# Patient Record
Sex: Male | Born: 1955 | Race: Black or African American | Hispanic: No | Smoking: Former smoker
Health system: Southern US, Community
[De-identification: ages and names within clinical notes are randomized; demographics above are authoritative.]

## PROBLEM LIST (undated history)

## (undated) DIAGNOSIS — M25569 Pain in unspecified knee: Secondary | ICD-10-CM

## (undated) DIAGNOSIS — N189 Chronic kidney disease, unspecified: Secondary | ICD-10-CM

## (undated) DIAGNOSIS — G4730 Sleep apnea, unspecified: Secondary | ICD-10-CM

## (undated) DIAGNOSIS — I10 Essential (primary) hypertension: Secondary | ICD-10-CM

## (undated) DIAGNOSIS — M549 Dorsalgia, unspecified: Secondary | ICD-10-CM

## (undated) DIAGNOSIS — E119 Type 2 diabetes mellitus without complications: Secondary | ICD-10-CM

## (undated) DIAGNOSIS — M13849 Other specified arthritis, unspecified hand: Secondary | ICD-10-CM

## (undated) DIAGNOSIS — I350 Nonrheumatic aortic (valve) stenosis: Secondary | ICD-10-CM

## (undated) DIAGNOSIS — R0681 Apnea, not elsewhere classified: Secondary | ICD-10-CM

## (undated) DIAGNOSIS — I499 Cardiac arrhythmia, unspecified: Secondary | ICD-10-CM

## (undated) DIAGNOSIS — K219 Gastro-esophageal reflux disease without esophagitis: Secondary | ICD-10-CM

## (undated) DIAGNOSIS — D649 Anemia, unspecified: Secondary | ICD-10-CM

## (undated) DIAGNOSIS — G8929 Other chronic pain: Secondary | ICD-10-CM

## (undated) DIAGNOSIS — J45909 Unspecified asthma, uncomplicated: Secondary | ICD-10-CM

## (undated) DIAGNOSIS — G5600 Carpal tunnel syndrome, unspecified upper limb: Secondary | ICD-10-CM

## (undated) HISTORY — PX: BACK SURGERY: SHX140

---

## 2014-06-08 ENCOUNTER — Emergency Department (HOSPITAL_BASED_OUTPATIENT_CLINIC_OR_DEPARTMENT_OTHER)
Admission: EM | Admit: 2014-06-08 | Discharge: 2014-06-08 | Disposition: A | Discharge status: Home / Self Care | Payer: Medicare HMO | Attending: Emergency Medicine | Admitting: Emergency Medicine

## 2014-06-08 ENCOUNTER — Emergency Department (HOSPITAL_BASED_OUTPATIENT_CLINIC_OR_DEPARTMENT_OTHER): Payer: Medicare HMO

## 2014-06-08 ENCOUNTER — Encounter (HOSPITAL_BASED_OUTPATIENT_CLINIC_OR_DEPARTMENT_OTHER): Payer: Self-pay | Admitting: Emergency Medicine

## 2014-06-08 DIAGNOSIS — R079 Chest pain, unspecified: Secondary | ICD-10-CM | POA: Insufficient documentation

## 2014-06-08 DIAGNOSIS — R059 Cough, unspecified: Secondary | ICD-10-CM | POA: Insufficient documentation

## 2014-06-08 DIAGNOSIS — G8929 Other chronic pain: Secondary | ICD-10-CM | POA: Diagnosis not present

## 2014-06-08 DIAGNOSIS — R0602 Shortness of breath: Secondary | ICD-10-CM | POA: Diagnosis not present

## 2014-06-08 DIAGNOSIS — R05 Cough: Secondary | ICD-10-CM | POA: Insufficient documentation

## 2014-06-08 DIAGNOSIS — M7989 Other specified soft tissue disorders: Secondary | ICD-10-CM | POA: Insufficient documentation

## 2014-06-08 DIAGNOSIS — E119 Type 2 diabetes mellitus without complications: Secondary | ICD-10-CM | POA: Diagnosis not present

## 2014-06-08 DIAGNOSIS — J159 Unspecified bacterial pneumonia: Secondary | ICD-10-CM | POA: Diagnosis not present

## 2014-06-08 DIAGNOSIS — R51 Headache: Secondary | ICD-10-CM | POA: Diagnosis not present

## 2014-06-08 DIAGNOSIS — I10 Essential (primary) hypertension: Secondary | ICD-10-CM | POA: Diagnosis not present

## 2014-06-08 DIAGNOSIS — J189 Pneumonia, unspecified organism: Secondary | ICD-10-CM

## 2014-06-08 HISTORY — DX: Type 2 diabetes mellitus without complications: E11.9

## 2014-06-08 HISTORY — DX: Dorsalgia, unspecified: M54.9

## 2014-06-08 HISTORY — DX: Apnea, not elsewhere classified: R06.81

## 2014-06-08 HISTORY — DX: Other chronic pain: G89.29

## 2014-06-08 HISTORY — DX: Essential (primary) hypertension: I10

## 2014-06-08 HISTORY — DX: Pain in unspecified knee: M25.569

## 2014-06-08 LAB — CBC WITH DIFFERENTIAL/PLATELET
Basophils Absolute: 0 10*3/uL (ref 0.0–0.1)
Basophils Relative: 0 % (ref 0–1)
Eosinophils Absolute: 0.4 10*3/uL (ref 0.0–0.7)
Eosinophils Relative: 4 % (ref 0–5)
HCT: 40.4 % (ref 39.0–52.0)
Hemoglobin: 12.5 g/dL — ABNORMAL LOW (ref 13.0–17.0)
LYMPHS ABS: 1.1 10*3/uL (ref 0.7–4.0)
Lymphocytes Relative: 13 % (ref 12–46)
MCH: 27.3 pg (ref 26.0–34.0)
MCHC: 30.9 g/dL (ref 30.0–36.0)
MCV: 88.2 fL (ref 78.0–100.0)
MONOS PCT: 7 % (ref 3–12)
Monocytes Absolute: 0.6 10*3/uL (ref 0.1–1.0)
NEUTROS ABS: 6.6 10*3/uL (ref 1.7–7.7)
Neutrophils Relative %: 76 % (ref 43–77)
PLATELETS: 275 10*3/uL (ref 150–400)
RBC: 4.58 MIL/uL (ref 4.22–5.81)
RDW: 14 % (ref 11.5–15.5)
WBC: 8.7 10*3/uL (ref 4.0–10.5)

## 2014-06-08 LAB — BASIC METABOLIC PANEL
ANION GAP: 13 (ref 5–15)
BUN: 14 mg/dL (ref 6–23)
CHLORIDE: 96 meq/L (ref 96–112)
CO2: 32 mEq/L (ref 19–32)
Calcium: 9.7 mg/dL (ref 8.4–10.5)
Creatinine, Ser: 0.8 mg/dL (ref 0.50–1.35)
GFR calc non Af Amer: >90 mL/min (ref >90)
Glucose, Bld: 121 mg/dL — ABNORMAL HIGH (ref 70–99)
Potassium: 3.8 mEq/L (ref 3.7–5.3)
SODIUM: 141 meq/L (ref 137–147)

## 2014-06-08 LAB — PRO B NATRIURETIC PEPTIDE: PRO B NATRI PEPTIDE: 54.4 pg/mL (ref 0–125)

## 2014-06-08 LAB — TROPONIN I

## 2014-06-08 MED ORDER — LEVOFLOXACIN 750 MG PO TABS
750.0000 mg | ORAL_TABLET | Freq: Once | ORAL | Status: AC
  Administered 2014-06-08: 750 mg via ORAL

## 2014-06-08 MED ORDER — LEVOFLOXACIN 750 MG PO TABS: 750.0000 mg | ORAL_TABLET | Freq: Every day | ORAL | Status: DC

## 2014-06-08 MED ORDER — LEVOFLOXACIN 750 MG PO TABS
ORAL_TABLET | ORAL | Status: DC
Start: 2014-06-08 — End: 2014-06-08
  Filled 2014-06-08: qty 1

## 2014-06-08 MED ORDER — IPRATROPIUM-ALBUTEROL 0.5-2.5 (3) MG/3ML IN SOLN
3.0000 mL | Freq: Once | RESPIRATORY_TRACT | Status: AC
  Administered 2014-06-08: 3 mL via RESPIRATORY_TRACT
  Filled 2014-06-08: qty 3

## 2014-06-08 NOTE — ED Provider Notes (Signed)
CSN: [id number]     Arrival date & time 06/08/14  1243 History   First MD Initiated Contact with Patient 06/08/14 1358     Chief Complaint  Patient presents with  . Cough     (Consider location/radiation/quality/duration/timing/severity/associated sxs/prior Treatment) HPI 58 year old male presents with cough for the past 3 days. He's been having headache and muscle aches as well. He started having shortness of breath today. Some orthopnea last night. He has chronic lower extremity swelling but is not worse. No fevers or chills. He started having chest pain when coughing. No chest pain without coughing. This feels similar to when he had pneumonia in the past.   Past Medical History  Diagnosis Date  . Diabetes mellitus without complication   . Apnea   . Chronic back pain   . Hypertension   . Chronic knee pain    Past Surgical History  Procedure Laterality Date  . Back surgery     No family history on file. History  Substance Use Topics  . Smoking status: Never Smoker   . Smokeless tobacco: Not on file  . Alcohol Use: No    Review of Systems  Constitutional: Negative for fever and chills.  Respiratory: Positive for cough and shortness of breath. Negative for wheezing.   Cardiovascular: Positive for chest pain and leg swelling.  Gastrointestinal: Negative for nausea and abdominal pain.  Neurological: Positive for headaches.  All other systems reviewed and are negative.     Allergies  Chocolate; Fish allergy; and Sulfa antibiotics  Home Medications   Prior to Admission medications   Not on File   BP 159/86  Pulse 100  Temp(Src) 98.4 F (36.9 C) (Oral)  Resp 24  Ht 6' (1.829 m)  Wt 347 lb (157.398 kg)  BMI 47.05 kg/m2  SpO2 92% Physical Exam  Nursing note and vitals reviewed. Constitutional: He is oriented to person, place, and time. He appears well-developed and well-nourished.  Morbidly obese  HENT:  Head: Normocephalic and atraumatic.  Right Ear:  External ear normal.  Left Ear: External ear normal.  Nose: Nose normal.  Eyes: Right eye exhibits no discharge. Left eye exhibits no discharge.  Neck: Neck supple.  Cardiovascular: Normal rate, regular rhythm, normal heart sounds and intact distal pulses.   Pulmonary/Chest: Effort normal. He has no wheezes. He has rales (bibasilar rales - mild).  Abdominal: Soft. He exhibits no distension. There is no tenderness.  Musculoskeletal: He exhibits no edema (no pitting edema).  Neurological: He is alert and oriented to person, place, and time.  Skin: Skin is warm and dry.    ED Course  Procedures (including critical care time) Labs Review Labs Reviewed  CBC WITH DIFFERENTIAL - Abnormal; Notable for the following:    Hemoglobin 12.5 (*)    All other components within normal limits  BASIC METABOLIC PANEL - Abnormal; Notable for the following:    Glucose, Bld 121 (*)    All other components within normal limits  TROPONIN I  PRO B NATRIURETIC PEPTIDE    Imaging Review Dg Chest 2 View  06/08/2014   CLINICAL DATA:  Productive cough, chest pain, shortness of breath  EXAM: CHEST - 2 VIEW  COMPARISON:  None.  FINDINGS: Mild cardiac enlargement with central vascular congestion and basilar atelectasis. Stable pleural thickening bilaterally. No new edema, focal pneumonia, collapse or consolidation. No effusion. Stable pleural thickening in the apices. Trachea is midline.  IMPRESSION: Stable chest exam.  No superimposed acute process.   Electronically Signed  By: Ruel Favors M.D.   On: 06/08/2014 13:48     EKG Interpretation   Date/Time:  Sunday June 08 2014 14:31:58 EDT Ventricular Rate:  102 PR Interval:  160 QRS Duration: 74 QT Interval:  352 QTC Calculation: 458 R Axis:   57 Text Interpretation:  Sinus tachycardia Nonspecific T wave abnormality  Abnormal ECG No old tracing to compare Confirmed by Micaila Ziemba  MD, Makale Pindell  ([id number]) on 06/08/2014 3:02:31 PM      MDM   Final diagnoses:   Community acquired pneumonia    Patient with mild bibasilar Rales. Clinically he appears to have been acquired pneumonia. His chest pain appears to be of chest wall etiology from his coughing. No evidence of heart failure. He has borderline oxygen saturations between 92-95%. Seems consistent with his pneumonia. He has no increased work of breathing. Albuterol was tried without any relief due to his prior history of asthma. At this point he is well-appearing and I feel he is stable for outpatient management and advised of strict return precautions.    Audree Camel, MD 06/08/14 [phone]

## 2014-06-08 NOTE — Discharge Instructions (Signed)

## 2014-06-08 NOTE — ED Notes (Signed)
Pt here for productive cough x3 days with clear sputum.  Pt denies any fever or chills.  Pt reports sob and HA with this as well as CP when coughing.  Has ble edema but states that this is normal for him.

## 2014-06-08 NOTE — ED Notes (Signed)
Pt was 87% on RA after walking to room and tells me that he cant lay down due to sob!

## 2020-08-15 ENCOUNTER — Emergency Department (HOSPITAL_BASED_OUTPATIENT_CLINIC_OR_DEPARTMENT_OTHER): Payer: Medicare HMO

## 2020-08-15 ENCOUNTER — Inpatient Hospital Stay (HOSPITAL_BASED_OUTPATIENT_CLINIC_OR_DEPARTMENT_OTHER)
Admission: EM | Admit: 2020-08-15 | Discharge: 2020-08-23 | DRG: 286 | Disposition: A | Discharge status: Home / Self Care | Payer: Medicare HMO | Attending: Internal Medicine | Admitting: Internal Medicine

## 2020-08-15 ENCOUNTER — Encounter (HOSPITAL_BASED_OUTPATIENT_CLINIC_OR_DEPARTMENT_OTHER): Payer: Self-pay | Admitting: *Deleted

## 2020-08-15 ENCOUNTER — Other Ambulatory Visit: Payer: Self-pay

## 2020-08-15 DIAGNOSIS — N179 Acute kidney failure, unspecified: Secondary | ICD-10-CM | POA: Diagnosis present

## 2020-08-15 DIAGNOSIS — E785 Hyperlipidemia, unspecified: Secondary | ICD-10-CM | POA: Diagnosis present

## 2020-08-15 DIAGNOSIS — Z882 Allergy status to sulfonamides status: Secondary | ICD-10-CM

## 2020-08-15 DIAGNOSIS — N141 Nephropathy induced by other drugs, medicaments and biological substances: Secondary | ICD-10-CM | POA: Diagnosis present

## 2020-08-15 DIAGNOSIS — I130 Hypertensive heart and chronic kidney disease with heart failure and stage 1 through stage 4 chronic kidney disease, or unspecified chronic kidney disease: Secondary | ICD-10-CM | POA: Diagnosis not present

## 2020-08-15 DIAGNOSIS — I342 Nonrheumatic mitral (valve) stenosis: Secondary | ICD-10-CM

## 2020-08-15 DIAGNOSIS — J45901 Unspecified asthma with (acute) exacerbation: Secondary | ICD-10-CM | POA: Diagnosis present

## 2020-08-15 DIAGNOSIS — D638 Anemia in other chronic diseases classified elsewhere: Secondary | ICD-10-CM | POA: Diagnosis present

## 2020-08-15 DIAGNOSIS — K219 Gastro-esophageal reflux disease without esophagitis: Secondary | ICD-10-CM | POA: Diagnosis present

## 2020-08-15 DIAGNOSIS — Z23 Encounter for immunization: Secondary | ICD-10-CM

## 2020-08-15 DIAGNOSIS — N183 Chronic kidney disease, stage 3 unspecified: Secondary | ICD-10-CM

## 2020-08-15 DIAGNOSIS — Z20822 Contact with and (suspected) exposure to covid-19: Secondary | ICD-10-CM | POA: Diagnosis present

## 2020-08-15 DIAGNOSIS — Y929 Unspecified place or not applicable: Secondary | ICD-10-CM

## 2020-08-15 DIAGNOSIS — Z7984 Long term (current) use of oral hypoglycemic drugs: Secondary | ICD-10-CM

## 2020-08-15 DIAGNOSIS — Z952 Presence of prosthetic heart valve: Secondary | ICD-10-CM

## 2020-08-15 DIAGNOSIS — J9601 Acute respiratory failure with hypoxia: Principal | ICD-10-CM | POA: Diagnosis present

## 2020-08-15 DIAGNOSIS — I350 Nonrheumatic aortic (valve) stenosis: Secondary | ICD-10-CM | POA: Diagnosis present

## 2020-08-15 DIAGNOSIS — G4733 Obstructive sleep apnea (adult) (pediatric): Secondary | ICD-10-CM | POA: Diagnosis present

## 2020-08-15 DIAGNOSIS — N1831 Chronic kidney disease, stage 3a: Secondary | ICD-10-CM | POA: Diagnosis present

## 2020-08-15 DIAGNOSIS — I5031 Acute diastolic (congestive) heart failure: Secondary | ICD-10-CM | POA: Diagnosis present

## 2020-08-15 DIAGNOSIS — I2510 Atherosclerotic heart disease of native coronary artery without angina pectoris: Secondary | ICD-10-CM | POA: Diagnosis present

## 2020-08-15 DIAGNOSIS — I214 Non-ST elevation (NSTEMI) myocardial infarction: Secondary | ICD-10-CM

## 2020-08-15 DIAGNOSIS — Z91013 Allergy to seafood: Secondary | ICD-10-CM

## 2020-08-15 DIAGNOSIS — Z79899 Other long term (current) drug therapy: Secondary | ICD-10-CM

## 2020-08-15 DIAGNOSIS — I272 Pulmonary hypertension, unspecified: Secondary | ICD-10-CM | POA: Diagnosis present

## 2020-08-15 DIAGNOSIS — M170 Bilateral primary osteoarthritis of knee: Secondary | ICD-10-CM | POA: Diagnosis present

## 2020-08-15 DIAGNOSIS — Z7982 Long term (current) use of aspirin: Secondary | ICD-10-CM

## 2020-08-15 DIAGNOSIS — A419 Sepsis, unspecified organism: Secondary | ICD-10-CM

## 2020-08-15 DIAGNOSIS — Z9989 Dependence on other enabling machines and devices: Secondary | ICD-10-CM

## 2020-08-15 DIAGNOSIS — R0602 Shortness of breath: Secondary | ICD-10-CM

## 2020-08-15 DIAGNOSIS — J9811 Atelectasis: Secondary | ICD-10-CM | POA: Diagnosis present

## 2020-08-15 DIAGNOSIS — E0590 Thyrotoxicosis, unspecified without thyrotoxic crisis or storm: Secondary | ICD-10-CM | POA: Diagnosis present

## 2020-08-15 DIAGNOSIS — E1122 Type 2 diabetes mellitus with diabetic chronic kidney disease: Secondary | ICD-10-CM | POA: Diagnosis present

## 2020-08-15 DIAGNOSIS — Z6841 Body Mass Index (BMI) 40.0 and over, adult: Secondary | ICD-10-CM

## 2020-08-15 DIAGNOSIS — Z951 Presence of aortocoronary bypass graft: Secondary | ICD-10-CM

## 2020-08-15 DIAGNOSIS — E860 Dehydration: Secondary | ICD-10-CM | POA: Diagnosis present

## 2020-08-15 DIAGNOSIS — J441 Chronic obstructive pulmonary disease with (acute) exacerbation: Secondary | ICD-10-CM | POA: Diagnosis present

## 2020-08-15 DIAGNOSIS — T508X5A Adverse effect of diagnostic agents, initial encounter: Secondary | ICD-10-CM | POA: Diagnosis present

## 2020-08-15 HISTORY — DX: Sleep apnea, unspecified: G47.30

## 2020-08-15 LAB — BASIC METABOLIC PANEL
Anion gap: 11 (ref 5–15)
BUN: 17 mg/dL (ref 8–23)
CO2: 29 mmol/L (ref 22–32)
Calcium: 9.2 mg/dL (ref 8.9–10.3)
Chloride: 100 mmol/L (ref 98–111)
Creatinine, Ser: 1.38 mg/dL — ABNORMAL HIGH (ref 0.61–1.24)
GFR, Estimated: 56 mL/min — ABNORMAL LOW (ref >60)
Glucose, Bld: 120 mg/dL — ABNORMAL HIGH (ref 70–99)
Potassium: 4.4 mmol/L (ref 3.5–5.1)
Sodium: 140 mmol/L (ref 135–145)

## 2020-08-15 LAB — CBC WITH DIFFERENTIAL/PLATELET
Abs Immature Granulocytes: 0.02 10*3/uL (ref 0.00–0.07)
Basophils Absolute: 0.1 10*3/uL (ref 0.0–0.1)
Basophils Relative: 1 %
Eosinophils Absolute: 0.1 10*3/uL (ref 0.0–0.5)
Eosinophils Relative: 1 %
HCT: 34 % — ABNORMAL LOW (ref 39.0–52.0)
Hemoglobin: 10.1 g/dL — ABNORMAL LOW (ref 13.0–17.0)
Immature Granulocytes: 0 %
Lymphocytes Relative: 14 %
Lymphs Abs: 1.2 10*3/uL (ref 0.7–4.0)
MCH: 25.1 pg — ABNORMAL LOW (ref 26.0–34.0)
MCHC: 29.7 g/dL — ABNORMAL LOW (ref 30.0–36.0)
MCV: 84.4 fL (ref 80.0–100.0)
Monocytes Absolute: 0.8 10*3/uL (ref 0.1–1.0)
Monocytes Relative: 10 %
Neutro Abs: 6.2 10*3/uL (ref 1.7–7.7)
Neutrophils Relative %: 74 %
Platelets: 306 10*3/uL (ref 150–400)
RBC: 4.03 MIL/uL — ABNORMAL LOW (ref 4.22–5.81)
RDW: 15.4 % (ref 11.5–15.5)
WBC: 8.4 10*3/uL (ref 4.0–10.5)
nRBC: 0 % (ref 0.0–0.2)

## 2020-08-15 LAB — RESPIRATORY PANEL BY RT PCR (FLU A&B, COVID)
Influenza A by PCR: NEGATIVE
Influenza B by PCR: NEGATIVE
SARS Coronavirus 2 by RT PCR: NEGATIVE

## 2020-08-15 LAB — HEPATIC FUNCTION PANEL
ALT: 16 U/L (ref 0–44)
AST: 17 U/L (ref 15–41)
Albumin: 3.7 g/dL (ref 3.5–5.0)
Alkaline Phosphatase: 50 U/L (ref 38–126)
Bilirubin, Direct: 0.1 mg/dL (ref 0.0–0.2)
Indirect Bilirubin: 0.3 mg/dL (ref 0.3–0.9)
Total Bilirubin: 0.4 mg/dL (ref 0.3–1.2)
Total Protein: 7.7 g/dL (ref 6.5–8.1)

## 2020-08-15 LAB — BRAIN NATRIURETIC PEPTIDE: B Natriuretic Peptide: 152.3 pg/mL — ABNORMAL HIGH (ref 0.0–100.0)

## 2020-08-15 LAB — TROPONIN I (HIGH SENSITIVITY)
Troponin I (High Sensitivity): 350 ng/L (ref <18)
Troponin I (High Sensitivity): 402 ng/L (ref <18)

## 2020-08-15 MED ORDER — HEPARIN BOLUS VIA INFUSION
4000.0000 [IU] | Freq: Once | INTRAVENOUS | Status: AC
  Administered 2020-08-15: 4000 [IU] via INTRAVENOUS

## 2020-08-15 MED ORDER — ASPIRIN 81 MG PO CHEW
324.0000 mg | CHEWABLE_TABLET | Freq: Once | ORAL | Status: AC
  Administered 2020-08-15: 324 mg via ORAL
  Filled 2020-08-15: qty 4

## 2020-08-15 MED ORDER — FUROSEMIDE 10 MG/ML IJ SOLN
20.0000 mg | Freq: Once | INTRAMUSCULAR | Status: AC
  Administered 2020-08-15: 20 mg via INTRAVENOUS
  Filled 2020-08-15: qty 2

## 2020-08-15 MED ORDER — SODIUM CHLORIDE 0.9 % IV SOLN
INTRAVENOUS | Status: DC | PRN
  Administered 2020-08-15 – 2020-08-16 (×2): 500 mL via INTRAVENOUS

## 2020-08-15 MED ORDER — HEPARIN (PORCINE) 25000 UT/250ML-% IV SOLN
1300.0000 [IU]/h | INTRAVENOUS | Status: DC
  Administered 2020-08-15 – 2020-08-18 (×3): 1350 [IU]/h via INTRAVENOUS
  Filled 2020-08-15 (×3): qty 250

## 2020-08-15 MED ORDER — METHYLPREDNISOLONE SODIUM SUCC 125 MG IJ SOLR
125.0000 mg | Freq: Once | INTRAMUSCULAR | Status: AC
  Administered 2020-08-15: 125 mg via INTRAVENOUS
  Filled 2020-08-15: qty 2

## 2020-08-15 MED ORDER — ACETAMINOPHEN 325 MG PO TABS
650.0000 mg | ORAL_TABLET | Freq: Once | ORAL | Status: AC
  Administered 2020-08-15: 650 mg via ORAL
  Filled 2020-08-15: qty 2

## 2020-08-15 MED ORDER — HEPARIN SODIUM (PORCINE) 5000 UNIT/ML IJ SOLN: 4000.0000 [IU] | Freq: Once | INTRAMUSCULAR | Status: DC

## 2020-08-15 MED ORDER — IOHEXOL 350 MG/ML SOLN
100.0000 mL | Freq: Once | INTRAVENOUS | Status: AC | PRN
  Administered 2020-08-15: 100 mL via INTRAVENOUS

## 2020-08-15 MED ORDER — IPRATROPIUM-ALBUTEROL 0.5-2.5 (3) MG/3ML IN SOLN
3.0000 mL | Freq: Once | RESPIRATORY_TRACT | Status: AC
  Administered 2020-08-15: 3 mL via RESPIRATORY_TRACT
  Filled 2020-08-15: qty 3

## 2020-08-15 NOTE — ED Notes (Signed)
Dr. Lockie Mola aware of a troponin of 350.

## 2020-08-15 NOTE — ED Notes (Signed)
Dr. Lockie Mola aware of troponin level- 402

## 2020-08-15 NOTE — ED Provider Notes (Signed)
[facility] EMERGENCY DEPARTMENT Provider Note   CSN: [id number] Arrival date & time: 08/15/20  1700     History Chief Complaint  Patient presents with  . Shortness of Breath    Lawrence Rose is a 64 y.o. male.  The history is provided by the patient.  Shortness of Breath Severity:  Mild Onset quality:  Gradual Timing:  Constant Progression:  Unchanged Chronicity:  New Context: activity   Relieved by:  Nothing Worsened by:  Exertion Associated symptoms: wheezing   Associated symptoms: no abdominal pain, no chest pain, no claudication, no cough, no ear pain, no fever, no rash, no sore throat and no vomiting   Risk factors comment:  HTN, diabetes      Past Medical History:  Diagnosis Date  . Apnea   . Chronic back pain   . Chronic knee pain   . Diabetes mellitus without complication ([place])   . Hypertension     Patient Active Problem List   Diagnosis Date Noted  . NSTEMI (non-ST elevated myocardial infarction) ([place]) 08/15/2020    Past Surgical History:  Procedure Laterality Date  . BACK SURGERY         No family history on file.  Social History   Tobacco Use  . Smoking status: Never Smoker  . Smokeless tobacco: Never Used  Substance Use Topics  . Alcohol use: No  . Drug use: Not Currently    Home Medications Prior to Admission medications   Medication Sig Start Date End Date Taking? Authorizing Provider  albuterol (VENTOLIN HFA) 108 (90 Base) MCG/ACT inhaler Inhale into the lungs. 08/22/16  Yes [provider]  Blood Glucose Monitoring Suppl (FIFTY50 GLUCOSE METER 2.0) w/Device KIT See admin instructions. 05/16/19  Yes [provider]  oxyCODONE ER (XTAMPZA ER) 9 MG C12A Take 9 mg by mouth every 12 (twelve) hours.   Yes [provider]  aspirin 81 MG chewable tablet Chew by mouth.    [provider]  Cholecalciferol 25 MCG (1000 UT) tablet Take by mouth.    [provider]  cloNIDine  (CATAPRES) 0.1 MG tablet  06/15/20   [provider]  diltiazem (CARDIZEM CD) 240 MG 24 hr capsule Take 240 mg by mouth daily. 07/07/20   [provider]  diphenhydrAMINE (SOMINEX) 25 MG tablet Take by mouth.    [provider]  folic acid (FOLVITE) 1 MG tablet Take 1 mg by mouth daily. 07/10/20   [provider]  furosemide (LASIX) 40 MG tablet Take 40 mg by mouth daily. 06/18/20   [provider]  gabapentin (NEURONTIN) 300 MG capsule Take by mouth. 08/12/20   [provider]  HYDROcodone-acetaminophen [facility]) 10-325 MG tablet  08/04/20   [provider]  latanoprost (XALATAN) 0.005 % ophthalmic solution SMARTSIG:In Eye(s) 07/27/20   [provider]  levofloxacin (LEVAQUIN) 750 MG tablet Take 1 tablet (750 mg total) by mouth daily. X 7 days 06/08/14   Sherwood Gambler, MD  lisinopril (ZESTRIL) 40 MG tablet Take 40 mg by mouth daily. 05/25/20   [provider]  metFORMIN (GLUCOPHAGE) 1000 MG tablet Take 1,000 mg by mouth 2 (two) times daily. 07/10/20   [provider]  pantoprazole (PROTONIX) 40 MG tablet Take 40 mg by mouth 2 (two) times daily. 05/26/20   [provider]  potassium chloride SA (KLOR-CON) 20 MEQ tablet Take 20 mEq by mouth daily. 07/07/20   [provider]  rosuvastatin (CRESTOR) 20 MG tablet Take 20 mg by  mouth at bedtime. 05/25/20   [provider]    Allergies    Chocolate, Fish allergy, and Sulfa antibiotics  Review of Systems   Review of Systems  Constitutional: Negative for chills and fever.  HENT: Negative for ear pain and sore throat.   Eyes: Negative for pain and visual disturbance.  Respiratory: Positive for shortness of breath and wheezing. Negative for cough.   Cardiovascular: Positive for palpitations. Negative for chest pain and claudication.  Gastrointestinal: Negative for abdominal pain and vomiting.  Genitourinary: Negative for dysuria and hematuria.   Musculoskeletal: Negative for arthralgias and back pain.  Skin: Negative for color change and rash.  Neurological: Negative for seizures and syncope.  All other systems reviewed and are negative.   Physical Exam Updated Vital Signs  ED Triage Vitals  Enc Vitals Group     BP 08/15/20 1714 (!) 151/77     Pulse Rate 08/15/20 1714 (!) 102     Resp 08/15/20 1714 18     Temp 08/15/20 1714 99.4 F (37.4 C)     Temp Source 08/15/20 1714 Oral     SpO2 08/15/20 1714 (!) 87 %     Weight 08/15/20 1720 (!) 320 lb (145.2 kg)     Height 08/15/20 1720 '5\' 11"'  (1.803 m)     Head Circumference --      Peak Flow --      Pain Score 08/15/20 1720 0     Pain Loc --      Pain Edu? --      Excl. in [place]? --     Physical Exam Vitals and nursing note reviewed.  Constitutional:      General: He is not in acute distress.    Appearance: He is well-developed. He is obese.  HENT:     Head: Normocephalic and atraumatic.  Eyes:     Conjunctiva/sclera: Conjunctivae normal.     Pupils: Pupils are equal, round, and reactive to light.  Cardiovascular:     Rate and Rhythm: Normal rate and regular rhythm.     Pulses: Normal pulses.     Heart sounds: Normal heart sounds. No murmur heard.   Pulmonary:     Effort: Pulmonary effort is normal. Tachypnea present. No respiratory distress.     Breath sounds: Decreased breath sounds and wheezing (mild, scattered) present.  Abdominal:     Palpations: Abdomen is soft.     Tenderness: There is no abdominal tenderness.  Musculoskeletal:     Cervical back: Normal range of motion and neck supple.     Right lower leg: Edema (2+) present.     Left lower leg: Edema (2+) present.  Skin:    General: Skin is warm and dry.     Capillary Refill: Capillary refill takes less than 2 seconds.  Neurological:     General: No focal deficit present.     Mental Status: He is alert.     ED Results / Procedures / Treatments   Labs (all labs ordered are listed, but only  abnormal results are displayed) Labs Reviewed  CBC WITH DIFFERENTIAL/PLATELET - Abnormal; Notable for the following components:      Result Value   RBC 4.03 (*)    Hemoglobin 10.1 (*)    HCT 34.0 (*)    MCH 25.1 (*)    MCHC 29.7 (*)    All other components within normal limits  BASIC METABOLIC PANEL - Abnormal; Notable for the following components:   Glucose, Bld 120 (*)  Creatinine, Ser 1.38 (*)    GFR, Estimated 56 (*)    All other components within normal limits  BRAIN NATRIURETIC PEPTIDE - Abnormal; Notable for the following components:   B Natriuretic Peptide 152.3 (*)    All other components within normal limits  TROPONIN I (HIGH SENSITIVITY) - Abnormal; Notable for the following components:   Troponin I (High Sensitivity) 350 (*)    All other components within normal limits  TROPONIN I (HIGH SENSITIVITY) - Abnormal; Notable for the following components:   Troponin I (High Sensitivity) 402 (*)    All other components within normal limits  RESPIRATORY PANEL BY RT PCR (FLU A&B, COVID)  HEPATIC FUNCTION PANEL  HEPARIN LEVEL (UNFRACTIONATED)    EKG EKG Interpretation  Date/Time:  Saturday August 15 2020 17:19:27 EDT Ventricular Rate:  100 PR Interval:    QRS Duration: 72 QT Interval:  337 QTC Calculation: 435 R Axis:   44 Text Interpretation: Sinus tachycardia Probable left atrial enlargement Confirmed by Lennice Sites [phone]) on 08/15/2020 5:26:18 PM   Radiology CT Angio Chest PE W and/or Wo Contrast  Result Date: 08/15/2020 CLINICAL DATA:  Shortness of breath EXAM: CT ANGIOGRAPHY CHEST WITH CONTRAST TECHNIQUE: Multidetector CT imaging of the chest was performed using the standard protocol during bolus administration of intravenous contrast. Multiplanar CT image reconstructions and MIPs were obtained to evaluate the vascular anatomy. CONTRAST:  140m OMNIPAQUE IOHEXOL 350 MG/ML SOLN COMPARISON:  None. FINDINGS: Cardiovascular: No filling defects in the pulmonary  arteries to suggest pulmonary emboli. Aortic atherosclerosis. Calcifications of the aortic valve. Heart is normal size. Mediastinum/Nodes: Scattered small mediastinal lymph nodes, none pathologically enlarged. Trachea and esophagus are unremarkable. Thyroid unremarkable. Lungs/Pleura: Small bilateral pleural effusions. Compressive atelectasis in the lower lobes. Patchy bilateral ground-glass airspace opacities most notable in the upper lobes concerning for pneumonia. Upper Abdomen: Imaging into the upper abdomen demonstrates no acute findings. Musculoskeletal: Chest wall soft tissues are unremarkable. No acute bony abnormality. Review of the MIP images confirms the above findings. IMPRESSION: No evidence of pulmonary embolus. Small bilateral pleural effusions. Compressive atelectasis in the lower lobes. Patchy bilateral ground-glass opacities in the upper lobes concerning for pneumonia. Aortic Atherosclerosis (ICD10-I70.0). Electronically Signed   By: KRolm BaptiseM.D.   On: 08/15/2020 19:54   DG Chest Port 1 View  Result Date: 08/15/2020 CLINICAL DATA:  Shortness of breath with exertion for 1 week. Dry cough. O2 sats 87%. EXAM: PORTABLE CHEST 1 VIEW COMPARISON:  06/08/2014 FINDINGS: Heart size is accentuated by portable technique. There are faint opacities at the lung bases bilaterally, consistent with early infiltrates. No pulmonary edema. IMPRESSION: Suspect bibasilar infiltrates. Electronically Signed   By: ENolon NationsM.D.   On: 08/15/2020 17:44    Procedures .Critical Care Performed by: CLennice Sites DO Authorized by: CLennice Sites DO   Critical care provider statement:    Critical care time (minutes):  45   Critical care was necessary to treat or prevent imminent or life-threatening deterioration of the following conditions:  Cardiac failure   Critical care was time spent personally by me on the following activities:  Blood draw for specimens, development of treatment plan with  patient or surrogate, discussions with consultants, discussions with primary provider, evaluation of patient's response to treatment, examination of patient, obtaining history from patient or surrogate, ordering and performing treatments and interventions, ordering and review of laboratory studies, ordering and review of radiographic studies, re-evaluation of patient's condition, pulse oximetry and review of old charts   I  assumed direction of critical care for this patient from another provider in my specialty: no     (including critical care time)  Medications Ordered in ED Medications  acetaminophen (TYLENOL) tablet 650 mg (has no administration in time range)  aspirin chewable tablet 324 mg (has no administration in time range)  furosemide (LASIX) injection 20 mg (has no administration in time range)  heparin bolus via infusion 4,000 Units (has no administration in time range)  heparin ADULT infusion 100 units/mL (25000 units/219m sodium chloride 0.45%) (has no administration in time range)  ipratropium-albuterol (DUONEB) 0.5-2.5 (3) MG/3ML nebulizer solution 3 mL (3 mLs Nebulization Given 08/15/20 1844)  methylPREDNISolone sodium succinate (SOLU-MEDROL) 125 mg/2 mL injection 125 mg (125 mg Intravenous Given 08/15/20 1929)  iohexol (OMNIPAQUE) 350 MG/ML injection 100 mL (100 mLs Intravenous Contrast Given 08/15/20 1941)    ED Course  I have reviewed the triage vital signs and the nursing notes.  Pertinent labs & imaging results that were available during my care of the patient were reviewed by me and considered in my medical decision making (see chart for details).    MDM Rules/Calculators/A&P                          JHiro Vipondis a 64year old male with history of diabetes, hypertension, asthma who presents to the ED with shortness of breath with exertion for the past week.  Has had a dry cough but no sputum production.  Has been vaccinated against Covid.  Patient with  temperature of 99.4.  Oxygenation rest is 87% on room air that improved with 2 L.  Has some mild wheezing throughout on exam with some coarse breath sounds.  Does appear volume overloaded with some edema in his legs.  Does take Lasix.  Denies any chest pain.  Differential is wide as this could be infectious versus heart failure versus reactive airway process.  Will check lab work including BNP, troponin, chest x-ray.  EKG shows sinus rhythm.  No ischemic changes.  She with no significant leukocytosis.  Rectal temperature normal.  CT scan of the chest showed possible infection but also showed pleural effusions and no PE.  Troponin initially elevated to 350 and repeat was 402.  BNP is 152.  Patient with creatinine overall baseline.  Did not feel much better after breathing treatment and steroids.  Does not have any chest pain.  Stable on 2 L of oxygen.  EKG overall unremarkable.  Overall suspect multifactorial shortness of breath and hypoxia.  Have a low suspicion for ACS despite elevated troponin.  Will touch base with cardiology.  I think likely this is more volume related.  He appears volume overloaded on exam.  He does have pleural effusions and compressive atelectasis on CT scan of his chest.  I feel like this is less likely infectious.  We will also discuss with medicine for admission.  After discussion with cardiology and medicine will start the patient on heparin to treat for ACS.  Will give a small dose of Lasix as well.  To be admitted to medicine for further care.  This chart was dictated using voice recognition software.  Despite best efforts to proofread,  errors can occur which can change the documentation meaning.    Final Clinical Impression(s) / ED Diagnoses Final diagnoses:  SOB (shortness of breath)  Acute respiratory failure with hypoxia (HCC)  [place]    Rx / DC Orders ED  Discharge Orders    None       Lennice Sites, DO 08/15/20  2137

## 2020-08-15 NOTE — ED Triage Notes (Signed)
Pt reports SOB with exertion x 1 week. Dry cough. Pt reports he had J&J covid vaccine earlier this year

## 2020-08-15 NOTE — ED Notes (Signed)
Pt up to bedside to use urinal, stats having increased shortness of breath since methylPREDNISolone sodium succinate injection. MD notified.

## 2020-08-15 NOTE — Progress Notes (Signed)
ANTICOAGULATION CONSULT NOTE - Initial Consult  Pharmacy Consult for heparin Indication: chest pain/ACS  Allergies  Allergen Reactions  . Chocolate   . Fish Allergy   . Sulfa Antibiotics     Patient Measurements: Height: 5\' 11"  (180.3 cm) Weight: (!) 145.2 kg (320 lb) IBW/kg (Calculated) : 75.3 Heparin Dosing Weight: 109.4kg  Vital Signs: Temp: 99.1 F (37.3 C) (10/30 1901) Temp Source: Rectal (10/30 1901) BP: 168/84 (10/30 2000) Pulse Rate: 105 (10/30 2000)  Labs: Recent Labs    08/15/20 1801 08/15/20 1955  HGB 10.1*  --   HCT 34.0*  --   PLT 306  --   CREATININE 1.38*  --   TROPONINIHS 350* 402*    Estimated Creatinine Clearance: 76.9 mL/min (A) (by C-G formula based on SCr of 1.38 mg/dL (H)).   Medical History: Past Medical History:  Diagnosis Date  . Apnea   . Chronic back pain   . Chronic knee pain   . Diabetes mellitus without complication (HCC)   . Hypertension    Assessment: 27 YOM presenting with SOB, elevated troponin, not on anticoagulation PTA.    Goal of Therapy:  Heparin level 0.3-0.7 units/ml Monitor platelets by anticoagulation protocol: Yes   Plan:  Heparin 4000 units IV x 1, and gtt at 1350 units/hr F/u 6 hour heparin level F/u cards eval  69, PharmD Clinical Pharmacist ED Pharmacist Phone # [phone] 08/15/2020 9:33 PM

## 2020-08-16 DIAGNOSIS — Z01810 Encounter for preprocedural cardiovascular examination: Secondary | ICD-10-CM | POA: Diagnosis not present

## 2020-08-16 DIAGNOSIS — J441 Chronic obstructive pulmonary disease with (acute) exacerbation: Secondary | ICD-10-CM | POA: Diagnosis present

## 2020-08-16 DIAGNOSIS — E0590 Thyrotoxicosis, unspecified without thyrotoxic crisis or storm: Secondary | ICD-10-CM | POA: Diagnosis present

## 2020-08-16 DIAGNOSIS — I25110 Atherosclerotic heart disease of native coronary artery with unstable angina pectoris: Secondary | ICD-10-CM | POA: Diagnosis not present

## 2020-08-16 DIAGNOSIS — N179 Acute kidney failure, unspecified: Secondary | ICD-10-CM | POA: Diagnosis present

## 2020-08-16 DIAGNOSIS — J9601 Acute respiratory failure with hypoxia: Secondary | ICD-10-CM | POA: Diagnosis present

## 2020-08-16 DIAGNOSIS — R0602 Shortness of breath: Secondary | ICD-10-CM | POA: Diagnosis present

## 2020-08-16 DIAGNOSIS — G4733 Obstructive sleep apnea (adult) (pediatric): Secondary | ICD-10-CM | POA: Diagnosis not present

## 2020-08-16 DIAGNOSIS — R0902 Hypoxemia: Secondary | ICD-10-CM

## 2020-08-16 DIAGNOSIS — E785 Hyperlipidemia, unspecified: Secondary | ICD-10-CM | POA: Diagnosis present

## 2020-08-16 DIAGNOSIS — I272 Pulmonary hypertension, unspecified: Secondary | ICD-10-CM | POA: Diagnosis present

## 2020-08-16 DIAGNOSIS — I342 Nonrheumatic mitral (valve) stenosis: Secondary | ICD-10-CM | POA: Diagnosis not present

## 2020-08-16 DIAGNOSIS — E860 Dehydration: Secondary | ICD-10-CM | POA: Diagnosis present

## 2020-08-16 DIAGNOSIS — I214 Non-ST elevation (NSTEMI) myocardial infarction: Secondary | ICD-10-CM | POA: Diagnosis not present

## 2020-08-16 DIAGNOSIS — I5031 Acute diastolic (congestive) heart failure: Secondary | ICD-10-CM | POA: Diagnosis present

## 2020-08-16 DIAGNOSIS — J189 Pneumonia, unspecified organism: Secondary | ICD-10-CM | POA: Diagnosis not present

## 2020-08-16 DIAGNOSIS — Z20822 Contact with and (suspected) exposure to covid-19: Secondary | ICD-10-CM | POA: Diagnosis present

## 2020-08-16 DIAGNOSIS — J45901 Unspecified asthma with (acute) exacerbation: Secondary | ICD-10-CM | POA: Diagnosis present

## 2020-08-16 DIAGNOSIS — I350 Nonrheumatic aortic (valve) stenosis: Secondary | ICD-10-CM | POA: Diagnosis not present

## 2020-08-16 DIAGNOSIS — Z6841 Body Mass Index (BMI) 40.0 and over, adult: Secondary | ICD-10-CM | POA: Diagnosis not present

## 2020-08-16 DIAGNOSIS — J9811 Atelectasis: Secondary | ICD-10-CM | POA: Diagnosis present

## 2020-08-16 DIAGNOSIS — D638 Anemia in other chronic diseases classified elsewhere: Secondary | ICD-10-CM | POA: Diagnosis present

## 2020-08-16 DIAGNOSIS — N141 Nephropathy induced by other drugs, medicaments and biological substances: Secondary | ICD-10-CM | POA: Diagnosis present

## 2020-08-16 DIAGNOSIS — M7989 Other specified soft tissue disorders: Secondary | ICD-10-CM | POA: Diagnosis not present

## 2020-08-16 DIAGNOSIS — E1122 Type 2 diabetes mellitus with diabetic chronic kidney disease: Secondary | ICD-10-CM | POA: Diagnosis present

## 2020-08-16 DIAGNOSIS — Z23 Encounter for immunization: Secondary | ICD-10-CM | POA: Diagnosis present

## 2020-08-16 DIAGNOSIS — I130 Hypertensive heart and chronic kidney disease with heart failure and stage 1 through stage 4 chronic kidney disease, or unspecified chronic kidney disease: Secondary | ICD-10-CM | POA: Diagnosis present

## 2020-08-16 DIAGNOSIS — Y929 Unspecified place or not applicable: Secondary | ICD-10-CM | POA: Diagnosis not present

## 2020-08-16 DIAGNOSIS — K219 Gastro-esophageal reflux disease without esophagitis: Secondary | ICD-10-CM | POA: Diagnosis present

## 2020-08-16 DIAGNOSIS — T508X5A Adverse effect of diagnostic agents, initial encounter: Secondary | ICD-10-CM | POA: Diagnosis present

## 2020-08-16 DIAGNOSIS — N1831 Chronic kidney disease, stage 3a: Secondary | ICD-10-CM | POA: Diagnosis present

## 2020-08-16 DIAGNOSIS — I2510 Atherosclerotic heart disease of native coronary artery without angina pectoris: Secondary | ICD-10-CM | POA: Diagnosis present

## 2020-08-16 LAB — HIV ANTIBODY (ROUTINE TESTING W REFLEX): HIV Screen 4th Generation wRfx: NONREACTIVE

## 2020-08-16 LAB — HEPARIN LEVEL (UNFRACTIONATED)
Heparin Unfractionated: 0.62 IU/mL (ref 0.30–0.70)
Heparin Unfractionated: 0.55 IU/mL (ref 0.30–0.70)

## 2020-08-16 LAB — CBC
HCT: 36.1 % — ABNORMAL LOW (ref 39.0–52.0)
Hemoglobin: 10.6 g/dL — ABNORMAL LOW (ref 13.0–17.0)
MCH: 24.6 pg — ABNORMAL LOW (ref 26.0–34.0)
MCHC: 29.4 g/dL — ABNORMAL LOW (ref 30.0–36.0)
MCV: 83.8 fL (ref 80.0–100.0)
Platelets: 340 10*3/uL (ref 150–400)
RBC: 4.31 MIL/uL (ref 4.22–5.81)
RDW: 15.1 % (ref 11.5–15.5)
WBC: 10.8 10*3/uL — ABNORMAL HIGH (ref 4.0–10.5)
nRBC: 0 % (ref 0.0–0.2)

## 2020-08-16 LAB — BLOOD GAS, ARTERIAL
Acid-Base Excess: 5.6 mmol/L — ABNORMAL HIGH (ref 0.0–2.0)
Bicarbonate: 30.7 mmol/L — ABNORMAL HIGH (ref 20.0–28.0)
Drawn by: 53547
FIO2: 28
O2 Saturation: 52.1 %
Patient temperature: 37.1
pCO2 arterial: 54.6 mmHg — ABNORMAL HIGH (ref 32.0–48.0)
pH, Arterial: 7.369 (ref 7.350–7.450)
pO2, Arterial: 31.1 mmHg — CL (ref 83.0–108.0)

## 2020-08-16 LAB — BASIC METABOLIC PANEL
Anion gap: 15 (ref 5–15)
BUN: 22 mg/dL (ref 8–23)
CO2: 29 mmol/L (ref 22–32)
Calcium: 9.5 mg/dL (ref 8.9–10.3)
Chloride: 95 mmol/L — ABNORMAL LOW (ref 98–111)
Creatinine, Ser: 1.41 mg/dL — ABNORMAL HIGH (ref 0.61–1.24)
GFR, Estimated: 55 mL/min — ABNORMAL LOW (ref >60)
Glucose, Bld: 209 mg/dL — ABNORMAL HIGH (ref 70–99)
Potassium: 4.2 mmol/L (ref 3.5–5.1)
Sodium: 139 mmol/L (ref 135–145)

## 2020-08-16 LAB — IRON AND TIBC
Iron: 44 ug/dL — ABNORMAL LOW (ref 45–182)
Saturation Ratios: 15 % — ABNORMAL LOW (ref 17.9–39.5)
TIBC: 294 ug/dL (ref 250–450)
UIBC: 250 ug/dL

## 2020-08-16 LAB — HEMOGLOBIN A1C
Hgb A1c MFr Bld: 5.8 % — ABNORMAL HIGH (ref 4.8–5.6)
Mean Plasma Glucose: 119.76 mg/dL

## 2020-08-16 LAB — GLUCOSE, CAPILLARY: Glucose-Capillary: 161 mg/dL — ABNORMAL HIGH (ref 70–99)

## 2020-08-16 LAB — MAGNESIUM: Magnesium: 2 mg/dL (ref 1.7–2.4)

## 2020-08-16 LAB — TROPONIN I (HIGH SENSITIVITY)
Troponin I (High Sensitivity): 330 ng/L (ref <18)
Troponin I (High Sensitivity): 148 ng/L (ref <18)
Troponin I (High Sensitivity): 164 ng/L (ref <18)
Troponin I (High Sensitivity): 175 ng/L (ref <18)
Troponin I (High Sensitivity): 176 ng/L (ref <18)

## 2020-08-16 LAB — TSH: TSH: 0.176 u[IU]/mL — ABNORMAL LOW (ref 0.350–4.500)

## 2020-08-16 LAB — STREP PNEUMONIAE URINARY ANTIGEN: Strep Pneumo Urinary Antigen: NEGATIVE

## 2020-08-16 LAB — D-DIMER, QUANTITATIVE: D-Dimer, Quant: 1.02 ug/mL-FEU — ABNORMAL HIGH (ref 0.00–0.50)

## 2020-08-16 LAB — T4, FREE: Free T4: 0.84 ng/dL (ref 0.61–1.12)

## 2020-08-16 LAB — BRAIN NATRIURETIC PEPTIDE: B Natriuretic Peptide: 246.2 pg/mL — ABNORMAL HIGH (ref 0.0–100.0)

## 2020-08-16 MED ORDER — ACETAMINOPHEN 325 MG PO TABS: 650.0000 mg | ORAL_TABLET | ORAL | Status: DC | PRN

## 2020-08-16 MED ORDER — ASPIRIN EC 81 MG PO TBEC
81.0000 mg | DELAYED_RELEASE_TABLET | Freq: Every day | ORAL | Status: DC
  Administered 2020-08-17 – 2020-08-23 (×6): 81 mg via ORAL
  Filled 2020-08-16 (×7): qty 1

## 2020-08-16 MED ORDER — METOPROLOL TARTRATE 12.5 MG HALF TABLET
12.5000 mg | ORAL_TABLET | Freq: Two times a day (BID) | ORAL | Status: DC
  Administered 2020-08-16 – 2020-08-18 (×5): 12.5 mg via ORAL
  Filled 2020-08-16 (×5): qty 1

## 2020-08-16 MED ORDER — MORPHINE SULFATE (PF) 2 MG/ML IV SOLN: 2.0000 mg | INTRAVENOUS | Status: DC | PRN

## 2020-08-16 MED ORDER — NITROGLYCERIN 2 % TD OINT
0.5000 [in_us] | TOPICAL_OINTMENT | Freq: Four times a day (QID) | TRANSDERMAL | Status: DC | PRN
  Filled 2020-08-16: qty 30

## 2020-08-16 MED ORDER — ONDANSETRON HCL 4 MG/2ML IJ SOLN: 4.0000 mg | Freq: Four times a day (QID) | INTRAMUSCULAR | Status: DC | PRN

## 2020-08-16 MED ORDER — NITROGLYCERIN 0.4 MG SL SUBL: 0.4000 mg | SUBLINGUAL_TABLET | SUBLINGUAL | Status: DC | PRN

## 2020-08-16 MED ORDER — ROSUVASTATIN CALCIUM 20 MG PO TABS
20.0000 mg | ORAL_TABLET | Freq: Every day | ORAL | Status: DC
  Administered 2020-08-16 – 2020-08-23 (×8): 20 mg via ORAL
  Filled 2020-08-16 (×8): qty 1

## 2020-08-16 MED ORDER — CLONIDINE HCL 0.1 MG PO TABS
0.1000 mg | ORAL_TABLET | Freq: Two times a day (BID) | ORAL | Status: DC
  Administered 2020-08-16 – 2020-08-23 (×14): 0.1 mg via ORAL
  Filled 2020-08-16 (×14): qty 1

## 2020-08-16 MED ORDER — DILTIAZEM HCL ER COATED BEADS 240 MG PO CP24
240.0000 mg | ORAL_CAPSULE | Freq: Every day | ORAL | Status: DC
  Administered 2020-08-17 – 2020-08-23 (×7): 240 mg via ORAL
  Filled 2020-08-16 (×7): qty 1

## 2020-08-16 MED ORDER — PREDNISONE 20 MG PO TABS: 40.0000 mg | ORAL_TABLET | Freq: Every day | ORAL | Status: DC

## 2020-08-16 MED ORDER — SODIUM CHLORIDE 0.9 % IV SOLN
2.0000 g | INTRAVENOUS | Status: DC
  Administered 2020-08-16: 2 g via INTRAVENOUS
  Filled 2020-08-16: qty 20

## 2020-08-16 MED ORDER — PREDNISONE 20 MG PO TABS
40.0000 mg | ORAL_TABLET | Freq: Every day | ORAL | Status: DC
  Administered 2020-08-17: 40 mg via ORAL
  Filled 2020-08-16: qty 2

## 2020-08-16 MED ORDER — INSULIN ASPART 100 UNIT/ML ~~LOC~~ SOLN
0.0000 [IU] | Freq: Three times a day (TID) | SUBCUTANEOUS | Status: DC
  Administered 2020-08-17: 3 [IU] via SUBCUTANEOUS
  Administered 2020-08-17 (×2): 2 [IU] via SUBCUTANEOUS
  Administered 2020-08-18 – 2020-08-19 (×4): 1 [IU] via SUBCUTANEOUS
  Administered 2020-08-19: 2 [IU] via SUBCUTANEOUS
  Administered 2020-08-19: 1 [IU] via SUBCUTANEOUS
  Administered 2020-08-20: 2 [IU] via SUBCUTANEOUS
  Administered 2020-08-20: 3 [IU] via SUBCUTANEOUS
  Administered 2020-08-20: 1 [IU] via SUBCUTANEOUS
  Administered 2020-08-21 – 2020-08-22 (×5): 2 [IU] via SUBCUTANEOUS
  Administered 2020-08-22: 5 [IU] via SUBCUTANEOUS
  Administered 2020-08-23 (×2): 2 [IU] via SUBCUTANEOUS

## 2020-08-16 MED ORDER — LATANOPROST 0.005 % OP SOLN
1.0000 [drp] | Freq: Every day | OPHTHALMIC | Status: DC
  Administered 2020-08-16 – 2020-08-22 (×7): 1 [drp] via OPHTHALMIC
  Filled 2020-08-16: qty 2.5

## 2020-08-16 MED ORDER — LISINOPRIL 40 MG PO TABS
40.0000 mg | ORAL_TABLET | Freq: Every day | ORAL | Status: DC
  Administered 2020-08-16 – 2020-08-21 (×6): 40 mg via ORAL
  Filled 2020-08-16 (×6): qty 1

## 2020-08-16 MED ORDER — GABAPENTIN 300 MG PO CAPS
600.0000 mg | ORAL_CAPSULE | Freq: Two times a day (BID) | ORAL | Status: DC
  Administered 2020-08-16 – 2020-08-23 (×14): 600 mg via ORAL
  Filled 2020-08-16 (×14): qty 2

## 2020-08-16 MED ORDER — IPRATROPIUM-ALBUTEROL 0.5-2.5 (3) MG/3ML IN SOLN
3.0000 mL | Freq: Four times a day (QID) | RESPIRATORY_TRACT | Status: DC | PRN
  Administered 2020-08-16: 3 mL via RESPIRATORY_TRACT
  Filled 2020-08-16: qty 3

## 2020-08-16 MED ORDER — SODIUM CHLORIDE 0.9 % IV SOLN
500.0000 mg | INTRAVENOUS | Status: DC
  Administered 2020-08-16: 500 mg via INTRAVENOUS
  Filled 2020-08-16: qty 500

## 2020-08-16 NOTE — H&P (Signed)
History and Physical    Lawrence Rose FBX:[id number] DOB: [birth year] DOA: 08/15/2020  PCP: Pcp, No  Patient coming from: [facility]  I have personally briefly reviewed patient's old medical records in [place]  Chief Complaint: shortness of breath   HPI: Lawrence Rose is a 64 y.o. male with medical history significant of  Obesity, HTN, HLD , GERD, DJD of knee, DMII, who [facility]  [facility] with complaint of progressive shortness of breath x 1 week. He states that these symptoms having been on going for the better part of a month but more progressive over the last week. He states that with minimal activity such has ambulating to the bathroom he noted significant DOE with at time is associated with palpitations but note with chest pain. He noted no increase lower extremity swelling over the last week. He notes that he recently has lasix cut in half earlier in the month due MD noting signs of dehydration. Patient states he has not noted increase swelling in lower extremities with this change.  He denies any f/c/n/cough/ constipation or diarrhea. He also denies abdominal pain.   ED Course:  99.4, hr 102, bp 151/77, rr 18, sat 87 -82on ra , 94% on 2L [place]  Labs: Wbc 8.4, hgb 10.1 ( 12.5 over 6 years ago ) MCV 84, plt306 Na 140, k 4.4, glu 120, cr 1.38( was 0.8 2015) BNP 152.3 [id number],004 Respiratory screen:negative   EKG: SNR no hyperacute st -twave changes   cxr:There are faint opacities at the lung bases bilaterally, consistent with early infiltrates. No pulmonary edema.  IMPRESSION: Suspect bibasilar infiltrates.  CTPA Small bilateral pleural effusions. Compressive atelectasis in the lower lobes.  Patchy bilateral ground-glass opacities in the upper lobes concerning for pneumonia.  tx :asa, heparin per acs protocol, lasix 20 mg iv   Review of Systems: As per HPI otherwise 10 point review of systems negative.   Past Medical History:   Diagnosis Date  . Apnea   . Chronic back pain   . Chronic knee pain   . Diabetes mellitus without complication ([place])   . Hypertension     Past Surgical History:  Procedure Laterality Date  . BACK SURGERY       reports that he has never smoked. He has never used smokeless tobacco. He reports previous drug use. He reports that he does not drink alcohol.  Allergies  Allergen Reactions  . Chocolate   . Fish Allergy   . Sulfa Antibiotics     No family history on file.  Prior to Admission medications   Medication Sig Start Date End Date Taking? Authorizing Provider  albuterol (VENTOLIN HFA) 108 (90 Base) MCG/ACT inhaler Inhale into the lungs. 08/22/16  Yes [provider]  Blood Glucose Monitoring Suppl (FIFTY50 GLUCOSE METER 2.0) w/Device KIT See admin instructions. 05/16/19  Yes [provider]  oxyCODONE ER (XTAMPZA ER) 9 MG C12A Take 9 mg by mouth every 12 (twelve) hours.   Yes [provider]  aspirin 81 MG chewable tablet Chew by mouth.    [provider]  Cholecalciferol 25 MCG (1000 UT) tablet Take by mouth.    [provider]  cloNIDine (CATAPRES) 0.1 MG tablet  06/15/20   [provider]  diltiazem (CARDIZEM CD) 240 MG 24 hr capsule Take 240 mg by mouth daily. 07/07/20   [provider]  diphenhydrAMINE (SOMINEX) 25 MG tablet Take by mouth.    [provider]  folic acid (FOLVITE) 1 MG tablet Take 1 mg by mouth daily. 07/10/20   [provider]  furosemide (LASIX) 40 MG tablet Take 40 mg by mouth daily. 06/18/20   [provider]  gabapentin (NEURONTIN) 300 MG capsule Take by mouth. 08/12/20   [provider]  HYDROcodone-acetaminophen [facility]) 10-325 MG tablet  08/04/20   [provider]  latanoprost (XALATAN) 0.005 % ophthalmic solution SMARTSIG:In Eye(s) 07/27/20   [provider]  levofloxacin (LEVAQUIN) 750 MG tablet Take 1 tablet (750 mg total) by mouth  daily. X 7 days 06/08/14   Sherwood Gambler, MD  lisinopril (ZESTRIL) 40 MG tablet Take 40 mg by mouth daily. 05/25/20   [provider]  metFORMIN (GLUCOPHAGE) 1000 MG tablet Take 1,000 mg by mouth 2 (two) times daily. 07/10/20   [provider]  pantoprazole (PROTONIX) 40 MG tablet Take 40 mg by mouth 2 (two) times daily. 05/26/20   [provider]  potassium chloride SA (KLOR-CON) 20 MEQ tablet Take 20 mEq by mouth daily. 07/07/20   [provider]  rosuvastatin (CRESTOR) 20 MG tablet Take 20 mg by mouth at bedtime. 05/25/20   [provider]    Physical Exam: Vitals:   08/16/20 1245 08/16/20 1300 08/16/20 1411 08/16/20 1414  BP:   (!) 161/91   Pulse: (!) 113 (!) 110 (!) 110   Resp:   20   Temp:   98.8 F (37.1 C)   TempSrc:   Oral   SpO2: 94% 93% 97%   Weight:    (!) 141.7 kg  Height:    _0  (1.803 m)    Constitutional: NAD, calm, comfortable Vitals:   08/16/20 1245 08/16/20 1300 08/16/20 1411 08/16/20 1414  BP:   (!) 161/91   Pulse: (!) 113 (!) 110 (!) 110   Resp:   20   Temp:   98.8 F (37.1 C)   TempSrc:   Oral   SpO2: 94% 93% 97%   Weight:    (!) 141.7 kg  Height:    _1  (1.803 m)   Eyes: PERRL, lids and conjunctivae normal ENMT: Mucous membranes are moist. Posterior pharynx clear of any exudate or lesions.Normal dentition.  Neck: normal, supple, no masses, no thyromegaly Respiratory: clear to auscultation bilaterally, no wheezing, no crackles. Diminished at bases, Normal respiratory effort. No accessory muscle use.  Cardiovascular: Regular rate and rhythm, + murmurs . no rubs / gallops. trace extremity edema. 2+ pedal pulses. Abdomen:obese no tenderness, no masses palpated. No hepatosplenomegaly. Bowel sounds positive.  Musculoskeletal: no clubbing / cyanosis. No joint deformity upper and lower extremities. Good ROM, no contractures. Normal muscle tone.  Skin: no rashes, lesions, ulcers. No induration Neurologic: CN 2-12  grossly intact. Sensation intact, DTR normal. Strength 5/5 in all 4.  Psychiatric: Normal judgment and insight. Alert and oriented x 3. Normal mood.    Labs on Admission: I have personally reviewed following labs and imaging studies  CBC: Recent Labs  Lab 08/15/20 1801  WBC 8.4  NEUTROABS 6.2  HGB 10.1*  HCT 34.0*  MCV 84.4  PLT 557   Basic Metabolic Panel: Recent Labs  Lab 08/15/20 1801  NA 140  K 4.4  CL 100  CO2 29  GLUCOSE 120*  BUN 17  CREATININE 1.38*  CALCIUM 9.2   GFR: Estimated Creatinine Clearance: 75.9 mL/min (A) (by C-G formula based on SCr of 1.38 mg/dL (H)). Liver Function Tests: Recent Labs  Lab 08/15/20 1801  AST 17  ALT 16  ALKPHOS 50  BILITOT 0.4  PROT 7.7  ALBUMIN 3.7   No results for input(s): LIPASE, AMYLASE in the last 168 hours. No results for input(s): AMMONIA in the last 168 hours. Coagulation Profile: No results for input(s): INR, PROTIME in the last 168 hours. Cardiac Enzymes: No results for input(s): CKTOTAL, CKMB, CKMBINDEX, TROPONINI in the last 168 hours. BNP (last 3 results) No results for input(s): PROBNP in the last 8760 hours. HbA1C: No results for input(s): HGBA1C in the last 72 hours. CBG: No results for input(s): GLUCAP in the last 168 hours. Lipid Profile: No results for input(s): CHOL, HDL, LDLCALC, TRIG, CHOLHDL, LDLDIRECT in the last 72 hours. Thyroid Function Tests: No results for input(s): TSH, T4TOTAL, FREET4, T3FREE, THYROIDAB in the last 72 hours. Anemia Panel: No results for input(s): VITAMINB12, FOLATE, FERRITIN, TIBC, IRON, RETICCTPCT in the last 72 hours. Urine analysis: No results found for: COLORURINE, APPEARANCEUR, LABSPEC, PHURINE, GLUCOSEU, HGBUR, BILIRUBINUR, KETONESUR, PROTEINUR, UROBILINOGEN, NITRITE, LEUKOCYTESUR  Radiological Exams on Admission: CT Angio Chest PE W and/or Wo Contrast  Result Date: 08/15/2020 CLINICAL DATA:  Shortness of breath EXAM: CT ANGIOGRAPHY CHEST WITH CONTRAST  TECHNIQUE: Multidetector CT imaging of the chest was performed using the standard protocol during bolus administration of intravenous contrast. Multiplanar CT image reconstructions and MIPs were obtained to evaluate the vascular anatomy. CONTRAST:  131m OMNIPAQUE IOHEXOL 350 MG/ML SOLN COMPARISON:  None. FINDINGS: Cardiovascular: No filling defects in the pulmonary arteries to suggest pulmonary emboli. Aortic atherosclerosis. Calcifications of the aortic valve. Heart is normal size. Mediastinum/Nodes: Scattered small mediastinal lymph nodes, none pathologically enlarged. Trachea and esophagus are unremarkable. Thyroid unremarkable. Lungs/Pleura: Small bilateral pleural effusions. Compressive atelectasis in the lower lobes. Patchy bilateral ground-glass airspace opacities most notable in the upper lobes concerning for pneumonia. Upper Abdomen: Imaging into the upper abdomen demonstrates no acute findings. Musculoskeletal: Chest wall soft tissues are unremarkable. No acute bony abnormality. Review of the MIP images confirms the above findings. IMPRESSION: No evidence of pulmonary embolus. Small bilateral pleural effusions. Compressive atelectasis in the lower lobes. Patchy bilateral ground-glass opacities in the upper lobes concerning for pneumonia. Aortic Atherosclerosis (ICD10-I70.0). Electronically Signed   By: KRolm BaptiseM.D.   On: 08/15/2020 19:54   DG Chest Port 1 View  Result Date: 08/15/2020 CLINICAL DATA:  Shortness of breath with exertion for 1 week. Dry cough. O2 sats 87%. EXAM: PORTABLE CHEST 1 VIEW COMPARISON:  06/08/2014 FINDINGS: Heart size is accentuated by portable technique. There are faint opacities at the lung bases bilaterally, consistent with early infiltrates. No pulmonary edema. IMPRESSION: Suspect bibasilar infiltrates. Electronically Signed   By: ENolon NationsM.D.   On: 08/15/2020 17:44    EKG: Independently reviewed. See above  Assessment/Plan CAP -start on broad spectrum  antibiotics  -f/u urinary ag/ culture data  -pulmonary toilet  -respiratory panel negative  Possible COPD/Asthma exacerbation  - noted to have wheezing in ed  -s/p solumedrol - currently mild expiratory wheezing  -prednisone 40 mg po daily x 5 days   Acute hypoxic respiratory failure  -due to CAP/mild copd/asthma exacerbation - wean O2 as able  -pulmonary toilet /neb prn   Type II NSTEMI -heparin per cardiology on call  -echo in am  -official cardiology consult to be called in am if echo abnormal per cardiology on call Dr TJanann AugustB/L Pleural effusion  - thought to be due to b/l CAP/no overt CHF  -patient w/o prior diagnosis of CHF -will f/u on echo  -  of note bnp 152 very significant esp in setting of note AKI  -place on strict I/o , daily weights  -current exam note only trace b/l  LE edema   AKI: -hold nephrotoxic medications   -cr 1.38( was 0.8 2015) -repeat labs pending  DMII -check a1c  - place on is/fs   Anemia -IDA - hgb 10.1 ( 12.5 over 6 years ago )  -no hx of of blood in stools  -due to being on heparin will check fob to be complete  -will also check iron panel   OSA -cpap qhs   HTN -resume once med rec has been completed   -resume ace as able   GERD -ppi   FEN Replete electrolytes prn    DVT prophylaxis: heparin  Code Status: full Family Communication:n/a Disposition Plan:  [facility] Consults called: please call cardiology in am s/p echo  Admission status: sdu   Clance Boll MD [facility]  If 7PM-7AM, please contact night-coverage [email or web address] Password [email or web address]  08/16/2020, 2:44 PM

## 2020-08-16 NOTE — ED Notes (Signed)
Dr. Nicanor Alcon aware of the troponin level of 330.

## 2020-08-16 NOTE — Progress Notes (Signed)
ANTICOAGULATION CONSULT NOTE   Pharmacy Consult for heparin Indication: chest pain/ACS   Assessment: 51 YOM presenting with SOB, elevated troponin, not on anticoagulation PTA.   Initial heparin level 0.62 units/ml  Goal of Therapy:  Heparin level 0.3-0.7 units/ml Monitor platelets by anticoagulation protocol: Yes   Plan:  Continue heparin gtt at 1350 units/hr F/u 6 hour heparin level F/u cards eval  Thanks for allowing pharmacy to be a part of this patient's care.  Talbert Cage, PharmD Clinical Pharmacist 08/16/2020 6:54 AM

## 2020-08-16 NOTE — ED Notes (Signed)
I have just phoned report to [place], Charity fundraiser at [facility]. I will call [facility] now for transport.

## 2020-08-16 NOTE — Progress Notes (Signed)
ANTICOAGULATION CONSULT NOTE   Pharmacy Consult for heparin Indication: chest pain/ACS  Allergies  Allergen Reactions  . Chocolate   . Fish Allergy   . Sulfa Antibiotics     Patient Measurements: Height: 5\' 11"  (180.3 cm) Weight: (!) 141.7 kg (312 lb 6.3 oz) IBW/kg (Calculated) : 75.3 Heparin Dosing Weight: 109.4kg  Vital Signs: Temp: 98.8 F (37.1 C) (10/31 1411) Temp Source: Oral (10/31 1411) BP: 161/91 (10/31 1411) Pulse Rate: 110 (10/31 1411)  Labs: Recent Labs    08/15/20 1801 08/15/20 1955 08/15/20 2241 08/16/20 0443 08/16/20 1459  HGB 10.1*  --   --   --  10.6*  HCT 34.0*  --   --   --  36.1*  PLT 306  --   --   --  340  HEPARINUNFRC  --   --   --  0.62 0.55  CREATININE 1.38*  --   --   --   --   TROPONINIHS 350* 402* 330*  --   --     Estimated Creatinine Clearance: 75.9 mL/min (A) (by C-G formula based on SCr of 1.38 mg/dL (H)).   Medical History: Past Medical History:  Diagnosis Date  . Apnea   . Chronic back pain   . Chronic knee pain   . Diabetes mellitus without complication ([facility])   . Hypertension    Assessment: 66 YOM presenting with SOB, elevated troponin, not on anticoagulation PTA.    Heparin level remains therapeutic on 1350 units/hr  Goal of Therapy:  Heparin level 0.3-0.7 units/ml Monitor platelets by anticoagulation protocol: Yes   Plan:  Continue heparin gtt at 1350 units/hr F/u heparin level with AM labs to confirm F/u cards plan  69, PharmD Clinical Pharmacist ED Pharmacist Phone # [phone] 08/16/2020 4:00 PM

## 2020-08-17 ENCOUNTER — Inpatient Hospital Stay (HOSPITAL_COMMUNITY): Payer: Medicare HMO

## 2020-08-17 DIAGNOSIS — J9601 Acute respiratory failure with hypoxia: Secondary | ICD-10-CM | POA: Diagnosis not present

## 2020-08-17 DIAGNOSIS — M7989 Other specified soft tissue disorders: Secondary | ICD-10-CM | POA: Diagnosis not present

## 2020-08-17 DIAGNOSIS — I214 Non-ST elevation (NSTEMI) myocardial infarction: Secondary | ICD-10-CM

## 2020-08-17 DIAGNOSIS — N1831 Chronic kidney disease, stage 3a: Secondary | ICD-10-CM

## 2020-08-17 DIAGNOSIS — A419 Sepsis, unspecified organism: Secondary | ICD-10-CM

## 2020-08-17 DIAGNOSIS — N183 Chronic kidney disease, stage 3 unspecified: Secondary | ICD-10-CM

## 2020-08-17 DIAGNOSIS — I350 Nonrheumatic aortic (valve) stenosis: Secondary | ICD-10-CM

## 2020-08-17 DIAGNOSIS — G4733 Obstructive sleep apnea (adult) (pediatric): Secondary | ICD-10-CM

## 2020-08-17 LAB — T4, FREE: Free T4: 0.77 ng/dL (ref 0.61–1.12)

## 2020-08-17 LAB — CBC
HCT: 33.9 % — ABNORMAL LOW (ref 39.0–52.0)
Hemoglobin: 10.1 g/dL — ABNORMAL LOW (ref 13.0–17.0)
MCH: 25.1 pg — ABNORMAL LOW (ref 26.0–34.0)
MCHC: 29.8 g/dL — ABNORMAL LOW (ref 30.0–36.0)
MCV: 84.3 fL (ref 80.0–100.0)
Platelets: 322 10*3/uL (ref 150–400)
RBC: 4.02 MIL/uL — ABNORMAL LOW (ref 4.22–5.81)
RDW: 15.3 % (ref 11.5–15.5)
WBC: 13.9 10*3/uL — ABNORMAL HIGH (ref 4.0–10.5)
nRBC: 0 % (ref 0.0–0.2)

## 2020-08-17 LAB — LIPID PANEL
Cholesterol: 127 mg/dL (ref 0–200)
HDL: 39 mg/dL — ABNORMAL LOW (ref >40)
LDL Cholesterol: 74 mg/dL (ref 0–99)
Total CHOL/HDL Ratio: 3.3 RATIO
Triglycerides: 69 mg/dL (ref <150)
VLDL: 14 mg/dL (ref 0–40)

## 2020-08-17 LAB — ECHOCARDIOGRAM COMPLETE
AR max vel: 0.97 cm2
AV Area VTI: 0.89 cm2
AV Area mean vel: 1.18 cm2
AV Mean grad: 51 mmHg
AV Peak grad: 83.2 mmHg
Ao pk vel: 4.56 m/s
Height: 71 in
S' Lateral: 2.46 cm
Weight: 4964.8 oz

## 2020-08-17 LAB — GLUCOSE, CAPILLARY
Glucose-Capillary: 151 mg/dL — ABNORMAL HIGH (ref 70–99)
Glucose-Capillary: 199 mg/dL — ABNORMAL HIGH (ref 70–99)
Glucose-Capillary: 231 mg/dL — ABNORMAL HIGH (ref 70–99)
Glucose-Capillary: 197 mg/dL — ABNORMAL HIGH (ref 70–99)

## 2020-08-17 LAB — BASIC METABOLIC PANEL
Anion gap: 12 (ref 5–15)
BUN: 23 mg/dL (ref 8–23)
CO2: 28 mmol/L (ref 22–32)
Calcium: 8.9 mg/dL (ref 8.9–10.3)
Chloride: 99 mmol/L (ref 98–111)
Creatinine, Ser: 1.43 mg/dL — ABNORMAL HIGH (ref 0.61–1.24)
GFR, Estimated: 54 mL/min — ABNORMAL LOW (ref >60)
Glucose, Bld: 166 mg/dL — ABNORMAL HIGH (ref 70–99)
Potassium: 3.9 mmol/L (ref 3.5–5.1)
Sodium: 139 mmol/L (ref 135–145)

## 2020-08-17 LAB — HEPARIN LEVEL (UNFRACTIONATED): Heparin Unfractionated: 0.4 IU/mL (ref 0.30–0.70)

## 2020-08-17 MED ORDER — OXYCODONE HCL ER 10 MG PO T12A
10.0000 mg | EXTENDED_RELEASE_TABLET | Freq: Two times a day (BID) | ORAL | Status: DC
  Administered 2020-08-17 – 2020-08-23 (×13): 10 mg via ORAL
  Filled 2020-08-17 (×13): qty 1

## 2020-08-17 MED ORDER — HYDROCODONE-ACETAMINOPHEN 10-325 MG PO TABS
1.0000 | ORAL_TABLET | Freq: Four times a day (QID) | ORAL | Status: DC | PRN
  Administered 2020-08-20 – 2020-08-23 (×3): 1 via ORAL
  Filled 2020-08-17 (×3): qty 1

## 2020-08-17 MED ORDER — ADULT MULTIVITAMIN W/MINERALS CH
1.0000 | ORAL_TABLET | Freq: Every day | ORAL | Status: DC
  Administered 2020-08-17 – 2020-08-22 (×6): 1 via ORAL
  Filled 2020-08-17 (×6): qty 1

## 2020-08-17 MED ORDER — PANTOPRAZOLE SODIUM 40 MG PO TBEC
40.0000 mg | DELAYED_RELEASE_TABLET | Freq: Two times a day (BID) | ORAL | Status: DC
  Administered 2020-08-17 – 2020-08-23 (×13): 40 mg via ORAL
  Filled 2020-08-17 (×13): qty 1

## 2020-08-17 MED ORDER — SODIUM CHLORIDE 0.9 % WEIGHT BASED INFUSION
3.0000 mL/kg/h | INTRAVENOUS | Status: DC
  Administered 2020-08-18: 3 mL/kg/h via INTRAVENOUS

## 2020-08-17 MED ORDER — ORAL CARE MOUTH RINSE
15.0000 mL | Freq: Two times a day (BID) | OROMUCOSAL | Status: DC
  Administered 2020-08-17 – 2020-08-23 (×11): 15 mL via OROMUCOSAL

## 2020-08-17 MED ORDER — SODIUM CHLORIDE 0.9% FLUSH
3.0000 mL | Freq: Two times a day (BID) | INTRAVENOUS | Status: DC
  Administered 2020-08-18: 3 mL via INTRAVENOUS

## 2020-08-17 MED ORDER — SODIUM CHLORIDE 0.9 % WEIGHT BASED INFUSION
1.0000 mL/kg/h | INTRAVENOUS | Status: DC
  Administered 2020-08-18: 1 mL/kg/h via INTRAVENOUS

## 2020-08-17 MED ORDER — ASPIRIN 81 MG PO CHEW
81.0000 mg | CHEWABLE_TABLET | ORAL | Status: AC
  Administered 2020-08-18: 81 mg via ORAL
  Filled 2020-08-17: qty 1

## 2020-08-17 MED ORDER — SODIUM CHLORIDE 0.9% FLUSH: 3.0000 mL | INTRAVENOUS | Status: DC | PRN

## 2020-08-17 MED ORDER — CHLORHEXIDINE GLUCONATE 0.12 % MT SOLN
15.0000 mL | Freq: Two times a day (BID) | OROMUCOSAL | Status: DC
  Administered 2020-08-17 – 2020-08-23 (×9): 15 mL via OROMUCOSAL
  Filled 2020-08-17 (×12): qty 15

## 2020-08-17 MED ORDER — VITAMIN D3 25 MCG (1000 UNIT) PO TABS
1000.0000 [IU] | ORAL_TABLET | Freq: Every day | ORAL | Status: DC
  Administered 2020-08-17 – 2020-08-22 (×6): 1000 [IU] via ORAL
  Filled 2020-08-17 (×13): qty 1

## 2020-08-17 MED ORDER — FOLIC ACID 1 MG PO TABS
1.0000 mg | ORAL_TABLET | Freq: Every day | ORAL | Status: DC
  Administered 2020-08-17 – 2020-08-23 (×7): 1 mg via ORAL
  Filled 2020-08-17 (×7): qty 1

## 2020-08-17 MED ORDER — SODIUM CHLORIDE 0.9 % IV SOLN: 250.0000 mL | INTRAVENOUS | Status: DC | PRN

## 2020-08-17 NOTE — Progress Notes (Signed)
Echocardiogram 2D Echocardiogram has been performed.  Lawrence Rose 08/17/2020, 10:15 AM

## 2020-08-17 NOTE — Progress Notes (Signed)
Ok to dc NTG ointment PRN since it's a duplication with SL per Dr Butler Denmark.  Ulyses Southward, PharmD, BCIDP, AAHIVP, CPP Infectious Disease Pharmacist 08/17/2020 12:24 PM

## 2020-08-17 NOTE — Progress Notes (Signed)
Bilateral lower extremity venous duplex completed. Refer to "CV Proc" under chart review to view preliminary results.  08/17/2020 10:12 AM Eula Fried., MHA, RVT, RDCS, RDMS

## 2020-08-17 NOTE — Progress Notes (Signed)
   08/16/20 2000  Assess: MEWS Score  Temp 99.1 F (37.3 C)  BP 139/81  ECG Heart Rate (!) 111  Resp 18  Level of Consciousness Alert  SpO2 99 %  O2 Device Nasal Cannula  O2 Flow Rate (L/min) 2 L/min  Assess: MEWS Score  MEWS Temp 0  MEWS Systolic 0  MEWS Pulse 2  MEWS RR 0  MEWS LOC 0  MEWS Score 2  MEWS Score Color Yellow  Assess: if the MEWS score is Yellow or Red  Were vital signs taken at a resting state? Yes  Focused Assessment No change from prior assessment  Early Detection of Sepsis Score *See Row Information* Low  MEWS guidelines implemented *See Row Information* Yes  Treat  MEWS Interventions Administered scheduled meds/treatments;Administered prn meds/treatments;Escalated (See documentation below);Consulted Respiratory Therapy  Pain Scale 0-10  Pain Score 0  Take Vital Signs  Increase Vital Sign Frequency  Yellow: Q 2hr X 2 then Q 4hr X 2, if remains yellow, continue Q 4hrs  Escalate  MEWS: Escalate Yellow: discuss with charge nurse/RN and consider discussing with provider and RRT  Notify: Charge Nurse/RN  Name of Charge Nurse/RN Notified Alcario Drought  Date Charge Nurse/RN Notified 08/16/20  Time Charge Nurse/RN Notified 2000  Document  Patient Outcome Stabilized after interventions  Progress note created (see row info) Yes

## 2020-08-17 NOTE — Consult Note (Signed)
Cardiology Admission History and Physical:   Patient ID: Lawrence Rose MRN: [id number]; DOB: 07-23-1956   Admission date: 08/15/2020  Primary Care Provider: Pcp, No [place] Cardiologist: No primary care provider on file.  [facility] Electrophysiologist:  None   Chief Complaint:  Shortness of breath  Patient Profile:   Lawrence Rose is a 64 y.o. male with Diabetes with HTN, HLD, Morbid Obesity and OSA on CPAP, and CKD Stage III who presents with troponin elevation.  Consult is for shortness of breath  History of Present Illness:   Lawrence Rose who presents with shortness of breath.  Patient notes that for about one year he has had some shortness of breath.  This has progressed in the last two to three weeks.  Notes that he now has shortness of breath when ambulating around his house.  Notes no resting SOB, but notes PND and orthopnea.  Denies leg swelling but has swelling around his abdomen.  Through the year has lost 50 lbs with working to improve his diet and trying to exercise.  This is complicated by change in his lasix dose that also improved his fluid status.  Per chart review, patient was noted to have chest pain that also brought him to the ED 08/16/20.  Notable for novel troponin 350->420 and for bilateral pleural effusions.    Presently, he notes no chest pain but still shortness of breath.  No fevers, chills night sweats (received ABx X1 in ED).  No bleeding issues with heparin drip.  Echo notable for no WMAs but for Severe Aortic Stenosis.  Past Medical History:  Diagnosis Date  . Apnea   . Chronic back pain   . Chronic knee pain   . Diabetes mellitus without complication ([place])   . Hypertension     Past Surgical History:  Procedure Laterality Date  . BACK SURGERY       Medications Prior to Admission: Prior to Admission medications   Medication Sig Start Date End Date Taking? Authorizing Provider  albuterol (VENTOLIN HFA) 108 (90 Base) MCG/ACT inhaler  Inhale 2 puffs into the lungs every 4 (four) hours as needed for wheezing or shortness of breath.  08/22/16  Yes [provider]  aspirin EC 81 MG tablet Take 81 mg by mouth at bedtime. Swallow whole.   Yes [provider]  Cholecalciferol 25 MCG (1000 UT) tablet Take 1,000 Units by mouth at bedtime.    Yes [provider]  cloNIDine (CATAPRES) 0.1 MG tablet Take 0.1 mg by mouth 2 (two) times daily.  06/15/20  Yes [provider]  diltiazem (CARDIZEM CD) 240 MG 24 hr capsule Take 240 mg by mouth daily. 07/07/20  Yes [provider]  diphenhydrAMINE (BENADRYL) 25 MG tablet Take 25 mg by mouth daily.   Yes [provider]  folic acid (FOLVITE) 1 MG tablet Take 1 mg by mouth daily. 07/10/20  Yes [provider]  furosemide (LASIX) 40 MG tablet Take 20 mg by mouth daily.  06/18/20  Yes [provider]  gabapentin (NEURONTIN) 300 MG capsule Take 600 mg by mouth 2 (two) times daily.  08/12/20  Yes [provider]  HYDROcodone-acetaminophen (NORCO) 10-325 MG tablet Take 1 tablet by mouth every 6 (six) hours as needed (pain).  08/04/20  Yes [provider]  latanoprost (XALATAN) 0.005 % ophthalmic solution Place 1 drop into both eyes at bedtime.  07/27/20  Yes [provider]  lisinopril (ZESTRIL) 40 MG tablet Take 40 mg by mouth  at bedtime.  05/25/20  Yes [provider]  metFORMIN (GLUCOPHAGE) 1000 MG tablet Take 1,000 mg by mouth 2 (two) times daily. 07/10/20  Yes [provider]  Multiple Vitamin (MULTIVITAMIN WITH MINERALS) TABS tablet Take 1 tablet by mouth at bedtime.   Yes [provider]  oxyCODONE ER (XTAMPZA ER) 9 MG C12A Take 9 mg by mouth every 12 (twelve) hours.   Yes [provider]  pantoprazole (PROTONIX) 40 MG tablet Take 40 mg by mouth 2 (two) times daily. 05/26/20  Yes [provider]  potassium chloride SA (KLOR-CON) 20 MEQ tablet Take 20 mEq by mouth  daily. 07/07/20  Yes [provider]  [place] into the lungs at bedtime. CPAP   Yes [provider]  rosuvastatin (CRESTOR) 20 MG tablet Take 20 mg by mouth daily.  05/25/20  Yes [provider]  Blood Glucose Monitoring Suppl (FIFTY50 GLUCOSE METER 2.0) w/Device KIT See admin instructions. 05/16/19   [provider]     Allergies:    Allergies  Allergen Reactions  . Cocoa Hives  . Sulfamethoxazole-Trimethoprim Anaphylaxis  . Chocolate Hives  . Citrus Hives    Reaction to oranges and orange juice  . Fish Allergy Hives and Swelling    Throat swelling  . Shellfish Allergy Hives and Swelling    Throat swelling  . Sulfa Antibiotics Hives and Swelling    Throat swelling  . Tetracycline Swelling    Social History:   Social History   Socioeconomic History  . Marital status: Single    Spouse name: Not on file  . Number of children: Not on file  . Years of education: Not on file  . Highest education level: Not on file  Occupational History  . Not on file  Tobacco Use  . Smoking status: Never Smoker  . Smokeless tobacco: Never Used  Substance and Sexual Activity  . Alcohol use: No  . Drug use: Not Currently  . Sexual activity: Not on file  Other Topics Concern  . Not on file  Social History Narrative  . Not on file   Social Determinants of Health   Financial Resource Strain:   . Difficulty of Paying Living Expenses: Not on file  Food Insecurity:   . Worried About Charity fundraiser in the Last Year: Not on file  . Ran Out of Food in the Last Year: Not on file  Transportation Needs:   . Lack of Transportation (Medical): Not on file  . Lack of Transportation (Non-Medical): Not on file  Physical Activity:   . Days of Exercise per Week: Not on file  . Minutes of Exercise per Session: Not on file  Stress:   . Feeling of Stress : Not on file  Social Connections:   . Frequency of Communication with Friends and  Family: Not on file  . Frequency of Social Gatherings with Friends and Family: Not on file  . Attends Religious Services: Not on file  . Active Member of Clubs or Organizations: Not on file  . Attends Archivist Meetings: Not on file  . Marital Status: Not on file  Intimate Partner Violence:   . Fear of Current or Ex-Partner: Not on file  . Emotionally Abused: Not on file  . Physically Abused: Not on file  . Sexually Abused: Not on file    Family History:   The patient's family history is negative for AS, valve disease, or bicuspid aortic valve.  Mother died  from a brain aneurysm.  ROS:  Please see the history of present illness.  all other ROS reviewed and negative.     Physical Exam/Data:   Vitals:   08/17/20 0015 08/17/20 0500 08/17/20 0815 08/17/20 1149  BP:  (!) 141/66 138/83 133/67  Pulse:   (!) 107 88  Resp:  20  20  Temp:  99.2 F (37.3 C) 99 F (37.2 C) 98.8 F (37.1 C)  TempSrc:  Oral Oral Oral  SpO2: 90% 90% 91% 93%  Weight:  (!) 140.8 kg    Height:        Intake/Output Summary (Last 24 hours) at 08/17/2020 1413 Last data filed at 08/17/2020 1246 Gross per 24 hour  Intake 2142.4 ml  Output 1465 ml  Net 677.4 ml   Last 3 Weights 08/17/2020 08/16/2020 08/15/2020  Weight (lbs) 310 lb 4.8 oz 312 lb 6.3 oz 320 lb  Weight (kg) 140.751 kg 141.7 kg 145.151 kg     Body mass index is 43.28 kg/m.  General:  Obese Male in no acute distress HEENT: normal Neck: no JVD at 30 degrees Endocrine:  No thryomegaly Vascular: No carotid bruits; FA pulses 2+ bilaterally without bruits , R Radial +2   Cardiac:  normal S1, S2; RRR; *III/VI systolic crescendo/decresecendo Lungs:  clear to auscultation bilaterally, no wheezing, rhonchi or rales though worse air movement in bases Abd: soft, nontenderly distended but with dullness to percussion and fluid wave Ext: no edema Musculoskeletal:  No deformities, BUE and BLE strength normal and equal Skin: warm and dry   Neuro:  CNs 2-12 intact, no focal abnormalities noted Psych:  Normal affect   EKG:  The ECG that was done was personally reviewed and demonstrates sinus tachycardia with rare PVCs  Relevant CV Studies: Echo IMPRESSIONS severe AS of tricuspid valve  Laboratory Data:  High Sensitivity Troponin:   Recent Labs  Lab 08/15/20 1955 08/15/20 2241 08/16/20 1459 08/16/20 1815 08/16/20 2105  TROPONINIHS 402* 330* 148* 164* 176*  175*      Chemistry Recent Labs  Lab 08/16/20 1459 08/17/20 0351  NA 139 139  K 4.2 3.9  CL 95* 99  CO2 29 28  GLUCOSE 209* 166*  BUN 22 23  CREATININE 1.41* 1.43*  CALCIUM 9.5 8.9  GFRNONAA 55* 54*  ANIONGAP 15 12    Recent Labs  Lab 08/15/20 1801  PROT 7.7  ALBUMIN 3.7  AST 17  ALT 16  ALKPHOS 50  BILITOT 0.4   Hematology Recent Labs  Lab 08/16/20 1459 08/17/20 0351  WBC 10.8* 13.9*  RBC 4.31 4.02*  HGB 10.6* 10.1*  HCT 36.1* 33.9*  MCV 83.8 84.3  MCH 24.6* 25.1*  MCHC 29.4* 29.8*  RDW 15.1 15.3  PLT 340 322   BNP Recent Labs  Lab 08/15/20 1801 08/16/20 2105  BNP 152.3* 246.2*    DDimer  Recent Labs  Lab 08/16/20 1459  DDIMER 1.02*    Radiology/Studies:  ECHOCARDIOGRAM COMPLETE  Result Date: 08/17/2020    ECHOCARDIOGRAM REPORT   [name] Date of Exam: 08/17/2020 Medical Rec #:  [id number]      Height:       71.0 in Accession #:    [id number]     Weight:       310.3 lb Date of Birth:  11-12-1955      BSA:          2.541 m Patient Age:    6 years  BP:           138/83 mmHg Patient Gender: M              HR:           97 bpm. Exam Location:  Inpatient Procedure: 2D Echo, Color Doppler and Cardiac Doppler Indications:    NSTEMI  History:        Patient has prior history of Echocardiogram examinations, most                 recent 08/10/2018. Risk Factors:Hypertension and Diabetes. Prior                 performed at [facility]:    [facility] Referring Phys: [id number] [place]  1. Left ventricular ejection fraction, by estimation, is 60 to 65%. The left ventricle has normal function. The left ventricle has no regional wall motion abnormalities. There is moderate concentric left ventricular hypertrophy. Left ventricular diastolic parameters are indeterminate.  2. Right ventricular systolic function is normal. The right ventricular size is normal.  3. Left atrial size was severely dilated.  4. The mitral valve is normal in structure. Trivial mitral valve regurgitation. The posterior leaflet appears to have limited mobility with mild flow acceleration noted across the mitral valve with mean gradient 48mHg at HR 101. There is mild narrowing of the mitral valve orfice without significant stenosis.  5. The aortic valve is tricuspid. There is severe calcifcation of the aortic valve. There is severe thickening of the aortic valve. Aortic valve regurgitation is not visualized. Severe aortic valve stenosis with AVA 0.9 (by continuity), mean gradient 570mg, peak gradient 825m, Vmax 4.65cm2, DOI 0.2.  6. The inferior vena cava is dilated in size with >50% respiratory variability, suggesting right atrial pressure of 8 mmHg. FINDINGS  Left Ventricle: Left ventricular ejection fraction, by estimation, is 60 to 65%. The left ventricle has normal function. The left ventricle has no regional wall motion abnormalities. The left ventricular internal cavity size was normal in size. There is  moderate concentric left ventricular hypertrophy. Left ventricular diastolic parameters are indeterminate. Right Ventricle: The right ventricular size is normal. Right vetricular wall thickness was not well visualized. Right ventricular systolic function is normal. Left Atrium: Left atrial size was severely dilated. Right Atrium: Right atrial size was normal in size. Pericardium: There is no evidence of pericardial effusion. Mitral Valve: The mitral valve is normal in structure. There is mild thickening of  the mitral valve leaflet(s). Trivial mitral valve regurgitation. The posterior leaflet appears to have limited mobility with mild flow acceleration noted across the mitral  valve with mean gradient 6mm46mat HR 101. There is mild narrowing fo the mitral valve orfice without significant stenosis. MV peak gradient, 10.1 mmHg. The mean mitral valve gradient is 6.0 mmHg. Tricuspid Valve: The tricuspid valve is normal in structure. Tricuspid valve regurgitation is trivial. Aortic Valve: The aortic valve is tricuspid. There is severe calcifcation of the aortic valve. There is severe thickening of the aortic valve. Aortic valve regurgitation is not visualized. Severe aortic stenosis is present. Aortic valve mean gradient measures 51.0 mmHg. Aortic valve peak gradient measures 83.2 mmHg. Aortic valve area, by VTI measures 0.89 cm. DOI 0.2. Pulmonic Valve: The pulmonic valve was normal in structure. Pulmonic valve regurgitation is trivial. Aorta: The aortic root and ascending aorta are structurally normal, with no evidence of dilitation. Venous: The inferior vena cava is dilated in size with greater than  50% respiratory variability, suggesting right atrial pressure of 8 mmHg. IAS/Shunts: No atrial level shunt detected by color flow Doppler.  LEFT VENTRICLE PLAX 2D LVIDd:         4.29 cm  Diastology LVIDs:         2.46 cm  LV e' medial:  8.05 cm/s LV PW:         1.61 cm  LV e' lateral: 10.40 cm/s LV IVS:        1.24 cm LVOT diam:     2.40 cm LV SV:         90 LV SV Index:   36 LVOT Area:     4.52 cm  RIGHT VENTRICLE RV S prime:     10.30 cm/s TAPSE (M-mode): 2.0 cm LEFT ATRIUM              Index       RIGHT ATRIUM           Index LA diam:        3.70 cm  1.46 cm/m  RA Area:     23.50 cm LA Vol (A2C):   108.0 ml 42.50 ml/m RA Volume:   75.60 ml  29.75 ml/m LA Vol (A4C):   123.0 ml 48.40 ml/m LA Biplane Vol: 120.0 ml 47.22 ml/m  AORTIC VALVE AV Area (Vmax):    0.97 cm AV Area (Vmean):   1.18 cm AV Area (VTI):     0.89  cm AV Vmax:           456.00 cm/s AV Vmean:          268.000 cm/s AV VTI:            1.017 m AV Peak Grad:      83.2 mmHg AV Mean Grad:      51.0 mmHg LVOT Vmax:         97.50 cm/s LVOT Vmean:        70.000 cm/s LVOT VTI:          0.200 m LVOT/AV VTI ratio: 0.20  AORTA Ao Root diam: 3.20 cm Ao Asc diam:  3.40 cm MITRAL VALVE MV Peak grad: 10.1 mmHg  SHUNTS MV Mean grad: 6.0 mmHg   Systemic VTI:  0.20 m MV Vmax:      1.59 m/s   Systemic Diam: 2.40 cm MV Vmean:     117.0 cm/s Gwyndolyn Kaufman MD Electronically signed by Gwyndolyn Kaufman MD Signature Date/Time: 08/17/2020/12:41:56 PM    Final    VAS [place] LOWER EXTREMITY VENOUS (DVT)  Result Date: 08/17/2020  Lower Venous DVTStudy Indications: Swelling.  Limitations: Body habitus and poor ultrasound/tissue interface. Comparison Study: No prior study Performing Technologist: Maudry Mayhew MHA, RDMS, RVT, RDCS  Examination Guidelines: A complete evaluation includes B-mode imaging, spectral Doppler, color Doppler, and power Doppler as needed of all accessible portions of each vessel. Bilateral testing is considered an integral part of a complete examination. Limited examinations for reoccurring indications may be performed as noted. The reflux portion of the exam is performed with the patient in reverse Trendelenburg.  +---------+---------------+---------+-----------+----------+--------------+ RIGHT    CompressibilityPhasicitySpontaneityPropertiesThrombus Aging +---------+---------------+---------+-----------+----------+--------------+ CFV      Full           Yes      Yes                                 +---------+---------------+---------+-----------+----------+--------------+ SFJ  Full                                                        +---------+---------------+---------+-----------+----------+--------------+ FV Prox  Full                                                         +---------+---------------+---------+-----------+----------+--------------+ FV Mid   Full                                                        +---------+---------------+---------+-----------+----------+--------------+ FV DistalFull                                                        +---------+---------------+---------+-----------+----------+--------------+ PFV      Full                                                        +---------+---------------+---------+-----------+----------+--------------+ POP      Full           Yes      Yes                                 +---------+---------------+---------+-----------+----------+--------------+ PTV      Full                                                        +---------+---------------+---------+-----------+----------+--------------+ PERO     Full                                                        +---------+---------------+---------+-----------+----------+--------------+   +---------+---------------+---------+-----------+----------+--------------+ LEFT     CompressibilityPhasicitySpontaneityPropertiesThrombus Aging +---------+---------------+---------+-----------+----------+--------------+ CFV      Full           Yes      Yes                                 +---------+---------------+---------+-----------+----------+--------------+ SFJ      Full                                                        +---------+---------------+---------+-----------+----------+--------------+  FV Prox  Full                                                        +---------+---------------+---------+-----------+----------+--------------+ FV Mid   Full                                                        +---------+---------------+---------+-----------+----------+--------------+ FV DistalFull                                                         +---------+---------------+---------+-----------+----------+--------------+ PFV      Full                                                        +---------+---------------+---------+-----------+----------+--------------+ POP      Full           Yes      Yes                                 +---------+---------------+---------+-----------+----------+--------------+ PTV      Full                                                        +---------+---------------+---------+-----------+----------+--------------+ PERO     Full                                                        +---------+---------------+---------+-----------+----------+--------------+     Summary: RIGHT: - There is no evidence of deep vein thrombosis in the lower extremity.  - No cystic structure found in the popliteal fossa.  LEFT: - There is no evidence of deep vein thrombosis in the lower extremity.  - No cystic structure found in the popliteal fossa.  *See table(s) above for measurements and observations.    Preliminary     Assessment and Plan:   Severe Symptomatic Aortic Stenosis NSTEMI Morbid Obesity HTN HLD CKD Stage III - - with SOB and HF sx; elevated troponin and BNP - continue ASA and heparin - continue statin - continue ACEi - will plan for LHC in 11/2//21; keep npo at midnight - Risks and benefits of cardiac catheterization have been discussed with the patient.  These include bleeding, infection, kidney damage, stroke, heart attack, death.  The patient understands these risks and is willing to proceed. - Discussed both SAVR and TAVR as possible interventions; will discuss with with CT Surgery or structural team based on  results   For questions or updates, please contact [facility] Please consult [email or web address] for contact info under     Signed, Werner Lean, MD  08/17/2020 2:13 PM

## 2020-08-17 NOTE — Progress Notes (Signed)
ANTICOAGULATION CONSULT NOTE   Pharmacy Consult for heparin Indication: chest pain/ACS  Allergies  Allergen Reactions  . Cocoa Hives  . Sulfamethoxazole-Trimethoprim Anaphylaxis  . Chocolate Hives  . Citrus Hives    Reaction to oranges and orange juice  . Fish Allergy Hives and Swelling    Throat swelling  . Shellfish Allergy Hives and Swelling    Throat swelling  . Sulfa Antibiotics Hives and Swelling    Throat swelling  . Tetracycline Swelling    Patient Measurements: Height: 5\' 11"  (180.3 cm) Weight: (!) 140.8 kg (310 lb 4.8 oz) (scale A) IBW/kg (Calculated) : 75.3 Heparin Dosing Weight: 109.4kg  Vital Signs: Temp: 99.2 F (37.3 C) (11/01 0500) Temp Source: Oral (11/01 0500) BP: 141/66 (11/01 0500) Pulse Rate: 100 (11/01 0013)  Labs: Recent Labs    08/15/20 1801 08/15/20 1801 08/15/20 1955 08/16/20 0443 08/16/20 1459 08/16/20 1815 08/16/20 2105 08/17/20 0351  HGB 10.1*   < >  --   --  10.6*  --   --  10.1*  HCT 34.0*  --   --   --  36.1*  --   --  33.9*  PLT 306  --   --   --  340  --   --  322  HEPARINUNFRC  --   --   --  0.62 0.55  --   --  0.40  CREATININE 1.38*  --   --   --  1.41*  --   --  1.43*  TROPONINIHS 350*   < >   < >  --  148* 164* 176*  175*  --    < > = values in this interval not displayed.    Estimated Creatinine Clearance: 73 mL/min (A) (by C-G formula based on SCr of 1.43 mg/dL (H)).   Medical History: Past Medical History:  Diagnosis Date  . Apnea   . Chronic back pain   . Chronic knee pain   . Diabetes mellitus without complication ([facility])   . Hypertension    Assessment: 37 YOM presenting with SOB, elevated troponin, not on anticoagulation PTA.    Heparin level therapeutic this AM. Hgb remains stable. Plan for ECHO this AM. Cont IV heparin until further plan.   Goal of Therapy:  Heparin level 0.3-0.7 units/ml Monitor platelets by anticoagulation protocol: Yes   Plan:  Continue heparin gtt at 1350 units/hr Daily HL  and CBC F/u cards plan  69, PharmD, BCIDP, AAHIVP, CPP Infectious Disease Pharmacist 08/17/2020 8:10 AM

## 2020-08-17 NOTE — Progress Notes (Signed)
RT placed patient on CPAP with 2L O2 bled into circuit. Patient tolerating CPAP well at this time. RT will monitor as needed. 

## 2020-08-17 NOTE — H&P (Signed)
DOS CKD 3 Flat small Ti leak Ef 60% with mod LVH

## 2020-08-17 NOTE — Progress Notes (Signed)
PROGRESS NOTE    [name]   TIW:[id number]  DOB: 08-02-1956  DOA: 08/15/2020 PCP: Pcp, No   Brief Narrative:  Lawrence Rose is a 64 y.o. male with medical history significant of Obesity, HTN, HLD , GERD, DJD of knee, DMII, OSA on CPAP,who presents to  [facility] with complaint of progressive shortness of breath x 1 week. He states that with minimal activity such has ambulating to the bathroom he noted significant DOE with at time is associated with palpitations but note with chest pain.He notes that he recently has lasix cut in half earlier in the month due MD noting signs of dehydration. Patient states he has not noted increase swelling in lower extremities with this change.  In ED : temp 99.4, HR 102, 87% on room air with wheezing  Cr 1.38- last Cr 0.8 in 2015  Troponin: 350> 402 CTA in ED> Small bilateral pleural effusions. Compressive atelectasis in the lower lobes. Patchy bilateral ground-glass opacities in the upper lobes concerning for pneumonia.  Placed on Heparin infusion, Ceftriaxone and Azithromycin, given a dose of Solumedrol.  Subjective: Feeling slightly better today but still short of breath on exertion. DOE has been increasing for a month but worse this past week. He thinks he had an ECHO about 3 yrs ago. He has had a cardiac murmur for many years.     Assessment & Plan:   Principal Problem:   Acute respiratory failure with hypoxia    Severe Aortic stenosis - ECHO 11/1> severe Aortic stenosis, severe LVH, left atrium severely dilated - suspect pulmonary edema and aortic stenosis as the cause - no cough, no leukocytosis on admission labs, fever is low grade- has never smoked- follow temp- stop antibiotics and Prednisone - cardiology has been consulted  Active Problems:   NSTEMI (non-ST elevated myocardial infarction)   - on Heparin infusion  Ref. Range 08/15/2020 22:41 08/16/2020 14:59 08/16/2020 18:15 08/16/2020 21:05 08/16/2020 21:05   Troponin I (High Sensitivity) Latest Ref Range: <18 ng/L 330 (HH) 148 (HH) 164 (HH) 175 (HH) 176 (HH)     CKD (chronic kidney disease) stage 3 - outpt Cr 1.58 05/20/20 - follow Cr  Pedal edema, chronic - has resolved   DM2, controlled - hold Metformin- not on insulin - cont to follow sugars and cont SSI - last A1c 5.5 on 05/20/20  HTN, essential - cont Cardizem, Clonidine, Metoprolol and Lisinopril     OSA on CPAP CPAP At bedtime ordered  Chronic pain -cont Norco and Neurontin  - Oxycodone ED replaced with Oxycontin  Morbid obesity Body mass index is 43.28 kg/m.   Time spent in minutes: 35 DVT prophylaxis: currently on heparin infusion  Code Status: Full code Family Communication:  Disposition Plan:  Status is: Inpatient  Remains inpatient appropriate because:hypoxia and DOE.   Dispo: The patient is from: Home              Anticipated d/c is to: Home              Anticipated d/c date is: 2 days              Patient currently is not medically stable to d/c.      Consultants:   Cardiology Procedures:    Antimicrobials:  Anti-infectives (From admission, onward)   Start     Dose/Rate Route Frequency Ordered Stop   08/16/20 2000  cefTRIAXone (ROCEPHIN) 2 g in sodium chloride 0.9 % 100 mL IVPB  2 g 200 mL/hr over 30 Minutes Intravenous Every 24 hours 08/16/20 1911 08/21/20 1959   08/16/20 2000  azithromycin (ZITHROMAX) 500 mg in sodium chloride 0.9 % 250 mL IVPB        500 mg 250 mL/hr over 60 Minutes Intravenous Every 24 hours 08/16/20 1911 08/21/20 1959       Objective: Vitals:   08/17/20 0015 08/17/20 0500 08/17/20 0815 08/17/20 1149  BP:  (!) 141/66 138/83 133/67  Pulse:   (!) 107 88  Resp:  20  20  Temp:  99.2 F (37.3 C) 99 F (37.2 C) 98.8 F (37.1 C)  TempSrc:  Oral Oral Oral  SpO2: 90% 90% 91% 93%  Weight:  (!) 140.8 kg    Height:        Intake/Output Summary (Last 24 hours) at 08/17/2020 1238 Last data filed at 08/17/2020  1056 Gross per 24 hour  Intake 1962.4 ml  Output 1515 ml  Net 447.4 ml   Filed Weights   08/15/20 1720 08/16/20 1414 08/17/20 0500  Weight: (!) 145.2 kg (!) 141.7 kg (!) 140.8 kg    Examination: General exam: Appears comfortable  HEENT: PERRLA, oral mucosa moist, no sclera icterus or thrush Respiratory system: Clear to auscultation. Respiratory effort normal. Cardiovascular system: S1 & S2 heard, RRR.  3/6 murmur on LUS border and Apex Gastrointestinal system: Abdomen soft, non-tender, nondistended. Normal bowel sounds. Central nervous system: Alert and oriented. No focal neurological deficits. Extremities: No cyanosis, clubbing or edema Skin: No rashes or ulcers- darkening and thickening of skin on lower extermities Psychiatry:  Mood & affect appropriate.     Data Reviewed: I have personally reviewed following labs and imaging studies  CBC: Recent Labs  Lab 08/15/20 1801 08/16/20 1459 08/17/20 0351  WBC 8.4 10.8* 13.9*  NEUTROABS 6.2  --   --   HGB 10.1* 10.6* 10.1*  HCT 34.0* 36.1* 33.9*  MCV 84.4 83.8 84.3  PLT 306 340 322   Basic Metabolic Panel: Recent Labs  Lab 08/15/20 1801 08/16/20 1459 08/17/20 0351  NA 140 139 139  K 4.4 4.2 3.9  CL 100 95* 99  CO2 29 29 28   GLUCOSE 120* 209* 166*  BUN 17 22 23   CREATININE 1.38* 1.41* 1.43*  CALCIUM 9.2 9.5 8.9  MG  --  2.0  --    GFR: Estimated Creatinine Clearance: 73 mL/min (A) (by C-G formula based on SCr of 1.43 mg/dL (H)). Liver Function Tests: Recent Labs  Lab 08/15/20 1801  AST 17  ALT 16  ALKPHOS 50  BILITOT 0.4  PROT 7.7  ALBUMIN 3.7   No results for input(s): LIPASE, AMYLASE in the last 168 hours. No results for input(s): AMMONIA in the last 168 hours. Coagulation Profile: No results for input(s): INR, PROTIME in the last 168 hours. Cardiac Enzymes: No results for input(s): CKTOTAL, CKMB, CKMBINDEX, TROPONINI in the last 168 hours. BNP (last 3 results) No results for input(s): PROBNP in  the last 8760 hours. HbA1C: Recent Labs    08/16/20 1459  HGBA1C 5.8*   CBG: Recent Labs  Lab 08/16/20 2112 08/17/20 0628 08/17/20 1150  GLUCAP 161* 151* 199*   Lipid Profile: Recent Labs    08/17/20 0351  CHOL 127  HDL 39*  LDLCALC 74  TRIG 69  CHOLHDL 3.3   Thyroid Function Tests: Recent Labs    08/16/20 1459 08/16/20 2105  TSH 0.176*  --   FREET4  --  0.84   Anemia Panel: Recent Labs  08/16/20 2105  TIBC 294  IRON 44*   Urine analysis: No results found for: COLORURINE, APPEARANCEUR, LABSPEC, PHURINE, GLUCOSEU, HGBUR, BILIRUBINUR, KETONESUR, PROTEINUR, UROBILINOGEN, NITRITE, LEUKOCYTESUR Sepsis Labs: @LABRCNTIP (procalcitonin:4,lacticidven:4) ) Recent Results (from the past 240 hour(s))  Respiratory Panel by RT PCR (Flu A&B, Covid) - Nasopharyngeal Swab     Status: None   Collection Time: 08/15/20  5:19 PM   Specimen: Nasopharyngeal Swab  Result Value Ref Range Status   SARS Coronavirus 2 by RT PCR NEGATIVE NEGATIVE Final    Comment: (NOTE) SARS-CoV-2 target nucleic acids are NOT DETECTED.  The SARS-CoV-2 RNA is generally detectable in upper respiratoy specimens during the acute phase of infection. The lowest concentration of SARS-CoV-2 viral copies this assay can detect is 131 copies/mL. A negative result does not preclude SARS-Cov-2 infection and should not be used as the sole basis for treatment or other patient management decisions. A negative result may occur with  improper specimen collection/handling, submission of specimen other than nasopharyngeal swab, presence of viral mutation(s) within the areas targeted by this assay, and inadequate number of viral copies (<131 copies/mL). A negative result must be combined with clinical observations, patient history, and epidemiological information. The expected result is Negative.  Fact Sheet for Patients:  08/17/20  Fact Sheet for Healthcare Providers:   https://www.moore.com/  This test is no t yet approved or cleared by the https://www.young.biz/ FDA and  has been authorized for detection and/or diagnosis of SARS-CoV-2 by FDA under an Emergency Use Authorization (EUA). This EUA will remain  in effect (meaning this test can be used) for the duration of the COVID-19 declaration under Section 564(b)(1) of the Act, 21 U.S.C. section 360bbb-3(b)(1), unless the authorization is terminated or revoked sooner.     Influenza A by PCR NEGATIVE NEGATIVE Final   Influenza B by PCR NEGATIVE NEGATIVE Final    Comment: (NOTE) The Xpert Xpress SARS-CoV-2/FLU/RSV assay is intended as an aid in  the diagnosis of influenza from Nasopharyngeal swab specimens and  should not be used as a sole basis for treatment. Nasal washings and  aspirates are unacceptable for Xpert Xpress SARS-CoV-2/FLU/RSV  testing.  Fact Sheet for Patients: [place]  Fact Sheet for Healthcare Providers: https://www.moore.com/  This test is not yet approved or cleared by the https://www.young.biz/ FDA and  has been authorized for detection and/or diagnosis of SARS-CoV-2 by  FDA under an Emergency Use Authorization (EUA). This EUA will remain  in effect (meaning this test can be used) for the duration of the  Covid-19 declaration under Section 564(b)(1) of the Act, 21  U.S.C. section 360bbb-3(b)(1), unless the authorization is  terminated or revoked. Performed at [facility], [address] [place], [place]   Culture, blood (routine x 2) Call MD if unable to obtain prior to antibiotics being given     Status: None (Preliminary result)   Collection Time: 08/16/20  9:04 PM   Specimen: BLOOD  Result Value Ref Range Status   Specimen Description BLOOD RIGHT ARM  Final   Special Requests   Final    BOTTLES DRAWN AEROBIC AND ANAEROBIC Blood Culture results may not be optimal due to an excessive volume  of blood received in culture bottles   Culture   Final    NO GROWTH < 12 HOURS Performed at [facility], [address], [place], [place]    Report Status PENDING  Incomplete  Culture, blood (routine x 2) Call MD if unable  to obtain prior to antibiotics being given     Status: None (Preliminary result)   Collection Time: 08/16/20  9:05 PM   Specimen: BLOOD RIGHT HAND  Result Value Ref Range Status   Specimen Description BLOOD RIGHT HAND  Final   Special Requests   Final    BOTTLES DRAWN AEROBIC ONLY Blood Culture adequate volume   Culture   Final    NO GROWTH < 12 HOURS Performed at [facility], [address] [place], [place] [address]    Report Status PENDING  Incomplete         Radiology Studies: CT Angio Chest PE W and/or Wo Contrast  Result Date: 08/15/2020 CLINICAL DATA:  Shortness of breath EXAM: CT ANGIOGRAPHY CHEST WITH CONTRAST TECHNIQUE: Multidetector CT imaging of the chest was performed using the standard protocol during bolus administration of intravenous contrast. Multiplanar CT image reconstructions and MIPs were obtained to evaluate the vascular anatomy. CONTRAST:  100mL OMNIPAQUE IOHEXOL 350 MG/ML SOLN COMPARISON:  None. FINDINGS: Cardiovascular: No filling defects in the pulmonary arteries to suggest pulmonary emboli. Aortic atherosclerosis. Calcifications of the aortic valve. Heart is normal size. Mediastinum/Nodes: Scattered small mediastinal lymph nodes, none pathologically enlarged. Trachea and esophagus are unremarkable. Thyroid unremarkable. Lungs/Pleura: Small bilateral pleural effusions. Compressive atelectasis in the lower lobes. Patchy bilateral ground-glass airspace opacities most notable in the upper lobes concerning for pneumonia. Upper Abdomen: Imaging into the upper abdomen demonstrates no acute findings. Musculoskeletal: Chest wall soft tissues are unremarkable. No acute bony abnormality. Review of the MIP images confirms the  above findings. IMPRESSION: No evidence of pulmonary embolus. Small bilateral pleural effusions. Compressive atelectasis in the lower lobes. Patchy bilateral ground-glass opacities in the upper lobes concerning for pneumonia. Aortic Atherosclerosis (ICD10-I70.0). Electronically Signed   By: Charlett NoseKevin  Dover M.D.   On: 08/15/2020 19:54   DG Chest Port 1 View  Result Date: 08/15/2020 CLINICAL DATA:  Shortness of breath with exertion for 1 week. Dry cough. O2 sats 87%. EXAM: PORTABLE CHEST 1 VIEW COMPARISON:  06/08/2014 FINDINGS: Heart size is accentuated by portable technique. There are faint opacities at the lung bases bilaterally, consistent with early infiltrates. No pulmonary edema. IMPRESSION: Suspect bibasilar infiltrates. Electronically Signed   By: Norva PavlovElizabeth  Brown M.D.   On: 08/15/2020 17:44   VAS US LOWER EXTREMITY VENOUS (DVT)  Result Date: 08/17/2020  Lower Venous DVTStudy Indications: Swelling.  Limitations: Body habitus and poor ultrasound/tissue interface. Comparison Study: No prior study Performing Technologist: Gertie FeyMichelle Simonetti MHA, RDMS, RVT, RDCS  Examination Guidelines: A complete evaluation includes B-mode imaging, spectral Doppler, color Doppler, and power Doppler as needed of all accessible portions of each vessel. Bilateral testing is considered an integral part of a complete examination. Limited examinations for reoccurring indications may be performed as noted. The reflux portion of the exam is performed with the patient in reverse Trendelenburg.  +---------+---------------+---------+-----------+----------+--------------+ RIGHT    CompressibilityPhasicitySpontaneityPropertiesThrombus Aging +---------+---------------+---------+-----------+----------+--------------+ CFV      Full           Yes      Yes                                 +---------+---------------+---------+-----------+----------+--------------+ SFJ      Full                                                         +---------+---------------+---------+-----------+----------+--------------+  FV Prox  Full                                                        +---------+---------------+---------+-----------+----------+--------------+ FV Mid   Full                                                        +---------+---------------+---------+-----------+----------+--------------+ FV DistalFull                                                        +---------+---------------+---------+-----------+----------+--------------+ PFV      Full                                                        +---------+---------------+---------+-----------+----------+--------------+ POP      Full           Yes      Yes                                 +---------+---------------+---------+-----------+----------+--------------+ PTV      Full                                                        +---------+---------------+---------+-----------+----------+--------------+ PERO     Full                                                        +---------+---------------+---------+-----------+----------+--------------+   +---------+---------------+---------+-----------+----------+--------------+ LEFT     CompressibilityPhasicitySpontaneityPropertiesThrombus Aging +---------+---------------+---------+-----------+----------+--------------+ CFV      Full           Yes      Yes                                 +---------+---------------+---------+-----------+----------+--------------+ SFJ      Full                                                        +---------+---------------+---------+-----------+----------+--------------+ FV Prox  Full                                                        +---------+---------------+---------+-----------+----------+--------------+  FV Mid   Full                                                         +---------+---------------+---------+-----------+----------+--------------+ FV DistalFull                                                        +---------+---------------+---------+-----------+----------+--------------+ PFV      Full                                                        +---------+---------------+---------+-----------+----------+--------------+ POP      Full           Yes      Yes                                 +---------+---------------+---------+-----------+----------+--------------+ PTV      Full                                                        +---------+---------------+---------+-----------+----------+--------------+ PERO     Full                                                        +---------+---------------+---------+-----------+----------+--------------+     Summary: RIGHT: - There is no evidence of deep vein thrombosis in the lower extremity.  - No cystic structure found in the popliteal fossa.  LEFT: - There is no evidence of deep vein thrombosis in the lower extremity.  - No cystic structure found in the popliteal fossa.  *See table(s) above for measurements and observations.    Preliminary       Scheduled Meds: . aspirin EC  81 mg Oral Daily  . chlorhexidine  15 mL Mouth Rinse BID  . cholecalciferol  1,000 Units Oral QHS  . cloNIDine  0.1 mg Oral BID  . diltiazem  240 mg Oral Daily  . folic acid  1 mg Oral Daily  . gabapentin  600 mg Oral BID  . insulin aspart  0-9 Units Subcutaneous TID WC  . latanoprost  1 drop Both Eyes QHS  . lisinopril  40 mg Oral QHS  . mouth rinse  15 mL Mouth Rinse q12n4p  . metoprolol tartrate  12.5 mg Oral BID  . multivitamin with minerals  1 tablet Oral QHS  . oxyCODONE  10 mg Oral Q12H  . pantoprazole  40 mg Oral BID  . predniSONE  40 mg Oral Q breakfast  . rosuvastatin  20 mg Oral Daily   Continuous Infusions: . sodium chloride 10 mL/hr at 08/17/20 0500  .  azithromycin Stopped (08/17/20  0026)  . cefTRIAXone (ROCEPHIN)  IV Stopped (08/16/20 2038)  . heparin 1,350 Units/hr (08/17/20 0858)     LOS: 1 day      Calvert Cantor, MD [facility] Pager: [email or web address] 08/17/2020, 12:38 PM

## 2020-08-18 ENCOUNTER — Encounter (HOSPITAL_COMMUNITY): Payer: Self-pay | Admitting: Internal Medicine

## 2020-08-18 ENCOUNTER — Encounter (HOSPITAL_COMMUNITY)
Admission: EM | Disposition: A | Discharge status: Home / Self Care | Payer: Self-pay | Source: Home / Self Care | Attending: Internal Medicine

## 2020-08-18 DIAGNOSIS — Z9989 Dependence on other enabling machines and devices: Secondary | ICD-10-CM

## 2020-08-18 DIAGNOSIS — J9601 Acute respiratory failure with hypoxia: Secondary | ICD-10-CM | POA: Diagnosis not present

## 2020-08-18 DIAGNOSIS — I25110 Atherosclerotic heart disease of native coronary artery with unstable angina pectoris: Secondary | ICD-10-CM | POA: Diagnosis not present

## 2020-08-18 DIAGNOSIS — G4733 Obstructive sleep apnea (adult) (pediatric): Secondary | ICD-10-CM

## 2020-08-18 DIAGNOSIS — N1831 Chronic kidney disease, stage 3a: Secondary | ICD-10-CM | POA: Diagnosis not present

## 2020-08-18 DIAGNOSIS — I214 Non-ST elevation (NSTEMI) myocardial infarction: Secondary | ICD-10-CM | POA: Diagnosis not present

## 2020-08-18 DIAGNOSIS — I350 Nonrheumatic aortic (valve) stenosis: Secondary | ICD-10-CM | POA: Diagnosis not present

## 2020-08-18 HISTORY — PX: RIGHT/LEFT HEART CATH AND CORONARY ANGIOGRAPHY: CATH118266

## 2020-08-18 LAB — BASIC METABOLIC PANEL
Anion gap: 9 (ref 5–15)
BUN: 23 mg/dL (ref 8–23)
CO2: 30 mmol/L (ref 22–32)
Calcium: 8.7 mg/dL — ABNORMAL LOW (ref 8.9–10.3)
Chloride: 100 mmol/L (ref 98–111)
Creatinine, Ser: 1.43 mg/dL — ABNORMAL HIGH (ref 0.61–1.24)
GFR, Estimated: 54 mL/min — ABNORMAL LOW (ref >60)
Glucose, Bld: 158 mg/dL — ABNORMAL HIGH (ref 70–99)
Potassium: 4.2 mmol/L (ref 3.5–5.1)
Sodium: 139 mmol/L (ref 135–145)

## 2020-08-18 LAB — CBC
HCT: 33.9 % — ABNORMAL LOW (ref 39.0–52.0)
Hemoglobin: 9.8 g/dL — ABNORMAL LOW (ref 13.0–17.0)
MCH: 24.9 pg — ABNORMAL LOW (ref 26.0–34.0)
MCHC: 28.9 g/dL — ABNORMAL LOW (ref 30.0–36.0)
MCV: 86 fL (ref 80.0–100.0)
Platelets: 307 10*3/uL (ref 150–400)
RBC: 3.94 MIL/uL — ABNORMAL LOW (ref 4.22–5.81)
RDW: 15.3 % (ref 11.5–15.5)
WBC: 12.3 10*3/uL — ABNORMAL HIGH (ref 4.0–10.5)
nRBC: 0 % (ref 0.0–0.2)

## 2020-08-18 LAB — POCT I-STAT 7, (LYTES, BLD GAS, ICA,H+H)
Acid-Base Excess: 5 mmol/L — ABNORMAL HIGH (ref 0.0–2.0)
Bicarbonate: 33.4 mmol/L — ABNORMAL HIGH (ref 20.0–28.0)
Calcium, Ion: 1.2 mmol/L (ref 1.15–1.40)
HCT: 33 % — ABNORMAL LOW (ref 39.0–52.0)
Hemoglobin: 11.2 g/dL — ABNORMAL LOW (ref 13.0–17.0)
O2 Saturation: 93 %
Potassium: 4 mmol/L (ref 3.5–5.1)
Sodium: 142 mmol/L (ref 135–145)
TCO2: 36 mmol/L — ABNORMAL HIGH (ref 22–32)
pCO2 arterial: 71 mmHg (ref 32.0–48.0)
pH, Arterial: 7.281 — ABNORMAL LOW (ref 7.350–7.450)
pO2, Arterial: 80 mmHg — ABNORMAL LOW (ref 83.0–108.0)

## 2020-08-18 LAB — HEPARIN LEVEL (UNFRACTIONATED)
Heparin Unfractionated: 0.78 IU/mL — ABNORMAL HIGH (ref 0.30–0.70)
Heparin Unfractionated: 0.26 IU/mL — ABNORMAL LOW (ref 0.30–0.70)

## 2020-08-18 LAB — POCT I-STAT EG7
Acid-Base Excess: 5 mmol/L — ABNORMAL HIGH (ref 0.0–2.0)
Bicarbonate: 34 mmol/L — ABNORMAL HIGH (ref 20.0–28.0)
Calcium, Ion: 1.21 mmol/L (ref 1.15–1.40)
HCT: 33 % — ABNORMAL LOW (ref 39.0–52.0)
Hemoglobin: 11.2 g/dL — ABNORMAL LOW (ref 13.0–17.0)
O2 Saturation: 65 %
Potassium: 4 mmol/L (ref 3.5–5.1)
Sodium: 143 mmol/L (ref 135–145)
TCO2: 36 mmol/L — ABNORMAL HIGH (ref 22–32)
pCO2, Ven: 72.6 mmHg (ref 44.0–60.0)
pH, Ven: 7.279 (ref 7.250–7.430)
pO2, Ven: 40 mmHg (ref 32.0–45.0)

## 2020-08-18 LAB — GLUCOSE, CAPILLARY
Glucose-Capillary: 142 mg/dL — ABNORMAL HIGH (ref 70–99)
Glucose-Capillary: 148 mg/dL — ABNORMAL HIGH (ref 70–99)
Glucose-Capillary: 138 mg/dL — ABNORMAL HIGH (ref 70–99)
Glucose-Capillary: 214 mg/dL — ABNORMAL HIGH (ref 70–99)

## 2020-08-18 LAB — LEGIONELLA PNEUMOPHILA SEROGP 1 UR AG: L. pneumophila Serogp 1 Ur Ag: NEGATIVE

## 2020-08-18 SURGERY — RIGHT/LEFT HEART CATH AND CORONARY ANGIOGRAPHY
Anesthesia: LOCAL

## 2020-08-18 MED ORDER — HEPARIN (PORCINE) IN NACL 1000-0.9 UT/500ML-% IV SOLN
INTRAVENOUS | Status: DC | PRN
  Administered 2020-08-18: 500 mL

## 2020-08-18 MED ORDER — FUROSEMIDE 10 MG/ML IJ SOLN
40.0000 mg | Freq: Two times a day (BID) | INTRAMUSCULAR | Status: DC
  Administered 2020-08-18 – 2020-08-19 (×3): 40 mg via INTRAVENOUS
  Filled 2020-08-18 (×4): qty 4

## 2020-08-18 MED ORDER — SODIUM CHLORIDE 0.9% FLUSH: 3.0000 mL | INTRAVENOUS | Status: DC | PRN

## 2020-08-18 MED ORDER — SODIUM CHLORIDE 0.9 % IV SOLN: INTRAVENOUS | Status: AC

## 2020-08-18 MED ORDER — FENTANYL CITRATE (PF) 100 MCG/2ML IJ SOLN
INTRAMUSCULAR | Status: AC
  Filled 2020-08-18: qty 2

## 2020-08-18 MED ORDER — HEPARIN (PORCINE) IN NACL 1000-0.9 UT/500ML-% IV SOLN
INTRAVENOUS | Status: AC
  Filled 2020-08-18: qty 500

## 2020-08-18 MED ORDER — VERAPAMIL HCL 2.5 MG/ML IV SOLN
INTRAVENOUS | Status: DC | PRN
  Administered 2020-08-18: 10 mL via INTRA_ARTERIAL

## 2020-08-18 MED ORDER — SODIUM CHLORIDE 0.9 % IV SOLN: 250.0000 mL | INTRAVENOUS | Status: DC | PRN

## 2020-08-18 MED ORDER — ASPIRIN 81 MG PO CHEW: 81.0000 mg | CHEWABLE_TABLET | Freq: Every day | ORAL | Status: DC

## 2020-08-18 MED ORDER — LIDOCAINE HCL (PF) 1 % IJ SOLN
INTRAMUSCULAR | Status: AC
  Filled 2020-08-18: qty 30

## 2020-08-18 MED ORDER — FENTANYL CITRATE (PF) 100 MCG/2ML IJ SOLN
INTRAMUSCULAR | Status: DC | PRN
  Administered 2020-08-18 (×2): 25 ug via INTRAVENOUS

## 2020-08-18 MED ORDER — PNEUMOCOCCAL VAC POLYVALENT 25 MCG/0.5ML IJ INJ
0.5000 mL | INJECTION | INTRAMUSCULAR | Status: AC
  Administered 2020-08-19: 0.5 mL via INTRAMUSCULAR
  Filled 2020-08-18: qty 0.5

## 2020-08-18 MED ORDER — IOHEXOL 350 MG/ML SOLN
INTRAVENOUS | Status: DC | PRN
  Administered 2020-08-18: 75 mL

## 2020-08-18 MED ORDER — OXYCODONE HCL 5 MG PO TABS
5.0000 mg | ORAL_TABLET | ORAL | Status: DC | PRN
  Administered 2020-08-18 – 2020-08-23 (×2): 10 mg via ORAL
  Filled 2020-08-18 (×3): qty 2

## 2020-08-18 MED ORDER — LABETALOL HCL 5 MG/ML IV SOLN: 10.0000 mg | INTRAVENOUS | Status: AC | PRN

## 2020-08-18 MED ORDER — ONDANSETRON HCL 4 MG/2ML IJ SOLN: 4.0000 mg | Freq: Four times a day (QID) | INTRAMUSCULAR | Status: DC | PRN

## 2020-08-18 MED ORDER — HEPARIN SODIUM (PORCINE) 1000 UNIT/ML IJ SOLN
INTRAMUSCULAR | Status: AC
  Filled 2020-08-18: qty 1

## 2020-08-18 MED ORDER — HYDRALAZINE HCL 20 MG/ML IJ SOLN: 10.0000 mg | INTRAMUSCULAR | Status: AC | PRN

## 2020-08-18 MED ORDER — MIDAZOLAM HCL 2 MG/2ML IJ SOLN
INTRAMUSCULAR | Status: AC
  Filled 2020-08-18: qty 2

## 2020-08-18 MED ORDER — HEPARIN SODIUM (PORCINE) 5000 UNIT/ML IJ SOLN
5000.0000 [IU] | Freq: Three times a day (TID) | INTRAMUSCULAR | Status: DC
  Administered 2020-08-18 – 2020-08-23 (×13): 5000 [IU] via SUBCUTANEOUS
  Filled 2020-08-18 (×12): qty 1

## 2020-08-18 MED ORDER — LIDOCAINE HCL (PF) 1 % IJ SOLN
INTRAMUSCULAR | Status: DC | PRN
  Administered 2020-08-18: 1 mL
  Administered 2020-08-18: 2 mL

## 2020-08-18 MED ORDER — HEPARIN SODIUM (PORCINE) 1000 UNIT/ML IJ SOLN
INTRAMUSCULAR | Status: DC | PRN
  Administered 2020-08-18: 7000 [IU] via INTRAVENOUS

## 2020-08-18 MED ORDER — SODIUM CHLORIDE 0.9% FLUSH
3.0000 mL | Freq: Two times a day (BID) | INTRAVENOUS | Status: DC
  Administered 2020-08-18 – 2020-08-23 (×8): 3 mL via INTRAVENOUS

## 2020-08-18 MED ORDER — ACETAMINOPHEN 325 MG PO TABS
650.0000 mg | ORAL_TABLET | ORAL | Status: DC | PRN
  Administered 2020-08-22: 650 mg via ORAL
  Filled 2020-08-18: qty 2

## 2020-08-18 MED ORDER — MIDAZOLAM HCL 2 MG/2ML IJ SOLN
INTRAMUSCULAR | Status: DC | PRN
  Administered 2020-08-18: 1 mg via INTRAVENOUS
  Administered 2020-08-18: 0.5 mg via INTRAVENOUS

## 2020-08-18 SURGICAL SUPPLY — 13 items
CATH 5FR JL3.5 JR4 ANG PIG MP (CATHETERS) ×1 IMPLANT
CATH SWAN GANZ 7F STRAIGHT (CATHETERS) ×1 IMPLANT
DEVICE RAD COMP TR BAND LRG (VASCULAR PRODUCTS) ×1 IMPLANT
GLIDESHEATH SLEND A-KIT 6F 22G (SHEATH) ×1 IMPLANT
GLIDESHEATH SLENDER 7FR .021G (SHEATH) ×1 IMPLANT
GUIDEWIRE INQWIRE 1.5J.035X260 (WIRE) IMPLANT
INQWIRE 1.5J .035X260CM (WIRE) ×2
KIT HEART LEFT (KITS) ×2 IMPLANT
MAT PREVALON FULL STRYKER (MISCELLANEOUS) ×1 IMPLANT
PACK CARDIAC CATHETERIZATION (CUSTOM PROCEDURE TRAY) ×2 IMPLANT
SHEATH PROBE COVER 6X72 (BAG) ×1 IMPLANT
TRANSDUCER W/STOPCOCK (MISCELLANEOUS) ×2 IMPLANT
TUBING CIL FLEX 10 FLL-RA (TUBING) ×2 IMPLANT

## 2020-08-18 NOTE — CV Procedure (Signed)
   Severe pulmonary hypertension, mean pressure 48 mmHg.  Capillary wedge pressure mean 34 mmHg.  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  Widely patent/normal coronary arteries.  High output state: Thermodilution 9.23 L/min; Fick 9.06 L/min  Peak to peak aortic valve gradient 33 mmHg; mean gradient 22 mmHg  Aortic valve area using Fick output 1.94 cm and thermal dilution output 1.98 cm  Right atrial mean pressure 19 mmHg

## 2020-08-18 NOTE — Interval H&P Note (Signed)
Cath Lab Visit (complete for each Cath Lab visit)  Clinical Evaluation Leading to the Procedure:   ACS: No.  Non-ACS:    Anginal Classification: CCS II  Anti-ischemic medical therapy: Minimal Therapy (1 class of medications)  Non-Invasive Test Results: No non-invasive testing performed  Prior CABG: No previous CABG      History and Physical Interval Note:  08/18/2020 12:39 PM  Lawrence Rose  has presented today for surgery, with the diagnosis of unstable angina.  The various methods of treatment have been discussed with the patient and family. After consideration of risks, benefits and other options for treatment, the patient has consented to  Procedure(s): RIGHT/LEFT HEART CATH AND CORONARY ANGIOGRAPHY (N/A) as a surgical intervention.  The patient's history has been reviewed, patient examined, no change in status, stable for surgery.  I have reviewed the patient's chart and labs.  Questions were answered to the patient's satisfaction.     Lyn Records III

## 2020-08-18 NOTE — H&P (View-Only) (Signed)
Progress Note  Patient Name: Lawrence Rose Date of Encounter: 08/18/2020  Primary Cardiologist: Riley Lam MD  Subjective   Lawrence Rose is a 64 y.o. male with Diabetes with HTN, HLD, Morbid Obesity and OSA on CPAP, and CKD Stage III who presented for troponin elevation 08/16/20.  Found to have severe AS.  Patient notes that his shortness of breath is better but still persistent.  No chest pain.  Eager for [facility] to find out the next step.  Inpatient Medications    Scheduled Meds: . aspirin EC  81 mg Oral Daily  . chlorhexidine  15 mL Mouth Rinse BID  . cholecalciferol  1,000 Units Oral QHS  . cloNIDine  0.1 mg Oral BID  . diltiazem  240 mg Oral Daily  . folic acid  1 mg Oral Daily  . gabapentin  600 mg Oral BID  . insulin aspart  0-9 Units Subcutaneous TID WC  . latanoprost  1 drop Both Eyes QHS  . lisinopril  40 mg Oral QHS  . mouth rinse  15 mL Mouth Rinse q12n4p  . metoprolol tartrate  12.5 mg Oral BID  . multivitamin with minerals  1 tablet Oral QHS  . oxyCODONE  10 mg Oral Q12H  . pantoprazole  40 mg Oral BID  . rosuvastatin  20 mg Oral Daily  . sodium chloride flush  3 mL Intravenous Q12H   Continuous Infusions: . sodium chloride 140 mL/hr at 08/18/20 0555  . sodium chloride    . sodium chloride 1 mL/kg/hr (08/18/20 0529)  . heparin 1,250 Units/hr (08/18/20 0555)   PRN Meds: sodium chloride, sodium chloride, acetaminophen, HYDROcodone-acetaminophen, nitroGLYCERIN, ondansetron (ZOFRAN) IV, sodium chloride flush   Vital Signs    Vitals:   08/17/20 1615 08/17/20 1952 08/17/20 2305 08/18/20 0529  BP: 137/76 139/73  137/84  Pulse: 94 95 92 89  Resp: 18 20 16 16   Temp: 98.9 F (37.2 C) 98.6 F (37 C)  98.5 F (36.9 C)  TempSrc: Oral Oral  Oral  SpO2: 96% 98% 95% 97%  Weight:    (!) 140 kg  Height:        Intake/Output Summary (Last 24 hours) at 08/18/2020 1016 Last data filed at 08/18/2020 0855 Gross per 24 hour  Intake 1478.13 ml  Output  1895 ml  Net -416.87 ml   Filed Weights   08/16/20 1414 08/17/20 0500 08/18/20 0529  Weight: (!) 141.7 kg (!) 140.8 kg (!) 140 kg    Telemetry    Sinus rhythm to sinus tachycardia with occasional PVCs and PACs- Personally Reviewed  ECG    08/18/20 SR rate 90 with low voltage limb leads and occasional PACs - Personally Reviewed  Physical Exam   GEN: No acute distress.   Neck: Mild JVD to lower third of neck Cardiac: RRR, II/VI systolic murmur with no rubs, or gallops.  Respiratory: Clear to auscultation bilaterally. GI: Soft, nontender, non-distended  MS: +1 edema; No deformity. Neuro:  Nonfocal  Psych: Normal affect   Labs    Chemistry Recent Labs  Lab 08/15/20 1801 08/15/20 1801 08/16/20 1459 08/17/20 0351 08/18/20 0320  NA 140   < > 139 139 139  K 4.4   < > 4.2 3.9 4.2  CL 100   < > 95* 99 100  CO2 29   < > 29 28 30   GLUCOSE 120*   < > 209* 166* 158*  BUN 17   < > 22 23 23   CREATININE 1.38*   < >  1.41* 1.43* 1.43*  CALCIUM 9.2   < > 9.5 8.9 8.7*  PROT 7.7  --   --   --   --   ALBUMIN 3.7  --   --   --   --   AST 17  --   --   --   --   ALT 16  --   --   --   --   ALKPHOS 50  --   --   --   --   BILITOT 0.4  --   --   --   --   GFRNONAA 56*   < > 55* 54* 54*  ANIONGAP 11   < > 15 12 9    < > = values in this interval not displayed.     Hematology Recent Labs  Lab 08/16/20 1459 08/17/20 0351 08/18/20 0320  WBC 10.8* 13.9* 12.3*  RBC 4.31 4.02* 3.94*  HGB 10.6* 10.1* 9.8*  HCT 36.1* 33.9* 33.9*  MCV 83.8 84.3 86.0  MCH 24.6* 25.1* 24.9*  MCHC 29.4* 29.8* 28.9*  RDW 15.1 15.3 15.3  PLT 340 322 307   Cardiac Enzymes Novel Troponin:  164-> 175-> 176 08/16/20 2015.  BNP Recent Labs  Lab 08/15/20 1801 08/16/20 2105  BNP 152.3* 246.2*     DDimer  Recent Labs  Lab 08/16/20 1459  DDIMER 1.02*     Radiology    ECHOCARDIOGRAM COMPLETE  Result Date: 08/17/2020    ECHOCARDIOGRAM REPORT   [name] Date of Exam:  08/17/2020 Medical Rec #:  [id number]/10/[id number]      Height:       71.0 in Accession #:    [id number]     Weight:       310.3 lb Date of Birth:  1955-11-24      BSA:          2.541 m Patient Age:    64 years       BP:           138/83 mmHg Patient Gender: M              HR:           97 bpm. Exam Location:  Inpatient Procedure: 2D Echo, Color Doppler and Cardiac Doppler Indications:    NSTEMI  History:        Patient has prior history of Echocardiogram examinations, most                 recent 08/10/2018. Risk Factors:Hypertension and Diabetes. Prior                 performed at [facility].  Sonographer:    [facility] Senior RDCS Referring Phys: Irving Burton SARA-MAIZ A THOMAS IMPRESSIONS  1. Left ventricular ejection fraction, by estimation, is 60 to 65%. The left ventricle has normal function. The left ventricle has no regional wall motion abnormalities. There is moderate concentric left ventricular hypertrophy. Left ventricular diastolic parameters are indeterminate.  2. Right ventricular systolic function is normal. The right ventricular size is normal.  3. Left atrial size was severely dilated.  4. The mitral valve is normal in structure. Trivial mitral valve regurgitation. The posterior leaflet appears to have limited mobility with mild flow acceleration noted across the mitral valve with mean gradient [id number] at HR 101. There is mild narrowing of the mitral valve orfice without significant stenosis.  5. The aortic valve is tricuspid. There is severe calcifcation of the aortic valve. There  is severe thickening of the aortic valve. Aortic valve regurgitation is not visualized. Severe aortic valve stenosis with AVA 0.9 (by continuity), mean gradient , peak gradient , Vmax 4.65cm2, DOI 0.2.  6. The inferior vena cava is dilated in size with >50% respiratory variability, suggesting right atrial pressure of 8 mmHg. FINDINGS  Left Ventricle: Left ventricular ejection fraction, by estimation, is 60 to 65%. The left ventricle has  normal function. The left ventricle has no regional wall motion abnormalities. The left ventricular internal cavity size was normal in size. There is  moderate concentric left ventricular hypertrophy. Left ventricular diastolic parameters are indeterminate. Right Ventricle: The right ventricular size is normal. Right vetricular wall thickness was not well visualized. Right ventricular systolic function is normal. Left Atrium: Left atrial size was severely dilated. Right Atrium: Right atrial size was normal in size. Pericardium: There is no evidence of pericardial effusion. Mitral Valve: The mitral valve is normal in structure. There is mild thickening of the mitral valve leaflet(s). Trivial mitral valve regurgitation. The posterior leaflet appears to have limited mobility with mild flow acceleration noted across the mitral  valve with mean gradient at HR 101. There is mild narrowing fo the mitral valve orfice without significant stenosis. MV peak gradient, 10.1 mmHg. The mean mitral valve gradient is 6.0 mmHg. Tricuspid Valve: The tricuspid valve is normal in structure. Tricuspid valve regurgitation is trivial. Aortic Valve: The aortic valve is tricuspid. There is severe calcifcation of the aortic valve. There is severe thickening of the aortic valve. Aortic valve regurgitation is not visualized. Severe aortic stenosis is present. Aortic valve mean gradient measures 51.0 mmHg. Aortic valve peak gradient measures 83.2 mmHg. Aortic valve area, by VTI measures 0.89 cm. DOI 0.2. Pulmonic Valve: The pulmonic valve was normal in structure. Pulmonic valve regurgitation is trivial. Aorta: The aortic root and ascending aorta are structurally normal, with no evidence of dilitation. Venous: The inferior vena cava is dilated in size with greater than 50% respiratory variability, suggesting right atrial pressure of 8 mmHg. IAS/Shunts: No atrial level shunt detected by color flow Doppler.  LEFT VENTRICLE PLAX 2D LVIDd:          4.29 cm  Diastology LVIDs:         2.46 cm  LV e' medial:  8.05 cm/s LV PW:         1.61 cm  LV e' lateral: 10.40 cm/s LV IVS:        1.24 cm LVOT diam:     2.40 cm LV SV:         90 LV SV Index:   36 LVOT Area:     4.52 cm  RIGHT VENTRICLE RV S prime:     10.30 cm/s TAPSE (M-mode): 2.0 cm LEFT ATRIUM              Index       RIGHT ATRIUM           Index LA diam:        3.70 cm  1.46 cm/m  RA Area:     23.50 cm LA Vol (A2C):   108.0 ml 42.50 ml/m RA Volume:   75.60 ml  29.75 ml/m LA Vol (A4C):   123.0 ml 48.40 ml/m LA Biplane Vol: 120.0 ml 47.22 ml/m  AORTIC VALVE AV Area (Vmax):    0.97 cm AV Area (Vmean):   1.18 cm AV Area (VTI):     0.89 cm AV Vmax:  456.00 cm/s AV Vmean:          268.000 cm/s AV VTI:            1.017 m AV Peak Grad:      83.2 mmHg AV Mean Grad:      51.0 mmHg LVOT Vmax:         97.50 cm/s LVOT Vmean:        70.000 cm/s LVOT VTI:          0.200 m LVOT/AV VTI ratio: 0.20  AORTA Ao Root diam: 3.20 cm Ao Asc diam:  3.40 cm MITRAL VALVE MV Peak grad: 10.1 mmHg  SHUNTS MV Mean grad: 6.0 mmHg   Systemic VTI:  0.20 m MV Vmax:      1.59 m/s   Systemic Diam: 2.40 cm MV Vmean:     117.0 cm/s Laurance Flatten MD Electronically signed by Laurance Flatten MD Signature Date/Time: 08/17/2020/12:41:56 PM    Final    VAS [place] LOWER EXTREMITY VENOUS (DVT)  Result Date: 08/17/2020  Lower Venous DVTStudy Indications: Swelling.  Limitations: Body habitus and poor ultrasound/tissue interface. Comparison Study: No prior study Performing Technologist: Gertie Fey MHA, RDMS, RVT, RDCS  Examination Guidelines: A complete evaluation includes B-mode imaging, spectral Doppler, color Doppler, and power Doppler as needed of all accessible portions of each vessel. Bilateral testing is considered an integral part of a complete examination. Limited examinations for reoccurring indications may be performed as noted. The reflux portion of the exam is performed with the patient in reverse  Trendelenburg.  +---------+---------------+---------+-----------+----------+--------------+ RIGHT    CompressibilityPhasicitySpontaneityPropertiesThrombus Aging +---------+---------------+---------+-----------+----------+--------------+ CFV      Full           Yes      Yes                                 +---------+---------------+---------+-----------+----------+--------------+ SFJ      Full                                                        +---------+---------------+---------+-----------+----------+--------------+ FV Prox  Full                                                        +---------+---------------+---------+-----------+----------+--------------+ FV Mid   Full                                                        +---------+---------------+---------+-----------+----------+--------------+ FV DistalFull                                                        +---------+---------------+---------+-----------+----------+--------------+ PFV      Full                                                        +---------+---------------+---------+-----------+----------+--------------+  POP      Full           Yes      Yes                                 +---------+---------------+---------+-----------+----------+--------------+ PTV      Full                                                        +---------+---------------+---------+-----------+----------+--------------+ PERO     Full                                                        +---------+---------------+---------+-----------+----------+--------------+   +---------+---------------+---------+-----------+----------+--------------+ LEFT     CompressibilityPhasicitySpontaneityPropertiesThrombus Aging +---------+---------------+---------+-----------+----------+--------------+ CFV      Full           Yes      Yes                                  +---------+---------------+---------+-----------+----------+--------------+ SFJ      Full                                                        +---------+---------------+---------+-----------+----------+--------------+ FV Prox  Full                                                        +---------+---------------+---------+-----------+----------+--------------+ FV Mid   Full                                                        +---------+---------------+---------+-----------+----------+--------------+ FV DistalFull                                                        +---------+---------------+---------+-----------+----------+--------------+ PFV      Full                                                        +---------+---------------+---------+-----------+----------+--------------+ POP      Full           Yes      Yes                                 +---------+---------------+---------+-----------+----------+--------------+  PTV      Full                                                        +---------+---------------+---------+-----------+----------+--------------+ PERO     Full                                                        +---------+---------------+---------+-----------+----------+--------------+     Summary: RIGHT: - There is no evidence of deep vein thrombosis in the lower extremity.  - No cystic structure found in the popliteal fossa.  LEFT: - There is no evidence of deep vein thrombosis in the lower extremity.  - No cystic structure found in the popliteal fossa.  *See table(s) above for measurements and observations. Electronically signed by Fabienne Brunsharles Fields MD on 08/17/2020 at 7:11:02 PM.    Final     Cardiac Studies   As above  Patient Profile     64 yo M with symptomatic severe AS and mitral valve gradients 6 mm HG  Assessment & Plan    Severe Symptomatic Aortic Stenosis NSTEMI Morbid Obesity HTN HLD CKD Stage III - -  with SOB and HF sx; elevated troponin and BNP - continue ASA and heparin - continue statin - continue ACEi - on schedule for LCP today - will call CT surgery and/or Heart Team based on results. - would like to start low level diuresis and beta blockage (12.5 mg metoprolol BID and 40 mg lasix IV daily) waiting until after LCP and contrast load  Discussed with patient and primary MD  For questions or updates, please contact [facility] Please consult [email or web address] for contact info under Cardiology/STEMI.      Signed, Christell ConstantMahesh A Santonio Speakman, MD  08/18/2020, 10:16 AM

## 2020-08-18 NOTE — Progress Notes (Signed)
ANTICOAGULATION CONSULT NOTE - Follow Up Consult  Pharmacy Consult for heparin Indication: NSTEMI  Labs: Recent Labs    08/16/20 1459 08/16/20 1459 08/16/20 1815 08/16/20 2105 08/17/20 0351 08/18/20 0320  HGB 10.6*   < >  --   --  10.1* 9.8*  HCT 36.1*  --   --   --  33.9* 33.9*  PLT 340  --   --   --  322 307  HEPARINUNFRC 0.55  --   --   --  0.40 0.78*  CREATININE 1.41*  --   --   --  1.43* 1.43*  TROPONINIHS 148*  --  164* 176*  175*  --   --    < > = values in this interval not displayed.    Assessment: 64yo male supratherapeutic on heparin after several levels at goal; no gtt issues or signs of bleeding per RN.  Goal of Therapy:  Heparin level 0.3-0.7 units/ml   Plan:  Will decrease heparin gtt by 1 unit/kgABW/hr to 1250 units/hr and check level in 6 hours.    Vernard Gambles, PharmD, BCPS  08/18/2020,5:15 AM

## 2020-08-18 NOTE — Consult Note (Addendum)
[name]  Cardiology Consultation:   Patient ID: Lawrence Rose MRN: [id number]; DOB: 1956-08-27  Admit date: 08/15/2020 Date of Consult: 08/19/2020  Primary Care Provider: [name] No [facility] Cardiologist: New - previously seen by [name] - [name] Electrophysiologist:  None    Patient Profile:   Lawrence Rose is a 64 y.o. male with a hx of HTN, HLD, anemia, morbid obesity, chronic back and knee pain with limited mobility, OSA on CPAP, CKD stage III, mitral stenosis, pulmonary HTN, and aortic stenosis who is being seen today for the evaluation of severe AS at the request of Dr. [name].  History of Present Illness:   [name] is widowed and lives alone in [place], [place].  He has 3 children, but he is not very close with them.  He does have good social support with a local sister and brother in [place].  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  He also suffers from advanced bilateral knee osteoarthritis.  Due to his orthopedic issues and morbid obesity, he has very limited mobility and has to walk with the aid of a walker or cane.  The patient is sedentary at baseline, but is still able to drive and take care of his own ADLs.  The most exercise he gets is walking out to his car to drive to the store, church or medical appointments.  Patient was recently seen by a dentist who extracted all of his problematic teeth and made him partial dentures.  Of note, he was vaccinated with the [place] for COVID-19.  The patient is not followed by cardiology and has no previous cardiac history.  He reports being told he had a murmur at least 2 years ago.  He was seen previously by Dr. [name] in 2016 for pre op clearance before colonoscopy due to reportedly abnormal ECG. Myoview was abnormal and follow up cath was  normal. There is an echo in care everywhere from 08/10/2018 from [facility] that reports normal LV function with an EF of 55 to 60%, normal aortic valve and mild mitral stenosis.  The patient reports slowing down over the past 6 months with worsening fatigue and dyspnea on exertion. Over the past week or so he has noted worsening in his shortness of breath to the point that he could not walk to the bathroom without significant dyspnea.  He reported "knowing something was wrong" and called his brother to bring him to [name] for evaluation.  In the ED, BNP was noted to be elevated to 246.2, HS troponin 330--> 175, hemoglobin 10.1, WBC 8.4, TSH 0.0176, T4 0.77, respiratory panel negative, hemoglobin A1c 5.8, D-dimer 1.02, CXR with possible bilateral infiltrates, CT angio with no evidence of a pulmonary embolus, bilateral pleural effusions and patchy bilateral groundglass opacities  in the upper lobes concerning for pneumonia, lower extremity Dopplers negative for DVT.  Transthoracic echocardiogram showed EF 60 to 65%, moderate concentric LVH, indeterminate diastolic function, severe left atrial dilation, mild mitral stenosis (mean gradient of 6 mm Hg at HR of 101bpm), severe aortic valve stenosis with an AVA 0.9 cm, mean gradient 51 mmHg, peak gradient 82 mmHg, V-max 4.65 cm, DVI 0.2. He was started on IV heparin, Lasix, Rocephin and azithromycin and transferred to [facility] for further evaluation and treatment.  He underwent left and  right heart catheterization on 08/18/2020 which showed widely patent coronary arteries, high cardiac output at 9.06 L/min by Fick and 9.23 L/min by thermal dilution, severe pulmonary hypertension with a mean pressure of 48 mmHg, pulmonary capillary wedge pressure mean 34 mmHg, pulmonary vascular resistance 1.55 Woods units, mean right atrial pressure 19 mmHg, aortic valve peak gradient of 33 mmHg, mean gradient 22 mmHg, aortic valve area 1.94 cm by Fick and 1.98 cm by thermal  dilution, LVEDP 20 mmHg. Further diuresis was recommended.  There was some question of his high cardiac output being related to possible thyrotoxicosis as he was noted to have a suppressed TSH with a normal T4.  T3 currently pending. ABG from 11/2 showed PH 7.281, pCO2 71, P02 80. Follow up TEE today showed EF 75%, severe LVH, severe AS with a mean gradient of 42 mm Hg and mild AI, trivial MR and moderate plaque in descending aorta. Cardiac gated CTA of the heart reveals anatomical characteristics consistent with aortic stenosis suitable for treatment by transcatheter aortic valve replacement without any significant complicating features except some nodular calcium in the annulus at the base of the LCC and CTA of the aorta and iliac vessels demonstrate what appear to be adequate pelvic vascular access to facilitate a transfemoral approach.     The structural heart team is consulted for consideration of aortic valve replacement +/- mitral valve replacement.  Today the patient is seen sitting up in bed.  He is feeling better after IV diuresis, but continues to have shortness of breath.  He denies chest pain, dizziness or syncope, blood in his stool or urine.  He has occasional lower extremity edema for which he takes Lasix.  He reports his Lasix being cut in half about a month ago given evidence of dehydration.  He denies PND or orthopnea, but says he sleeps with his CPAP nightly.  He feels palpitations like his heart is racing with even mild level of activity, like walking across the room.  Past Medical History:  Diagnosis Date  . Apnea   . Chronic back pain   . Chronic knee pain   . Diabetes mellitus without complication (HCC)   . Hypertension     Past Surgical History:  Procedure Laterality Date  . BACK SURGERY    . RIGHT/LEFT HEART CATH AND CORONARY ANGIOGRAPHY N/A 08/18/2020   Procedure: RIGHT/LEFT HEART CATH AND CORONARY ANGIOGRAPHY;  Surgeon: Lyn Records, MD;  Location: [facility] INVASIVE CV LAB;   Service: Cardiovascular;  Laterality: N/A;     Home Medications:  Prior to Admission medications   Medication Sig Start Date End Date Taking? Authorizing Provider  albuterol (VENTOLIN HFA) 108 (90 Base) MCG/ACT inhaler Inhale 2 puffs into the lungs every 4 (four) hours as needed for wheezing or shortness of breath.  08/22/16  Yes [provider]  aspirin EC 81 MG tablet Take 81 mg by mouth at bedtime. Swallow whole.   Yes [provider]  Cholecalciferol 25 MCG (1000 UT) tablet Take 1,000 Units by mouth at bedtime.    Yes [provider]  cloNIDine (CATAPRES) 0.1 MG tablet Take 0.1 mg by mouth 2 (two) times daily.  06/15/20  Yes [provider]  diltiazem (CARDIZEM CD) 240 MG 24 hr capsule Take 240 mg by mouth daily. 07/07/20  Yes [provider]  diphenhydrAMINE (BENADRYL) 25 MG tablet Take 25 mg by mouth daily.   Yes [provider]  folic acid (FOLVITE) 1 MG tablet Take 1 mg by  mouth daily. 07/10/20  Yes [provider]  furosemide (LASIX) 40 MG tablet Take 20 mg by mouth daily.  06/18/20  Yes [provider]  gabapentin (NEURONTIN) 300 MG capsule Take 600 mg by mouth 2 (two) times daily.  08/12/20  Yes [provider]  HYDROcodone-acetaminophen (NORCO) 10-325 MG tablet Take 1 tablet by mouth every 6 (six) hours as needed (pain).  08/04/20  Yes [provider]  latanoprost (XALATAN) 0.005 % ophthalmic solution Place 1 drop into both eyes at bedtime.  07/27/20  Yes [provider]  lisinopril (ZESTRIL) 40 MG tablet Take 40 mg by mouth at bedtime.  05/25/20  Yes [provider]  metFORMIN (GLUCOPHAGE) 1000 MG tablet Take 1,000 mg by mouth 2 (two) times daily. 07/10/20  Yes [provider]  Multiple Vitamin (MULTIVITAMIN WITH MINERALS) TABS tablet Take 1 tablet by mouth at bedtime.   Yes [provider]  oxyCODONE ER (XTAMPZA ER) 9 MG C12A Take 9 mg by mouth every 12 (twelve)  hours.   Yes [provider]  pantoprazole (PROTONIX) 40 MG tablet Take 40 mg by mouth 2 (two) times daily. 05/26/20  Yes [provider]  potassium chloride SA (KLOR-CON) 20 MEQ tablet Take 20 mEq by mouth daily. 07/07/20  Yes [provider]  [place] into the lungs at bedtime. CPAP   Yes [provider]  rosuvastatin (CRESTOR) 20 MG tablet Take 20 mg by mouth daily.  05/25/20  Yes [provider]  Blood Glucose Monitoring Suppl (FIFTY50 GLUCOSE METER 2.0) w/Device KIT See admin instructions. 05/16/19   [provider]    Inpatient Medications: Scheduled Meds: . aspirin EC  81 mg Oral Daily  . chlorhexidine  15 mL Mouth Rinse BID  . cholecalciferol  1,000 Units Oral QHS  . cloNIDine  0.1 mg Oral BID  . diltiazem  240 mg Oral Daily  . folic acid  1 mg Oral Daily  . furosemide  40 mg Intravenous BID  . gabapentin  600 mg Oral BID  . heparin  5,000 Units Subcutaneous Q8H  . insulin aspart  0-9 Units Subcutaneous TID WC  . latanoprost  1 drop Both Eyes QHS  . lisinopril  40 mg Oral QHS  . mouth rinse  15 mL Mouth Rinse q12n4p  . metoprolol tartrate  25 mg Oral BID  . multivitamin with minerals  1 tablet Oral QHS  . oxyCODONE  10 mg Oral Q12H  . pantoprazole  40 mg Oral BID  . polyethylene glycol  17 g Oral Daily  . rosuvastatin  20 mg Oral Daily  . sodium chloride flush  3 mL Intravenous Q12H   Continuous Infusions: . sodium chloride 140 mL/hr at 08/18/20 0918  . sodium chloride     PRN Meds: sodium chloride, sodium chloride, acetaminophen, HYDROcodone-acetaminophen, nitroGLYCERIN, ondansetron (ZOFRAN) IV, oxyCODONE, sodium chloride flush  Allergies:    Allergies  Allergen Reactions  . Cocoa Hives  . Sulfamethoxazole-Trimethoprim Anaphylaxis  . Chocolate Hives  . Citrus Hives    Reaction to oranges and orange juice  . Fish Allergy Hives and Swelling    Throat swelling  . Shellfish Allergy Hives and  Swelling    Throat swelling  . Sulfa Antibiotics Hives and Swelling    Throat swelling  . Tetracycline Swelling    Social History:   Social History   Socioeconomic History  . Marital status: Single    Spouse name: Not on file  . Number of children:  Not on file  . Years of education: Not on file  . Highest education level: Not on file  Occupational History  . Not on file  Tobacco Use  . Smoking status: Never Smoker  . Smokeless tobacco: Never Used  Substance and Sexual Activity  . Alcohol use: No  . Drug use: Not Currently  . Sexual activity: Not on file  Other Topics Concern  . Not on file  Social History Narrative  . Not on file   Social Determinants of Health   Financial Resource Strain:   . Difficulty of Paying Living Expenses: Not on file  Food Insecurity:   . Worried About Charity fundraiser in the Last Year: Not on file  . Ran Out of Food in the Last Year: Not on file  Transportation Needs:   . Lack of Transportation (Medical): Not on file  . Lack of Transportation (Non-Medical): Not on file  Physical Activity:   . Days of Exercise per Week: Not on file  . Minutes of Exercise per Session: Not on file  Stress:   . Feeling of Stress : Not on file  Social Connections:   . Frequency of Communication with Friends and Family: Not on file  . Frequency of Social Gatherings with Friends and Family: Not on file  . Attends Religious Services: Not on file  . Active Member of Clubs or Organizations: Not on file  . Attends Archivist Meetings: Not on file  . Marital Status: Not on file  Intimate Partner Violence:   . Fear of Current or Ex-Partner: Not on file  . Emotionally Abused: Not on file  . Physically Abused: Not on file  . Sexually Abused: Not on file    Family History:   No family history on file.   ROS:  Please see the history of present illness.  All other ROS reviewed and negative.     Physical Exam/Data:   Vitals:   08/18/20 1445  08/18/20 1955 08/19/20 0047 08/19/20 0419  BP: 128/69 127/73 123/70 111/66  Pulse: 78 94 81 81  Resp:  $Remo'20 20 20  'oTkVo$ Temp:  98.8 F (37.1 C)  98.4 F (36.9 C)  TempSrc:  Oral    SpO2:  97% 96% 99%  Weight:    (!) 140.4 kg  Height:        Intake/Output Summary (Last 24 hours) at 08/19/2020 1243 Last data filed at 08/19/2020 0700 Gross per 24 hour  Intake 2120.89 ml  Output 3050 ml  Net -929.11 ml   Last 3 Weights 08/19/2020 08/18/2020 08/17/2020  Weight (lbs) 309 lb 8 oz 308 lb 10.3 oz 310 lb 4.8 oz  Weight (kg) 140.388 kg 140 kg 140.751 kg     Body mass index is 43.17 kg/m.  General:  Well nourished, well developed, in no acute distress, morbidly obese HEENT: normal Lymph: no adenopathy Neck: Difficult to assess JVD due to body habitus Cardiac:  normal S1, S2; RRR; 3 out of 6 harsh systolic ejection murmur at RUSB.  Murmur heard at apex as well. Lungs:  clear to auscultation bilaterally, no wheezing, rhonchi or rales  Abd: soft, nontender, no hepatomegaly  Ext: Trace bilateral lower extremity edema Musculoskeletal:  No deformities, BUE and BLE strength normal and equal Skin: warm and dry  Neuro:  CNs 2-12 intact, no focal abnormalities noted Psych:  Normal affect   EKG:  The EKG was personally reviewed and demonstrates: Sinus with retrograde P waves and PACs.  Low  voltage QRS.  Heart rate 93 Telemetry:  Telemetry was personally reviewed and demonstrates: Mild sinus tach with PACs.  Heart rates in low 100s  Relevant CV Studies: Echo 08/17/20 IMPRESSIONS 1. Left ventricular ejection fraction, by estimation, is 60 to 65%. The  left ventricle has normal function. The left ventricle has no regional  wall motion abnormalities. There is moderate concentric left ventricular  hypertrophy. Left ventricular  diastolic parameters are indeterminate.  2. Right ventricular systolic function is normal. The right ventricular  size is normal.  3. Left atrial size was severely dilated.   4. The mitral valve is normal in structure. Trivial mitral valve  regurgitation. The posterior leaflet appears to have limited mobility with  mild flow acceleration noted across the mitral valve with mean gradient  29mmHg at HR 101. There is mild narrowing  of the mitral valve orfice without significant stenosis.  5. The aortic valve is tricuspid. There is severe calcifcation of the  aortic valve. There is severe thickening of the aortic valve. Aortic valve  regurgitation is not visualized. Severe aortic valve stenosis with AVA 0.9  (by continuity), mean gradient  10mmHg, peak gradient 79mmHg, Vmax 4.65cm2, DOI 0.2.  6. The inferior vena cava is dilated in size with >50% respiratory  variability, suggesting right atrial pressure of 8 mmHg.   FINDINGS  Left Ventricle: Left ventricular ejection fraction, by estimation, is 60  to 65%. The left ventricle has normal function. The left ventricle has no  regional wall motion abnormalities. The left ventricular internal cavity  size was normal in size. There is  moderate concentric left ventricular hypertrophy. Left ventricular  diastolic parameters are indeterminate.   Right Ventricle: The right ventricular size is normal. Right vetricular  wall thickness was not well visualized. Right ventricular systolic  function is normal.   Left Atrium: Left atrial size was severely dilated.   Right Atrium: Right atrial size was normal in size.   Pericardium: There is no evidence of pericardial effusion.   Mitral Valve: The mitral valve is normal in structure. There is mild  thickening of the mitral valve leaflet(s). Trivial mitral valve  regurgitation. The posterior leaflet appears to have limited mobility with  mild flow acceleration noted across the mitral  valve with mean gradient 53mmHg at HR 101. There is mild narrowing fo the  mitral valve orfice without significant stenosis. MV peak gradient, 10.1  mmHg. The mean mitral valve gradient  is 6.0 mmHg.   Tricuspid Valve: The tricuspid valve is normal in structure. Tricuspid  valve regurgitation is trivial.   Aortic Valve: The aortic valve is tricuspid. There is severe calcifcation  of the aortic valve. There is severe thickening of the aortic valve.  Aortic valve regurgitation is not visualized. Severe aortic stenosis is  present. Aortic valve mean gradient  measures 51.0 mmHg. Aortic valve peak gradient measures 83.2 mmHg. Aortic  valve area, by VTI measures 0.89 cm. DOI 0.2.   Pulmonic Valve: The pulmonic valve was normal in structure. Pulmonic valve  regurgitation is trivial.   Aorta: The aortic root and ascending aorta are structurally normal, with  no evidence of dilitation.   Venous: The inferior vena cava is dilated in size with greater than 50%  respiratory variability, suggesting right atrial pressure of 8 mmHg.   IAS/Shunts: No atrial level shunt detected by color flow Doppler.     LEFT VENTRICLE  PLAX 2D  LVIDd:     4.29 cm Diastology  LVIDs:  2.46 cm LV e' medial: 8.05 cm/s  LV PW:     1.61 cm LV e' lateral: 10.40 cm/s  LV IVS:    1.24 cm  LVOT diam:   2.40 cm  LV SV:     90  LV SV Index:  36  LVOT Area:   4.52 cm     RIGHT VENTRICLE  RV S prime:   10.30 cm/s  TAPSE (M-mode): 2.0 cm   LEFT ATRIUM       Index    RIGHT ATRIUM      Index  LA diam:    3.70 cm 1.46 cm/m RA Area:   23.50 cm  LA Vol (A2C):  108.0 ml 42.50 ml/m RA Volume:  75.60 ml 29.75 ml/m  LA Vol (A4C):  123.0 ml 48.40 ml/m  LA Biplane Vol: 120.0 ml 47.22 ml/m  AORTIC VALVE  AV Area (Vmax):  0.97 cm  AV Area (Vmean):  1.18 cm  AV Area (VTI):   0.89 cm  AV Vmax:      456.00 cm/s  AV Vmean:     268.000 cm/s  AV VTI:      1.017 m  AV Peak Grad:   83.2 mmHg  AV Mean Grad:   51.0 mmHg  LVOT Vmax:     97.50 cm/s  LVOT Vmean:    70.000 cm/s  LVOT VTI:     0.200 m   LVOT/AV VTI ratio: 0.20    AORTA  Ao Root diam: 3.20 cm  Ao Asc diam: 3.40 cm   MITRAL VALVE  MV Peak grad: 10.1 mmHg SHUNTS  MV Mean grad: 6.0 mmHg  Systemic VTI: 0.20 m  MV Vmax:   1.59 m/s  Systemic Diam: 2.40 cm  MV Vmean:   117.0 cm/s   Gwyndolyn Kaufman MD  Electronically signed by Gwyndolyn Kaufman MD  Signature Date/Time: 08/17/2020/12:41:56 PM   _____________________    [facility] 08/18/20 Procedures RIGHT/LEFT HEART CATH AND CORONARY ANGIOGRAPHY  Conclusion   Widely patent coronary arteries  High cardiac output: 9.06 L/min by Fick and 9.23 L/min by thermal dilution.  Etiology uncertain.  Suppressed TSH with normal T4 raising question of T3 thyrotoxicosis versus other mechanism.  Severe pulmonary hypertension, mean pressure 48 mmHg, based upon hemodynamics WHO group 2  Pulmonary capillary wedge pressure mean 34 mmHg, pulmonary vascular resistance 1.55 Woods units.  Mean right atrial pressure 19 mmHg  Aortic valve gradient peak to peak 33 mmHg with mean gradient 22 mmHg.  Aortic valve area 1.94 cm by Fick cardiac output and 1.98 cm by thermodilution  LV function normal by echocardiography with moderate LVH  RECOMMENDATIONS:   Further work-up/management per treating team.  Needs further diuresis.  T3 level  ____________________   TEE 08/21/20 IMPRESSIONS  1. Severe AS (mean gradient 42 mmHg and peak velocity of 4.2 m/s); mild  AI.  2. Left ventricular ejection fraction, by estimation, is 70 to 75%. The  left ventricle has hyperdynamic function. There is severe left ventricular  hypertrophy.  3. Right ventricular systolic function is normal. The right ventricular  size is normal.  4. Left atrial size was moderately dilated. No left atrial/left atrial  appendage thrombus was detected.  5. The mitral valve is normal in structure. Trivial mitral valve  regurgitation.  6. The aortic valve is tricuspid. Aortic valve regurgitation is  mild.  Severe aortic valve stenosis.  7. There is Moderate (Grade III) plaque involving the descending aorta.   FINDINGS  Left Ventricle: Left ventricular  ejection fraction, by estimation, is 70  to 75%. The left ventricle has hyperdynamic function. The left ventricular  internal cavity size was normal in size. There is severe left ventricular  hypertrophy.   Right Ventricle: The right ventricular size is normal. Right vetricular  wall thickness was not assessed. Right ventricular systolic function is  normal.   Left Atrium: Left atrial size was moderately dilated. No left atrial/left  atrial appendage thrombus was detected.   Right Atrium: Right atrial size was normal in size.   Pericardium: There is no evidence of pericardial effusion.   Mitral Valve: The mitral valve is normal in structure. Mild mitral annular  calcification. Trivial mitral valve regurgitation. MV peak gradient, 5.8  mmHg. The mean mitral valve gradient is 2.0 mmHg.   Tricuspid Valve: The tricuspid valve is normal in structure. Tricuspid  valve regurgitation is mild.   Aortic Valve: The aortic valve is tricuspid. Aortic valve regurgitation is  mild. Severe aortic stenosis is present. Aortic valve mean gradient  measures 41.5 mmHg. Aortic valve peak gradient measures 66.7 mmHg. Aortic  valve area, by VTI measures 1.04 cm.   Pulmonic Valve: The pulmonic valve was normal in structure. Pulmonic valve  regurgitation is not visualized.   Aorta: The aortic root is normal in size and structure. There is moderate  (Grade III) plaque involving the descending aorta.   IAS/Shunts: No atrial level shunt detected by color flow Doppler.   Additional Comments: Severe AS (mean gradient 42 mmHg and peak velocity of  4.2 m/s); mild AI.     LEFT VENTRICLE  PLAX 2D  LVOT diam:   2.20 cm  LV SV:     81  LV SV Index:  32  LVOT Area:   3.80 cm     AORTIC VALVE  AV Area (Vmax):  1.01 cm  AV Area  (Vmean):  0.92 cm  AV Area (VTI):   1.04 cm  AV Vmax:      408.50 cm/s  AV Vmean:     307.500 cm/s  AV VTI:      0.778 m  AV Peak Grad:   66.7 mmHg  AV Mean Grad:   41.5 mmHg  LVOT Vmax:     108.00 cm/s  LVOT Vmean:    74.800 cm/s  LVOT VTI:     0.214 m  LVOT/AV VTI ratio: 0.27   MITRAL VALVE  MV Peak grad: 5.8 mmHg SHUNTS  MV Mean grad: 2.0 mmHg Systemic VTI: 0.21 m  MV Vmax:   1.20 m/s Systemic Diam: 2.20 cm  MV Vmean:   60.7 cm/s   Kirk Ruths MD  Electronically signed by Kirk Ruths MD  Signature Date/Time: 08/21/2020/9:21:38 AM   Final   ____________________  Cardiac CT 08/19/20 Addenda  ADDENDUM REPORT: 08/20/2020 08:01  EXAM: OVER-READ INTERPRETATION  CT CHEST  The following report is an over-read performed by radiologist Dr. Rebekah Chesterfield [facility] on 08/20/2020. This over-read does not include interpretation of cardiac or coronary anatomy or pathology. The coronary calcium score and cardiac CTA interpretation by the cardiologist is attached.  COMPARISON:  Chest CTA 08/15/2020.  FINDINGS: Extracardiac findings will be described separately under dictation for contemporaneously obtained CTA chest, abdomen and pelvis.  IMPRESSION: Please see separate dictation for contemporaneously obtained CTA chest, abdomen and pelvis 08/19/2020 for full description of relevant extracardiac findings.   Electronically Signed   By: Vinnie Langton M.D.   On: 08/20/2020 08:01   Signed by Etheleen Mayhew, MD  on 08/20/2020 8:04 AM  Narrative & Impression  MEDICATIONS: MEDICATIONS None  EXAM: Cardiac TAVR CT  TECHNIQUE: The patient was scanned on a Siemens Force 097 slice scanner. A 120 kV retrospective scan was triggered in the descending thoracic aorta at 111 HU's. Gantry rotation speed was 270 msecs and collimation was .9 mm. No beta blockade or nitro were given. The 3D data  set was reconstructed in 5% intervals of the R-R cycle. Systolic and diastolic phases were analyzed on a dedicated work station using MPR, MIP and VRT modes. The patient received 80 cc of contrast.  FINDINGS: Aortic Valve: Calcium score 1645 Tri leaflet with restricted motion  Aorta: No aneurysm moderate calcific atherosclerosis normal arch vessels  Sino-tubular Junction: 25 mm  Ascending Thoracic Aorta: 33 mm  Aortic Arch: 25 mm  Descending Thoracic Aorta: 25 mm  Sinus of Valsalva Measurements:  Non-coronary: 30.8 mm  Right - coronary: 28.2 mm  Left - coronary: 31.3 mm  Coronary Artery Height above Annulus:  Left Main: 11.1 mm above annulus  Right Coronary: 15.3 mm above annulus  Virtual Basal Annulus Measurements:  Maximum/Minimum Diameter: 27.4 mm x 22.8 mm  Perimeter: 82 mm  Area: 505 mm2  Coronary Arteries: Sufficient height above annulus for deployment  Optimum Fluoroscopic Angle for Delivery: LAO 3 Caudal 6 degrees  IMPRESSION: 1. Tri-leaflet AV with calcium score 1645 and annular area of 505 mm2 suitable for a 26 mm Sapien 3 valve  2.  Coronary arteries sufficient height above annulus for deployment  3.  Optimum angiographic angle for deployment LAO 3 Caudal 6 degrees  4. Some nodular calcification in annulus at base of left coronary cusp  5.  Normal aortic root 3.3 cm  Jenkins Rouge    ____________________  CT chest/abd/pelvis 08/19/20 Final [99]  Study Result  Narrative & Impression  CLINICAL DATA:  64 year old male with history of severe aortic stenosis. Preprocedural study prior to potential transcatheter aortic valve replacement (TAVR) procedure.  EXAM: CT ANGIOGRAPHY CHEST, ABDOMEN AND PELVIS  TECHNIQUE: Non-contrast CT of the chest was initially obtained.  Multidetector CT imaging through the chest, abdomen and pelvis was performed using the standard protocol during bolus administration  of intravenous contrast. Multiplanar reconstructed images and MIPs were obtained and reviewed to evaluate the vascular anatomy.  CONTRAST:  172mL OMNIPAQUE IOHEXOL 350 MG/ML SOLN  COMPARISON:  None.  FINDINGS: CTA CHEST FINDINGS  Cardiovascular: Heart size is mildly enlarged. There is no significant pericardial fluid, thickening or pericardial calcification. There is aortic atherosclerosis, as well as atherosclerosis of the great vessels of the mediastinum and the coronary arteries, including calcified atherosclerotic plaque in the left anterior descending, left circumflex and right coronary arteries. Severe thickening calcification of the aortic valve. Mild calcifications of the mitral annulus.  Mediastinum/Nodes: No pathologically enlarged mediastinal or hilar lymph nodes. Esophagus is unremarkable in appearance. No axillary lymphadenopathy.  Lungs/Pleura: Patchy areas of ground-glass attenuation and mild interlobular septal thickening noted throughout the lungs bilaterally, favored to reflect a background of mild interstitial pulmonary edema. No confluent consolidative airspace disease. Moderate right and small left pleural effusions lying dependently with some associated passive subsegmental atelectasis in the lower lobes of the lungs bilaterally. Mild scarring in the medial aspect of the right upper lobe and inferior segment of the lingula.  Musculoskeletal: There are no aggressive appearing lytic or blastic lesions noted in the visualized portions of the skeleton.  CTA ABDOMEN AND PELVIS FINDINGS  Hepatobiliary: Diffuse low attenuation throughout the hepatic parenchyma, indicative  of a background of hepatic steatosis. In the inferior aspect of segment 5 of the liver (axial image 134 of series 4) there is a small hypovascular lesion measuring 1.3 cm in diameter (44 HU), which is indeterminate. No other larger more suspicious appearing hepatic lesions. No intra  or extrahepatic biliary ductal dilatation. Gallbladder is normal in appearance.  Pancreas: No pancreatic mass. No pancreatic ductal dilatation. No pancreatic or peripancreatic fluid collections or inflammatory changes.  Spleen: Unremarkable.  Adrenals/Urinary Tract: Subcentimeter low-attenuation lesions in the right kidney, too small to characterize, but statistically likely to represent cysts. Left kidney and bilateral adrenal glands are normal in appearance. No hydroureteronephrosis. Urinary bladder is normal in appearance.  Stomach/Bowel: The appearance of the stomach is normal. No pathologic dilatation of small bowel or colon. Normal appendix.  Vascular/Lymphatic: Aortic atherosclerosis, with vascular findings and measurements pertinent to potential TAVR procedure, as detailed below. No aneurysm or dissection noted in the abdominal or pelvic vasculature. No lymphadenopathy noted in the abdomen or pelvis.  Reproductive: Prostate gland and seminal vesicles are unremarkable in appearance.  Other: Moderate-sized umbilical hernia containing only omental fat. Left inguinal hernia containing predominantly fat. No significant volume of ascites. No pneumoperitoneum.  Musculoskeletal: There are no aggressive appearing lytic or blastic lesions noted in the visualized portions of the skeleton.  VASCULAR MEASUREMENTS PERTINENT TO TAVR:  AORTA:  Minimal Aortic Diameter-13 x 12 mm  Severity of Aortic Calcification-moderate to severe  RIGHT PELVIS:  Right Common Iliac Artery -  Minimal Diameter-7.3 x 7.8 mm  Tortuosity - mild  Calcification-moderate  Right External Iliac Artery -  Minimal Diameter-8.1 x 4.2 mm  Tortuosity - mild  Calcification-mild  Right Common Femoral Artery -  Minimal Diameter-7.9 x 7.6 mm  Tortuosity - mild  Calcification-mild  LEFT PELVIS:  Left Common Iliac Artery -  Minimal Diameter-7.4 x 7.8  mm  Tortuosity - mild  Calcification-moderate  Left External Iliac Artery -  Minimal Diameter-7.7 x 5.8 mm  Tortuosity - mild  Calcification-none  Left Common Femoral Artery -  Minimal Diameter-8.2 x 8.2 mm  Tortuosity-mild  Calcification-minimal  Review of the MIP images confirms the above findings.  IMPRESSION: 1. Vascular findings and measurements pertinent to potential TAVR procedure, as detailed above. 2. Severe thickening calcification of the aortic valve, compatible with the reported clinical history of severe aortic stenosis. 3. Aortic atherosclerosis, in addition to three vessel coronary artery disease. Please note that although the presence of coronary artery calcium documents the presence of coronary artery disease, the severity of this disease and any potential stenosis cannot be assessed on this non-gated CT examination. Assessment for potential risk factor modification, dietary therapy or pharmacologic therapy may be warranted, if clinically indicated. 4. Cardiomegaly with evidence of interstitial pulmonary edema and bilateral pleural effusions (right greater than left); imaging findings suggestive of congestive heart failure. 5. Hepatic steatosis. 6. 1.3 cm indeterminate hypovascular lesion in segment 5 of the liver. Correlation with nonemergent abdominal MRI with and without IV gadolinium is recommended in the near future to definitively characterize this lesion and exclude neoplasm. 7. Additional incidental findings, as above.   Electronically Signed   By: Vinnie Langton M.D.   On: 08/20/2020 08:53      Laboratory Data:  High Sensitivity Troponin:   Recent Labs  Lab 08/15/20 1955 08/15/20 2241 08/16/20 1459 08/16/20 1815 08/16/20 2105  TROPONINIHS 402* 330* 148* 164* 176*  175*     Chemistry Recent Labs  Lab 08/17/20 0351 08/17/20 0351 08/18/20 0320  08/18/20 0320 08/18/20 1327 08/18/20 1331 08/19/20 0815   NA 139   < > 139   < > 143 142 142  K 3.9   < > 4.2   < > 4.0 4.0 4.0  CL 99  --  100  --   --   --  99  CO2 28  --  30  --   --   --  33*  GLUCOSE 166*  --  158*  --   --   --  154*  BUN 23  --  23  --   --   --  20  CREATININE 1.43*  --  1.43*  --   --   --  1.36*  CALCIUM 8.9  --  8.7*  --   --   --  9.1  GFRNONAA 54*  --  54*  --   --   --  57*  ANIONGAP 12  --  9  --   --   --  10   < > = values in this interval not displayed.    Recent Labs  Lab 08/15/20 1801  PROT 7.7  ALBUMIN 3.7  AST 17  ALT 16  ALKPHOS 50  BILITOT 0.4   Hematology Recent Labs  Lab 08/17/20 0351 08/17/20 0351 08/18/20 0320 08/18/20 0320 08/18/20 1327 08/18/20 1331 08/19/20 0219  WBC 13.9*  --  12.3*  --   --   --  8.9  RBC 4.02*  --  3.94*  --   --   --  4.04*  HGB 10.1*   < > 9.8*   < > 11.2* 11.2* 9.9*  HCT 33.9*   < > 33.9*   < > 33.0* 33.0* 34.8*  MCV 84.3  --  86.0  --   --   --  86.1  MCH 25.1*  --  24.9*  --   --   --  24.5*  MCHC 29.8*  --  28.9*  --   --   --  28.4*  RDW 15.3  --  15.3  --   --   --  15.3  PLT 322  --  307  --   --   --  279   < > = values in this interval not displayed.   BNP Recent Labs  Lab 08/15/20 1801 08/16/20 2105  BNP 152.3* 246.2*    DDimer  Recent Labs  Lab 08/16/20 1459  DDIMER 1.02*     Radiology/Studies:  CT Angio Chest PE W and/or Wo Contrast  Result Date: 08/15/2020 CLINICAL DATA:  Shortness of breath EXAM: CT ANGIOGRAPHY CHEST WITH CONTRAST TECHNIQUE: Multidetector CT imaging of the chest was performed using the standard protocol during bolus administration of intravenous contrast. Multiplanar CT image reconstructions and MIPs were obtained to evaluate the vascular anatomy. CONTRAST:  164mL OMNIPAQUE IOHEXOL 350 MG/ML SOLN COMPARISON:  None. FINDINGS: Cardiovascular: No filling defects in the pulmonary arteries to suggest pulmonary emboli. Aortic atherosclerosis. Calcifications of the aortic valve. Heart is normal size.  Mediastinum/Nodes: Scattered small mediastinal lymph nodes, none pathologically enlarged. Trachea and esophagus are unremarkable. Thyroid unremarkable. Lungs/Pleura: Small bilateral pleural effusions. Compressive atelectasis in the lower lobes. Patchy bilateral ground-glass airspace opacities most notable in the upper lobes concerning for pneumonia. Upper Abdomen: Imaging into the upper abdomen demonstrates no acute findings. Musculoskeletal: Chest wall soft tissues are unremarkable. No acute bony abnormality. Review of the MIP images confirms the above findings. IMPRESSION: No evidence of pulmonary embolus. Small  bilateral pleural effusions. Compressive atelectasis in the lower lobes. Patchy bilateral ground-glass opacities in the upper lobes concerning for pneumonia. Aortic Atherosclerosis (ICD10-I70.0). Electronically Signed   By: Rolm Baptise M.D.   On: 08/15/2020 19:54   CARDIAC CATHETERIZATION  Result Date: 08/18/2020  Widely patent coronary arteries  High cardiac output: 9.06 L/min by Fick and 9.23 L/min by thermal dilution.  Etiology uncertain.  Suppressed TSH with normal T4 raising question of T3 thyrotoxicosis versus other mechanism.  Severe pulmonary hypertension, mean pressure 48 mmHg, based upon hemodynamics WHO group 2  Pulmonary capillary wedge pressure mean 34 mmHg, pulmonary vascular resistance 1.55 Woods units.  Mean right atrial pressure 19 mmHg  Aortic valve gradient peak to peak 33 mmHg with mean gradient 22 mmHg.  Aortic valve area 1.94 cm by Fick cardiac output and 1.98 cm by thermodilution  LV function normal by echocardiography with moderate LVH RECOMMENDATIONS:  Further work-up/management per treating team.  Needs further diuresis.  T3 level  DG Chest Port 1 View  Result Date: 08/15/2020 CLINICAL DATA:  Shortness of breath with exertion for 1 week. Dry cough. O2 sats 87%. EXAM: PORTABLE CHEST 1 VIEW COMPARISON:  06/08/2014 FINDINGS: Heart size is accentuated by  portable technique. There are faint opacities at the lung bases bilaterally, consistent with early infiltrates. No pulmonary edema. IMPRESSION: Suspect bibasilar infiltrates. Electronically Signed   By: Nolon Nations M.D.   On: 08/15/2020 17:44   ECHOCARDIOGRAM COMPLETE  Result Date: 08/17/2020    ECHOCARDIOGRAM REPORT   Patient Name:   DHAVAL WOO Date of Exam: 08/17/2020 Medical Rec #:  [id number]      Height:       71.0 in Accession #:    [id number]     Weight:       310.3 lb Date of Birth:  1956/10/07      BSA:          2.541 m Patient Age:    89 years       BP:           138/83 mmHg Patient Gender: M              HR:           97 bpm. Exam Location:  Inpatient Procedure: 2D Echo, Color Doppler and Cardiac Doppler Indications:    NSTEMI  History:        Patient has prior history of Echocardiogram examinations, most                 recent 08/10/2018. Risk Factors:Hypertension and Diabetes. Prior                 performed at [place]:    [place] Referring Phys: [id number] [place]  1. Left ventricular ejection fraction, by estimation, is 60 to 65%. The left ventricle has normal function. The left ventricle has no regional wall motion abnormalities. There is moderate concentric left ventricular hypertrophy. Left ventricular diastolic parameters are indeterminate.  2. Right ventricular systolic function is normal. The right ventricular size is normal.  3. Left atrial size was severely dilated.  4. The mitral valve is normal in structure. Trivial mitral valve regurgitation. The posterior leaflet appears to have limited mobility with mild flow acceleration noted across the mitral valve with mean gradient 70mmHg at HR 101. There is mild narrowing of the mitral valve orfice without significant stenosis.  5. The aortic valve is tricuspid. There is severe calcifcation of the  aortic valve. There is severe thickening of the aortic valve. Aortic valve regurgitation is not  visualized. Severe aortic valve stenosis with AVA 0.9 (by continuity), mean gradient 51mmHg, peak gradient 60mmHg, Vmax 4.65cm2, DOI 0.2.  6. The inferior vena cava is dilated in size with >50% respiratory variability, suggesting right atrial pressure of 8 mmHg. FINDINGS  Left Ventricle: Left ventricular ejection fraction, by estimation, is 60 to 65%. The left ventricle has normal function. The left ventricle has no regional wall motion abnormalities. The left ventricular internal cavity size was normal in size. There is  moderate concentric left ventricular hypertrophy. Left ventricular diastolic parameters are indeterminate. Right Ventricle: The right ventricular size is normal. Right vetricular wall thickness was not well visualized. Right ventricular systolic function is normal. Left Atrium: Left atrial size was severely dilated. Right Atrium: Right atrial size was normal in size. Pericardium: There is no evidence of pericardial effusion. Mitral Valve: The mitral valve is normal in structure. There is mild thickening of the mitral valve leaflet(s). Trivial mitral valve regurgitation. The posterior leaflet appears to have limited mobility with mild flow acceleration noted across the mitral  valve with mean gradient 55mmHg at HR 101. There is mild narrowing fo the mitral valve orfice without significant stenosis. MV peak gradient, 10.1 mmHg. The mean mitral valve gradient is 6.0 mmHg. Tricuspid Valve: The tricuspid valve is normal in structure. Tricuspid valve regurgitation is trivial. Aortic Valve: The aortic valve is tricuspid. There is severe calcifcation of the aortic valve. There is severe thickening of the aortic valve. Aortic valve regurgitation is not visualized. Severe aortic stenosis is present. Aortic valve mean gradient measures 51.0 mmHg. Aortic valve peak gradient measures 83.2 mmHg. Aortic valve area, by VTI measures 0.89 cm. DOI 0.2. Pulmonic Valve: The pulmonic valve was normal in structure.  Pulmonic valve regurgitation is trivial. Aorta: The aortic root and ascending aorta are structurally normal, with no evidence of dilitation. Venous: The inferior vena cava is dilated in size with greater than 50% respiratory variability, suggesting right atrial pressure of 8 mmHg. IAS/Shunts: No atrial level shunt detected by color flow Doppler.  LEFT VENTRICLE PLAX 2D LVIDd:         4.29 cm  Diastology LVIDs:         2.46 cm  LV e' medial:  8.05 cm/s LV PW:         1.61 cm  LV e' lateral: 10.40 cm/s LV IVS:        1.24 cm LVOT diam:     2.40 cm LV SV:         90 LV SV Index:   36 LVOT Area:     4.52 cm  RIGHT VENTRICLE RV S prime:     10.30 cm/s TAPSE (M-mode): 2.0 cm LEFT ATRIUM              Index       RIGHT ATRIUM           Index LA diam:        3.70 cm  1.46 cm/m  RA Area:     23.50 cm LA Vol (A2C):   108.0 ml 42.50 ml/m RA Volume:   75.60 ml  29.75 ml/m LA Vol (A4C):   123.0 ml 48.40 ml/m LA Biplane Vol: 120.0 ml 47.22 ml/m  AORTIC VALVE AV Area (Vmax):    0.97 cm AV Area (Vmean):   1.18 cm AV Area (VTI):     0.89 cm AV Vmax:  456.00 cm/s AV Vmean:          268.000 cm/s AV VTI:            1.017 m AV Peak Grad:      83.2 mmHg AV Mean Grad:      51.0 mmHg LVOT Vmax:         97.50 cm/s LVOT Vmean:        70.000 cm/s LVOT VTI:          0.200 m LVOT/AV VTI ratio: 0.20  AORTA Ao Root diam: 3.20 cm Ao Asc diam:  3.40 cm MITRAL VALVE MV Peak grad: 10.1 mmHg  SHUNTS MV Mean grad: 6.0 mmHg   Systemic VTI:  0.20 m MV Vmax:      1.59 m/s   Systemic Diam: 2.40 cm MV Vmean:     117.0 cm/s Gwyndolyn Kaufman MD Electronically signed by Gwyndolyn Kaufman MD Signature Date/Time: 08/17/2020/12:41:56 PM    Final    VAS US DOPPLER PRE CABG  Result Date: 08/19/2020 PREOPERATIVE VASCULAR EVALUATION  Indications:  Pre-CABG. Risk Factors: Hypertension, Diabetes. Performing Technologist: Griffin Basil RCT RDMS  Examination Guidelines: A complete evaluation includes B-mode imaging, spectral Doppler, color  Doppler, and power Doppler as needed of all accessible portions of each vessel. Bilateral testing is considered an integral part of a complete examination. Limited examinations for reoccurring indications may be performed as noted.  Right Carotid Findings: +----------+--------+--------+--------+--------+------------------+           PSV cm/sEDV cm/sStenosisDescribeComments           +----------+--------+--------+--------+--------+------------------+ CCA Prox  97      20                                         +----------+--------+--------+--------+--------+------------------+ CCA Distal104     27                      intimal thickening +----------+--------+--------+--------+--------+------------------+ ICA Prox  118     36      1-39%           intimal thickening +----------+--------+--------+--------+--------+------------------+ ICA Distal132     39                      intimal thickening +----------+--------+--------+--------+--------+------------------+ ECA       90      5                                          +----------+--------+--------+--------+--------+------------------+ Portions of this table do not appear on this page. +----------+--------+-------+--------+------------+           PSV cm/sEDV cmsDescribeArm Pressure +----------+--------+-------+--------+------------+ Subclavian188     4                           +----------+--------+-------+--------+------------+ +---------+--------+--+--------+--+---------+ VertebralPSV cm/s93EDV cm/s29Antegrade +---------+--------+--+--------+--+---------+ Left Carotid Findings: +----------+--------+--------+--------+--------+------------------+           PSV cm/sEDV cm/sStenosisDescribeComments           +----------+--------+--------+--------+--------+------------------+ CCA Prox  112     29                                          +----------+--------+--------+--------+--------+------------------+  CCA Distal105     23                      intimal thickening +----------+--------+--------+--------+--------+------------------+ ICA Prox  127     39                      intimal thickening +----------+--------+--------+--------+--------+------------------+ ICA Distal106     41                                         +----------+--------+--------+--------+--------+------------------+ ECA       102     13                                         +----------+--------+--------+--------+--------+------------------+ +----------+--------+--------+--------+------------+ SubclavianPSV cm/sEDV cm/sDescribeArm Pressure +----------+--------+--------+--------+------------+           212     7                            +----------+--------+--------+--------+------------+ +---------+--------+--+--------+--+---------+ VertebralPSV cm/s82EDV cm/s24Antegrade +---------+--------+--+--------+--+---------+  ABI Findings: +--------+------------------+-----+---------+--------+ Right   Rt Pressure (mmHg)IndexWaveform Comment  +--------+------------------+-----+---------+--------+ [id number]                    triphasic         +--------+------------------+-----+---------+--------+ +--------+------------------+-----+---------+-------+ Left    Lt Pressure (mmHg)IndexWaveform Comment +--------+------------------+-----+---------+-------+ Brachial120                    triphasic        +--------+------------------+-----+---------+-------+  Right Doppler Findings: +--------+--------+-----+---------+--------+ Site    PressureIndexDoppler  Comments +--------+--------+-----+---------+--------+ [id number]          triphasic         +--------+--------+-----+---------+--------+ Radial               triphasic         +--------+--------+-----+---------+--------+ Ulnar                 triphasic         +--------+--------+-----+---------+--------+  Left Doppler Findings: +--------+--------+-----+---------+--------+ Site    PressureIndexDoppler  Comments +--------+--------+-----+---------+--------+ [id number]          triphasic         +--------+--------+-----+---------+--------+ Radial               triphasic         +--------+--------+-----+---------+--------+ Ulnar                triphasic         +--------+--------+-----+---------+--------+  Summary: Right Carotid: Velocities in the right ICA are consistent with a 1-39% stenosis.                The ECA appears <50% stenosed. Left Carotid: Velocities in the left ICA are consistent with a 1-39% stenosis.               The ECA appears <50% stenosed. Vertebrals: Bilateral vertebral arteries demonstrate antegrade flow. Right Upper Extremity: Doppler waveforms remain within normal limits with right radial compression. Doppler waveforms remain within normal limits with right ulnar compression. Left Upper Extremity: Doppler waveforms decrease 50% with left radial compression. Doppler waveform obliterate with left ulnar compression.     Preliminary    VAS [place] LOWER EXTREMITY VENOUS (  DVT)  Result Date: 08/17/2020  Lower Venous DVTStudy Indications: Swelling.  Limitations: Body habitus and poor ultrasound/tissue interface. Comparison Study: No prior study Performing Technologist: Maudry Mayhew MHA, RDMS, RVT, RDCS  Examination Guidelines: A complete evaluation includes B-mode imaging, spectral Doppler, color Doppler, and power Doppler as needed of all accessible portions of each vessel. Bilateral testing is considered an integral part of a complete examination. Limited examinations for reoccurring indications may be performed as noted. The reflux portion of the exam is performed with the patient in reverse Trendelenburg.  +---------+---------------+---------+-----------+----------+--------------+ RIGHT     CompressibilityPhasicitySpontaneityPropertiesThrombus Aging +---------+---------------+---------+-----------+----------+--------------+ CFV      Full           Yes      Yes                                 +---------+---------------+---------+-----------+----------+--------------+ SFJ      Full                                                        +---------+---------------+---------+-----------+----------+--------------+ FV Prox  Full                                                        +---------+---------------+---------+-----------+----------+--------------+ FV Mid   Full                                                        +---------+---------------+---------+-----------+----------+--------------+ FV DistalFull                                                        +---------+---------------+---------+-----------+----------+--------------+ PFV      Full                                                        +---------+---------------+---------+-----------+----------+--------------+ POP      Full           Yes      Yes                                 +---------+---------------+---------+-----------+----------+--------------+ PTV      Full                                                        +---------+---------------+---------+-----------+----------+--------------+ PERO     Full                                                        +---------+---------------+---------+-----------+----------+--------------+   +---------+---------------+---------+-----------+----------+--------------+  LEFT     CompressibilityPhasicitySpontaneityPropertiesThrombus Aging +---------+---------------+---------+-----------+----------+--------------+ CFV      Full           Yes      Yes                                 +---------+---------------+---------+-----------+----------+--------------+ SFJ      Full                                                         +---------+---------------+---------+-----------+----------+--------------+ FV Prox  Full                                                        +---------+---------------+---------+-----------+----------+--------------+ FV Mid   Full                                                        +---------+---------------+---------+-----------+----------+--------------+ FV DistalFull                                                        +---------+---------------+---------+-----------+----------+--------------+ PFV      Full                                                        +---------+---------------+---------+-----------+----------+--------------+ POP      Full           Yes      Yes                                 +---------+---------------+---------+-----------+----------+--------------+ PTV      Full                                                        +---------+---------------+---------+-----------+----------+--------------+ PERO     Full                                                        +---------+---------------+---------+-----------+----------+--------------+     Summary: RIGHT: - There is no evidence of deep vein thrombosis in the lower extremity.  - No cystic structure found in the popliteal fossa.  LEFT: - There is no evidence of deep vein thrombosis in the lower extremity.  - No  cystic structure found in the popliteal fossa.  *See table(s) above for measurements and observations. Electronically signed by Fabienne Bruns MD on 08/17/2020 at 7:11:02 PM.    Final      STS Risk Calculator: Procedure: Isolated AVR Risk of Mortality: 1.770% Renal Failure: 6.918% Permanent Stroke: 0.815% Prolonged Ventilation: 15.565% DSW Infection: 0.249% Reoperation: 3.374% Morbidity or Mortality: 19.992% Short Length of Stay: 32.210% Long Length of Stay: 8.417%  ____________________  [facility] Cardiomyopathy Questionnaire   KCCQ-12 08/19/2020  1 a. Ability to shower/bathe Slightly limited  1 b. Ability to walk 1 block Extremely limited  1 c. Ability to hurry/jog Other, Did not do  2. Edema feet/ankles/legs 1-2 times a week  3. Limited by fatigue All of the time  4. Limited by dyspnea All of the time  5. Sitting up / on 3+ pillows Never over the past 2 weeks  6. Limited enjoyment of life Extremely limited  7. Rest of life w/ symptoms Not at all satisfied  8 a. Participation in hobbies Severely limited  8 b. Participation in chores Severely limited  8 c. Visiting family/friends Severely limited     Assessment and Plan:   Takuma Cifelli is a 64 y.o. male with symptoms of severe, stage D1 aortic stenosis with NYHA Class III symptoms, currently admitted with acute heart failure. I have reviewed the patient's recent echocardiogram which is notable for preserved LV systolic function and severe aortic stenosis with peak gradient of 83.2 mmHg and mean transvalvular gradient of 51 mmHg. The patient's dimensionless index is 0.2 and calculated aortic valve area is 0.97 cm. L/RHC showed widely patent coronary arteries, high cardiac output at 9.06 L/min by Fick and 9.23 L/min by thermal dilution, severe pulmonary hypertension with a mean pressure of 48 mmHg, pulmonary capillary wedge pressure mean 34 mmHg, pulmonary vascular resistance 1.55 Woods units, mean right atrial pressure 19 mmHg, aortic valve peak gradient of 33 mmHg, mean gradient 22 mmHg, aortic valve area 1.94 cm by Fick and 1.98 cm by thermal dilution, LVEDP 20 mmHg.    I have reviewed the natural history of aortic stenosis with the patient. We have discussed the limitations of medical therapy and the poor prognosis associated with symptomatic aortic stenosis. We have reviewed potential treatment options, including palliative medical therapy, conventional surgical aortic valve replacement, and transcatheter aortic valve replacement. We discussed treatment options  in the context of this patient's specific comorbid medical conditions.    The patient's predicted risk of mortality with conventional aortic valve replacement is 1.770% primarily based on HTN, anemia, morbid obesity, chronic back and knee pain with limited mobility, OSA on CPAP, CKD stage III, mitral stenosis. Other significant comorbid conditions include poor functional status with limited mobility and pulmonary HTN. His STS score is likely not reflective of his extremely poor functional status and he would likely be at least moderate to high risk for conventional surgery. TAVR seems like a reasonable treatment option for this patient, but that would not address his mitral valve, which Dr. Larey Days thinks may be a significant factor in his symptomatolgy. Follow up TEE today showed severe AS and no significant mitral stenosis. Cardiac gated CTA of the heart reveals anatomical characteristics consistent with aortic stenosis suitable for treatment by transcatheter aortic valve replacement without any significant complicating features except some nodular calcium in the annulus at the base of the LCC and CTA of the aorta and iliac vessels demonstrate what appear to be adequate pelvic vascular access to facilitate a transfemoral  approach. His creat has bumped a little after contrast studies and diuresis. I think this patient would would be a better TAVR candidate due to above comorbid conditions, but this will likely need to be delayed and done as an outpatient.   Dr. Cyndia Bent to follow.   For questions or updates, please contact [place] Please consult [email or web address] for contact info under    Signed, Angelena Form, PA-C  08/19/2020 12:43 PM   Chart reviewed, patient examined, agree with above. This 64 year old morbidly obese gentleman presents with New York Heart Association class III symptoms of fatigue and shortness of breath with minimal ambulation which he says just started about a week or so  prior to admission.  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  I have personally reviewed his 2D echocardiogram, TEE, cardiac catheterization, and CTA images.  His TEE today shows a calcified poorly mobile aortic valve with a mean gradient of 42 mmHg consistent with severe aortic stenosis.  There is trivial mitral regurgitation and mild mitral annular calcification.  The mean gradient across the mitral valve was 2 mmHg and peak gradient 5.8 mmHg.  Left ventricular systolic function is normal.  Cardiac catheterization shows no significant coronary disease.  There was severe pulmonary hypertension and elevated mean right atrial pressure of 19 mmHg.  He says that since admission his symptoms have significantly improved with diuresis.  I think aortic valve replacement is indicated in this patient for relief of his symptoms and prevent progressive left ventricular deterioration.  I think he would be a high risk patient for open surgical aortic valve replacement due to his morbid obesity, limited mobility, OSA, stage III chronic kidney disease, and other comorbid risk factors.  I think TAVR would be a reasonable alternative for him.  His gated cardiac CTA shows anatomy suitable for TAVR using a 26 mm Sapien 3 valve.  Given his BSA of 2.6 m and BMI of 42.3 kg/m it may be worthwhile evaluating him for a Medtronic Evolut valve to give him the lowest postoperative mean aortic valve gradient.  His abdominal and pelvic CTA shows adequate pelvic vascular anatomy to allow transfemoral insertion.  The patient was counseled at length regarding treatment alternatives for management of severe symptomatic aortic stenosis. The risks and benefits of surgical intervention has been discussed in detail. Long-term prognosis with medical therapy was discussed. Alternative  approaches such as conventional surgical aortic valve replacement, transcatheter aortic valve replacement, and palliative medical therapy were compared and contrasted at length. This discussion was placed in the context of the patient's own specific clinical presentation and past medical history. All of his questions have been addressed.   Following the decision to proceed with transcatheter aortic valve replacement, a discussion was held regarding what types of management strategies would be attempted intraoperatively in the event of life-threatening complications, including whether or not the patient would be considered a candidate for the use of cardiopulmonary bypass and/or conversion to open sternotomy for attempted surgical intervention. The patient is aware of the fact that transient use of cardiopulmonary bypass may be necessary.  I think he would be a candidate for emergent sternotomy if needed to manage any intraoperative complications.  The patient has been advised of a variety of complications that might develop including but not limited to risks of death,  stroke, paravalvular leak, aortic dissection or other major vascular complications, aortic annulus rupture, device embolization, cardiac rupture or perforation, mitral regurgitation, acute myocardial infarction, arrhythmia, heart block or bradycardia requiring permanent pacemaker placement, congestive heart failure, respiratory failure, renal failure, pneumonia, infection, other late complications related to structural valve deterioration or migration, or other complications that might ultimately cause a temporary or permanent loss of functional independence or other long term morbidity. The patient provides full informed consent for the procedure as described and all questions were answered.   He had a bump in his creatinine after cardiac catheterization and his CTA studies yesterday.  We will tentatively plan to do TAVR on 09/01/2020 which will  give his kidneys time to recover.  He could be discharged home and brought in for TAVR as an outpatient.   Gaye Pollack, MD TCTS [phone]

## 2020-08-18 NOTE — Plan of Care (Signed)
Post LHC and RHC:  Elevated Wedge (consistent with increased MV gradient) but with AV mean gradient only 22 mm Hg.  Widely patent CORS.  Will trial lasix and work to increase BB.  Lasix 40 mg BID started; will monitor for CIN.  Discussed with interventional and primary MD  Christell Constant, MD

## 2020-08-18 NOTE — Progress Notes (Signed)
ANTICOAGULATION CONSULT NOTE   Pharmacy Consult for heparin Indication: chest pain/ACS  Allergies  Allergen Reactions  . Cocoa Hives  . Sulfamethoxazole-Trimethoprim Anaphylaxis  . Chocolate Hives  . Citrus Hives    Reaction to oranges and orange juice  . Fish Allergy Hives and Swelling    Throat swelling  . Shellfish Allergy Hives and Swelling    Throat swelling  . Sulfa Antibiotics Hives and Swelling    Throat swelling  . Tetracycline Swelling    Patient Measurements: Height: 5\' 11"  (180.3 cm) Weight: (!) 140 kg (308 lb 10.3 oz) IBW/kg (Calculated) : 75.3 Heparin Dosing Weight: 109.4kg  Vital Signs: Temp: 99 F (37.2 C) (11/02 1115) Temp Source: Oral (11/02 1115) BP: 123/65 (11/02 1115) Pulse Rate: 88 (11/02 1115)  Labs: Recent Labs    08/16/20 1459 08/16/20 1459 08/16/20 1815 08/16/20 2105 08/17/20 0351 08/18/20 0320 08/18/20 1124  HGB 10.6*   < >  --   --  10.1* 9.8*  --   HCT 36.1*  --   --   --  33.9* 33.9*  --   PLT 340  --   --   --  322 307  --   HEPARINUNFRC 0.55   < >  --   --  0.40 0.78* 0.26*  CREATININE 1.41*  --   --   --  1.43* 1.43*  --   TROPONINIHS 148*  --  164* 176*  175*  --   --   --    < > = values in this interval not displayed.    Estimated Creatinine Clearance: 72.7 mL/min (A) (by C-G formula based on SCr of 1.43 mg/dL (H)).   Medical History: Past Medical History:  Diagnosis Date  . Apnea   . Chronic back pain   . Chronic knee pain   . Diabetes mellitus without complication (HCC)   . Hypertension    Assessment: 37 YOM presenting with SOB, elevated troponin, not on anticoagulation PTA.    Heparin level therapeutic this AM. Hgb remains stable. Plan for ECHO this AM. Cont IV heparin until further plan.   Goal of Therapy:  Heparin level 0.3-0.7 units/ml Monitor platelets by anticoagulation protocol: Yes   Plan:  Increase heparin gtt to 1300 units/hr F/u after cath Daily HL and CBC   69, PharmD, BCIDP,  AAHIVP, CPP Infectious Disease Pharmacist 08/18/2020 12:27 PM

## 2020-08-18 NOTE — Progress Notes (Signed)
Progress Note  Patient Name: Lawrence Rose Date of Encounter: 08/18/2020  Primary Cardiologist: Riley Lam MD  Subjective   Lawrence Rose is a 64 y.o. male with Diabetes with HTN, HLD, Morbid Obesity and OSA on CPAP, and CKD Stage III who presented for troponin elevation 08/16/20.  Found to have severe AS.  Patient notes that his shortness of breath is better but still persistent.  No chest pain.  Eager for [facility] to find out the next step.  Inpatient Medications    Scheduled Meds: . aspirin EC  81 mg Oral Daily  . chlorhexidine  15 mL Mouth Rinse BID  . cholecalciferol  1,000 Units Oral QHS  . cloNIDine  0.1 mg Oral BID  . diltiazem  240 mg Oral Daily  . folic acid  1 mg Oral Daily  . gabapentin  600 mg Oral BID  . insulin aspart  0-9 Units Subcutaneous TID WC  . latanoprost  1 drop Both Eyes QHS  . lisinopril  40 mg Oral QHS  . mouth rinse  15 mL Mouth Rinse q12n4p  . metoprolol tartrate  12.5 mg Oral BID  . multivitamin with minerals  1 tablet Oral QHS  . oxyCODONE  10 mg Oral Q12H  . pantoprazole  40 mg Oral BID  . rosuvastatin  20 mg Oral Daily  . sodium chloride flush  3 mL Intravenous Q12H   Continuous Infusions: . sodium chloride 140 mL/hr at 08/18/20 0555  . sodium chloride    . sodium chloride 1 mL/kg/hr (08/18/20 0529)  . heparin 1,250 Units/hr (08/18/20 0555)   PRN Meds: sodium chloride, sodium chloride, acetaminophen, HYDROcodone-acetaminophen, nitroGLYCERIN, ondansetron (ZOFRAN) IV, sodium chloride flush   Vital Signs    Vitals:   08/17/20 1615 08/17/20 1952 08/17/20 2305 08/18/20 0529  BP: 137/76 139/73  137/84  Pulse: 94 95 92 89  Resp: 18 20 16 16   Temp: 98.9 F (37.2 C) 98.6 F (37 C)  98.5 F (36.9 C)  TempSrc: Oral Oral  Oral  SpO2: 96% 98% 95% 97%  Weight:    (!) 140 kg  Height:        Intake/Output Summary (Last 24 hours) at 08/18/2020 1016 Last data filed at 08/18/2020 0855 Gross per 24 hour  Intake 1478.13 ml  Output  1895 ml  Net -416.87 ml   Filed Weights   08/16/20 1414 08/17/20 0500 08/18/20 0529  Weight: (!) 141.7 kg (!) 140.8 kg (!) 140 kg    Telemetry    Sinus rhythm to sinus tachycardia with occasional PVCs and PACs- Personally Reviewed  ECG    08/18/20 SR rate 90 with low voltage limb leads and occasional PACs - Personally Reviewed  Physical Exam   GEN: No acute distress.   Neck: Mild JVD to lower third of neck Cardiac: RRR, II/VI systolic murmur with no rubs, or gallops.  Respiratory: Clear to auscultation bilaterally. GI: Soft, nontender, non-distended  MS: +1 edema; No deformity. Neuro:  Nonfocal  Psych: Normal affect   Labs    Chemistry Recent Labs  Lab 08/15/20 1801 08/15/20 1801 08/16/20 1459 08/17/20 0351 08/18/20 0320  NA 140   < > 139 139 139  K 4.4   < > 4.2 3.9 4.2  CL 100   < > 95* 99 100  CO2 29   < > 29 28 30   GLUCOSE 120*   < > 209* 166* 158*  BUN 17   < > 22 23 23   CREATININE 1.38*   < >  1.41* 1.43* 1.43*  CALCIUM 9.2   < > 9.5 8.9 8.7*  PROT 7.7  --   --   --   --   ALBUMIN 3.7  --   --   --   --   AST 17  --   --   --   --   ALT 16  --   --   --   --   ALKPHOS 50  --   --   --   --   BILITOT 0.4  --   --   --   --   GFRNONAA 56*   < > 55* 54* 54*  ANIONGAP 11   < > 15 12 9    < > = values in this interval not displayed.     Hematology Recent Labs  Lab 08/16/20 1459 08/17/20 0351 08/18/20 0320  WBC 10.8* 13.9* 12.3*  RBC 4.31 4.02* 3.94*  HGB 10.6* 10.1* 9.8*  HCT 36.1* 33.9* 33.9*  MCV 83.8 84.3 86.0  MCH 24.6* 25.1* 24.9*  MCHC 29.4* 29.8* 28.9*  RDW 15.1 15.3 15.3  PLT 340 322 307   Cardiac Enzymes Novel Troponin:  164-> 175-> 176 08/16/20 2015.  BNP Recent Labs  Lab 08/15/20 1801 08/16/20 2105  BNP 152.3* 246.2*     DDimer  Recent Labs  Lab 08/16/20 1459  DDIMER 1.02*     Radiology    ECHOCARDIOGRAM COMPLETE  Result Date: 08/17/2020    ECHOCARDIOGRAM REPORT   [name] Date of Exam:  08/17/2020 Medical Rec #:  [id number]      Height:       71.0 in Accession #:    [id number]     Weight:       310.3 lb Date of Birth:  1955-11-24      BSA:          2.541 m Patient Age:    64 years       BP:           138/83 mmHg Patient Gender: M              HR:           97 bpm. Exam Location:  Inpatient Procedure: 2D Echo, Color Doppler and Cardiac Doppler Indications:    NSTEMI  History:        Patient has prior history of Echocardiogram examinations, most                 recent 08/10/2018. Risk Factors:Hypertension and Diabetes. Prior                 performed at [facility].  Sonographer:    [facility] Senior RDCS Referring Phys: Irving Burton SARA-MAIZ A THOMAS IMPRESSIONS  1. Left ventricular ejection fraction, by estimation, is 60 to 65%. The left ventricle has normal function. The left ventricle has no regional wall motion abnormalities. There is moderate concentric left ventricular hypertrophy. Left ventricular diastolic parameters are indeterminate.  2. Right ventricular systolic function is normal. The right ventricular size is normal.  3. Left atrial size was severely dilated.  4. The mitral valve is normal in structure. Trivial mitral valve regurgitation. The posterior leaflet appears to have limited mobility with mild flow acceleration noted across the mitral valve with mean gradient [id number] at HR 101. There is mild narrowing of the mitral valve orfice without significant stenosis.  5. The aortic valve is tricuspid. There is severe calcifcation of the aortic valve. There  is severe thickening of the aortic valve. Aortic valve regurgitation is not visualized. Severe aortic valve stenosis with AVA 0.9 (by continuity), mean gradient 51mmHg, peak gradient 82mmHg, Vmax 4.65cm2, DOI 0.2.  6. The inferior vena cava is dilated in size with >50% respiratory variability, suggesting right atrial pressure of 8 mmHg. FINDINGS  Left Ventricle: Left ventricular ejection fraction, by estimation, is 60 to 65%. The left ventricle has  normal function. The left ventricle has no regional wall motion abnormalities. The left ventricular internal cavity size was normal in size. There is  moderate concentric left ventricular hypertrophy. Left ventricular diastolic parameters are indeterminate. Right Ventricle: The right ventricular size is normal. Right vetricular wall thickness was not well visualized. Right ventricular systolic function is normal. Left Atrium: Left atrial size was severely dilated. Right Atrium: Right atrial size was normal in size. Pericardium: There is no evidence of pericardial effusion. Mitral Valve: The mitral valve is normal in structure. There is mild thickening of the mitral valve leaflet(s). Trivial mitral valve regurgitation. The posterior leaflet appears to have limited mobility with mild flow acceleration noted across the mitral  valve with mean gradient 6mmHg at HR 101. There is mild narrowing fo the mitral valve orfice without significant stenosis. MV peak gradient, 10.1 mmHg. The mean mitral valve gradient is 6.0 mmHg. Tricuspid Valve: The tricuspid valve is normal in structure. Tricuspid valve regurgitation is trivial. Aortic Valve: The aortic valve is tricuspid. There is severe calcifcation of the aortic valve. There is severe thickening of the aortic valve. Aortic valve regurgitation is not visualized. Severe aortic stenosis is present. Aortic valve mean gradient measures 51.0 mmHg. Aortic valve peak gradient measures 83.2 mmHg. Aortic valve area, by VTI measures 0.89 cm. DOI 0.2. Pulmonic Valve: The pulmonic valve was normal in structure. Pulmonic valve regurgitation is trivial. Aorta: The aortic root and ascending aorta are structurally normal, with no evidence of dilitation. Venous: The inferior vena cava is dilated in size with greater than 50% respiratory variability, suggesting right atrial pressure of 8 mmHg. IAS/Shunts: No atrial level shunt detected by color flow Doppler.  LEFT VENTRICLE PLAX 2D LVIDd:          4.29 cm  Diastology LVIDs:         2.46 cm  LV e' medial:  8.05 cm/s LV PW:         1.61 cm  LV e' lateral: 10.40 cm/s LV IVS:        1.24 cm LVOT diam:     2.40 cm LV SV:         90 LV SV Index:   36 LVOT Area:     4.52 cm  RIGHT VENTRICLE RV S prime:     10.30 cm/s TAPSE (M-mode): 2.0 cm LEFT ATRIUM              Index       RIGHT ATRIUM           Index LA diam:        3.70 cm  1.46 cm/m  RA Area:     23.50 cm LA Vol (A2C):   108.0 ml 42.50 ml/m RA Volume:   75.60 ml  29.75 ml/m LA Vol (A4C):   123.0 ml 48.40 ml/m LA Biplane Vol: 120.0 ml 47.22 ml/m  AORTIC VALVE AV Area (Vmax):    0.97 cm AV Area (Vmean):   1.18 cm AV Area (VTI):     0.89 cm AV Vmax:             456.00 cm/s AV Vmean:          268.000 cm/s AV VTI:            1.017 m AV Peak Grad:      83.2 mmHg AV Mean Grad:      51.0 mmHg LVOT Vmax:         97.50 cm/s LVOT Vmean:        70.000 cm/s LVOT VTI:          0.200 m LVOT/AV VTI ratio: 0.20  AORTA Ao Root diam: 3.20 cm Ao Asc diam:  3.40 cm MITRAL VALVE MV Peak grad: 10.1 mmHg  SHUNTS MV Mean grad: 6.0 mmHg   Systemic VTI:  0.20 m MV Vmax:      1.59 m/s   Systemic Diam: 2.40 cm MV Vmean:     117.0 cm/s Laurance Flatten MD Electronically signed by Laurance Flatten MD Signature Date/Time: 08/17/2020/12:41:56 PM    Final    VAS [place] LOWER EXTREMITY VENOUS (DVT)  Result Date: 08/17/2020  Lower Venous DVTStudy Indications: Swelling.  Limitations: Body habitus and poor ultrasound/tissue interface. Comparison Study: No prior study Performing Technologist: Gertie Fey MHA, RDMS, RVT, RDCS  Examination Guidelines: A complete evaluation includes B-mode imaging, spectral Doppler, color Doppler, and power Doppler as needed of all accessible portions of each vessel. Bilateral testing is considered an integral part of a complete examination. Limited examinations for reoccurring indications may be performed as noted. The reflux portion of the exam is performed with the patient in reverse  Trendelenburg.  +---------+---------------+---------+-----------+----------+--------------+ RIGHT    CompressibilityPhasicitySpontaneityPropertiesThrombus Aging +---------+---------------+---------+-----------+----------+--------------+ CFV      Full           Yes      Yes                                 +---------+---------------+---------+-----------+----------+--------------+ SFJ      Full                                                        +---------+---------------+---------+-----------+----------+--------------+ FV Prox  Full                                                        +---------+---------------+---------+-----------+----------+--------------+ FV Mid   Full                                                        +---------+---------------+---------+-----------+----------+--------------+ FV DistalFull                                                        +---------+---------------+---------+-----------+----------+--------------+ PFV      Full                                                        +---------+---------------+---------+-----------+----------+--------------+  POP      Full           Yes      Yes                                 +---------+---------------+---------+-----------+----------+--------------+ PTV      Full                                                        +---------+---------------+---------+-----------+----------+--------------+ PERO     Full                                                        +---------+---------------+---------+-----------+----------+--------------+   +---------+---------------+---------+-----------+----------+--------------+ LEFT     CompressibilityPhasicitySpontaneityPropertiesThrombus Aging +---------+---------------+---------+-----------+----------+--------------+ CFV      Full           Yes      Yes                                  +---------+---------------+---------+-----------+----------+--------------+ SFJ      Full                                                        +---------+---------------+---------+-----------+----------+--------------+ FV Prox  Full                                                        +---------+---------------+---------+-----------+----------+--------------+ FV Mid   Full                                                        +---------+---------------+---------+-----------+----------+--------------+ FV DistalFull                                                        +---------+---------------+---------+-----------+----------+--------------+ PFV      Full                                                        +---------+---------------+---------+-----------+----------+--------------+ POP      Full           Yes      Yes                                 +---------+---------------+---------+-----------+----------+--------------+  PTV      Full                                                        +---------+---------------+---------+-----------+----------+--------------+ PERO     Full                                                        +---------+---------------+---------+-----------+----------+--------------+     Summary: RIGHT: - There is no evidence of deep vein thrombosis in the lower extremity.  - No cystic structure found in the popliteal fossa.  LEFT: - There is no evidence of deep vein thrombosis in the lower extremity.  - No cystic structure found in the popliteal fossa.  *See table(s) above for measurements and observations. Electronically signed by Fabienne Brunsharles Fields MD on 08/17/2020 at 7:11:02 PM.    Final     Cardiac Studies   As above  Patient Profile     64 yo M with symptomatic severe AS and mitral valve gradients 6 mm HG  Assessment & Plan    Severe Symptomatic Aortic Stenosis NSTEMI Morbid Obesity HTN HLD CKD Stage III - -  with SOB and HF sx; elevated troponin and BNP - continue ASA and heparin - continue statin - continue ACEi - on schedule for LCP today - will call CT surgery and/or Heart Team based on results. - would like to start low level diuresis and beta blockage (12.5 mg metoprolol BID and 40 mg lasix IV daily) waiting until after LCP and contrast load  Discussed with patient and primary MD  For questions or updates, please contact [facility] Please consult [email or web address] for contact info under Cardiology/STEMI.      Signed, Christell ConstantMahesh A Perline Awe, MD  08/18/2020, 10:16 AM

## 2020-08-18 NOTE — Interval H&P Note (Signed)
History and Physical Interval Note:  08/18/2020 12:38 PM  Lawrence Rose  has presented today for surgery, with the diagnosis of unstable angina.  The various methods of treatment have been discussed with the patient and family. After consideration of risks, benefits and other options for treatment, the patient has consented to  Procedure(s): RIGHT/LEFT HEART CATH AND CORONARY ANGIOGRAPHY (N/A) as a surgical intervention.  The patient's history has been reviewed, patient examined, no change in status, stable for surgery.  I have reviewed the patient's chart and labs.  Questions were answered to the patient's satisfaction.     Lyn Records III

## 2020-08-18 NOTE — Progress Notes (Addendum)
PROGRESS NOTE    Lawrence Rose   VQQ:[id number]  DOB: 03/29/1956  DOA: 08/15/2020 PCP: Pcp, No   Brief Narrative:  Lawrence Rose is a 64 y.o. male with medical history significant of Obesity, HTN, HLD , GERD, DJD of knee, DMII, OSA on CPAP,who presents to  [facility] with complaint of progressive shortness of breath x 1 week. He states that with minimal activity such has ambulating to the bathroom he noted significant DOE with at time is associated with palpitations but note with chest pain.He notes that he recently has lasix cut in half earlier in the month due MD noting signs of dehydration. Patient states he has not noted increase swelling in lower extremities with this change.  In ED : temp 99.4, HR 102, 87% on room air with wheezing  Cr 1.38- last Cr 0.8 in 2015  Troponin: 350> 402 CTA in ED> Small bilateral pleural effusions. Compressive atelectasis in the lower lobes. Patchy bilateral ground-glass opacities in the upper lobes concerning for pneumonia.  Placed on Heparin infusion, Ceftriaxone and Azithromycin, given a dose of Solumedrol.  Subjective: No change in dyspnea from yesterday. No cough. No other new complaints.     Assessment & Plan:   Principal Problem:   Acute respiratory failure with hypoxia    Severe Aortic stenosis - ECHO 11/1> severe Aortic stenosis, severe LVH, left atrium severely dilated - suspect pulmonary edema and aortic stenosis as the cause - no cough, no leukocytosis on admission labs, fever is low grade- has never smoked- - stopped antibiotics and Prednisone on 11/1- following clinically - cardiology has been consulted- he will have a cath today and then cardiology feels CT Surgery may need to be consulted - he remains on O2. Addendum: mild AS on cath -severe pulm HTN- cardiology plans to continue diuretics  Active Problems:   NSTEMI (non-ST elevated myocardial infarction)   - Trop mas was 330 and had down trended since -on  Heparin infusion- will allow cardiology to manage this  Ref. Range 08/15/2020 22:41 08/16/2020 14:59 08/16/2020 18:15 08/16/2020 21:05 08/16/2020 21:05  Troponin I (High Sensitivity) Latest Ref Range: <18 ng/L 330 (HH) 148 (HH) 164 (HH) 175 (HH) 176 (HH)     CKD (chronic kidney disease) stage 3 - outpt Cr 1.58 05/20/20 - Cr 1.43 today and stable  Low TSH - sick euthyroid? - normal F T4 - check F T3 in AM and follow  Pedal edema, chronic - has resolved - has discoloration of skin on LE and thickening suggestive of chronic edema  DM2, controlled - hold Metformin- not on insulin at home - cont to follow sugars and cont low dose SSI - last A1c 5.5 on 05/20/20  Normocytic anemia/ likely AOCD  HTN, essential - cont Cardizem, Clonidine, Metoprolol and Lisinopril for now     OSA on CPAP - using CPAP At bedtime ordered  Chronic pain -cont Norco and Neurontin  - Oxycodone ED replaced with Oxycontin  Morbid obesity Body mass index is 43.05 kg/m.   HLD - cont Crestor   Time spent in minutes: 35 DVT prophylaxis: currently on heparin infusion  Code Status: Full code Family Communication:  Disposition Plan:  Status is: Inpatient  Remains inpatient appropriate because:hypoxia and DOE.   Dispo: The patient is from: Home              Anticipated d/c is to: Home              Anticipated d/c date is:  2 days              Patient currently is not medically stable to d/c.  Consultants:   Cardiology Procedures:    Antimicrobials:  Anti-infectives (From admission, onward)   Start     Dose/Rate Route Frequency Ordered Stop   08/16/20 2000  cefTRIAXone (ROCEPHIN) 2 g in sodium chloride 0.9 % 100 mL IVPB  Status:  Discontinued        2 g 200 mL/hr over 30 Minutes Intravenous Every 24 hours 08/16/20 1911 08/17/20 1336   08/16/20 2000  azithromycin (ZITHROMAX) 500 mg in sodium chloride 0.9 % 250 mL IVPB  Status:  Discontinued        500 mg 250 mL/hr over 60 Minutes Intravenous  Every 24 hours 08/16/20 1911 08/17/20 1336       Objective: Vitals:   08/17/20 1615 08/17/20 1952 08/17/20 2305 08/18/20 0529  BP: 137/76 139/73  137/84  Pulse: 94 95 92 89  Resp: Temp: 98.9 F (37.2 C) 98.6 F (37 C)  98.5 F (36.9 C)  TempSrc: Oral Oral  Oral  SpO2: 96% 98% 95% 97%  Weight:    (!) 140 kg  Height:        Intake/Output Summary (Last 24 hours) at 08/18/2020 1059 Last data filed at 08/18/2020 0855 Gross per 24 hour  Intake 1478.13 ml  Output 1675 ml  Net -196.87 ml   Filed Weights   08/16/20 1414 08/17/20 0500 08/18/20 0529  Weight: (!) 141.7 kg (!) 140.8 kg (!) 140 kg    Examination: General exam: Appears comfortable  HEENT: PERRLA, oral mucosa moist, no sclera icterus or thrush Respiratory system: Clear to auscultation. Respiratory effort normal. Cardiovascular system: S1 & S2 heard,  No murmurs - 2/6 murmur in LUS border radiating to carotids Gastrointestinal system: Abdomen soft, non-tender, nondistended. Normal bowel sounds   Central nervous system: Alert and oriented. No focal neurological deficits. Extremities: No cyanosis, clubbing or edema Skin: No rashes or ulcers Psychiatry:  Mood & affect appropriate.     Data Reviewed: I have personally reviewed following labs and imaging studies  CBC: Recent Labs  Lab 08/15/20 1801 08/16/20 1459 08/17/20 0351 08/18/20 0320  WBC 8.4 10.8* 13.9* 12.3*  NEUTROABS 6.2  --   --   --   HGB 10.1* 10.6* 10.1* 9.8*  HCT 34.0* 36.1* 33.9* 33.9*  MCV 84.4 83.8 84.3 86.0  PLT 306 340 322 307   Basic Metabolic Panel: Recent Labs  Lab 08/15/20 1801 08/16/20 1459 08/17/20 0351 08/18/20 0320  NA 140 139 139 139  K 4.4 4.2 3.9 4.2  CL 100 95* 99 100  CO2 GLUCOSE 120* 209* 166* 158*  BUN CREATININE 1.38* 1.41* 1.43* 1.43*  CALCIUM 9.2 9.5 8.9 8.7*  MG  --  2.0  --   --    GFR: Estimated Creatinine Clearance: 72.7 mL/min (A) (by C-G formula based on SCr of  1.43 mg/dL (H)). Liver Function Tests: Recent Labs  Lab 08/15/20 1801  AST 17  ALT 16  ALKPHOS 50  BILITOT 0.4  PROT 7.7  ALBUMIN 3.7   No results for input(s): LIPASE, AMYLASE in the last 168 hours. No results for input(s): AMMONIA in the last 168 hours. Coagulation Profile: No results for input(s): INR, PROTIME in the last 168 hours. Cardiac Enzymes: No results for input(s): CKTOTAL, CKMB, CKMBINDEX, TROPONINI in the last 168 hours. BNP (  last 3 results) No results for input(s): PROBNP in the last 8760 hours. HbA1C: Recent Labs    08/16/20 1459  HGBA1C 5.8*   CBG: Recent Labs  Lab 08/17/20 0628 08/17/20 1150 08/17/20 1616 08/17/20 2103 08/18/20 0524  GLUCAP 151* 199* 231* 197* 142*   Lipid Profile: Recent Labs    08/17/20 0351  CHOL 127  HDL 39*  LDLCALC 74  TRIG 69  CHOLHDL 3.3   Thyroid Function Tests: Recent Labs    08/16/20 1459 08/16/20 2105 08/17/20 0351  TSH 0.176*  --   --   FREET4  --    < > 0.77   < > = values in this interval not displayed.   Anemia Panel: Recent Labs    08/16/20 2105  TIBC 294  IRON 44*   Urine analysis: No results found for: COLORURINE, APPEARANCEUR, LABSPEC, PHURINE, GLUCOSEU, HGBUR, BILIRUBINUR, KETONESUR, PROTEINUR, UROBILINOGEN, NITRITE, LEUKOCYTESUR Sepsis Labs: @LABRCNTIP (procalcitonin:4,lacticidven:4) ) Recent Results (from the past 240 hour(s))  Respiratory Panel by RT PCR (Flu A&B, Covid) - Nasopharyngeal Swab     Status: None   Collection Time: 08/15/20  5:19 PM   Specimen: Nasopharyngeal Swab  Result Value Ref Range Status   SARS Coronavirus 2 by RT PCR NEGATIVE NEGATIVE Final    Comment: (NOTE) SARS-CoV-2 target nucleic acids are NOT DETECTED.  The SARS-CoV-2 RNA is generally detectable in upper respiratoy specimens during the acute phase of infection. The lowest concentration of SARS-CoV-2 viral copies this assay can detect is 131 copies/mL. A negative result does not preclude  SARS-Cov-2 infection and should not be used as the sole basis for treatment or other patient management decisions. A negative result may occur with  improper specimen collection/handling, submission of specimen other than nasopharyngeal swab, presence of viral mutation(s) within the areas targeted by this assay, and inadequate number of viral copies (<131 copies/mL). A negative result must be combined with clinical observations, patient history, and epidemiological information. The expected result is Negative.  Fact Sheet for Patients:  https://www.moore.com/  Fact Sheet for Healthcare Providers:  https://www.young.biz/  This test is no t yet approved or cleared by the [place] FDA and  has been authorized for detection and/or diagnosis of SARS-CoV-2 by FDA under an Emergency Use Authorization (EUA). This EUA will remain  in effect (meaning this test can be used) for the duration of the COVID-19 declaration under Section 564(b)(1) of the Act, 21 U.S.C. section 360bbb-3(b)(1), unless the authorization is terminated or revoked sooner.     Influenza A by PCR NEGATIVE NEGATIVE Final   Influenza B by PCR NEGATIVE NEGATIVE Final    Comment: (NOTE) The Xpert Xpress SARS-CoV-2/FLU/RSV assay is intended as an aid in  the diagnosis of influenza from Nasopharyngeal swab specimens and  should not be used as a sole basis for treatment. Nasal washings and  aspirates are unacceptable for Xpert Xpress SARS-CoV-2/FLU/RSV  testing.  Fact Sheet for Patients: https://www.moore.com/  Fact Sheet for Healthcare Providers: https://www.young.biz/  This test is not yet approved or cleared by the [place] FDA and  has been authorized for detection and/or diagnosis of SARS-CoV-2 by  FDA under an Emergency Use Authorization (EUA). This EUA will remain  in effect (meaning this test can be used) for the duration of the   Covid-19 declaration under Section 564(b)(1) of the Act, 21  U.S.C. section 360bbb-3(b)(1), unless the authorization is  terminated or revoked. Performed at [facility], [address], [place], [place] [address]  Culture, blood (routine x 2) Call MD if unable to obtain prior to antibiotics being given     Status: None (Preliminary result)   Collection Time: 08/16/20  9:04 PM   Specimen: BLOOD  Result Value Ref Range Status   Specimen Description BLOOD RIGHT ARM  Final   Special Requests   Final    BOTTLES DRAWN AEROBIC AND ANAEROBIC Blood Culture results may not be optimal due to an excessive volume of blood received in culture bottles   Culture   Final    NO GROWTH 2 DAYS Performed at [facility], [address], [place], [place] [address]    Report Status PENDING  Incomplete  Culture, blood (routine x 2) Call MD if unable to obtain prior to antibiotics being given     Status: None (Preliminary result)   Collection Time: 08/16/20  9:05 PM   Specimen: BLOOD RIGHT HAND  Result Value Ref Range Status   Specimen Description BLOOD RIGHT HAND  Final   Special Requests   Final    BOTTLES DRAWN AEROBIC ONLY Blood Culture adequate volume   Culture   Final    NO GROWTH 2 DAYS Performed at [facility], [address], [place], [place] [address]    Report Status PENDING  Incomplete         Radiology Studies: ECHOCARDIOGRAM COMPLETE  Result Date: 08/17/2020    ECHOCARDIOGRAM REPORT   [name] Date of Exam: 08/17/2020 Medical Rec #:  [id number]      Height:       71.0 in Accession #:    [id number]     Weight:       310.3 lb Date of Birth:  1956-02-23      BSA:          2.541 m Patient Age:    64 years       BP:           138/83 mmHg Patient Gender: M              HR:           97 bpm. Exam Location:  Inpatient Procedure: 2D Echo, Color Doppler and Cardiac Doppler Indications:    NSTEMI  History:        Patient has prior history of  Echocardiogram examinations, most                 recent 08/10/2018. Risk Factors:Hypertension and Diabetes. Prior                 performed at [facility].  Sonographer:    Irving Burton Senior RDCS Referring Phys: [id number] SARA-MAIZ A THOMAS IMPRESSIONS  1. Left ventricular ejection fraction, by estimation, is 60 to 65%. The left ventricle has normal function. The left ventricle has no regional wall motion abnormalities. There is moderate concentric left ventricular hypertrophy. Left ventricular diastolic parameters are indeterminate.  2. Right ventricular systolic function is normal. The right ventricular size is normal.  3. Left atrial size was severely dilated.  4. The mitral valve is normal in structure. Trivial mitral valve regurgitation. The posterior leaflet appears to have limited mobility with mild flow acceleration noted across the mitral valve with mean gradient at HR 101. There is mild narrowing of the mitral valve orfice without significant stenosis.  5. The aortic valve is tricuspid. There is severe calcifcation of the aortic valve. There is severe thickening of the aortic valve. Aortic valve  regurgitation is not visualized. Severe aortic valve stenosis with AVA 0.9 (by continuity), mean gradient , peak gradient , Vmax 4.65cm2, DOI 0.2.  6. The inferior vena cava is dilated in size with >50% respiratory variability, suggesting right atrial pressure of 8 mmHg. FINDINGS  Left Ventricle: Left ventricular ejection fraction, by estimation, is 60 to 65%. The left ventricle has normal function. The left ventricle has no regional wall motion abnormalities. The left ventricular internal cavity size was normal in size. There is  moderate concentric left ventricular hypertrophy. Left ventricular diastolic parameters are indeterminate. Right Ventricle: The right ventricular size is normal. Right vetricular wall thickness was not well visualized. Right ventricular systolic function is normal. Left Atrium:  Left atrial size was severely dilated. Right Atrium: Right atrial size was normal in size. Pericardium: There is no evidence of pericardial effusion. Mitral Valve: The mitral valve is normal in structure. There is mild thickening of the mitral valve leaflet(s). Trivial mitral valve regurgitation. The posterior leaflet appears to have limited mobility with mild flow acceleration noted across the mitral  valve with mean gradient at HR 101. There is mild narrowing fo the mitral valve orfice without significant stenosis. MV peak gradient, 10.1 mmHg. The mean mitral valve gradient is 6.0 mmHg. Tricuspid Valve: The tricuspid valve is normal in structure. Tricuspid valve regurgitation is trivial. Aortic Valve: The aortic valve is tricuspid. There is severe calcifcation of the aortic valve. There is severe thickening of the aortic valve. Aortic valve regurgitation is not visualized. Severe aortic stenosis is present. Aortic valve mean gradient measures 51.0 mmHg. Aortic valve peak gradient measures 83.2 mmHg. Aortic valve area, by VTI measures 0.89 cm. DOI 0.2. Pulmonic Valve: The pulmonic valve was normal in structure. Pulmonic valve regurgitation is trivial. Aorta: The aortic root and ascending aorta are structurally normal, with no evidence of dilitation. Venous: The inferior vena cava is dilated in size with greater than 50% respiratory variability, suggesting right atrial pressure of 8 mmHg. IAS/Shunts: No atrial level shunt detected by color flow Doppler.  LEFT VENTRICLE PLAX 2D LVIDd:         4.29 cm  Diastology LVIDs:         2.46 cm  LV e' medial:  8.05 cm/s LV PW:         1.61 cm  LV e' lateral: 10.40 cm/s LV IVS:        1.24 cm LVOT diam:     2.40 cm LV SV:         90 LV SV Index:   36 LVOT Area:     4.52 cm  RIGHT VENTRICLE RV S prime:     10.30 cm/s TAPSE (M-mode): 2.0 cm LEFT ATRIUM              Index       RIGHT ATRIUM           Index LA diam:        3.70 cm  1.46 cm/m  RA Area:     23.50 cm LA Vol  (A2C):   108.0 ml 42.50 ml/m RA Volume:   75.60 ml  29.75 ml/m LA Vol (A4C):   123.0 ml 48.40 ml/m LA Biplane Vol: 120.0 ml 47.22 ml/m  AORTIC VALVE AV Area (Vmax):    0.97 cm AV Area (Vmean):   1.18 cm AV Area (VTI):     0.89 cm AV Vmax:           456.00 cm/s AV Vmean:  268.000 cm/s AV VTI:            1.017 m AV Peak Grad:      83.2 mmHg AV Mean Grad:      51.0 mmHg LVOT Vmax:         97.50 cm/s LVOT Vmean:        70.000 cm/s LVOT VTI:          0.200 m LVOT/AV VTI ratio: 0.20  AORTA Ao Root diam: 3.20 cm Ao Asc diam:  3.40 cm MITRAL VALVE MV Peak grad: 10.1 mmHg  SHUNTS MV Mean grad: 6.0 mmHg   Systemic VTI:  0.20 m MV Vmax:      1.59 m/s   Systemic Diam: 2.40 cm MV Vmean:     117.0 cm/s Laurance FlattenHeather Pemberton MD Electronically signed by Laurance FlattenHeather Pemberton MD Signature Date/Time: 08/17/2020/12:41:56 PM    Final    VAS US LOWER EXTREMITY VENOUS (DVT)  Result Date: 08/17/2020  Lower Venous DVTStudy Indications: Swelling.  Limitations: Body habitus and poor ultrasound/tissue interface. Comparison Study: No prior study Performing Technologist: Gertie FeyMichelle Simonetti MHA, RDMS, RVT, RDCS  Examination Guidelines: A complete evaluation includes B-mode imaging, spectral Doppler, color Doppler, and power Doppler as needed of all accessible portions of each vessel. Bilateral testing is considered an integral part of a complete examination. Limited examinations for reoccurring indications may be performed as noted. The reflux portion of the exam is performed with the patient in reverse Trendelenburg.  +---------+---------------+---------+-----------+----------+--------------+ RIGHT    CompressibilityPhasicitySpontaneityPropertiesThrombus Aging +---------+---------------+---------+-----------+----------+--------------+ CFV      Full           Yes      Yes                                 +---------+---------------+---------+-----------+----------+--------------+ SFJ      Full                                                         +---------+---------------+---------+-----------+----------+--------------+ FV Prox  Full                                                        +---------+---------------+---------+-----------+----------+--------------+ FV Mid   Full                                                        +---------+---------------+---------+-----------+----------+--------------+ FV DistalFull                                                        +---------+---------------+---------+-----------+----------+--------------+ PFV      Full                                                        +---------+---------------+---------+-----------+----------+--------------+  POP      Full           Yes      Yes                                 +---------+---------------+---------+-----------+----------+--------------+ PTV      Full                                                        +---------+---------------+---------+-----------+----------+--------------+ PERO     Full                                                        +---------+---------------+---------+-----------+----------+--------------+   +---------+---------------+---------+-----------+----------+--------------+ LEFT     CompressibilityPhasicitySpontaneityPropertiesThrombus Aging +---------+---------------+---------+-----------+----------+--------------+ CFV      Full           Yes      Yes                                 +---------+---------------+---------+-----------+----------+--------------+ SFJ      Full                                                        +---------+---------------+---------+-----------+----------+--------------+ FV Prox  Full                                                        +---------+---------------+---------+-----------+----------+--------------+ FV Mid   Full                                                         +---------+---------------+---------+-----------+----------+--------------+ FV DistalFull                                                        +---------+---------------+---------+-----------+----------+--------------+ PFV      Full                                                        +---------+---------------+---------+-----------+----------+--------------+ POP      Full           Yes      Yes                                 +---------+---------------+---------+-----------+----------+--------------+  PTV      Full                                                        +---------+---------------+---------+-----------+----------+--------------+ PERO     Full                                                        +---------+---------------+---------+-----------+----------+--------------+     Summary: RIGHT: - There is no evidence of deep vein thrombosis in the lower extremity.  - No cystic structure found in the popliteal fossa.  LEFT: - There is no evidence of deep vein thrombosis in the lower extremity.  - No cystic structure found in the popliteal fossa.  *See table(s) above for measurements and observations. Electronically signed by Fabienne Bruns MD on 08/17/2020 at 7:11:02 PM.    Final       Scheduled Meds: . aspirin EC  81 mg Oral Daily  . chlorhexidine  15 mL Mouth Rinse BID  . cholecalciferol  1,000 Units Oral QHS  . cloNIDine  0.1 mg Oral BID  . diltiazem  240 mg Oral Daily  . folic acid  1 mg Oral Daily  . gabapentin  600 mg Oral BID  . insulin aspart  0-9 Units Subcutaneous TID WC  . latanoprost  1 drop Both Eyes QHS  . lisinopril  40 mg Oral QHS  . mouth rinse  15 mL Mouth Rinse q12n4p  . metoprolol tartrate  12.5 mg Oral BID  . multivitamin with minerals  1 tablet Oral QHS  . oxyCODONE  10 mg Oral Q12H  . pantoprazole  40 mg Oral BID  . rosuvastatin  20 mg Oral Daily  . sodium chloride flush  3 mL Intravenous Q12H   Continuous Infusions: .  sodium chloride 140 mL/hr at 08/18/20 0555  . sodium chloride    . sodium chloride 1 mL/kg/hr (08/18/20 0529)  . heparin 1,250 Units/hr (08/18/20 0555)     LOS: 2 days      Calvert Cantor, MD [facility] Pager: [email or web address] 08/18/2020, 10:59 AM

## 2020-08-19 ENCOUNTER — Inpatient Hospital Stay (HOSPITAL_COMMUNITY): Payer: Medicare HMO

## 2020-08-19 ENCOUNTER — Other Ambulatory Visit: Payer: Self-pay | Admitting: *Deleted

## 2020-08-19 DIAGNOSIS — I350 Nonrheumatic aortic (valve) stenosis: Secondary | ICD-10-CM

## 2020-08-19 DIAGNOSIS — M7989 Other specified soft tissue disorders: Secondary | ICD-10-CM | POA: Diagnosis not present

## 2020-08-19 DIAGNOSIS — I342 Nonrheumatic mitral (valve) stenosis: Secondary | ICD-10-CM | POA: Diagnosis not present

## 2020-08-19 DIAGNOSIS — Z01810 Encounter for preprocedural cardiovascular examination: Secondary | ICD-10-CM | POA: Diagnosis not present

## 2020-08-19 DIAGNOSIS — R0602 Shortness of breath: Secondary | ICD-10-CM

## 2020-08-19 DIAGNOSIS — N1831 Chronic kidney disease, stage 3a: Secondary | ICD-10-CM | POA: Diagnosis not present

## 2020-08-19 LAB — GLUCOSE, CAPILLARY
Glucose-Capillary: 125 mg/dL — ABNORMAL HIGH (ref 70–99)
Glucose-Capillary: 186 mg/dL — ABNORMAL HIGH (ref 70–99)
Glucose-Capillary: 126 mg/dL — ABNORMAL HIGH (ref 70–99)
Glucose-Capillary: 237 mg/dL — ABNORMAL HIGH (ref 70–99)

## 2020-08-19 LAB — BASIC METABOLIC PANEL
Anion gap: 10 (ref 5–15)
BUN: 20 mg/dL (ref 8–23)
CO2: 33 mmol/L — ABNORMAL HIGH (ref 22–32)
Calcium: 9.1 mg/dL (ref 8.9–10.3)
Chloride: 99 mmol/L (ref 98–111)
Creatinine, Ser: 1.36 mg/dL — ABNORMAL HIGH (ref 0.61–1.24)
GFR, Estimated: 57 mL/min — ABNORMAL LOW (ref >60)
Glucose, Bld: 154 mg/dL — ABNORMAL HIGH (ref 70–99)
Potassium: 4 mmol/L (ref 3.5–5.1)
Sodium: 142 mmol/L (ref 135–145)

## 2020-08-19 LAB — CBC
HCT: 34.8 % — ABNORMAL LOW (ref 39.0–52.0)
Hemoglobin: 9.9 g/dL — ABNORMAL LOW (ref 13.0–17.0)
MCH: 24.5 pg — ABNORMAL LOW (ref 26.0–34.0)
MCHC: 28.4 g/dL — ABNORMAL LOW (ref 30.0–36.0)
MCV: 86.1 fL (ref 80.0–100.0)
Platelets: 279 10*3/uL (ref 150–400)
RBC: 4.04 MIL/uL — ABNORMAL LOW (ref 4.22–5.81)
RDW: 15.3 % (ref 11.5–15.5)
WBC: 8.9 10*3/uL (ref 4.0–10.5)
nRBC: 0 % (ref 0.0–0.2)

## 2020-08-19 MED ORDER — METOPROLOL TARTRATE 5 MG/5ML IV SOLN
INTRAVENOUS | Status: AC
  Filled 2020-08-19: qty 5

## 2020-08-19 MED ORDER — METOPROLOL TARTRATE 25 MG PO TABS
25.0000 mg | ORAL_TABLET | Freq: Two times a day (BID) | ORAL | Status: DC
  Administered 2020-08-19 – 2020-08-23 (×9): 25 mg via ORAL
  Filled 2020-08-19 (×9): qty 1

## 2020-08-19 MED ORDER — IOHEXOL 350 MG/ML SOLN
100.0000 mL | Freq: Once | INTRAVENOUS | Status: AC | PRN
  Administered 2020-08-19: 100 mL via INTRAVENOUS

## 2020-08-19 MED ORDER — POLYETHYLENE GLYCOL 3350 17 G PO PACK
17.0000 g | PACK | Freq: Every day | ORAL | Status: AC
  Administered 2020-08-19 – 2020-08-20 (×2): 17 g via ORAL
  Filled 2020-08-19 (×2): qty 1

## 2020-08-19 NOTE — Progress Notes (Signed)
[facility] PROGRESS NOTE    Progress Note  Lawrence Rose  IRS:[id number] DOB: July 23, 1956 DOA: 08/15/2020 PCP: Pcp, No     Brief Narrative:   Lawrence Rose is an 64 y.o. male past medical history significant for obesity, essential hypertension diabetes mellitus type 2 presents to med [facility] with shortness of breath for 1 week.  As per patient his Lasix dose was reduced about a month earlier.  CT was concerning for pneumonia, he also had elevated troponins he was placed IV heparin Rocephin and azithromycin and transferred to [facility], the echo done on August 17, 2020 shows severe aortic stenosis with severe LVH  Assessment/Plan:   Acute respiratory failure with hypoxia (HCC) due to severe aortic stenosis leading to pulmonary edema: 2D echo on August 27, 2020 showed severe aortic stenosis. As he had no cough leukocytosis or fever antibiotics were discontinued along with prednisone. Cardiology was consulted recommended a cardiac cath that showed pulmonary hypertension with a mean pressure of 48 mmHg, capillary wedge pressure of 38 peak aortic valve gradient of 33 mmHg mean of 22. Cardiology recommended to start IV Lasix and increase beta-blockers. further management per cardiology. Good urine output he is negative about 3 L still appears fluid overloaded on physical exam.  Elevated troponins: Currently on aspirin, heparin, statin and ACE inhibitor.  Chronic kidney disease stage III: Creatinine seems to be at baseline basic metabolic panel is pending today.  Low TSH: We will need to check free T4 and TSH in 6 weeks question sick euthyroid syndrome.  Chronic lower extremity edema: Has resolved.  Controlled diabetes mellitus type 2: Metformin was discontinued not on insulin at home, his A1c is 5.5 we will need to monitor as an outpatient.  Normocytic anemia/anemia of chronic disease: Noted.  Essential hypertension: Blood pressure is fairly controlled  currently on Cardizem clonidine metoprolol and lisinopril for now.  Obstructive sleep apnea: CPAP at bedtime.  Chronic pain: Continue Norco and Neurontin.     CKD (chronic kidney disease) stage 3, GFR 30-59 ml/min (HCC)   OSA on CPAP    Obesity, Class III, BMI 40-49.9 (morbid obesity) (HCC)  Estimated body mass index is 43.17 kg/m as calculated from the following:   Height as of this encounter: 5\' 11"  (1.803 m).   Weight as of this encounter: 140.4 kg.   DVT prophylaxis: lovenox Family Communication:none Status is: Inpatient  Remains inpatient appropriate because:Hemodynamically unstable   Dispo: The patient is from: Home              Anticipated d/c is to: Home              Anticipated d/c date is: 2 days              Patient currently is not medically stable to d/c.        Code Status:     Code Status Orders  (From admission, onward)         Start     Ordered   08/16/20 1440  Full code  Continuous        08/16/20 1443        Code Status History    This patient has a current code status but no historical code status.   Advance Care Planning Activity        IV Access:    Peripheral IV   Procedures and diagnostic studies:   CARDIAC CATHETERIZATION  Result Date: 08/18/2020  Widely patent coronary arteries  High  cardiac output: 9.06 L/min by Fick and 9.23 L/min by thermal dilution.  Etiology uncertain.  Suppressed TSH with normal T4 raising question of T3 thyrotoxicosis versus other mechanism.  Severe pulmonary hypertension, mean pressure 48 mmHg, based upon hemodynamics WHO group 2  Pulmonary capillary wedge pressure mean 34 mmHg, pulmonary vascular resistance 1.55 Woods units.  Mean right atrial pressure 19 mmHg  Aortic valve gradient peak to peak 33 mmHg with mean gradient 22 mmHg.  Aortic valve area 1.94 cm by Fick cardiac output and 1.98 cm by thermodilution  LV function normal by echocardiography with moderate LVH RECOMMENDATIONS:   Further work-up/management per treating team.  Needs further diuresis.  T3 level  ECHOCARDIOGRAM COMPLETE  Result Date: 08/17/2020    ECHOCARDIOGRAM REPORT   [name] Date of Exam: 08/17/2020 Medical Rec #:  [id number]      Height:       71.0 in Accession #:    [id number]     Weight:       310.3 lb Date of Birth:  [birth year]      BSA:          2.541 m Patient Age:    64 years       BP:           138/83 mmHg Patient Gender: M              HR:           97 bpm. Exam Location:  Inpatient Procedure: 2D Echo, Color Doppler and Cardiac Doppler Indications:    NSTEMI  History:        Patient has prior history of Echocardiogram examinations, most                 recent 08/10/2018. Risk Factors:Hypertension and Diabetes. Prior                 performed at [facility].  Sonographer:    Irving Burton Senior RDCS Referring Phys: [id number] SARA-MAIZ A THOMAS IMPRESSIONS  1. Left ventricular ejection fraction, by estimation, is 60 to 65%. The left ventricle has normal function. The left ventricle has no regional wall motion abnormalities. There is moderate concentric left ventricular hypertrophy. Left ventricular diastolic parameters are indeterminate.  2. Right ventricular systolic function is normal. The right ventricular size is normal.  3. Left atrial size was severely dilated.  4. The mitral valve is normal in structure. Trivial mitral valve regurgitation. The posterior leaflet appears to have limited mobility with mild flow acceleration noted across the mitral valve with mean gradient at HR 101. There is mild narrowing of the mitral valve orfice without significant stenosis.  5. The aortic valve is tricuspid. There is severe calcifcation of the aortic valve. There is severe thickening of the aortic valve. Aortic valve regurgitation is not visualized. Severe aortic valve stenosis with AVA 0.9 (by continuity), mean gradient , peak gradient , Vmax 4.65cm2, DOI 0.2.  6. The inferior vena cava is  dilated in size with >50% respiratory variability, suggesting right atrial pressure of 8 mmHg. FINDINGS  Left Ventricle: Left ventricular ejection fraction, by estimation, is 60 to 65%. The left ventricle has normal function. The left ventricle has no regional wall motion abnormalities. The left ventricular internal cavity size was normal in size. There is  moderate concentric left ventricular hypertrophy. Left ventricular diastolic parameters are indeterminate. Right Ventricle: The right ventricular size is normal. Right vetricular wall thickness was not well visualized. Right ventricular systolic  function is normal. Left Atrium: Left atrial size was severely dilated. Right Atrium: Right atrial size was normal in size. Pericardium: There is no evidence of pericardial effusion. Mitral Valve: The mitral valve is normal in structure. There is mild thickening of the mitral valve leaflet(s). Trivial mitral valve regurgitation. The posterior leaflet appears to have limited mobility with mild flow acceleration noted across the mitral  valve with mean gradient at HR 101. There is mild narrowing fo the mitral valve orfice without significant stenosis. MV peak gradient, 10.1 mmHg. The mean mitral valve gradient is 6.0 mmHg. Tricuspid Valve: The tricuspid valve is normal in structure. Tricuspid valve regurgitation is trivial. Aortic Valve: The aortic valve is tricuspid. There is severe calcifcation of the aortic valve. There is severe thickening of the aortic valve. Aortic valve regurgitation is not visualized. Severe aortic stenosis is present. Aortic valve mean gradient measures 51.0 mmHg. Aortic valve peak gradient measures 83.2 mmHg. Aortic valve area, by VTI measures 0.89 cm. DOI 0.2. Pulmonic Valve: The pulmonic valve was normal in structure. Pulmonic valve regurgitation is trivial. Aorta: The aortic root and ascending aorta are structurally normal, with no evidence of dilitation. Venous: The inferior vena cava  is dilated in size with greater than 50% respiratory variability, suggesting right atrial pressure of 8 mmHg. IAS/Shunts: No atrial level shunt detected by color flow Doppler.  LEFT VENTRICLE PLAX 2D LVIDd:         4.29 cm  Diastology LVIDs:         2.46 cm  LV e' medial:  8.05 cm/s LV PW:         1.61 cm  LV e' lateral: 10.40 cm/s LV IVS:        1.24 cm LVOT diam:     2.40 cm LV SV:         90 LV SV Index:   36 LVOT Area:     4.52 cm  RIGHT VENTRICLE RV S prime:     10.30 cm/s TAPSE (M-mode): 2.0 cm LEFT ATRIUM              Index       RIGHT ATRIUM           Index LA diam:        3.70 cm  1.46 cm/m  RA Area:     23.50 cm LA Vol (A2C):   108.0 ml 42.50 ml/m RA Volume:   75.60 ml  29.75 ml/m LA Vol (A4C):   123.0 ml 48.40 ml/m LA Biplane Vol: 120.0 ml 47.22 ml/m  AORTIC VALVE AV Area (Vmax):    0.97 cm AV Area (Vmean):   1.18 cm AV Area (VTI):     0.89 cm AV Vmax:           456.00 cm/s AV Vmean:          268.000 cm/s AV VTI:            1.017 m AV Peak Grad:      83.2 mmHg AV Mean Grad:      51.0 mmHg LVOT Vmax:         97.50 cm/s LVOT Vmean:        70.000 cm/s LVOT VTI:          0.200 m LVOT/AV VTI ratio: 0.20  AORTA Ao Root diam: 3.20 cm Ao Asc diam:  3.40 cm MITRAL VALVE MV Peak grad: 10.1 mmHg  SHUNTS MV Mean grad: 6.0 mmHg   Systemic VTI:  0.20 m MV Vmax:      1.59 m/s   Systemic Diam: 2.40 cm MV Vmean:     117.0 cm/s Laurance Flatten MD Electronically signed by Laurance Flatten MD Signature Date/Time: 08/17/2020/12:41:56 PM    Final    VAS [place] LOWER EXTREMITY VENOUS (DVT)  Result Date: 08/17/2020  Lower Venous DVTStudy Indications: Swelling.  Limitations: Body habitus and poor ultrasound/tissue interface. Comparison Study: No prior study Performing Technologist: Gertie Fey MHA, RDMS, RVT, RDCS  Examination Guidelines: A complete evaluation includes B-mode imaging, spectral Doppler, color Doppler, and power Doppler as needed of all accessible portions of each vessel. Bilateral testing is  considered an integral part of a complete examination. Limited examinations for reoccurring indications may be performed as noted. The reflux portion of the exam is performed with the patient in reverse Trendelenburg.  +---------+---------------+---------+-----------+----------+--------------+ RIGHT    CompressibilityPhasicitySpontaneityPropertiesThrombus Aging +---------+---------------+---------+-----------+----------+--------------+ CFV      Full           Yes      Yes                                 +---------+---------------+---------+-----------+----------+--------------+ SFJ      Full                                                        +---------+---------------+---------+-----------+----------+--------------+ FV Prox  Full                                                        +---------+---------------+---------+-----------+----------+--------------+ FV Mid   Full                                                        +---------+---------------+---------+-----------+----------+--------------+ FV DistalFull                                                        +---------+---------------+---------+-----------+----------+--------------+ PFV      Full                                                        +---------+---------------+---------+-----------+----------+--------------+ POP      Full           Yes      Yes                                 +---------+---------------+---------+-----------+----------+--------------+ PTV      Full                                                        +---------+---------------+---------+-----------+----------+--------------+  PERO     Full                                                        +---------+---------------+---------+-----------+----------+--------------+   +---------+---------------+---------+-----------+----------+--------------+ LEFT      CompressibilityPhasicitySpontaneityPropertiesThrombus Aging +---------+---------------+---------+-----------+----------+--------------+ CFV      Full           Yes      Yes                                 +---------+---------------+---------+-----------+----------+--------------+ SFJ      Full                                                        +---------+---------------+---------+-----------+----------+--------------+ FV Prox  Full                                                        +---------+---------------+---------+-----------+----------+--------------+ FV Mid   Full                                                        +---------+---------------+---------+-----------+----------+--------------+ FV DistalFull                                                        +---------+---------------+---------+-----------+----------+--------------+ PFV      Full                                                        +---------+---------------+---------+-----------+----------+--------------+ POP      Full           Yes      Yes                                 +---------+---------------+---------+-----------+----------+--------------+ PTV      Full                                                        +---------+---------------+---------+-----------+----------+--------------+ PERO     Full                                                        +---------+---------------+---------+-----------+----------+--------------+       Summary: RIGHT: - There is no evidence of deep vein thrombosis in the lower extremity.  - No cystic structure found in the popliteal fossa.  LEFT: - There is no evidence of deep vein thrombosis in the lower extremity.  - No cystic structure found in the popliteal fossa.  *See table(s) above for measurements and observations. Electronically signed by Fabienne Bruns MD on 08/17/2020 at 7:11:02 PM.    Final      Medical  Consultants:    None.  Anti-Infectives:   none  Subjective:    Lawrence Rose he relates his breathing is significantly better.  Objective:    Vitals:   08/18/20 1445 08/18/20 1955 08/19/20 0047 08/19/20 0419  BP: 128/69 127/73 123/70 111/66  Pulse: 78 94 81 81  Resp:  20 20 20   Temp:  98.8 F (37.1 C)  98.4 F (36.9 C)  TempSrc:  Oral    SpO2:  97% 96% 99%  Weight:    (!) 140.4 kg  Height:       SpO2: 99 % O2 Flow Rate (L/min): 2 L/min   Intake/Output Summary (Last 24 hours) at 08/19/2020 0800 Last data filed at 08/19/2020 0700 Gross per 24 hour  Intake 2120.89 ml  Output 3850 ml  Net -1729.11 ml   Filed Weights   08/17/20 0500 08/18/20 0529 08/19/20 0419  Weight: (!) 140.8 kg (!) 140 kg (!) 140.4 kg    Exam: General exam: In no acute distress. Respiratory system: Good air movement and clear to auscultation. Cardiovascular system: S1 & S2 heard, RRR. +JVD Gastrointestinal system: Abdomen is nondistended, soft and nontender.  Central nervous system: Alert and oriented. No focal neurological deficits. Extremities: Trace edema Skin: No rashes, lesions or ulcers  Data Reviewed:    Labs: Basic Metabolic Panel: Recent Labs  Lab 08/15/20 1801 08/15/20 1801 08/16/20 1459 08/16/20 1459 08/17/20 0351 08/17/20 0351 08/18/20 0320 08/18/20 0320 08/18/20 1327 08/18/20 1331  NA 140   < > 139  --  139  --  139  --  143 142  K 4.4   < > 4.2   < > 3.9   < > 4.2   < > 4.0 4.0  CL 100  --  95*  --  99  --  100  --   --   --   CO2 29  --  29  --  28  --  30  --   --   --   GLUCOSE 120*  --  209*  --  166*  --  158*  --   --   --   BUN 17  --  22  --  23  --  23  --   --   --   CREATININE 1.38*  --  1.41*  --  1.43*  --  1.43*  --   --   --   CALCIUM 9.2  --  9.5  --  8.9  --  8.7*  --   --   --   MG  --   --  2.0  --   --   --   --   --   --   --    < > = values in this interval not displayed.   GFR Estimated Creatinine Clearance: 72.8 mL/min (A) (by C-G  formula based on SCr of 1.43 mg/dL (H)). Liver Function Tests: Recent Labs  Lab 08/15/20 1801  AST 17  ALT 16  ALKPHOS 50  BILITOT 0.4  PROT 7.7  ALBUMIN 3.7   No results for input(s): LIPASE, AMYLASE in the last 168 hours. No results for input(s): AMMONIA in the last 168 hours. Coagulation profile No results for input(s): INR, PROTIME in the last 168 hours. COVID-19 Labs  Recent Labs    08/16/20 1459  DDIMER 1.02*    Lab Results  Component Value Date   SARSCOV2NAA NEGATIVE 08/15/2020    CBC: Recent Labs  Lab 08/15/20 1801 08/15/20 1801 08/16/20 1459 08/16/20 1459 08/17/20 0351 08/18/20 0320 08/18/20 1327 08/18/20 1331 08/19/20 0219  WBC 8.4  --  10.8*  --  13.9* 12.3*  --   --  8.9  NEUTROABS 6.2  --   --   --   --   --   --   --   --   HGB 10.1*   < > 10.6*   < > 10.1* 9.8* 11.2* 11.2* 9.9*  HCT 34.0*   < > 36.1*   < > 33.9* 33.9* 33.0* 33.0* 34.8*  MCV 84.4  --  83.8  --  84.3 86.0  --   --  86.1  PLT 306  --  340  --  322 307  --   --  279   < > = values in this interval not displayed.   Cardiac Enzymes: No results for input(s): CKTOTAL, CKMB, CKMBINDEX, TROPONINI in the last 168 hours. BNP (last 3 results) No results for input(s): PROBNP in the last 8760 hours. CBG: Recent Labs  Lab 08/18/20 0524 08/18/20 1116 08/18/20 1612 08/18/20 2110 08/19/20 0611  GLUCAP 142* 148* 138* 214* 125*   D-Dimer: Recent Labs    08/16/20 1459  DDIMER 1.02*   Hgb A1c: Recent Labs    08/16/20 1459  HGBA1C 5.8*   Lipid Profile: Recent Labs    08/17/20 0351  CHOL 127  HDL 39*  LDLCALC 74  TRIG 69  CHOLHDL 3.3   Thyroid function studies: Recent Labs    08/16/20 1459  TSH 0.176*   Anemia work up: Recent Labs    08/16/20 2105  TIBC 294  IRON 44*   Sepsis Labs: Recent Labs  Lab 08/16/20 1459 08/17/20 0351 08/18/20 0320 08/19/20 0219  WBC 10.8* 13.9* 12.3* 8.9   Microbiology Recent Results (from the past 240 hour(s))  Respiratory  Panel by RT PCR (Flu A&B, Covid) - Nasopharyngeal Swab     Status: None   Collection Time: 08/15/20  5:19 PM   Specimen: Nasopharyngeal Swab  Result Value Ref Range Status   SARS Coronavirus 2 by RT PCR NEGATIVE NEGATIVE Final    Comment: (NOTE) SARS-CoV-2 target nucleic acids are NOT DETECTED.  The SARS-CoV-2 RNA is generally detectable in upper respiratoy specimens during the acute phase of infection. The lowest concentration of SARS-CoV-2 viral copies this assay can detect is 131 copies/mL. A negative result does not preclude SARS-Cov-2 infection and should not be used as the sole basis for treatment or other patient management decisions. A negative result may occur with  improper specimen collection/handling, submission of specimen other than nasopharyngeal swab, presence of viral mutation(s) within the areas targeted by this assay, and inadequate number of viral copies (<131 copies/mL). A negative result must be combined with clinical observations, patient history, and epidemiological information. The expected result is Negative.  Fact Sheet for Patients:  https://www.moore.com/  Fact Sheet for Healthcare Providers:  https://www.young.biz/  This test is no t yet approved or cleared by the [place] and  has been authorized for detection and/or diagnosis of SARS-CoV-2 by FDA under an Emergency Use Authorization (EUA). This EUA will remain  in effect (meaning this test can be used) for the duration of the COVID-19 declaration under Section 564(b)(1) of the Act, 21 U.S.C. section 360bbb-3(b)(1), unless the authorization is terminated or revoked sooner.     Influenza A by PCR NEGATIVE NEGATIVE Final   Influenza B by PCR NEGATIVE NEGATIVE Final    Comment: (NOTE) The Xpert Xpress SARS-CoV-2/FLU/RSV assay is intended as an aid in  the diagnosis of influenza from Nasopharyngeal swab specimens and  should not be used as a sole basis  for treatment. Nasal washings and  aspirates are unacceptable for Xpert Xpress SARS-CoV-2/FLU/RSV  testing.  Fact Sheet for Patients: https://www.moore.com/https://www.fda.gov/media/142436/download  Fact Sheet for Healthcare Providers: https://www.young.biz/https://www.fda.gov/media/142435/download  This test is not yet approved or cleared by the [place] FDA and  has been authorized for detection and/or diagnosis of SARS-CoV-2 by  FDA under an Emergency Use Authorization (EUA). This EUA will remain  in effect (meaning this test can be used) for the duration of the  Covid-19 declaration under Section 564(b)(1) of the Act, 21  U.S.C. section 360bbb-3(b)(1), unless the authorization is  terminated or revoked. Performed at [facility] [place] [facility], [address] [place], [place] [address]   Culture, blood (routine x 2) Call MD if unable to obtain prior to antibiotics being given     Status: None (Preliminary result)   Collection Time: 08/16/20  9:04 PM   Specimen: BLOOD  Result Value Ref Range Status   Specimen Description BLOOD RIGHT ARM  Final   Special Requests   Final    BOTTLES DRAWN AEROBIC AND ANAEROBIC Blood Culture results may not be optimal due to an excessive volume of blood received in culture bottles   Culture   Final    NO GROWTH 3 DAYS Performed at [facility], [address] [place], [place] [address]    Report Status PENDING  Incomplete  Culture, blood (routine x 2) Call MD if unable to obtain prior to antibiotics being given     Status: None (Preliminary result)   Collection Time: 08/16/20  9:05 PM   Specimen: BLOOD RIGHT HAND  Result Value Ref Range Status   Specimen Description BLOOD RIGHT HAND  Final   Special Requests   Final    BOTTLES DRAWN AEROBIC ONLY Blood Culture adequate volume   Culture   Final    NO GROWTH 3 DAYS Performed at [facility], [address] [place], [place] [address]    Report Status PENDING  Incomplete     Medications:   . aspirin EC  81 mg Oral  Daily  . chlorhexidine  15 mL Mouth Rinse BID  . cholecalciferol  1,000 Units Oral QHS  . cloNIDine  0.1 mg Oral BID  . diltiazem  240 mg Oral Daily  . folic acid  1 mg Oral Daily  . furosemide  40 mg Intravenous BID  . gabapentin  600 mg Oral BID  . heparin  5,000 Units Subcutaneous Q8H  . insulin aspart  0-9 Units Subcutaneous TID WC  . latanoprost  1 drop Both Eyes QHS  . lisinopril  40 mg Oral QHS  . mouth rinse  15 mL Mouth Rinse q12n4p  . metoprolol tartrate  12.5 mg Oral BID  . multivitamin with minerals  1 tablet Oral QHS  . oxyCODONE  10 mg Oral Q12H  .  pantoprazole  40 mg Oral BID  . pneumococcal 23 valent vaccine  0.5 mL Intramuscular Tomorrow-1000  . rosuvastatin  20 mg Oral Daily  . sodium chloride flush  3 mL Intravenous Q12H   Continuous Infusions: . sodium chloride 140 mL/hr at 08/18/20 0918  . sodium chloride        LOS: 3 days   Marinda Elk  [facility]  08/19/2020, 8:00 AM

## 2020-08-19 NOTE — Progress Notes (Addendum)
Progress Note  Patient Name: Lawrence Rose Date of Encounter: 08/19/2020  [facility] Cardiologist: Werner Lean, MD   Subjective   Pt diuresed well yesterday. CPAP in place. State breathing is better.  Inpatient Medications    Scheduled Meds: . aspirin EC  81 mg Oral Daily  . chlorhexidine  15 mL Mouth Rinse BID  . cholecalciferol  1,000 Units Oral QHS  . cloNIDine  0.1 mg Oral BID  . diltiazem  240 mg Oral Daily  . folic acid  1 mg Oral Daily  . furosemide  40 mg Intravenous BID  . gabapentin  600 mg Oral BID  . heparin  5,000 Units Subcutaneous Q8H  . insulin aspart  0-9 Units Subcutaneous TID WC  . latanoprost  1 drop Both Eyes QHS  . lisinopril  40 mg Oral QHS  . mouth rinse  15 mL Mouth Rinse q12n4p  . metoprolol tartrate  25 mg Oral BID  . multivitamin with minerals  1 tablet Oral QHS  . oxyCODONE  10 mg Oral Q12H  . pantoprazole  40 mg Oral BID  . pneumococcal 23 valent vaccine  0.5 mL Intramuscular Tomorrow-1000  . polyethylene glycol  17 g Oral Daily  . rosuvastatin  20 mg Oral Daily  . sodium chloride flush  3 mL Intravenous Q12H   Continuous Infusions: . sodium chloride 140 mL/hr at 08/18/20 0918  . sodium chloride     PRN Meds: sodium chloride, sodium chloride, acetaminophen, HYDROcodone-acetaminophen, nitroGLYCERIN, ondansetron (ZOFRAN) IV, oxyCODONE, sodium chloride flush   Vital Signs    Vitals:   08/18/20 1445 08/18/20 1955 08/19/20 0047 08/19/20 0419  BP: 128/69 127/73 123/70 111/66  Pulse: 78 94 81 81  Resp:  _0 Temp:  98.8 F (37.1 C)  98.4 F (36.9 C)  TempSrc:  Oral    SpO2:  97% 96% 99%  Weight:    (!) 140.4 kg  Height:        Intake/Output Summary (Last 24 hours) at 08/19/2020 0824 Last data filed at 08/19/2020 0700 Gross per 24 hour  Intake 2120.89 ml  Output 3850 ml  Net -1729.11 ml   Last 3 Weights 08/19/2020 08/18/2020 08/17/2020  Weight (lbs) 309 lb 8 oz 308 lb 10.3 oz 310 lb 4.8 oz  Weight (kg)  140.388 kg 140 kg 140.751 kg      Telemetry    Sinus rhythm HR in the 90s - Personally Reviewed  ECG    Sinus rhythm HR 93 with PAC - Personally Reviewed  Physical Exam   GEN: obese male in no acute distress, CPAP in place   Neck: ?JVP, CPAP in place Cardiac: RRR, 3/6 systolic murmur Respiratory: Clear to auscultation bilaterally. GI: Soft, nontender, non-distended  MS: No edema; No deformity. Neuro:  Nonfocal  Psych: Normal affect   Labs    High Sensitivity Troponin:   Recent Labs  Lab 08/15/20 1955 08/15/20 2241 08/16/20 1459 08/16/20 1815 08/16/20 2105  TROPONINIHS 402* 330* 148* 164* 176*  175*      Chemistry Recent Labs  Lab 08/15/20 1801 08/15/20 1801 08/16/20 1459 08/16/20 1459 08/17/20 0351 08/17/20 0351 08/18/20 0320 08/18/20 1327 08/18/20 1331  NA 140   < > 139   < > 139   < > 139 143 142  K 4.4   < > 4.2   < > 3.9   < > 4.2 4.0 4.0  CL 100   < > 95*  --  99  --  100  --   --   CO2 29   < > 29  --  28  --  30  --   --   GLUCOSE 120*   < > 209*  --  166*  --  158*  --   --   BUN 17   < > 22  --  23  --  23  --   --   CREATININE 1.38*   < > 1.41*  --  1.43*  --  1.43*  --   --   CALCIUM 9.2   < > 9.5  --  8.9  --  8.7*  --   --   PROT 7.7  --   --   --   --   --   --   --   --   ALBUMIN 3.7  --   --   --   --   --   --   --   --   AST 17  --   --   --   --   --   --   --   --   ALT 16  --   --   --   --   --   --   --   --   ALKPHOS 50  --   --   --   --   --   --   --   --   BILITOT 0.4  --   --   --   --   --   --   --   --   GFRNONAA 56*   < > 55*  --  54*  --  54*  --   --   ANIONGAP 11   < > 15  --  12  --  9  --   --    < > = values in this interval not displayed.     Hematology Recent Labs  Lab 08/17/20 0351 08/17/20 0351 08/18/20 0320 08/18/20 0320 08/18/20 1327 08/18/20 1331 08/19/20 0219  WBC 13.9*  --  12.3*  --   --   --  8.9  RBC 4.02*  --  3.94*  --   --   --  4.04*  HGB 10.1*   < > 9.8*   < > 11.2* 11.2* 9.9*  HCT  33.9*   < > 33.9*   < > 33.0* 33.0* 34.8*  MCV 84.3  --  86.0  --   --   --  86.1  MCH 25.1*  --  24.9*  --   --   --  24.5*  MCHC 29.8*  --  28.9*  --   --   --  28.4*  RDW 15.3  --  15.3  --   --   --  15.3  PLT 322  --  307  --   --   --  279   < > = values in this interval not displayed.    BNP Recent Labs  Lab 08/15/20 1801 08/16/20 2105  BNP 152.3* 246.2*     DDimer  Recent Labs  Lab 08/16/20 1459  DDIMER 1.02*     Radiology    CARDIAC CATHETERIZATION  Result Date: 08/18/2020  Widely patent coronary arteries  High cardiac output: 9.06 L/min by Fick and 9.23 L/min by thermal dilution.  Etiology uncertain.  Suppressed TSH with normal T4 raising question of T3 thyrotoxicosis versus other mechanism.  Severe pulmonary hypertension, mean pressure 48 mmHg, based upon hemodynamics WHO group 2  Pulmonary capillary wedge pressure mean 34 mmHg, pulmonary vascular resistance 1.55 Woods units.  Mean right atrial pressure 19 mmHg  Aortic valve gradient peak to peak 33 mmHg with mean gradient 22 mmHg.  Aortic valve area 1.94 cm by Fick cardiac output and 1.98 cm by thermodilution  LV function normal by echocardiography with moderate LVH RECOMMENDATIONS:  Further work-up/management per treating team.  Needs further diuresis.  T3 level  ECHOCARDIOGRAM COMPLETE  Result Date: 08/17/2020    ECHOCARDIOGRAM REPORT   [name] Date of Exam: 08/17/2020 Medical Rec #:  [id number]      Height:       71.0 in Accession #:    [id number]     Weight:       310.3 lb Date of Birth:  08/15/1956      BSA:          2.541 m Patient Age:    64 years       BP:           138/83 mmHg Patient Gender: M              HR:           97 bpm. Exam Location:  Inpatient Procedure: 2D Echo, Color Doppler and Cardiac Doppler Indications:    NSTEMI  History:        Patient has prior history of Echocardiogram examinations, most                 recent 08/10/2018. Risk Factors:Hypertension and  Diabetes. Prior                 performed at [place]:    [place] Referring Phys: [id number] [place]  1. Left ventricular ejection fraction, by estimation, is 60 to 65%. The left ventricle has normal function. The left ventricle has no regional wall motion abnormalities. There is moderate concentric left ventricular hypertrophy. Left ventricular diastolic parameters are indeterminate.  2. Right ventricular systolic function is normal. The right ventricular size is normal.  3. Left atrial size was severely dilated.  4. The mitral valve is normal in structure. Trivial mitral valve regurgitation. The posterior leaflet appears to have limited mobility with mild flow acceleration noted across the mitral valve with mean gradient 69mHg at HR 101. There is mild narrowing of the mitral valve orfice without significant stenosis.  5. The aortic valve is tricuspid. There is severe calcifcation of the aortic valve. There is severe thickening of the aortic valve. Aortic valve regurgitation is not visualized. Severe aortic valve stenosis with AVA 0.9 (by continuity), mean gradient 549mg, peak gradient 8240m, Vmax 4.65cm2, DOI 0.2.  6. The inferior vena cava is dilated in size with >50% respiratory variability, suggesting right atrial pressure of 8 mmHg. FINDINGS  Left Ventricle: Left ventricular ejection fraction, by estimation, is 60 to 65%. The left ventricle has normal function. The left ventricle has no regional wall motion abnormalities. The left ventricular internal cavity size was normal in size. There is  moderate concentric left ventricular hypertrophy. Left ventricular diastolic parameters are indeterminate. Right Ventricle: The right ventricular size is normal. Right vetricular wall thickness was not well visualized. Right ventricular systolic function is normal. Left Atrium: Left atrial size was severely dilated. Right Atrium: Right atrial size was normal in size.  Pericardium: There is no evidence of pericardial effusion. Mitral Valve:  The mitral valve is normal in structure. There is mild thickening of the mitral valve leaflet(s). Trivial mitral valve regurgitation. The posterior leaflet appears to have limited mobility with mild flow acceleration noted across the mitral  valve with mean gradient 76mHg at HR 101. There is mild narrowing fo the mitral valve orfice without significant stenosis. MV peak gradient, 10.1 mmHg. The mean mitral valve gradient is 6.0 mmHg. Tricuspid Valve: The tricuspid valve is normal in structure. Tricuspid valve regurgitation is trivial. Aortic Valve: The aortic valve is tricuspid. There is severe calcifcation of the aortic valve. There is severe thickening of the aortic valve. Aortic valve regurgitation is not visualized. Severe aortic stenosis is present. Aortic valve mean gradient measures 51.0 mmHg. Aortic valve peak gradient measures 83.2 mmHg. Aortic valve area, by VTI measures 0.89 cm. DOI 0.2. Pulmonic Valve: The pulmonic valve was normal in structure. Pulmonic valve regurgitation is trivial. Aorta: The aortic root and ascending aorta are structurally normal, with no evidence of dilitation. Venous: The inferior vena cava is dilated in size with greater than 50% respiratory variability, suggesting right atrial pressure of 8 mmHg. IAS/Shunts: No atrial level shunt detected by color flow Doppler.  LEFT VENTRICLE PLAX 2D LVIDd:         4.29 cm  Diastology LVIDs:         2.46 cm  LV e' medial:  8.05 cm/s LV PW:         1.61 cm  LV e' lateral: 10.40 cm/s LV IVS:        1.24 cm LVOT diam:     2.40 cm LV SV:         90 LV SV Index:   36 LVOT Area:     4.52 cm  RIGHT VENTRICLE RV S prime:     10.30 cm/s TAPSE (M-mode): 2.0 cm LEFT ATRIUM              Index       RIGHT ATRIUM           Index LA diam:        3.70 cm  1.46 cm/m  RA Area:     23.50 cm LA Vol (A2C):   108.0 ml 42.50 ml/m RA Volume:   75.60 ml  29.75 ml/m LA Vol (A4C):   123.0 ml  48.40 ml/m LA Biplane Vol: 120.0 ml 47.22 ml/m  AORTIC VALVE AV Area (Vmax):    0.97 cm AV Area (Vmean):   1.18 cm AV Area (VTI):     0.89 cm AV Vmax:           456.00 cm/s AV Vmean:          268.000 cm/s AV VTI:            1.017 m AV Peak Grad:      83.2 mmHg AV Mean Grad:      51.0 mmHg LVOT Vmax:         97.50 cm/s LVOT Vmean:        70.000 cm/s LVOT VTI:          0.200 m LVOT/AV VTI ratio: 0.20  AORTA Ao Root diam: 3.20 cm Ao Asc diam:  3.40 cm MITRAL VALVE MV Peak grad: 10.1 mmHg  SHUNTS MV Mean grad: 6.0 mmHg   Systemic VTI:  0.20 m MV Vmax:      1.59 m/s   Systemic Diam: 2.40 cm MV Vmean:     117.0 cm/s HGwyndolyn KaufmanMD Electronically  signed by Gwyndolyn Kaufman MD Signature Date/Time: 08/17/2020/12:41:56 PM    Final    VAS [place] LOWER EXTREMITY VENOUS (DVT)  Result Date: 08/17/2020  Lower Venous DVTStudy Indications: Swelling.  Limitations: Body habitus and poor ultrasound/tissue interface. Comparison Study: No prior study Performing Technologist: Maudry Mayhew MHA, RDMS, RVT, RDCS  Examination Guidelines: A complete evaluation includes B-mode imaging, spectral Doppler, color Doppler, and power Doppler as needed of all accessible portions of each vessel. Bilateral testing is considered an integral part of a complete examination. Limited examinations for reoccurring indications may be performed as noted. The reflux portion of the exam is performed with the patient in reverse Trendelenburg.  +---------+---------------+---------+-----------+----------+--------------+ RIGHT    CompressibilityPhasicitySpontaneityPropertiesThrombus Aging +---------+---------------+---------+-----------+----------+--------------+ CFV      Full           Yes      Yes                                 +---------+---------------+---------+-----------+----------+--------------+ SFJ      Full                                                         +---------+---------------+---------+-----------+----------+--------------+ FV Prox  Full                                                        +---------+---------------+---------+-----------+----------+--------------+ FV Mid   Full                                                        +---------+---------------+---------+-----------+----------+--------------+ FV DistalFull                                                        +---------+---------------+---------+-----------+----------+--------------+ PFV      Full                                                        +---------+---------------+---------+-----------+----------+--------------+ POP      Full           Yes      Yes                                 +---------+---------------+---------+-----------+----------+--------------+ PTV      Full                                                        +---------+---------------+---------+-----------+----------+--------------+  PERO     Full                                                        +---------+---------------+---------+-----------+----------+--------------+   +---------+---------------+---------+-----------+----------+--------------+ LEFT     CompressibilityPhasicitySpontaneityPropertiesThrombus Aging +---------+---------------+---------+-----------+----------+--------------+ CFV      Full           Yes      Yes                                 +---------+---------------+---------+-----------+----------+--------------+ SFJ      Full                                                        +---------+---------------+---------+-----------+----------+--------------+ FV Prox  Full                                                        +---------+---------------+---------+-----------+----------+--------------+ FV Mid   Full                                                         +---------+---------------+---------+-----------+----------+--------------+ FV DistalFull                                                        +---------+---------------+---------+-----------+----------+--------------+ PFV      Full                                                        +---------+---------------+---------+-----------+----------+--------------+ POP      Full           Yes      Yes                                 +---------+---------------+---------+-----------+----------+--------------+ PTV      Full                                                        +---------+---------------+---------+-----------+----------+--------------+ PERO     Full                                                        +---------+---------------+---------+-----------+----------+--------------+  Summary: RIGHT: - There is no evidence of deep vein thrombosis in the lower extremity.  - No cystic structure found in the popliteal fossa.  LEFT: - There is no evidence of deep vein thrombosis in the lower extremity.  - No cystic structure found in the popliteal fossa.  *See table(s) above for measurements and observations. Electronically signed by Ruta Hinds MD on 08/17/2020 at 7:11:02 PM.    Final     Cardiac Studies   Right and left heart cath 08/18/20:  Widely patent coronary arteries  High cardiac output: 9.06 L/min by Fick and 9.23 L/min by thermal dilution.  Etiology uncertain.  Suppressed TSH with normal T4 raising question of T3 thyrotoxicosis versus other mechanism.  Severe pulmonary hypertension, mean pressure 48 mmHg, based upon hemodynamics WHO group 2  Pulmonary capillary wedge pressure mean 34 mmHg, pulmonary vascular resistance 1.55 Woods units.  Mean right atrial pressure 19 mmHg  Aortic valve gradient peak to peak 33 mmHg with mean gradient 22 mmHg.  Aortic valve area 1.94 cm by Fick cardiac output and 1.98 cm by thermodilution  LV function normal  by echocardiography with moderate LVH   RECOMMENDATIONS:    Further work-up/management per treating team.  Needs further diuresis.  T3 level    Echo 08/17/20:   1. Left ventricular ejection fraction, by estimation, is 60 to 65%. The  left ventricle has normal function. The left ventricle has no regional  wall motion abnormalities. There is moderate concentric left ventricular  hypertrophy. Left ventricular  diastolic parameters are indeterminate.   2. Right ventricular systolic function is normal. The right ventricular  size is normal.   3. Left atrial size was severely dilated.   4. The mitral valve is normal in structure. Trivial mitral valve  regurgitation. The posterior leaflet appears to have limited mobility with  mild flow acceleration noted across the mitral valve with mean gradient  13mHg at HR 101. There is mild narrowing  of the mitral valve orfice without significant stenosis.   5. The aortic valve is tricuspid. There is severe calcifcation of the  aortic valve. There is severe thickening of the aortic valve. Aortic valve  regurgitation is not visualized. Severe aortic valve stenosis with AVA 0.9  (by continuity), mean gradient  550mg, peak gradient 8229m, Vmax 4.65cm2, DOI 0.2.   6. The inferior vena cava is dilated in size with >50% respiratory  variability, suggesting right atrial pressure of 8 mmHg.   Patient Profile     64 105o. male with Diabetes with HTN, HLD, Morbid Obesity and OSA on CPAP, and CKD Stage III who presented for troponin elevation 08/16/20.  Found to have severe AS.  Assessment & Plan    Aortic stenosis - severe by echo Mitral stenosis with Pulmonary hypertension - aortic valve mean gradient was 22 mmHg, increased mitral valve gradient - started 40 mg PO lasix BID yesterday, increased BB to 25 mg lopressor BID - structural heart team will see 08/19/20 - with Mean gradient of 6 mmHg at 101 heart rate (cath), Wedge of 34 mmHg, and severe  left atrial enlargement - significant MAC with mild leaflet thickening - started lasix yesterday - overall net negative 1.7 L with 3.8 cc urine output yesterday - weight is 309 lbs from ?320lbs on admission  Nonobstructive CAD - heart cath yesterday with only mild disease via radial artery  - hs troponin 350 --> 402 --> 330 --> 148 --> 164 --> 175 --> 176 - pt tolerated the procedure  well - no chest pain - cath site C/D/I - continue ASA and statin  Acute on chronic anemia - post-op Hb has dropped to 9.9 - appears baseline is 10-11 - no signs of bleeding - repeat CBC this afternoon - ?dilutional  Hypertension - continue clonidine 0.1 mg BID, cardizem 240 mg daily, lisinopril 40 mg  Hyperthyroidism  - TSH 0.176 - free T4 WNL - T3 pending  CKD stage III - Improvement in creatinine  OSA on CPAP - compliant     For questions or updates, please contact [facility] Please consult [email or web address] for contact info under      Signed, Ledora Bottcher, PA  08/19/2020, 8:24 AM    Personally seen and examined. Agree with APP Provider above with the following comments:  65 yo M with morbid obesity, Significant tri-leaflet Aortic stenosis with discrepancy between cath and echo data (possible pressure recovery) an at least Stage B progressive mitral stenosis (non rheumatic) with MAC and mild leaflet thickening. No evidence of CAD.  Patient is diuresing well and we are increasing his heart rate control.  Kidney function improving from 08/18/20. Personally reviewed his last echocardiogram:  Severe LA dilation (LAV index over 40) with MAC and thickended valve; severely calcified aortic valve).  Discussed risks and benefits of valvular interventions; seeing structural heart team today.  Once euvolemic; will consider repeat limited echo if still discrepancies in data for surgical planning.  Werner Lean, MD

## 2020-08-19 NOTE — Progress Notes (Signed)
Pre CABG study completed.   See CVProc for preliminary results.   Sylvester Minton, RDMS, RVT 

## 2020-08-19 NOTE — Evaluation (Signed)
Physical Therapy Evaluation Patient Details Name: Lawrence Rose MRN: [id number] DOB: Oct 24, 1955 Today's Date: 08/19/2020   History of Present Illness  Lawrence Rose is an 64 y.o. male past medical history significant for obesity, essential hypertension, diabetes mellitus type 2, lumbar surgery,,  presents to med [facility] with shortness of breath for 1 week.  As per patient his Lasix dose was reduced about a month earlier.  CT was concerning for pneumonia, he also had elevated troponins.  He was placed IV heparin Rocephin and azithromycin and transferred to [facility].  The echo done on August 17, 2020 shows severe aortic stenosis with severe LVH.  Cardiac Cath completed 11/2, findings including pulm HTN and edema.  Clinical Impression  Pt admitted with/for SOB, see findings above.  Pt generally supervision/mod I at this time with in the room--home like environmental mobility.  Pt currently limited functionally due to the problems listed below.  (see problems list.)  Pt will benefit from PT to maximize function and safety to be able to get home safely with available assist.     Follow Up Recommendations No PT follow up;Supervision - Intermittent    Equipment Recommendations  3in1 (PT);Other (comment) (or riser)    Recommendations for Other Services       Precautions / Restrictions Precautions Precautions: Fall      Mobility  Bed Mobility Overal bed mobility: Modified Independent Bed Mobility: Supine to Sit;Sit to Supine     Supine to sit: Modified independent (Device/Increase time) Sit to supine: Modified independent (Device/Increase time)   General bed mobility comments: slow, but safe    Transfers Overall transfer level: Modified independent               General transfer comment: appropriate use of UE's, safe  Ambulation/Gait Ambulation/Gait assistance: Supervision Gait Distance (Feet): 130 Feet Assistive device: Rolling walker (2 wheeled) Gait  Pattern/deviations: Step-through pattern Gait velocity: slower Gait velocity interpretation: <1.8 ft/sec, indicate of risk for recurrent falls General Gait Details: wide BOS, slow, moderate step lengths.   Moderate use of the RW.  On 1L O2, pt's sats maintained at 91/92% with HR 100 bpm,  At rest, sats 87% at 88 bpm.  Stairs            Wheelchair Mobility    Modified Rankin (Stroke Patients Only)       Balance Overall balance assessment: Needs assistance   Sitting balance-Leahy Scale: Good       Standing balance-Leahy Scale: Fair Standing balance comment: based on standing at sink completing basic clean up without use of AD                             Pertinent Vitals/Pain Pain Assessment: Faces Faces Pain Scale: No hurt Pain Intervention(s): Monitored during session    Home Living Family/patient expects to be discharged to:: Private residence Living Arrangements: Alone Available Help at Discharge: Family;Available PRN/intermittently Type of Home: House Home Access: Stairs to enter     Home Layout: Two level;Able to live on main level with bedroom/bathroom Home Equipment: Walker - 4 wheels;Cane - single point      Prior Function Level of Independence: Independent with assistive device(s)         Comments: brother, friends help with groceries and out of home iADLS.  pt can drive to appointments.     Hand Dominance        Extremity/Trunk Assessment   Upper Extremity  Assessment Upper Extremity Assessment: Overall WFL for tasks assessed    Lower Extremity Assessment Lower Extremity Assessment: Overall WFL for tasks assessed       Communication   Communication: No difficulties  Cognition Arousal/Alertness: Awake/alert Behavior During Therapy: WFL for tasks assessed/performed Overall Cognitive Status: Within Functional Limits for tasks assessed (NT formally)                                        General Comments  General comments (skin integrity, edema, etc.): sats at 87% on RA on arrival    Exercises     Assessment/Plan    PT Assessment Patient needs continued PT services  PT Problem List Decreased activity tolerance;Decreased balance;Decreased mobility;Cardiopulmonary status limiting activity       PT Treatment Interventions DME instruction;Gait training;Stair training;Functional mobility training;Therapeutic activities;Patient/family education;Balance training    PT Goals (Rose goals can be found in the Care Plan section)  Acute Rehab PT Goals Patient Stated Goal: home soon PT Goal Formulation: With patient Time For Goal Achievement: 08/26/20 Potential to Achieve Goals: Good    Frequency Min 3X/week   Barriers to discharge        Co-evaluation               AM-PAC PT "6 Clicks" Mobility  Outcome Measure Help needed turning from your back to your side while in a flat bed without using bedrails?: None Help needed moving from lying on your back to sitting on the side of a flat bed without using bedrails?: None Help needed moving to and from a bed to a chair (including a wheelchair)?: None Help needed standing up from a chair using your arms (e.g., wheelchair or bedside chair)?: None Help needed to walk in hospital room?: None Help needed climbing 3-5 steps with a railing? : None 6 Click Score: 24    End of Session   Activity Tolerance: Patient tolerated treatment well Patient left: in bed;with call bell/phone within reach;Other (comment) (transporting to vascular) Nurse Communication: Mobility status PT Visit Diagnosis: Unsteadiness on feet (R26.81);Other abnormalities of gait and mobility (R26.89);Difficulty in walking, not elsewhere classified (R26.2)    Time: 0539-7673 PT Time Calculation (min) (ACUTE ONLY): 28 min   Charges:   PT Evaluation $PT Eval Moderate Complexity: 1 Mod PT Treatments $Gait Training: 8-22 mins        08/19/2020  Jacinto Halim., PT Acute  Rehabilitation Services [phone]  (pager) [phone]  (office)  [name] 08/19/2020, 11:45 AM

## 2020-08-20 DIAGNOSIS — I350 Nonrheumatic aortic (valve) stenosis: Secondary | ICD-10-CM | POA: Diagnosis not present

## 2020-08-20 DIAGNOSIS — N179 Acute kidney failure, unspecified: Secondary | ICD-10-CM

## 2020-08-20 DIAGNOSIS — R0602 Shortness of breath: Secondary | ICD-10-CM | POA: Diagnosis not present

## 2020-08-20 DIAGNOSIS — I214 Non-ST elevation (NSTEMI) myocardial infarction: Secondary | ICD-10-CM | POA: Diagnosis not present

## 2020-08-20 DIAGNOSIS — J9601 Acute respiratory failure with hypoxia: Secondary | ICD-10-CM | POA: Diagnosis not present

## 2020-08-20 LAB — GLUCOSE, CAPILLARY
Glucose-Capillary: 147 mg/dL — ABNORMAL HIGH (ref 70–99)
Glucose-Capillary: 202 mg/dL — ABNORMAL HIGH (ref 70–99)
Glucose-Capillary: 166 mg/dL — ABNORMAL HIGH (ref 70–99)
Glucose-Capillary: 145 mg/dL — ABNORMAL HIGH (ref 70–99)

## 2020-08-20 LAB — BASIC METABOLIC PANEL
Anion gap: 9 (ref 5–15)
BUN: 21 mg/dL (ref 8–23)
CO2: 34 mmol/L — ABNORMAL HIGH (ref 22–32)
Calcium: 9 mg/dL (ref 8.9–10.3)
Chloride: 97 mmol/L — ABNORMAL LOW (ref 98–111)
Creatinine, Ser: 1.69 mg/dL — ABNORMAL HIGH (ref 0.61–1.24)
GFR, Estimated: 44 mL/min — ABNORMAL LOW (ref >60)
Glucose, Bld: 177 mg/dL — ABNORMAL HIGH (ref 70–99)
Potassium: 4.1 mmol/L (ref 3.5–5.1)
Sodium: 140 mmol/L (ref 135–145)

## 2020-08-20 LAB — T3, FREE: T3, Free: 2.3 pg/mL (ref 2.0–4.4)

## 2020-08-20 MED ORDER — SODIUM CHLORIDE 0.9 % IV SOLN: INTRAVENOUS | Status: DC

## 2020-08-20 NOTE — Progress Notes (Signed)
Physical Therapy Treatment Patient Details Name: Lawrence Rose MRN: [id number] DOB: Mar 08, 1956 Today's Date: 08/20/2020    History of Present Illness Lawrence Rose is an 65 y.o. male past medical history significant for obesity, essential hypertension, diabetes mellitus type 2, lumbar surgery,,  presents to med [facility] with shortness of breath for 1 week.  As per patient his Lasix dose was reduced about a month earlier.  CT was concerning for pneumonia, he also had elevated troponins.  He was placed IV heparin Rocephin and azithromycin and transferred to [facility].  The echo done on August 17, 2020 shows severe aortic stenosis with severe LVH.  Cardiac Cath completed 11/2, findings including pulm HTN and edema.    PT Comments    Patient progressing towards physical therapy goals. Patient had ambulated earlier in morning with cardiac rehab but motivated and wanted to walk again with therapy. Patient ambulated 200' with RW, min guard-supervision, and 2L O2 [place]. O2 sats maintained 95% during ambulation. Prior to ambulation at rest and removal of O2, sats were 86% on RA, placed O2 back on patient with O2 increasing to 94%. Patient continues to be limited by decreased activity tolerance and generalized weakness. PT will continue to follow acutely. Anticipate no PT follow up.     Follow Up Recommendations  No PT follow up;Supervision - Intermittent     Equipment Recommendations  3in1 (PT)    Recommendations for Other Services       Precautions / Restrictions Precautions Precautions: Fall Restrictions Weight Bearing Restrictions: No    Mobility  Bed Mobility Overal bed mobility:  (in chair on arrival)                Transfers Overall transfer level: Modified independent Equipment used: Rolling Elizabeth Haff (2 wheeled)                Ambulation/Gait Ambulation/Gait assistance: Min guard Gait Distance (Feet): 200 Feet Assistive device: Rolling Glorianne Proctor (2  wheeled) Gait Pattern/deviations: Step-through pattern     General Gait Details: wide BOS, slow but steady pace. On 2L O2, sats maintained at 95% with ambulation. Prior to ambulation at rest and removal of O2, sats were 86% on RA, put O2 back on at [employer] Mobility    Modified Rankin (Stroke Patients Only)       Balance Overall balance assessment: Needs assistance Sitting-balance support: No upper extremity supported;Feet supported Sitting balance-Leahy Scale: Good     Standing balance support: Bilateral upper extremity supported;During functional activity Standing balance-Leahy Scale: Fair                              Cognition Arousal/Alertness: Awake/alert Behavior During Therapy: WFL for tasks assessed/performed Overall Cognitive Status: Within Functional Limits for tasks assessed                                        Exercises      General Comments        Pertinent Vitals/Pain Pain Assessment: No/denies pain    Home Living                      Prior Function            PT Goals (current goals  can now be found in the care plan section) Acute Rehab PT Goals Patient Stated Goal: to go home PT Goal Formulation: With patient Time For Goal Achievement: 08/26/20 Potential to Achieve Goals: Good Progress towards PT goals: Progressing toward goals    Frequency    Min 3X/week      PT Plan Current plan remains appropriate    Co-evaluation              AM-PAC PT "6 Clicks" Mobility   Outcome Measure  Help needed turning from your back to your side while in a flat bed without using bedrails?: None Help needed moving from lying on your back to sitting on the side of a flat bed without using bedrails?: None Help needed moving to and from a bed to a chair (including a wheelchair)?: None Help needed standing up from a chair using your arms (e.g., wheelchair or bedside chair)?:  None Help needed to walk in hospital room?: None Help needed climbing 3-5 steps with a railing? : A Little 6 Click Score: 23    End of Session Equipment Utilized During Treatment: Gait belt;Oxygen Activity Tolerance: Patient tolerated treatment well Patient left: in chair;with call bell/phone within reach Nurse Communication: Mobility status PT Visit Diagnosis: Unsteadiness on feet (R26.81);Other abnormalities of gait and mobility (R26.89);Difficulty in walking, not elsewhere classified (R26.2)     Time: 8527-7824 PT Time Calculation (min) (ACUTE ONLY): 24 min  Charges:  $Therapeutic Activity: 23-37 mins                     Gregor Hams, PT, DPT Acute Rehabilitation Services Pager [phone] Office [phone]    Zannie Kehr Allred 08/20/2020, 12:21 PM

## 2020-08-20 NOTE — Progress Notes (Addendum)
 Progress Note  Patient Name: Lawrence Rose Date of Encounter: 08/20/2020  [facility] Cardiologist: Maggie Senseney A Purvi Ruehl, MD   Subjective   Breathing improving. Being evaluated by structural heart team. No chest pain.   Inpatient Medications    Scheduled Meds: . aspirin EC  81 mg Oral Daily  . chlorhexidine  15 mL Mouth Rinse BID  . cholecalciferol  1,000 Units Oral QHS  . cloNIDine  0.1 mg Oral BID  . diltiazem  240 mg Oral Daily  . folic acid  1 mg Oral Daily  . furosemide  40 mg Intravenous BID  . gabapentin  600 mg Oral BID  . heparin  5,000 Units Subcutaneous Q8H  . insulin aspart  0-9 Units Subcutaneous TID WC  . latanoprost  1 drop Both Eyes QHS  . lisinopril  40 mg Oral QHS  . mouth rinse  15 mL Mouth Rinse q12n4p  . metoprolol tartrate  25 mg Oral BID  . multivitamin with minerals  1 tablet Oral QHS  . oxyCODONE  10 mg Oral Q12H  . pantoprazole  40 mg Oral BID  . polyethylene glycol  17 g Oral Daily  . rosuvastatin  20 mg Oral Daily  . sodium chloride flush  3 mL Intravenous Q12H   Continuous Infusions: . sodium chloride 140 mL/hr at 08/18/20 0918  . sodium chloride     PRN Meds: sodium chloride, sodium chloride, acetaminophen, HYDROcodone-acetaminophen, nitroGLYCERIN, ondansetron (ZOFRAN) IV, oxyCODONE, sodium chloride flush   Vital Signs    Vitals:   08/19/20 2123 08/20/20 0018 08/20/20 0523 08/20/20 0543  BP: 137/68 127/68  (!) 109/54  Pulse: 79 77  72  Resp:    20  Temp:    98.5 F (36.9 C)  TempSrc:    Oral  SpO2: 98% 100%  98%  Weight:   (!) 137.9 kg   Height:        Intake/Output Summary (Last 24 hours) at 08/20/2020 0806 Last data filed at 08/20/2020 0645 Gross per 24 hour  Intake 960 ml  Output 3700 ml  Net -2740 ml   Last 3 Weights 08/20/2020 08/19/2020 08/18/2020  Weight (lbs) 304 lb 309 lb 8 oz 308 lb 10.3 oz  Weight (kg) 137.893 kg 140.388 kg 140 kg      Telemetry    NSR- Personally Reviewed  ECG    No new tracing    Physical Exam   GEN: No acute distress.   Neck: JVD difficult to evaluate due to girth  Cardiac: RRR, + 3/6 systolic murmurs, rubs, or gallops.  Respiratory: course breath sound  GI: Soft, nontender, non-distended  MS: ? trace edema; No deformity. Neuro:  Nonfocal  Psych: Normal affect   Labs    High Sensitivity Troponin:   Recent Labs  Lab 08/15/20 1955 08/15/20 2241 08/16/20 1459 08/16/20 1815 08/16/20 2105  TROPONINIHS 402* 330* 148* 164* 176*  175*      Chemistry Recent Labs  Lab 08/15/20 1801 08/16/20 1459 08/18/20 0320 08/18/20 1327 08/18/20 1331 08/19/20 0815 08/20/20 0219  NA 140   < > 139   < > 142 142 140  K 4.4   < > 4.2   < > 4.0 4.0 4.1  CL 100   < > 100  --   --  99 97*  CO2 29   < > 30  --   --  33* 34*  GLUCOSE 120*   < > 158*  --   --  154* 177*    BUN 17   < > 23  --   --  20 21  CREATININE 1.38*   < > 1.43*  --   --  1.36* 1.69*  CALCIUM 9.2   < > 8.7*  --   --  9.1 9.0  PROT 7.7  --   --   --   --   --   --   ALBUMIN 3.7  --   --   --   --   --   --   AST 17  --   --   --   --   --   --   ALT 16  --   --   --   --   --   --   ALKPHOS 50  --   --   --   --   --   --   BILITOT 0.4  --   --   --   --   --   --   GFRNONAA 56*   < > 54*  --   --  57* 44*  ANIONGAP 11   < > 9  --   --  10 9   < > = values in this interval not displayed.     Hematology Recent Labs  Lab 08/17/20 0351 08/17/20 0351 08/18/20 0320 08/18/20 0320 08/18/20 1327 08/18/20 1331 08/19/20 0219  WBC 13.9*  --  12.3*  --   --   --  8.9  RBC 4.02*  --  3.94*  --   --   --  4.04*  HGB 10.1*   < > 9.8*   < > 11.2* 11.2* 9.9*  HCT 33.9*   < > 33.9*   < > 33.0* 33.0* 34.8*  MCV 84.3  --  86.0  --   --   --  86.1  MCH 25.1*  --  24.9*  --   --   --  24.5*  MCHC 29.8*  --  28.9*  --   --   --  28.4*  RDW 15.3  --  15.3  --   --   --  15.3  PLT 322  --  307  --   --   --  279   < > = values in this interval not displayed.    BNP Recent Labs  Lab 08/15/20 1801  08/16/20 2105  BNP 152.3* 246.2*     DDimer  Recent Labs  Lab 08/16/20 1459  DDIMER 1.02*     Radiology    CARDIAC CATHETERIZATION  Result Date: 08/18/2020  Widely patent coronary arteries  High cardiac output: 9.06 L/min by Fick and 9.23 L/min by thermal dilution.  Etiology uncertain.  Suppressed TSH with normal T4 raising question of T3 thyrotoxicosis versus other mechanism.  Severe pulmonary hypertension, mean pressure 48 mmHg, based upon hemodynamics WHO group 2  Pulmonary capillary wedge pressure mean 34 mmHg, pulmonary vascular resistance 1.55 Woods units.  Mean right atrial pressure 19 mmHg  Aortic valve gradient peak to peak 33 mmHg with mean gradient 22 mmHg.  Aortic valve area 1.94 cm by Fick cardiac output and 1.98 cm by thermodilution  LV function normal by echocardiography with moderate LVH RECOMMENDATIONS:  Further work-up/management per treating team.  Needs further diuresis.  T3 level  CT CORONARY MORPH W/CTA COR W/SCORE W/CA W/CM &/OR WO/CM  Addendum Date: 08/20/2020   ADDENDUM REPORT: 08/20/2020 08:01 EXAM: OVER-READ INTERPRETATION  CT CHEST The following report is an  over-read performed by radiologist Dr. Royal Piedra [facility] on 08/20/2020. This over-read does not include interpretation of cardiac or coronary anatomy or pathology. The coronary calcium score and cardiac CTA interpretation by the cardiologist is attached. COMPARISON:  Chest CTA 08/15/2020. FINDINGS: Extracardiac findings will be described separately under dictation for contemporaneously obtained CTA chest, abdomen and pelvis. IMPRESSION: Please see separate dictation for contemporaneously obtained CTA chest, abdomen and pelvis 08/19/2020 for full description of relevant extracardiac findings. Electronically Signed   By: Trudie Reed M.D.   On: 08/20/2020 08:01   Result Date: 08/20/2020 MEDICATIONS: MEDICATIONS None EXAM: Cardiac TAVR CT TECHNIQUE: The patient was scanned on  a Siemens Force 192 slice scanner. A 120 kV retrospective scan was triggered in the descending thoracic aorta at 111 HU's. Gantry rotation speed was 270 msecs and collimation was .9 mm. No beta blockade or nitro were given. The 3D data set was reconstructed in 5% intervals of the R-R cycle. Systolic and diastolic phases were analyzed on a dedicated work station using MPR, MIP and VRT modes. The patient received 80 cc of contrast. FINDINGS: Aortic Valve: Calcium score 1645 Tri leaflet with restricted motion Aorta: No aneurysm moderate calcific atherosclerosis normal arch vessels Sino-tubular Junction: 25 mm Ascending Thoracic Aorta: 33 mm Aortic Arch: 25 mm Descending Thoracic Aorta: 25 mm Sinus of Valsalva Measurements: Non-coronary: 30.8 mm Right - coronary: 28.2 mm Left - coronary: 31.3 mm Coronary Artery Height above Annulus: Left Main: 11.1 mm above annulus Right Coronary: 15.3 mm above annulus Virtual Basal Annulus Measurements: Maximum/Minimum Diameter: 27.4 mm x 22.8 mm Perimeter: 82 mm Area: 505 mm2 Coronary Arteries: Sufficient height above annulus for deployment Optimum Fluoroscopic Angle for Delivery: LAO 3 Caudal 6 degrees IMPRESSION: 1. Tri-leaflet AV with calcium score 1645 and annular area of 505 mm2 suitable for a 26 mm Sapien 3 valve 2.  Coronary arteries sufficient height above annulus for deployment 3.  Optimum angiographic angle for deployment LAO 3 Caudal 6 degrees 4. Some nodular calcification in annulus at base of left coronary cusp 5.  Normal aortic root 3.3 cm Charlton Haws Electronically Signed: By: Charlton Haws M.D. On: 08/19/2020 18:00   VAS US DOPPLER PRE CABG  Result Date: 08/19/2020 PREOPERATIVE VASCULAR EVALUATION  Indications:  Pre-CABG. Risk Factors: Hypertension, Diabetes. Performing Technologist: Jannet Askew RCT RDMS  Examination Guidelines: A complete evaluation includes B-mode imaging, spectral Doppler, color Doppler, and power Doppler as needed of all accessible  portions of each vessel. Bilateral testing is considered an integral part of a complete examination. Limited examinations for reoccurring indications may be performed as noted.  Right Carotid Findings: +----------+--------+--------+--------+--------+------------------+           PSV cm/sEDV cm/sStenosisDescribeComments           +----------+--------+--------+--------+--------+------------------+ CCA Prox  97      20                                         +----------+--------+--------+--------+--------+------------------+ CCA Distal104     27                      intimal thickening +----------+--------+--------+--------+--------+------------------+ ICA Prox  118     36      1-39%           intimal thickening +----------+--------+--------+--------+--------+------------------+ ICA Distal132     39  intimal thickening +----------+--------+--------+--------+--------+------------------+ ECA       90      5                                          +----------+--------+--------+--------+--------+------------------+ Portions of this table do not appear on this page. +----------+--------+-------+--------+------------+           PSV cm/sEDV cmsDescribeArm Pressure +----------+--------+-------+--------+------------+ Subclavian188     4                           +----------+--------+-------+--------+------------+ +---------+--------+--+--------+--+---------+ VertebralPSV cm/s93EDV cm/s29Antegrade +---------+--------+--+--------+--+---------+ Left Carotid Findings: +----------+--------+--------+--------+--------+------------------+           PSV cm/sEDV cm/sStenosisDescribeComments           +----------+--------+--------+--------+--------+------------------+ CCA Prox  112     29                                         +----------+--------+--------+--------+--------+------------------+ CCA Distal105     23                       intimal thickening +----------+--------+--------+--------+--------+------------------+ ICA Prox  127     39                      intimal thickening +----------+--------+--------+--------+--------+------------------+ ICA Distal106     41                                         +----------+--------+--------+--------+--------+------------------+ ECA       102     13                                         +----------+--------+--------+--------+--------+------------------+ +----------+--------+--------+--------+------------+ SubclavianPSV cm/sEDV cm/sDescribeArm Pressure +----------+--------+--------+--------+------------+           212     7                            +----------+--------+--------+--------+------------+ +---------+--------+--+--------+--+---------+ VertebralPSV cm/s82EDV cm/s24Antegrade +---------+--------+--+--------+--+---------+  ABI Findings: +--------+------------------+-----+---------+--------+ Right   Rt Pressure (mmHg)IndexWaveform Comment  +--------+------------------+-----+---------+--------+ [id number]                    triphasic         +--------+------------------+-----+---------+--------+ +--------+------------------+-----+---------+-------+ Left    Lt Pressure (mmHg)IndexWaveform Comment +--------+------------------+-----+---------+-------+ Brachial120                    triphasic        +--------+------------------+-----+---------+-------+  Right Doppler Findings: +--------+--------+-----+---------+--------+ Site    PressureIndexDoppler  Comments +--------+--------+-----+---------+--------+ [id number]          triphasic         +--------+--------+-----+---------+--------+ Radial               triphasic         +--------+--------+-----+---------+--------+ Ulnar                triphasic         +--------+--------+-----+---------+--------+  Left Doppler Findings:  +--------+--------+-----+---------+--------+ Site    PressureIndexDoppler  Comments +--------+--------+-----+---------+--------+ Brachial120          triphasic         +--------+--------+-----+---------+--------+ Radial               triphasic         +--------+--------+-----+---------+--------+ Ulnar                triphasic         +--------+--------+-----+---------+--------+  Summary: Right Carotid: Velocities in the right ICA are consistent with a 1-39% stenosis.                The ECA appears <50% stenosed. Left Carotid: Velocities in the left ICA are consistent with a 1-39% stenosis.               The ECA appears <50% stenosed. Vertebrals: Bilateral vertebral arteries demonstrate antegrade flow. Right Upper Extremity: Doppler waveforms remain within normal limits with right radial compression. Doppler waveforms remain within normal limits with right ulnar compression. Left Upper Extremity: Doppler waveforms decrease 50% with left radial compression. Doppler waveform obliterate with left ulnar compression.  Electronically signed by Gretta Began MD on 08/19/2020 at 3:05:41 PM.    Final     Cardiac Studies   RIGHT/LEFT HEART CATH AND CORONARY ANGIOGRAPHY  09/17/2020  Conclusion   Widely patent coronary arteries  High cardiac output: 9.06 L/min by Fick and 9.23 L/min by thermal dilution.  Etiology uncertain.  Suppressed TSH with normal T4 raising question of T3 thyrotoxicosis versus other mechanism.  Severe pulmonary hypertension, mean pressure 48 mmHg, based upon hemodynamics WHO group 2  Pulmonary capillary wedge pressure mean 34 mmHg, pulmonary vascular resistance 1.55 Woods units.  Mean right atrial pressure 19 mmHg  Aortic valve gradient peak to peak 33 mmHg with mean gradient 22 mmHg.  Aortic valve area 1.94 cm by Fick cardiac output and 1.98 cm by thermodilution  LV function normal by echocardiography with moderate LVH   RECOMMENDATIONS:    Further  work-up/management per treating team.  Needs further diuresis.  T3 level  Echo 08/17/2020 1. Left ventricular ejection fraction, by estimation, is 60 to 65%. The  left ventricle has normal function. The left ventricle has no regional  wall motion abnormalities. There is moderate concentric left ventricular  hypertrophy. Left ventricular  diastolic parameters are indeterminate.   2. Right ventricular systolic function is normal. The right ventricular  size is normal.   3. Left atrial size was severely dilated.   4. The mitral valve is normal in structure. Trivial mitral valve  regurgitation. The posterior leaflet appears to have limited mobility with  mild flow acceleration noted across the mitral valve with mean gradient  at HR 101. There is mild narrowing  of the mitral valve orfice without significant stenosis.   5. The aortic valve is tricuspid. There is severe calcifcation of the  aortic valve. There is severe thickening of the aortic valve. Aortic valve  regurgitation is not visualized. Severe aortic valve stenosis with AVA 0.9  (by continuity), mean gradient  , peak gradient , Vmax 4.65cm2, DOI 0.2.   6. The inferior vena cava is dilated in size with >50% respiratory  variability, suggesting right atrial pressure of 8 mmHg.   Patient Profile     64 y.o. male with Diabetes with HTN, HLD, Morbid Obesity and OSA on CPAP, and CKD Stage III who presented for troponin elevation 08/16/20 who found to have valvular heart disease.  Assessment & Plan    1. Aortic  stenosis/Mitral Stenosis with pulmonary hypertension 2. Acute diastolic CHF - Echo and cath findings as above - Seen by structural heart team yesterday and felt likely TVAR candidate but this does not address mitral valve disease. Plan for TEE tomorrow for further evaluation. Underwent CTs yesterday.  - Diuresing well. Net I & O negative 5.5L. weight down 8 lb (145>>137lb).   3. Elevated troponin  -  HS-troponin peaked at 402 - mild non obstructive plaque by cath  - Continue ASA, Statin and BB  4. HTN - BP stable on current medications  5. Acute on CKD III - Seems baseline 1.3-1.4 since admit however Scr bumped to 1.69 today, likely due to dye from CTs yesterday - Follow closely    For questions or updates, please contact [facility] Please consult [email or web address] for contact info under        SignedManson Passey, Bhavinkumar Bhagat, PA  08/20/2020, 8:06 AM    Personally seen and examined. Agree with APP above with the following comments: 64 yo M with significant AS and Significant MS with pulmonary hypertension Feels better with diuresis.  No dysphagia or loose teeth.  Read for TEE tomorrow. Complicated by contrast induced nephropathy; AKI on CKD; hold diuresis today. Reviewed CT:  Trileaflet Aortic valve; Coronary heights above 10 mm. Continue BB.   Appreciate structural heart team recs  Christell ConstantMahesh A Taffie Eckmann, MD

## 2020-08-20 NOTE — H&P (View-Only) (Signed)
Progress Note  Patient Name: Lawrence Rose Date of Encounter: 08/20/2020  [facility] Cardiologist: Christell Constant, MD   Subjective   Breathing improving. Being evaluated by structural heart team. No chest pain.   Inpatient Medications    Scheduled Meds: . aspirin EC  81 mg Oral Daily  . chlorhexidine  15 mL Mouth Rinse BID  . cholecalciferol  1,000 Units Oral QHS  . cloNIDine  0.1 mg Oral BID  . diltiazem  240 mg Oral Daily  . folic acid  1 mg Oral Daily  . furosemide  40 mg Intravenous BID  . gabapentin  600 mg Oral BID  . heparin  5,000 Units Subcutaneous Q8H  . insulin aspart  0-9 Units Subcutaneous TID WC  . latanoprost  1 drop Both Eyes QHS  . lisinopril  40 mg Oral QHS  . mouth rinse  15 mL Mouth Rinse q12n4p  . metoprolol tartrate  25 mg Oral BID  . multivitamin with minerals  1 tablet Oral QHS  . oxyCODONE  10 mg Oral Q12H  . pantoprazole  40 mg Oral BID  . polyethylene glycol  17 g Oral Daily  . rosuvastatin  20 mg Oral Daily  . sodium chloride flush  3 mL Intravenous Q12H   Continuous Infusions: . sodium chloride 140 mL/hr at 08/18/20 0918  . sodium chloride     PRN Meds: sodium chloride, sodium chloride, acetaminophen, HYDROcodone-acetaminophen, nitroGLYCERIN, ondansetron (ZOFRAN) IV, oxyCODONE, sodium chloride flush   Vital Signs    Vitals:   08/19/20 2123 08/20/20 0018 08/20/20 0523 08/20/20 0543  BP: 137/68 127/68  (!) 109/54  Pulse: 79 77  72  Resp:    20  Temp:    98.5 F (36.9 C)  TempSrc:    Oral  SpO2: 98% 100%  98%  Weight:   (!) 137.9 kg   Height:        Intake/Output Summary (Last 24 hours) at 08/20/2020 0806 Last data filed at 08/20/2020 0645 Gross per 24 hour  Intake 960 ml  Output 3700 ml  Net -2740 ml   Last 3 Weights 08/20/2020 08/19/2020 08/18/2020  Weight (lbs) 304 lb 309 lb 8 oz 308 lb 10.3 oz  Weight (kg) 137.893 kg 140.388 kg 140 kg      Telemetry    NSR- Personally Reviewed  ECG    No new tracing    Physical Exam   GEN: No acute distress.   Neck: JVD difficult to evaluate due to girth  Cardiac: RRR, + 3/6 systolic murmurs, rubs, or gallops.  Respiratory: course breath sound  GI: Soft, nontender, non-distended  MS: ? trace edema; No deformity. Neuro:  Nonfocal  Psych: Normal affect   Labs    High Sensitivity Troponin:   Recent Labs  Lab 08/15/20 1955 08/15/20 2241 08/16/20 1459 08/16/20 1815 08/16/20 2105  TROPONINIHS 402* 330* 148* 164* 176*  175*      Chemistry Recent Labs  Lab 08/15/20 1801 08/16/20 1459 08/18/20 0320 08/18/20 1327 08/18/20 1331 08/19/20 0815 08/20/20 0219  NA 140   < > 139   < > 142 142 140  K 4.4   < > 4.2   < > 4.0 4.0 4.1  CL 100   < > 100  --   --  99 97*  CO2 29   < > 30  --   --  33* 34*  GLUCOSE 120*   < > 158*  --   --  154* 177*  BUN 17   < > 23  --   --  20 21  CREATININE 1.38*   < > 1.43*  --   --  1.36* 1.69*  CALCIUM 9.2   < > 8.7*  --   --  9.1 9.0  PROT 7.7  --   --   --   --   --   --   ALBUMIN 3.7  --   --   --   --   --   --   AST 17  --   --   --   --   --   --   ALT 16  --   --   --   --   --   --   ALKPHOS 50  --   --   --   --   --   --   BILITOT 0.4  --   --   --   --   --   --   GFRNONAA 56*   < > 54*  --   --  57* 44*  ANIONGAP 11   < > 9  --   --  10 9   < > = values in this interval not displayed.     Hematology Recent Labs  Lab 08/17/20 0351 08/17/20 0351 08/18/20 0320 08/18/20 0320 08/18/20 1327 08/18/20 1331 08/19/20 0219  WBC 13.9*  --  12.3*  --   --   --  8.9  RBC 4.02*  --  3.94*  --   --   --  4.04*  HGB 10.1*   < > 9.8*   < > 11.2* 11.2* 9.9*  HCT 33.9*   < > 33.9*   < > 33.0* 33.0* 34.8*  MCV 84.3  --  86.0  --   --   --  86.1  MCH 25.1*  --  24.9*  --   --   --  24.5*  MCHC 29.8*  --  28.9*  --   --   --  28.4*  RDW 15.3  --  15.3  --   --   --  15.3  PLT 322  --  307  --   --   --  279   < > = values in this interval not displayed.    BNP Recent Labs  Lab 08/15/20 1801  08/16/20 2105  BNP 152.3* 246.2*     DDimer  Recent Labs  Lab 08/16/20 1459  DDIMER 1.02*     Radiology    CARDIAC CATHETERIZATION  Result Date: 08/18/2020  Widely patent coronary arteries  High cardiac output: 9.06 L/min by Fick and 9.23 L/min by thermal dilution.  Etiology uncertain.  Suppressed TSH with normal T4 raising question of T3 thyrotoxicosis versus other mechanism.  Severe pulmonary hypertension, mean pressure 48 mmHg, based upon hemodynamics WHO group 2  Pulmonary capillary wedge pressure mean 34 mmHg, pulmonary vascular resistance 1.55 Woods units.  Mean right atrial pressure 19 mmHg  Aortic valve gradient peak to peak 33 mmHg with mean gradient 22 mmHg.  Aortic valve area 1.94 cm by Fick cardiac output and 1.98 cm by thermodilution  LV function normal by echocardiography with moderate LVH RECOMMENDATIONS:  Further work-up/management per treating team.  Needs further diuresis.  T3 level  CT CORONARY MORPH W/CTA COR W/SCORE W/CA W/CM &/OR WO/CM  Addendum Date: 08/20/2020   ADDENDUM REPORT: 08/20/2020 08:01 EXAM: OVER-READ INTERPRETATION  CT CHEST The following report is an  over-read performed by radiologist Dr. Royal Piedra [facility] on 08/20/2020. This over-read does not include interpretation of cardiac or coronary anatomy or pathology. The coronary calcium score and cardiac CTA interpretation by the cardiologist is attached. COMPARISON:  Chest CTA 08/15/2020. FINDINGS: Extracardiac findings will be described separately under dictation for contemporaneously obtained CTA chest, abdomen and pelvis. IMPRESSION: Please see separate dictation for contemporaneously obtained CTA chest, abdomen and pelvis 08/19/2020 for full description of relevant extracardiac findings. Electronically Signed   By: Trudie Reed M.D.   On: 08/20/2020 08:01   Result Date: 08/20/2020 MEDICATIONS: MEDICATIONS None EXAM: Cardiac TAVR CT TECHNIQUE: The patient was scanned on  a Siemens Force 192 slice scanner. A 120 kV retrospective scan was triggered in the descending thoracic aorta at 111 HU's. Gantry rotation speed was 270 msecs and collimation was .9 mm. No beta blockade or nitro were given. The 3D data set was reconstructed in 5% intervals of the R-R cycle. Systolic and diastolic phases were analyzed on a dedicated work station using MPR, MIP and VRT modes. The patient received 80 cc of contrast. FINDINGS: Aortic Valve: Calcium score 1645 Tri leaflet with restricted motion Aorta: No aneurysm moderate calcific atherosclerosis normal arch vessels Sino-tubular Junction: 25 mm Ascending Thoracic Aorta: 33 mm Aortic Arch: 25 mm Descending Thoracic Aorta: 25 mm Sinus of Valsalva Measurements: Non-coronary: 30.8 mm Right - coronary: 28.2 mm Left - coronary: 31.3 mm Coronary Artery Height above Annulus: Left Main: 11.1 mm above annulus Right Coronary: 15.3 mm above annulus Virtual Basal Annulus Measurements: Maximum/Minimum Diameter: 27.4 mm x 22.8 mm Perimeter: 82 mm Area: 505 mm2 Coronary Arteries: Sufficient height above annulus for deployment Optimum Fluoroscopic Angle for Delivery: LAO 3 Caudal 6 degrees IMPRESSION: 1. Tri-leaflet AV with calcium score 1645 and annular area of 505 mm2 suitable for a 26 mm Sapien 3 valve 2.  Coronary arteries sufficient height above annulus for deployment 3.  Optimum angiographic angle for deployment LAO 3 Caudal 6 degrees 4. Some nodular calcification in annulus at base of left coronary cusp 5.  Normal aortic root 3.3 cm Charlton Haws Electronically Signed: By: Charlton Haws M.D. On: 08/19/2020 18:00   VAS US DOPPLER PRE CABG  Result Date: 08/19/2020 PREOPERATIVE VASCULAR EVALUATION  Indications:  Pre-CABG. Risk Factors: Hypertension, Diabetes. Performing Technologist: Jannet Askew RCT RDMS  Examination Guidelines: A complete evaluation includes B-mode imaging, spectral Doppler, color Doppler, and power Doppler as needed of all accessible  portions of each vessel. Bilateral testing is considered an integral part of a complete examination. Limited examinations for reoccurring indications may be performed as noted.  Right Carotid Findings: +----------+--------+--------+--------+--------+------------------+           PSV cm/sEDV cm/sStenosisDescribeComments           +----------+--------+--------+--------+--------+------------------+ CCA Prox  97      20                                         +----------+--------+--------+--------+--------+------------------+ CCA Distal104     27                      intimal thickening +----------+--------+--------+--------+--------+------------------+ ICA Prox  118     36      1-39%           intimal thickening +----------+--------+--------+--------+--------+------------------+ ICA Distal132     39  intimal thickening +----------+--------+--------+--------+--------+------------------+ ECA       90      5                                          +----------+--------+--------+--------+--------+------------------+ Portions of this table do not appear on this page. +----------+--------+-------+--------+------------+           PSV cm/sEDV cmsDescribeArm Pressure +----------+--------+-------+--------+------------+ Subclavian188     4                           +----------+--------+-------+--------+------------+ +---------+--------+--+--------+--+---------+ VertebralPSV cm/s93EDV cm/s29Antegrade +---------+--------+--+--------+--+---------+ Left Carotid Findings: +----------+--------+--------+--------+--------+------------------+           PSV cm/sEDV cm/sStenosisDescribeComments           +----------+--------+--------+--------+--------+------------------+ CCA Prox  112     29                                         +----------+--------+--------+--------+--------+------------------+ CCA Distal105     23                       intimal thickening +----------+--------+--------+--------+--------+------------------+ ICA Prox  127     39                      intimal thickening +----------+--------+--------+--------+--------+------------------+ ICA Distal106     41                                         +----------+--------+--------+--------+--------+------------------+ ECA       102     13                                         +----------+--------+--------+--------+--------+------------------+ +----------+--------+--------+--------+------------+ SubclavianPSV cm/sEDV cm/sDescribeArm Pressure +----------+--------+--------+--------+------------+           212     7                            +----------+--------+--------+--------+------------+ +---------+--------+--+--------+--+---------+ VertebralPSV cm/s82EDV cm/s24Antegrade +---------+--------+--+--------+--+---------+  ABI Findings: +--------+------------------+-----+---------+--------+ Right   Rt Pressure (mmHg)IndexWaveform Comment  +--------+------------------+-----+---------+--------+ [id number]                    triphasic         +--------+------------------+-----+---------+--------+ +--------+------------------+-----+---------+-------+ Left    Lt Pressure (mmHg)IndexWaveform Comment +--------+------------------+-----+---------+-------+ Brachial120                    triphasic        +--------+------------------+-----+---------+-------+  Right Doppler Findings: +--------+--------+-----+---------+--------+ Site    PressureIndexDoppler  Comments +--------+--------+-----+---------+--------+ [id number]          triphasic         +--------+--------+-----+---------+--------+ Radial               triphasic         +--------+--------+-----+---------+--------+ Ulnar                triphasic         +--------+--------+-----+---------+--------+  Left Doppler Findings:  +--------+--------+-----+---------+--------+ Site    PressureIndexDoppler  Comments +--------+--------+-----+---------+--------+ Brachial120          triphasic         +--------+--------+-----+---------+--------+ Radial               triphasic         +--------+--------+-----+---------+--------+ Ulnar                triphasic         +--------+--------+-----+---------+--------+  Summary: Right Carotid: Velocities in the right ICA are consistent with a 1-39% stenosis.                The ECA appears <50% stenosed. Left Carotid: Velocities in the left ICA are consistent with a 1-39% stenosis.               The ECA appears <50% stenosed. Vertebrals: Bilateral vertebral arteries demonstrate antegrade flow. Right Upper Extremity: Doppler waveforms remain within normal limits with right radial compression. Doppler waveforms remain within normal limits with right ulnar compression. Left Upper Extremity: Doppler waveforms decrease 50% with left radial compression. Doppler waveform obliterate with left ulnar compression.  Electronically signed by Gretta Began MD on 08/19/2020 at 3:05:41 PM.    Final     Cardiac Studies   RIGHT/LEFT HEART CATH AND CORONARY ANGIOGRAPHY  09/17/2020  Conclusion   Widely patent coronary arteries  High cardiac output: 9.06 L/min by Fick and 9.23 L/min by thermal dilution.  Etiology uncertain.  Suppressed TSH with normal T4 raising question of T3 thyrotoxicosis versus other mechanism.  Severe pulmonary hypertension, mean pressure 48 mmHg, based upon hemodynamics WHO group 2  Pulmonary capillary wedge pressure mean 34 mmHg, pulmonary vascular resistance 1.55 Woods units.  Mean right atrial pressure 19 mmHg  Aortic valve gradient peak to peak 33 mmHg with mean gradient 22 mmHg.  Aortic valve area 1.94 cm by Fick cardiac output and 1.98 cm by thermodilution  LV function normal by echocardiography with moderate LVH   RECOMMENDATIONS:    Further  work-up/management per treating team.  Needs further diuresis.  T3 level  Echo 08/17/2020 1. Left ventricular ejection fraction, by estimation, is 60 to 65%. The  left ventricle has normal function. The left ventricle has no regional  wall motion abnormalities. There is moderate concentric left ventricular  hypertrophy. Left ventricular  diastolic parameters are indeterminate.   2. Right ventricular systolic function is normal. The right ventricular  size is normal.   3. Left atrial size was severely dilated.   4. The mitral valve is normal in structure. Trivial mitral valve  regurgitation. The posterior leaflet appears to have limited mobility with  mild flow acceleration noted across the mitral valve with mean gradient  at HR 101. There is mild narrowing  of the mitral valve orfice without significant stenosis.   5. The aortic valve is tricuspid. There is severe calcifcation of the  aortic valve. There is severe thickening of the aortic valve. Aortic valve  regurgitation is not visualized. Severe aortic valve stenosis with AVA 0.9  (by continuity), mean gradient  , peak gradient , Vmax 4.65cm2, DOI 0.2.   6. The inferior vena cava is dilated in size with >50% respiratory  variability, suggesting right atrial pressure of 8 mmHg.   Patient Profile     64 y.o. male with Diabetes with HTN, HLD, Morbid Obesity and OSA on CPAP, and CKD Stage III who presented for troponin elevation 08/16/20 who found to have valvular heart disease.  Assessment & Plan    1. Aortic  stenosis/Mitral Stenosis with pulmonary hypertension 2. Acute diastolic CHF - Echo and cath findings as above - Seen by structural heart team yesterday and felt likely TVAR candidate but this does not address mitral valve disease. Plan for TEE tomorrow for further evaluation. Underwent CTs yesterday.  - Diuresing well. Net I & O negative 5.5L. weight down 8 lb (145>>137lb).   3. Elevated troponin  -  HS-troponin peaked at 402 - mild non obstructive plaque by cath  - Continue ASA, Statin and BB  4. HTN - BP stable on current medications  5. Acute on CKD III - Seems baseline 1.3-1.4 since admit however Scr bumped to 1.69 today, likely due to dye from CTs yesterday - Follow closely    For questions or updates, please contact [facility] Please consult [email or web address] for contact info under        SignedManson Passey, Bhavinkumar Bhagat, PA  08/20/2020, 8:06 AM    Personally seen and examined. Agree with APP above with the following comments: 64 yo M with significant AS and Significant MS with pulmonary hypertension Feels better with diuresis.  No dysphagia or loose teeth.  Read for TEE tomorrow. Complicated by contrast induced nephropathy; AKI on CKD; hold diuresis today. Reviewed CT:  Trileaflet Aortic valve; Coronary heights above 10 mm. Continue BB.   Appreciate structural heart team recs  Christell ConstantMahesh A Aloma Boch, MD

## 2020-08-20 NOTE — Progress Notes (Addendum)
[facility] PROGRESS NOTE    Progress Note  Starr Engel  NWG:[id number] DOB: 05-04-1956 DOA: 08/15/2020 PCP: Pcp, No     Brief Narrative:   Loyal Holzheimer is an 64 y.o. male past medical history significant for obesity, essential hypertension diabetes mellitus type 2 presents to med [facility] with shortness of breath for 1 week.  As per patient his Lasix dose was reduced about a month earlier.  CT was concerning for pneumonia, he also had elevated troponins he was placed IV heparin Rocephin and azithromycin and transferred to [facility], the echo done on August 17, 2020 shows severe aortic stenosis with severe LVH  Assessment/Plan:   Acute respiratory failure with hypoxia (HCC) due to severe aortic stenosis leading to pulmonary edema: 2D echo on August 27, 2020 showed severe aortic stenosis and mitral stenosis Status post cardiac cath that showed pulmonary hypertension with a mean pressure of 48 mmHg, aortic stenosis with a mean valvular gradient of 51 mmHg and with mitral stenosis. About 5 L negative, with brisk urine output.  Mild increase in his creatinine will hold Lasix for now repeat a basic metabolic panel in the morning. CT surgery was consulted who recommended a TEE on 08/21/2020. Appreciate cardiology assistance. Underwent cardiac CT on 08/19/2020.  Acute kidney injury on chronic kidney disease stage IIIa: With a baseline creatinine of 1.3, after IV diuresis and contrast now 1.7 will hold diuresis today. Recheck a basic metabolic panel in the morning.  Elevated troponins: Currently on aspirin, heparin, statin and ACE inhibitor.  Chronic kidney disease stage III: Creatinine seems to be at baseline basic metabolic panel is pending today.  Low TSH: We will need to check free T4 and TSH in 6 weeks question sick euthyroid syndrome.  Chronic lower extremity edema: Has resolved.  Controlled diabetes mellitus type 2: Metformin was discontinued not on  insulin at home, his A1c is 5.5 we will need to monitor as an outpatient.  Normocytic anemia/anemia of chronic disease: Noted.  Essential hypertension: Blood pressure is fairly controlled currently on Cardizem clonidine metoprolol and lisinopril for now.  Obstructive sleep apnea: CPAP at bedtime.  Chronic pain: Continue Norco and Neurontin.    Obesity, Class III, BMI 40-49.9 (morbid obesity) (HCC) Estimated body mass index is 42.4 kg/m as calculated from the following:   Height as of this encounter:  (1.803 m).   Weight as of this encounter: 137.9 kg.   DVT prophylaxis: lovenox Family Communication:none Status is: Inpatient  Remains inpatient appropriate because:Hemodynamically unstable   Dispo: The patient is from: Home              Anticipated d/c is to: Home              Anticipated d/c date is: 2 days              Patient currently is not medically stable to d/c.        Code Status:     Code Status Orders  (From admission, onward)         Start     Ordered   08/16/20 1440  Full code  Continuous        08/16/20 1443        Code Status History    This patient has a current code status but no historical code status.   Advance Care Planning Activity        IV Access:    Peripheral IV   Procedures and diagnostic  studies:   CARDIAC CATHETERIZATION  Result Date: 08/18/2020  Widely patent coronary arteries  High cardiac output: 9.06 L/min by Fick and 9.23 L/min by thermal dilution.  Etiology uncertain.  Suppressed TSH with normal T4 raising question of T3 thyrotoxicosis versus other mechanism.  Severe pulmonary hypertension, mean pressure 48 mmHg, based upon hemodynamics WHO group 2  Pulmonary capillary wedge pressure mean 34 mmHg, pulmonary vascular resistance 1.55 Woods units.  Mean right atrial pressure 19 mmHg  Aortic valve gradient peak to peak 33 mmHg with mean gradient 22 mmHg.  Aortic valve area 1.94 cm by Fick cardiac output  and 1.98 cm by thermodilution  LV function normal by echocardiography with moderate LVH RECOMMENDATIONS:  Further work-up/management per treating team.  Needs further diuresis.  T3 level  CT CORONARY MORPH W/CTA COR W/SCORE W/CA W/CM &/OR WO/CM  Addendum Date: 08/20/2020   ADDENDUM REPORT: 08/20/2020 08:01 EXAM: OVER-READ INTERPRETATION  CT CHEST The following report is an over-read performed by radiologist Dr. Royal Piedra [facility] on 08/20/2020. This over-read does not include interpretation of cardiac or coronary anatomy or pathology. The coronary calcium score and cardiac CTA interpretation by the cardiologist is attached. COMPARISON:  Chest CTA 08/15/2020. FINDINGS: Extracardiac findings will be described separately under dictation for contemporaneously obtained CTA chest, abdomen and pelvis. IMPRESSION: Please see separate dictation for contemporaneously obtained CTA chest, abdomen and pelvis 08/19/2020 for full description of relevant extracardiac findings. Electronically Signed   By: Trudie Reed M.D.   On: 08/20/2020 08:01   Result Date: 08/20/2020 MEDICATIONS: MEDICATIONS None EXAM: Cardiac TAVR CT TECHNIQUE: The patient was scanned on a Siemens Force 192 slice scanner. A 120 kV retrospective scan was triggered in the descending thoracic aorta at 111 HU's. Gantry rotation speed was 270 msecs and collimation was .9 mm. No beta blockade or nitro were given. The 3D data set was reconstructed in 5% intervals of the R-R cycle. Systolic and diastolic phases were analyzed on a dedicated work station using MPR, MIP and VRT modes. The patient received 80 cc of contrast. FINDINGS: Aortic Valve: Calcium score 1645 Tri leaflet with restricted motion Aorta: No aneurysm moderate calcific atherosclerosis normal arch vessels Sino-tubular Junction: 25 mm Ascending Thoracic Aorta: 33 mm Aortic Arch: 25 mm Descending Thoracic Aorta: 25 mm Sinus of Valsalva Measurements: Non-coronary: 30.8  mm Right - coronary: 28.2 mm Left - coronary: 31.3 mm Coronary Artery Height above Annulus: Left Main: 11.1 mm above annulus Right Coronary: 15.3 mm above annulus Virtual Basal Annulus Measurements: Maximum/Minimum Diameter: 27.4 mm x 22.8 mm Perimeter: 82 mm Area: 505 mm2 Coronary Arteries: Sufficient height above annulus for deployment Optimum Fluoroscopic Angle for Delivery: LAO 3 Caudal 6 degrees IMPRESSION: 1. Tri-leaflet AV with calcium score 1645 and annular area of 505 mm2 suitable for a 26 mm Sapien 3 valve 2.  Coronary arteries sufficient height above annulus for deployment 3.  Optimum angiographic angle for deployment LAO 3 Caudal 6 degrees 4. Some nodular calcification in annulus at base of left coronary cusp 5.  Normal aortic root 3.3 cm Charlton Haws Electronically Signed: By: Charlton Haws M.D. On: 08/19/2020 18:00   VAS US DOPPLER PRE CABG  Result Date: 08/19/2020 PREOPERATIVE VASCULAR EVALUATION  Indications:  Pre-CABG. Risk Factors: Hypertension, Diabetes. Performing Technologist: Jannet Askew RCT RDMS  Examination Guidelines: A complete evaluation includes B-mode imaging, spectral Doppler, color Doppler, and power Doppler as needed of all accessible portions of each vessel. Bilateral testing is considered an integral part of a complete  examination. Limited examinations for reoccurring indications may be performed as noted.  Right Carotid Findings: +----------+--------+--------+--------+--------+------------------+           PSV cm/sEDV cm/sStenosisDescribeComments           +----------+--------+--------+--------+--------+------------------+ CCA Prox  97      20                                         +----------+--------+--------+--------+--------+------------------+ CCA Distal104     27                      intimal thickening +----------+--------+--------+--------+--------+------------------+ ICA Prox  118     36      1-39%           intimal thickening  +----------+--------+--------+--------+--------+------------------+ ICA Distal132     39                      intimal thickening +----------+--------+--------+--------+--------+------------------+ ECA       90      5                                          +----------+--------+--------+--------+--------+------------------+ Portions of this table do not appear on this page. +----------+--------+-------+--------+------------+           PSV cm/sEDV cmsDescribeArm Pressure +----------+--------+-------+--------+------------+ Subclavian188     4                           +----------+--------+-------+--------+------------+ +---------+--------+--+--------+--+---------+ VertebralPSV cm/s93EDV cm/s29Antegrade +---------+--------+--+--------+--+---------+ Left Carotid Findings: +----------+--------+--------+--------+--------+------------------+           PSV cm/sEDV cm/sStenosisDescribeComments           +----------+--------+--------+--------+--------+------------------+ CCA Prox  112     29                                         +----------+--------+--------+--------+--------+------------------+ CCA Distal105     23                      intimal thickening +----------+--------+--------+--------+--------+------------------+ ICA Prox  127     39                      intimal thickening +----------+--------+--------+--------+--------+------------------+ ICA Distal106     41                                         +----------+--------+--------+--------+--------+------------------+ ECA       102     13                                         +----------+--------+--------+--------+--------+------------------+ +----------+--------+--------+--------+------------+ SubclavianPSV cm/sEDV cm/sDescribeArm Pressure +----------+--------+--------+--------+------------+           212     7                             +----------+--------+--------+--------+------------+ +---------+--------+--+--------+--+---------+ VertebralPSV cm/s82EDV  cm/s24Antegrade +---------+--------+--+--------+--+---------+  ABI Findings: +--------+------------------+-----+---------+--------+ Right   Rt Pressure (mmHg)IndexWaveform Comment  +--------+------------------+-----+---------+--------+ [id number]                    triphasic         +--------+------------------+-----+---------+--------+ +--------+------------------+-----+---------+-------+ Left    Lt Pressure (mmHg)IndexWaveform Comment +--------+------------------+-----+---------+-------+ Brachial120                    triphasic        +--------+------------------+-----+---------+-------+  Right Doppler Findings: +--------+--------+-----+---------+--------+ Site    PressureIndexDoppler  Comments +--------+--------+-----+---------+--------+ [id number]          triphasic         +--------+--------+-----+---------+--------+ Radial               triphasic         +--------+--------+-----+---------+--------+ Ulnar                triphasic         +--------+--------+-----+---------+--------+  Left Doppler Findings: +--------+--------+-----+---------+--------+ Site    PressureIndexDoppler  Comments +--------+--------+-----+---------+--------+ [id number]          triphasic         +--------+--------+-----+---------+--------+ Radial               triphasic         +--------+--------+-----+---------+--------+ Ulnar                triphasic         +--------+--------+-----+---------+--------+  Summary: Right Carotid: Velocities in the right ICA are consistent with a 1-39% stenosis.                The ECA appears <50% stenosed. Left Carotid: Velocities in the left ICA are consistent with a 1-39% stenosis.               The ECA appears <50% stenosed. Vertebrals: Bilateral vertebral arteries demonstrate antegrade flow. Right  Upper Extremity: Doppler waveforms remain within normal limits with right radial compression. Doppler waveforms remain within normal limits with right ulnar compression. Left Upper Extremity: Doppler waveforms decrease 50% with left radial compression. Doppler waveform obliterate with left ulnar compression.  Electronically signed by Gretta Began MD on 08/19/2020 at 3:05:41 PM.    Final      Medical Consultants:    None.  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  Objective:    Vitals:   08/19/20 2123 08/20/20 0018 08/20/20 0523 08/20/20 0543  BP: 137/68 127/68  (!) 109/54  Pulse: 79 77  72  Resp:    20  Temp:    98.5 F (36.9 C)  TempSrc:    Oral  SpO2: 98% 100%  98%  Weight:   (!) 137.9 kg   Height:       SpO2: 98 % O2 Flow Rate (L/min): 2 L/min   Intake/Output Summary (Last 24 hours) at 08/20/2020 0852 Last data filed at 08/20/2020 0828 Gross per 24 hour  Intake 1080 ml  Output 3700 ml  Net -2620 ml   Filed Weights   08/18/20 0529 08/19/20 0419 08/20/20 0523  Weight: (!) 140 kg (!) 140.4 kg (!) 137.9 kg    Exam: General exam: In no acute distress. Respiratory system: Good air movement and clear to auscultation. Cardiovascular system: S1 & S2 heard, RRR. No JVD. Gastrointestinal system: Abdomen is nondistended, soft and nontender.  Extremities: No pedal edema. Skin: No  rashes, lesions or ulcers Psychiatry: Judgement and insight appear normal. Mood & affect appropriate.  Data Reviewed:    Labs: Basic Metabolic Panel: Recent Labs  Lab 08/16/20 1459 08/16/20 1459 08/17/20 0351 08/17/20 0351 08/18/20 0320 08/18/20 0320 08/18/20 1327 08/18/20 1327 08/18/20 1331 08/18/20 1331 08/19/20 0815 08/20/20 0219  NA 139   < > 139   < > 139  --  143  --  142  --  142 140  K 4.2   < > 3.9   < > 4.2   < > 4.0   < > 4.0   < > 4.0 4.1  CL 95*  --  99  --  100  --   --   --   --    --  99 97*  CO2 29  --  28  --  30  --   --   --   --   --  33* 34*  GLUCOSE 209*  --  166*  --  158*  --   --   --   --   --  154* 177*  BUN 22  --  23  --  23  --   --   --   --   --  20 21  CREATININE 1.41*  --  1.43*  --  1.43*  --   --   --   --   --  1.36* 1.69*  CALCIUM 9.5  --  8.9  --  8.7*  --   --   --   --   --  9.1 9.0  MG 2.0  --   --   --   --   --   --   --   --   --   --   --    < > = values in this interval not displayed.   GFR Estimated Creatinine Clearance: 61 mL/min (A) (by C-G formula based on SCr of 1.69 mg/dL (H)). Liver Function Tests: Recent Labs  Lab 08/15/20 1801  AST 17  ALT 16  ALKPHOS 50  BILITOT 0.4  PROT 7.7  ALBUMIN 3.7   No results for input(s): LIPASE, AMYLASE in the last 168 hours. No results for input(s): AMMONIA in the last 168 hours. Coagulation profile No results for input(s): INR, PROTIME in the last 168 hours. COVID-19 Labs  No results for input(s): DDIMER, FERRITIN, LDH, CRP in the last 72 hours.  Lab Results  Component Value Date   SARSCOV2NAA NEGATIVE 08/15/2020    CBC: Recent Labs  Lab 08/15/20 1801 08/15/20 1801 08/16/20 1459 08/16/20 1459 08/17/20 0351 08/18/20 0320 08/18/20 1327 08/18/20 1331 08/19/20 0219  WBC 8.4  --  10.8*  --  13.9* 12.3*  --   --  8.9  NEUTROABS 6.2  --   --   --   --   --   --   --   --   HGB 10.1*   < > 10.6*   < > 10.1* 9.8* 11.2* 11.2* 9.9*  HCT 34.0*   < > 36.1*   < > 33.9* 33.9* 33.0* 33.0* 34.8*  MCV 84.4  --  83.8  --  84.3 86.0  --   --  86.1  PLT 306  --  340  --  322 307  --   --  279   < > = values in this interval not displayed.   Cardiac Enzymes: No results for input(s): CKTOTAL, CKMB, CKMBINDEX, TROPONINI  in the last 168 hours. BNP (last 3 results) No results for input(s): PROBNP in the last 8760 hours. CBG: Recent Labs  Lab 08/19/20 0611 08/19/20 1238 08/19/20 1753 08/19/20 2114 08/20/20 0553  GLUCAP 125* 186* 126* 237* 147*   D-Dimer: No results for input(s):  DDIMER in the last 72 hours. Hgb A1c: No results for input(s): HGBA1C in the last 72 hours. Lipid Profile: No results for input(s): CHOL, HDL, LDLCALC, TRIG, CHOLHDL, LDLDIRECT in the last 72 hours. Thyroid function studies: Recent Labs    08/19/20 0219  T3FREE 2.3   Anemia work up: No results for input(s): VITAMINB12, FOLATE, FERRITIN, TIBC, IRON, RETICCTPCT in the last 72 hours. Sepsis Labs: Recent Labs  Lab 08/16/20 1459 08/17/20 0351 08/18/20 0320 08/19/20 0219  WBC 10.8* 13.9* 12.3* 8.9   Microbiology Recent Results (from the past 240 hour(s))  Respiratory Panel by RT PCR (Flu A&B, Covid) - Nasopharyngeal Swab     Status: None   Collection Time: 08/15/20  5:19 PM   Specimen: Nasopharyngeal Swab  Result Value Ref Range Status   SARS Coronavirus 2 by RT PCR NEGATIVE NEGATIVE Final    Comment: (NOTE) SARS-CoV-2 target nucleic acids are NOT DETECTED.  The SARS-CoV-2 RNA is generally detectable in upper respiratoy specimens during the acute phase of infection. The lowest concentration of SARS-CoV-2 viral copies this assay can detect is 131 copies/mL. A negative result does not preclude SARS-Cov-2 infection and should not be used as the sole basis for treatment or other patient management decisions. A negative result may occur with  improper specimen collection/handling, submission of specimen other than nasopharyngeal swab, presence of viral mutation(s) within the areas targeted by this assay, and inadequate number of viral copies (<131 copies/mL). A negative result must be combined with clinical observations, patient history, and epidemiological information. The expected result is Negative.  Fact Sheet for Patients:  https://www.moore.com/  Fact Sheet for Healthcare Providers:  https://www.young.biz/  This test is no t yet approved or cleared by the [place] FDA and  has been authorized for detection and/or diagnosis of  SARS-CoV-2 by FDA under an Emergency Use Authorization (EUA). This EUA will remain  in effect (meaning this test can be used) for the duration of the COVID-19 declaration under Section 564(b)(1) of the Act, 21 U.S.C. section 360bbb-3(b)(1), unless the authorization is terminated or revoked sooner.     Influenza A by PCR NEGATIVE NEGATIVE Final   Influenza B by PCR NEGATIVE NEGATIVE Final    Comment: (NOTE) The Xpert Xpress SARS-CoV-2/FLU/RSV assay is intended as an aid in  the diagnosis of influenza from Nasopharyngeal swab specimens and  should not be used as a sole basis for treatment. Nasal washings and  aspirates are unacceptable for Xpert Xpress SARS-CoV-2/FLU/RSV  testing.  Fact Sheet for Patients: https://www.moore.com/  Fact Sheet for Healthcare Providers: https://www.young.biz/  This test is not yet approved or cleared by the [place] FDA and  has been authorized for detection and/or diagnosis of SARS-CoV-2 by  FDA under an Emergency Use Authorization (EUA). This EUA will remain  in effect (meaning this test can be used) for the duration of the  Covid-19 declaration under Section 564(b)(1) of the Act, 21  U.S.C. section 360bbb-3(b)(1), unless the authorization is  terminated or revoked. Performed at [facility], [address] [place], [place] [address]   Culture, blood (routine x 2) Call MD if unable to obtain prior to antibiotics being given     Status: None (  Preliminary result)   Collection Time: 08/16/20  9:04 PM   Specimen: BLOOD  Result Value Ref Range Status   Specimen Description BLOOD RIGHT ARM  Final   Special Requests   Final    BOTTLES DRAWN AEROBIC AND ANAEROBIC Blood Culture results may not be optimal due to an excessive volume of blood received in culture bottles   Culture   Final    NO GROWTH 4 DAYS Performed at [facility], [address] [place], [place] [address]    Report Status  PENDING  Incomplete  Culture, blood (routine x 2) Call MD if unable to obtain prior to antibiotics being given     Status: None (Preliminary result)   Collection Time: 08/16/20  9:05 PM   Specimen: BLOOD RIGHT HAND  Result Value Ref Range Status   Specimen Description BLOOD RIGHT HAND  Final   Special Requests   Final    BOTTLES DRAWN AEROBIC ONLY Blood Culture adequate volume   Culture   Final    NO GROWTH 4 DAYS Performed at [facility], [address], [place], [place] [address]    Report Status PENDING  Incomplete     Medications:   . aspirin EC  81 mg Oral Daily  . chlorhexidine  15 mL Mouth Rinse BID  . cholecalciferol  1,000 Units Oral QHS  . cloNIDine  0.1 mg Oral BID  . diltiazem  240 mg Oral Daily  . folic acid  1 mg Oral Daily  . furosemide  40 mg Intravenous BID  . gabapentin  600 mg Oral BID  . heparin  5,000 Units Subcutaneous Q8H  . insulin aspart  0-9 Units Subcutaneous TID WC  . latanoprost  1 drop Both Eyes QHS  . lisinopril  40 mg Oral QHS  . mouth rinse  15 mL Mouth Rinse q12n4p  . metoprolol tartrate  25 mg Oral BID  . multivitamin with minerals  1 tablet Oral QHS  . oxyCODONE  10 mg Oral Q12H  . pantoprazole  40 mg Oral BID  . polyethylene glycol  17 g Oral Daily  . rosuvastatin  20 mg Oral Daily  . sodium chloride flush  3 mL Intravenous Q12H   Continuous Infusions: . sodium chloride 140 mL/hr at 08/18/20 0918  . sodium chloride        LOS: 4 days   Marinda Elk  [facility]  08/20/2020, 8:52 AM

## 2020-08-20 NOTE — Progress Notes (Signed)
CARDIAC REHAB PHASE I   PRE:  Rate/Rhythm: 80 SR  BP:  Supine: 107/41  Sitting:   Standing:    SaO2: 96% 2L  MODE:  Ambulation: 240 ft   POST:  Rate/Rhythm: 110 ST hall and 85 in room sitting  BP:  Supine:   Sitting: 120/55  Standing:    SaO2: 89-90% 2L 5625-6389 Pt walked 240 ft on 2L with gait belt use, rolling walker and asst x 1. Stated his tolerance walking much better and would not have been able to do that days ago. C/o knees bone on bone. Some DOE. To recliner with call bell after walk.    Luetta Nutting, RN BSN  08/20/2020 9:48 AM

## 2020-08-21 ENCOUNTER — Inpatient Hospital Stay (HOSPITAL_COMMUNITY): Payer: Medicare HMO

## 2020-08-21 ENCOUNTER — Encounter (HOSPITAL_COMMUNITY)
Admission: EM | Disposition: A | Discharge status: Home / Self Care | Payer: Self-pay | Source: Home / Self Care | Attending: Internal Medicine

## 2020-08-21 ENCOUNTER — Inpatient Hospital Stay (HOSPITAL_COMMUNITY): Payer: Medicare HMO | Admitting: Certified Registered Nurse Anesthetist

## 2020-08-21 ENCOUNTER — Encounter (HOSPITAL_COMMUNITY): Payer: Self-pay | Admitting: Internal Medicine

## 2020-08-21 DIAGNOSIS — N1831 Chronic kidney disease, stage 3a: Secondary | ICD-10-CM | POA: Diagnosis not present

## 2020-08-21 DIAGNOSIS — I350 Nonrheumatic aortic (valve) stenosis: Secondary | ICD-10-CM

## 2020-08-21 DIAGNOSIS — N179 Acute kidney failure, unspecified: Secondary | ICD-10-CM | POA: Diagnosis not present

## 2020-08-21 DIAGNOSIS — J9601 Acute respiratory failure with hypoxia: Secondary | ICD-10-CM | POA: Diagnosis not present

## 2020-08-21 DIAGNOSIS — I2510 Atherosclerotic heart disease of native coronary artery without angina pectoris: Secondary | ICD-10-CM

## 2020-08-21 HISTORY — PX: TEE WITHOUT CARDIOVERSION: SHX5443

## 2020-08-21 LAB — GLUCOSE, CAPILLARY
Glucose-Capillary: 156 mg/dL — ABNORMAL HIGH (ref 70–99)
Glucose-Capillary: 174 mg/dL — ABNORMAL HIGH (ref 70–99)
Glucose-Capillary: 184 mg/dL — ABNORMAL HIGH (ref 70–99)
Glucose-Capillary: 220 mg/dL — ABNORMAL HIGH (ref 70–99)

## 2020-08-21 LAB — PULMONARY FUNCTION TEST
DL/VA % pred: 133 %
DL/VA: 5.49 ml/min/mmHg/L
DLCO cor % pred: 77 %
DLCO cor: 21.36 ml/min/mmHg
DLCO unc % pred: 65 %
DLCO unc: 17.86 ml/min/mmHg
FEF 25-75 Post: 1.05 L/sec
FEF 25-75 Pre: 1.33 L/sec
FEF2575-%Change-Post: -20 %
FEF2575-%Pred-Post: 38 %
FEF2575-%Pred-Pre: 48 %
FEV1-%Change-Post: -3 %
FEV1-%Pred-Post: 60 %
FEV1-%Pred-Pre: 62 %
FEV1-Post: 1.87 L
FEV1-Pre: 1.94 L
FEV1FVC-%Change-Post: 5 %
FEV1FVC-%Pred-Pre: 95 %
FEV6-%Change-Post: -5 %
FEV6-%Pred-Post: 61 %
FEV6-%Pred-Pre: 65 %
FEV6-Post: 2.41 L
FEV6-Pre: 2.55 L
FEV6FVC-%Change-Post: 3 %
FEV6FVC-%Pred-Post: 104 %
FEV6FVC-%Pred-Pre: 100 %
FVC-%Change-Post: -9 %
FVC-%Pred-Post: 59 %
FVC-%Pred-Pre: 65 %
FVC-Post: 2.41 L
FVC-Pre: 2.65 L
Post FEV1/FVC ratio: 78 %
Post FEV6/FVC ratio: 100 %
Pre FEV1/FVC ratio: 73 %
Pre FEV6/FVC Ratio: 96 %
RV % pred: 93 %
RV: 2.26 L
TLC % pred: 66 %
TLC: 4.81 L

## 2020-08-21 LAB — BASIC METABOLIC PANEL
Anion gap: 9 (ref 5–15)
BUN: 25 mg/dL — ABNORMAL HIGH (ref 8–23)
CO2: 33 mmol/L — ABNORMAL HIGH (ref 22–32)
Calcium: 8.8 mg/dL — ABNORMAL LOW (ref 8.9–10.3)
Chloride: 97 mmol/L — ABNORMAL LOW (ref 98–111)
Creatinine, Ser: 1.65 mg/dL — ABNORMAL HIGH (ref 0.61–1.24)
GFR, Estimated: 46 mL/min — ABNORMAL LOW (ref >60)
Glucose, Bld: 174 mg/dL — ABNORMAL HIGH (ref 70–99)
Potassium: 4.2 mmol/L (ref 3.5–5.1)
Sodium: 139 mmol/L (ref 135–145)

## 2020-08-21 LAB — ECHO TEE
AR max vel: 1.01 cm2
AV Area VTI: 1.04 cm2
AV Area mean vel: 0.92 cm2
AV Mean grad: 41.5 mmHg
AV Peak grad: 66.7 mmHg
Ao pk vel: 4.09 m/s

## 2020-08-21 LAB — CULTURE, BLOOD (ROUTINE X 2)
Culture: NO GROWTH
Culture: NO GROWTH
Special Requests: ADEQUATE

## 2020-08-21 SURGERY — ECHOCARDIOGRAM, TRANSESOPHAGEAL
Anesthesia: General

## 2020-08-21 MED ORDER — LACTATED RINGERS IV SOLN: INTRAVENOUS | Status: DC

## 2020-08-21 MED ORDER — SODIUM CHLORIDE 0.9 % IV SOLN: INTRAVENOUS | Status: DC | PRN

## 2020-08-21 MED ORDER — PROPOFOL 500 MG/50ML IV EMUL
INTRAVENOUS | Status: DC | PRN
  Administered 2020-08-21: 100 ug/kg/min via INTRAVENOUS

## 2020-08-21 MED ORDER — BUTAMBEN-TETRACAINE-BENZOCAINE 2-2-14 % EX AERO
INHALATION_SPRAY | CUTANEOUS | Status: DC | PRN
  Administered 2020-08-21: 2 via TOPICAL

## 2020-08-21 MED ORDER — ALBUTEROL SULFATE (2.5 MG/3ML) 0.083% IN NEBU
2.5000 mg | INHALATION_SOLUTION | Freq: Once | RESPIRATORY_TRACT | Status: AC
  Administered 2020-08-21: 2.5 mg via RESPIRATORY_TRACT

## 2020-08-21 MED ORDER — PROPOFOL 10 MG/ML IV BOLUS
INTRAVENOUS | Status: DC | PRN
  Administered 2020-08-21: 20 mg via INTRAVENOUS
  Administered 2020-08-21: 30 mg via INTRAVENOUS

## 2020-08-21 NOTE — Interval H&P Note (Signed)
History and Physical Interval Note:  08/21/2020 8:27 AM  [name]  has presented today for surgery, with the diagnosis of AORTIC STENOSIS.  The various methods of treatment have been discussed with the patient and family. After consideration of risks, benefits and other options for treatment, the patient has consented to  Procedure(s): TRANSESOPHAGEAL ECHOCARDIOGRAM (TEE) (N/A) as a surgical intervention.  The patient's history has been reviewed, patient examined, no change in status, stable for surgery.  I have reviewed the patient's chart and labs.  Questions were answered to the patient's satisfaction.     Lawrence Rose

## 2020-08-21 NOTE — Progress Notes (Addendum)
Progress Note  Patient Name: Lawrence Rose Date of Encounter: 08/21/2020  [facility] Cardiologist: Christell ConstantMahesh A [place] Wherry, MD   Subjective   Breathing back to normal. No chest pain . No MS by TEE this morning.   Inpatient Medications    Scheduled Meds: . aspirin EC  81 mg Oral Daily  . chlorhexidine  15 mL Mouth Rinse BID  . cholecalciferol  1,000 Units Oral QHS  . cloNIDine  0.1 mg Oral BID  . diltiazem  240 mg Oral Daily  . folic acid  1 mg Oral Daily  . gabapentin  600 mg Oral BID  . heparin  5,000 Units Subcutaneous Q8H  . insulin aspart  0-9 Units Subcutaneous TID WC  . latanoprost  1 drop Both Eyes QHS  . lisinopril  40 mg Oral QHS  . mouth rinse  15 mL Mouth Rinse q12n4p  . metoprolol tartrate  25 mg Oral BID  . multivitamin with minerals  1 tablet Oral QHS  . oxyCODONE  10 mg Oral Q12H  . pantoprazole  40 mg Oral BID  . rosuvastatin  20 mg Oral Daily  . sodium chloride flush  3 mL Intravenous Q12H   Continuous Infusions: . sodium chloride 140 mL/hr at 08/18/20 0918  . sodium chloride    . lactated ringers Stopped (08/21/20 0858)   PRN Meds: sodium chloride, sodium chloride, acetaminophen, HYDROcodone-acetaminophen, nitroGLYCERIN, ondansetron (ZOFRAN) IV, oxyCODONE, sodium chloride flush   Vital Signs    Vitals:   08/21/20 0740 08/21/20 0856 08/21/20 0906 08/21/20 1006  BP: (!) 149/75 (!) 126/55 130/71 (!) 143/80  Pulse: 81 81 80 75  Resp: 16 19 12 20   Temp: (!) 97.5 F (36.4 C) 97.9 F (36.6 C)  99 F (37.2 C)  TempSrc: Temporal Temporal  Oral  SpO2: 90% 100% 93% 97%  Weight: (!) 137.5 kg     Height: 5\' 11"  (1.803 m)       Intake/Output Summary (Last 24 hours) at 08/21/2020 1055 Last data filed at 08/21/2020 1006 Gross per 24 hour  Intake 1378 ml  Output 1700 ml  Net -322 ml   Last 3 Weights 08/21/2020 08/21/2020 08/20/2020  Weight (lbs) 303 lb 3.2 oz 303 lb 3.2 oz 304 lb  Weight (kg) 137.531 kg 137.531 kg 137.893 kg      Telemetry     NSR - Personally Reviewed  ECG    N/A  Physical Exam   GEN: No acute distress.   Neck: No JVD Cardiac: RRR,3/6 systolic murmurs, rubs, or gallops.  Respiratory: Clear to auscultation bilaterally. GI: Soft, nontender, non-distended  MS: No edema; No deformity. Neuro:  Nonfocal  Psych: Normal affect   Labs    High Sensitivity Troponin:   Recent Labs  Lab 08/15/20 1955 08/15/20 2241 08/16/20 1459 08/16/20 1815 08/16/20 2105  TROPONINIHS 402* 330* 148* 164* 176*  175*      Chemistry Recent Labs  Lab 08/15/20 1801 08/16/20 1459 08/19/20 0815 08/20/20 0219 08/21/20 0147  NA 140   < > 142 140 139  K 4.4   < > 4.0 4.1 4.2  CL 100   < > 99 97* 97*  CO2 29   < > 33* 34* 33*  GLUCOSE 120*   < > 154* 177* 174*  BUN 17   < > 20 21 25*  CREATININE 1.38*   < > 1.36* 1.69* 1.65*  CALCIUM 9.2   < > 9.1 9.0 8.8*  PROT 7.7  --   --   --   --  ALBUMIN 3.7  --   --   --   --   AST 17  --   --   --   --   ALT 16  --   --   --   --   ALKPHOS 50  --   --   --   --   BILITOT 0.4  --   --   --   --   GFRNONAA 56*   < > 57* 44* 46*  ANIONGAP 11   < > 10 9 9    < > = values in this interval not displayed.     Hematology Recent Labs  Lab 08/17/20 0351 08/17/20 0351 08/18/20 0320 08/18/20 0320 08/18/20 1327 08/18/20 1331 08/19/20 0219  WBC 13.9*  --  12.3*  --   --   --  8.9  RBC 4.02*  --  3.94*  --   --   --  4.04*  HGB 10.1*   < > 9.8*   < > 11.2* 11.2* 9.9*  HCT 33.9*   < > 33.9*   < > 33.0* 33.0* 34.8*  MCV 84.3  --  86.0  --   --   --  86.1  MCH 25.1*  --  24.9*  --   --   --  24.5*  MCHC 29.8*  --  28.9*  --   --   --  28.4*  RDW 15.3  --  15.3  --   --   --  15.3  PLT 322  --  307  --   --   --  279   < > = values in this interval not displayed.    BNP Recent Labs  Lab 08/15/20 1801 08/16/20 2105  BNP 152.3* 246.2*     DDimer  Recent Labs  Lab 08/16/20 1459  DDIMER 1.02*     Radiology    CT CORONARY MORPH W/CTA COR W/SCORE W/CA W/CM &/OR  WO/CM  Addendum Date: 08/20/2020   ADDENDUM REPORT: 08/20/2020 08:01 EXAM: OVER-READ INTERPRETATION  CT CHEST The following report is an over-read performed by radiologist Dr. 13/01/2020 [facility] on 08/20/2020. This over-read does not include interpretation of cardiac or coronary anatomy or pathology. The coronary calcium score and cardiac CTA interpretation by the cardiologist is attached. COMPARISON:  Chest CTA 08/15/2020. FINDINGS: Extracardiac findings will be described separately under dictation for contemporaneously obtained CTA chest, abdomen and pelvis. IMPRESSION: Please see separate dictation for contemporaneously obtained CTA chest, abdomen and pelvis 08/19/2020 for full description of relevant extracardiac findings. Electronically Signed   By: 13/12/2019 M.D.   On: 08/20/2020 08:01   Result Date: 08/20/2020 MEDICATIONS: MEDICATIONS None EXAM: Cardiac TAVR CT TECHNIQUE: The patient was scanned on a Siemens Force 192 slice scanner. A 120 kV retrospective scan was triggered in the descending thoracic aorta at 111 HU's. Gantry rotation speed was 270 msecs and collimation was .9 mm. No beta blockade or nitro were given. The 3D data set was reconstructed in 5% intervals of the R-R cycle. Systolic and diastolic phases were analyzed on a dedicated work station using MPR, MIP and VRT modes. The patient received 80 cc of contrast. FINDINGS: Aortic Valve: Calcium score 1645 Tri leaflet with restricted motion Aorta: No aneurysm moderate calcific atherosclerosis normal arch vessels Sino-tubular Junction: 25 mm Ascending Thoracic Aorta: 33 mm Aortic Arch: 25 mm Descending Thoracic Aorta: 25 mm Sinus of Valsalva Measurements: Non-coronary: 30.8 mm Right - coronary: 28.2 mm Left -  coronary: 31.3 mm Coronary Artery Height above Annulus: Left Main: 11.1 mm above annulus Right Coronary: 15.3 mm above annulus Virtual Basal Annulus Measurements: Maximum/Minimum Diameter: 27.4 mm x 22.8 mm  Perimeter: 82 mm Area: 505 mm2 Coronary Arteries: Sufficient height above annulus for deployment Optimum Fluoroscopic Angle for Delivery: LAO 3 Caudal 6 degrees IMPRESSION: 1. Tri-leaflet AV with calcium score 1645 and annular area of 505 mm2 suitable for a 26 mm Sapien 3 valve 2.  Coronary arteries sufficient height above annulus for deployment 3.  Optimum angiographic angle for deployment LAO 3 Caudal 6 degrees 4. Some nodular calcification in annulus at base of left coronary cusp 5.  Normal aortic root 3.3 cm Charlton Haws Electronically Signed: By: Charlton Haws M.D. On: 08/19/2020 18:00   CT ANGIO CHEST AORTA W/CM & OR WO/CM  Result Date: 08/20/2020 CLINICAL DATA:  64 year old male with history of severe aortic stenosis. Preprocedural study prior to potential transcatheter aortic valve replacement (TAVR) procedure. EXAM: CT ANGIOGRAPHY CHEST, ABDOMEN AND PELVIS TECHNIQUE: Non-contrast CT of the chest was initially obtained. Multidetector CT imaging through the chest, abdomen and pelvis was performed using the standard protocol during bolus administration of intravenous contrast. Multiplanar reconstructed images and MIPs were obtained and reviewed to evaluate the vascular anatomy. CONTRAST:  OMNIPAQUE IOHEXOL 350 MG/ML SOLN COMPARISON:  None. FINDINGS: CTA CHEST FINDINGS Cardiovascular: Heart size is mildly enlarged. There is no significant pericardial fluid, thickening or pericardial calcification. There is aortic atherosclerosis, as well as atherosclerosis of the great vessels of the mediastinum and the coronary arteries, including calcified atherosclerotic plaque in the left anterior descending, left circumflex and right coronary arteries. Severe thickening calcification of the aortic valve. Mild calcifications of the mitral annulus. Mediastinum/Nodes: No pathologically enlarged mediastinal or hilar lymph nodes. Esophagus is unremarkable in appearance. No axillary lymphadenopathy. Lungs/Pleura:  Patchy areas of ground-glass attenuation and mild interlobular septal thickening noted throughout the lungs bilaterally, favored to reflect a background of mild interstitial pulmonary edema. No confluent consolidative airspace disease. Moderate right and small left pleural effusions lying dependently with some associated passive subsegmental atelectasis in the lower lobes of the lungs bilaterally. Mild scarring in the medial aspect of the right upper lobe and inferior segment of the lingula. Musculoskeletal: There are no aggressive appearing lytic or blastic lesions noted in the visualized portions of the skeleton. CTA ABDOMEN AND PELVIS FINDINGS Hepatobiliary: Diffuse low attenuation throughout the hepatic parenchyma, indicative of a background of hepatic steatosis. In the inferior aspect of segment 5 of the liver (axial image 134 of series 4) there is a small hypovascular lesion measuring 1.3 cm in diameter (44 HU), which is indeterminate. No other larger more suspicious appearing hepatic lesions. No intra or extrahepatic biliary ductal dilatation. Gallbladder is normal in appearance. Pancreas: No pancreatic mass. No pancreatic ductal dilatation. No pancreatic or peripancreatic fluid collections or inflammatory changes. Spleen: Unremarkable. Adrenals/Urinary Tract: Subcentimeter low-attenuation lesions in the right kidney, too small to characterize, but statistically likely to represent cysts. Left kidney and bilateral adrenal glands are normal in appearance. No hydroureteronephrosis. Urinary bladder is normal in appearance. Stomach/Bowel: The appearance of the stomach is normal. No pathologic dilatation of small bowel or colon. Normal appendix. Vascular/Lymphatic: Aortic atherosclerosis, with vascular findings and measurements pertinent to potential TAVR procedure, as detailed below. No aneurysm or dissection noted in the abdominal or pelvic vasculature. No lymphadenopathy noted in the abdomen or pelvis.  Reproductive: Prostate gland and seminal vesicles are unremarkable in appearance. Other: Moderate-sized umbilical hernia containing  only omental fat. Left inguinal hernia containing predominantly fat. No significant volume of ascites. No pneumoperitoneum. Musculoskeletal: There are no aggressive appearing lytic or blastic lesions noted in the visualized portions of the skeleton. VASCULAR MEASUREMENTS PERTINENT TO TAVR: AORTA: Minimal Aortic Diameter-13 x 12 mm Severity of Aortic Calcification-moderate to severe RIGHT PELVIS: Right Common Iliac Artery - Minimal Diameter-7.3 x 7.8 mm Tortuosity - mild Calcification-moderate Right External Iliac Artery - Minimal Diameter-8.1 x 4.2 mm Tortuosity - mild Calcification-mild Right Common Femoral Artery - Minimal Diameter-7.9 x 7.6 mm Tortuosity - mild Calcification-mild LEFT PELVIS: Left Common Iliac Artery - Minimal Diameter-7.4 x 7.8 mm Tortuosity - mild Calcification-moderate Left External Iliac Artery - Minimal Diameter-7.7 x 5.8 mm Tortuosity - mild Calcification-none Left Common Femoral Artery - Minimal Diameter-8.2 x 8.2 mm Tortuosity-mild Calcification-minimal Review of the MIP images confirms the above findings. IMPRESSION: 1. Vascular findings and measurements pertinent to potential TAVR procedure, as detailed above. 2. Severe thickening calcification of the aortic valve, compatible with the reported clinical history of severe aortic stenosis. 3. Aortic atherosclerosis, in addition to three vessel coronary artery disease. Please note that although the presence of coronary artery calcium documents the presence of coronary artery disease, the severity of this disease and any potential stenosis cannot be assessed on this non-gated CT examination. Assessment for potential risk factor modification, dietary therapy or pharmacologic therapy may be warranted, if clinically indicated. 4. Cardiomegaly with evidence of interstitial pulmonary edema and bilateral pleural  effusions (right greater than left); imaging findings suggestive of congestive heart failure. 5. Hepatic steatosis. 6. 1.3 cm indeterminate hypovascular lesion in segment 5 of the liver. Correlation with nonemergent abdominal MRI with and without IV gadolinium is recommended in the near future to definitively characterize this lesion and exclude neoplasm. 7. Additional incidental findings, as above. Electronically Signed   By: Trudie Reed M.D.   On: 08/20/2020 08:53   ECHO TEE  Result Date: 08/21/2020    TRANSESOPHOGEAL ECHO REPORT   Patient Name:   SAIFAN RAYFORD Date of Exam: 08/21/2020 Medical Rec #:  [id number]      Height:       71.0 in Accession #:    [id number]     Weight:       303.2 lb Date of Birth:  06/22/1956      BSA:          2.516 m Patient Age:    64 years       BP:           149/75 mmHg Patient Gender: M              HR:           87 bpm. Exam Location:  Inpatient Procedure: Transesophageal Echo, Cardiac Doppler and Color Doppler Indications:     I35.0 Nonrheumatic aortic (valve) stenosis  History:         Patient has prior history of Echocardiogram examinations, most                  recent 08/17/2020. Previous Myocardial Infarction and CAD,                  Aortic Valve Disease; Risk Factors:Sleep Apnea. Severe aortic                  stenosis.  Sonographer:     Sheralyn Boatman RDCS Referring Phys:  [id number] Wille Celeste THOMPSON Diagnosing Phys: Olga Millers MD PROCEDURE: After discussion of the risks and  benefits of a TEE, an informed consent was obtained from the patient. The transesophogeal probe was passed without difficulty through the esophogus of the patient. Imaged were obtained with the patient in a left lateral decubitus position. Sedation performed by different physician. The patient was monitored while under deep sedation. Anesthestetic sedation was provided intravenously by Anesthesiology:  of Propofol. The patient's vital signs; including heart rate, blood pressure, and oxygen  saturation; remained stable throughout the procedure. The patient developed no complications during the procedure. IMPRESSIONS  1. Severe AS (mean gradient 42 mmHg and peak velocity of 4.2 m/s); mild AI.  2. Left ventricular ejection fraction, by estimation, is 70 to 75%. The left ventricle has hyperdynamic function. There is severe left ventricular hypertrophy.  3. Right ventricular systolic function is normal. The right ventricular size is normal.  4. Left atrial size was moderately dilated. No left atrial/left atrial appendage thrombus was detected.  5. The mitral valve is normal in structure. Trivial mitral valve regurgitation.  6. The aortic valve is tricuspid. Aortic valve regurgitation is mild. Severe aortic valve stenosis.  7. There is Moderate (Grade III) plaque involving the descending aorta. FINDINGS  Left Ventricle: Left ventricular ejection fraction, by estimation, is 70 to 75%. The left ventricle has hyperdynamic function. The left ventricular internal cavity size was normal in size. There is severe left ventricular hypertrophy. Right Ventricle: The right ventricular size is normal. Right vetricular wall thickness was not assessed. Right ventricular systolic function is normal. Left Atrium: Left atrial size was moderately dilated. No left atrial/left atrial appendage thrombus was detected. Right Atrium: Right atrial size was normal in size. Pericardium: There is no evidence of pericardial effusion. Mitral Valve: The mitral valve is normal in structure. Mild mitral annular calcification. Trivial mitral valve regurgitation. MV peak gradient, 5.8 mmHg. The mean mitral valve gradient is 2.0 mmHg. Tricuspid Valve: The tricuspid valve is normal in structure. Tricuspid valve regurgitation is mild. Aortic Valve: The aortic valve is tricuspid. Aortic valve regurgitation is mild. Severe aortic stenosis is present. Aortic valve mean gradient measures 41.5 mmHg. Aortic valve peak gradient measures 66.7 mmHg.  Aortic valve area, by VTI measures 1.04 cm. Pulmonic Valve: The pulmonic valve was normal in structure. Pulmonic valve regurgitation is not visualized. Aorta: The aortic root is normal in size and structure. There is moderate (Grade III) plaque involving the descending aorta. IAS/Shunts: No atrial level shunt detected by color flow Doppler. Additional Comments: Severe AS (mean gradient 42 mmHg and peak velocity of 4.2 m/s); mild AI.  LEFT VENTRICLE PLAX 2D LVOT diam:     2.20 cm LV SV:         81 LV SV Index:   32 LVOT Area:     3.80 cm  AORTIC VALVE AV Area (Vmax):    1.01 cm AV Area (Vmean):   0.92 cm AV Area (VTI):     1.04 cm AV Vmax:           408.50 cm/s AV Vmean:          307.500 cm/s AV VTI:            0.778 m AV Peak Grad:      66.7 mmHg AV Mean Grad:      41.5 mmHg LVOT Vmax:         108.00 cm/s LVOT Vmean:        74.800 cm/s LVOT VTI:          0.214 m LVOT/AV VTI ratio: 0.27  MITRAL VALVE MV Peak grad: 5.8 mmHg  SHUNTS MV Mean grad: 2.0 mmHg  Systemic VTI:  0.21 m MV Vmax:      1.20 m/s  Systemic Diam: 2.20 cm MV Vmean:     60.7 cm/s Olga Millers MD Electronically signed by Olga Millers MD Signature Date/Time: 08/21/2020/9:21:38 AM    Final    VAS US DOPPLER PRE CABG  Result Date: 08/19/2020 PREOPERATIVE VASCULAR EVALUATION  Indications:  Pre-CABG. Risk Factors: Hypertension, Diabetes. Performing Technologist: Jannet Askew RCT RDMS  Examination Guidelines: A complete evaluation includes B-mode imaging, spectral Doppler, color Doppler, and power Doppler as needed of all accessible portions of each vessel. Bilateral testing is considered an integral part of a complete examination. Limited examinations for reoccurring indications may be performed as noted.  Right Carotid Findings: +----------+--------+--------+--------+--------+------------------+           PSV cm/sEDV cm/sStenosisDescribeComments           +----------+--------+--------+--------+--------+------------------+ CCA Prox   97      20                                         +----------+--------+--------+--------+--------+------------------+ CCA Distal104     27                      intimal thickening +----------+--------+--------+--------+--------+------------------+ ICA Prox  118     36      1-39%           intimal thickening +----------+--------+--------+--------+--------+------------------+ ICA Distal132     39                      intimal thickening +----------+--------+--------+--------+--------+------------------+ ECA       90      5                                          +----------+--------+--------+--------+--------+------------------+ Portions of this table do not appear on this page. +----------+--------+-------+--------+------------+           PSV cm/sEDV cmsDescribeArm Pressure +----------+--------+-------+--------+------------+ Subclavian188     4                           +----------+--------+-------+--------+------------+ +---------+--------+--+--------+--+---------+ VertebralPSV cm/s93EDV cm/s29Antegrade +---------+--------+--+--------+--+---------+ Left Carotid Findings: +----------+--------+--------+--------+--------+------------------+           PSV cm/sEDV cm/sStenosisDescribeComments           +----------+--------+--------+--------+--------+------------------+ CCA Prox  112     29                                         +----------+--------+--------+--------+--------+------------------+ CCA Distal105     23                      intimal thickening +----------+--------+--------+--------+--------+------------------+ ICA Prox  127     39                      intimal thickening +----------+--------+--------+--------+--------+------------------+ ICA Distal106     41                                         +----------+--------+--------+--------+--------+------------------+  ECA       102     13                                          +----------+--------+--------+--------+--------+------------------+ +----------+--------+--------+--------+------------+ SubclavianPSV cm/sEDV cm/sDescribeArm Pressure +----------+--------+--------+--------+------------+           212     7                            +----------+--------+--------+--------+------------+ +---------+--------+--+--------+--+---------+ VertebralPSV cm/s82EDV cm/s24Antegrade +---------+--------+--+--------+--+---------+  ABI Findings: +--------+------------------+-----+---------+--------+ Right   Rt Pressure (mmHg)IndexWaveform Comment  +--------+------------------+-----+---------+--------+ [id number]                    triphasic         +--------+------------------+-----+---------+--------+ +--------+------------------+-----+---------+-------+ Left    Lt Pressure (mmHg)IndexWaveform Comment +--------+------------------+-----+---------+-------+ Brachial120                    triphasic        +--------+------------------+-----+---------+-------+  Right Doppler Findings: +--------+--------+-----+---------+--------+ Site    PressureIndexDoppler  Comments +--------+--------+-----+---------+--------+ [id number]          triphasic         +--------+--------+-----+---------+--------+ Radial               triphasic         +--------+--------+-----+---------+--------+ Ulnar                triphasic         +--------+--------+-----+---------+--------+  Left Doppler Findings: +--------+--------+-----+---------+--------+ Site    PressureIndexDoppler  Comments +--------+--------+-----+---------+--------+ [id number]          triphasic         +--------+--------+-----+---------+--------+ Radial               triphasic         +--------+--------+-----+---------+--------+ Ulnar                triphasic         +--------+--------+-----+---------+--------+  Summary: Right Carotid: Velocities in the right ICA  are consistent with a 1-39% stenosis.                The ECA appears <50% stenosed. Left Carotid: Velocities in the left ICA are consistent with a 1-39% stenosis.               The ECA appears <50% stenosed. Vertebrals: Bilateral vertebral arteries demonstrate antegrade flow. Right Upper Extremity: Doppler waveforms remain within normal limits with right radial compression. Doppler waveforms remain within normal limits with right ulnar compression. Left Upper Extremity: Doppler waveforms decrease 50% with left radial compression. Doppler waveform obliterate with left ulnar compression.  Electronically signed by Gretta Began MD on 08/19/2020 at 3:05:41 PM.    Final    CT Angio Abd/Pel w/ and/or w/o  Result Date: 08/20/2020 CLINICAL DATA:  64 year old male with history of severe aortic stenosis. Preprocedural study prior to potential transcatheter aortic valve replacement (TAVR) procedure. EXAM: CT ANGIOGRAPHY CHEST, ABDOMEN AND PELVIS TECHNIQUE: Non-contrast CT of the chest was initially obtained. Multidetector CT imaging through the chest, abdomen and pelvis was performed using the standard protocol during bolus administration of intravenous contrast. Multiplanar reconstructed images and MIPs were obtained and reviewed to evaluate the vascular anatomy. CONTRAST:  OMNIPAQUE IOHEXOL 350 MG/ML SOLN COMPARISON:  None. FINDINGS: CTA CHEST FINDINGS Cardiovascular: Heart size is mildly enlarged. There  is no significant pericardial fluid, thickening or pericardial calcification. There is aortic atherosclerosis, as well as atherosclerosis of the great vessels of the mediastinum and the coronary arteries, including calcified atherosclerotic plaque in the left anterior descending, left circumflex and right coronary arteries. Severe thickening calcification of the aortic valve. Mild calcifications of the mitral annulus. Mediastinum/Nodes: No pathologically enlarged mediastinal or hilar lymph nodes. Esophagus is  unremarkable in appearance. No axillary lymphadenopathy. Lungs/Pleura: Patchy areas of ground-glass attenuation and mild interlobular septal thickening noted throughout the lungs bilaterally, favored to reflect a background of mild interstitial pulmonary edema. No confluent consolidative airspace disease. Moderate right and small left pleural effusions lying dependently with some associated passive subsegmental atelectasis in the lower lobes of the lungs bilaterally. Mild scarring in the medial aspect of the right upper lobe and inferior segment of the lingula. Musculoskeletal: There are no aggressive appearing lytic or blastic lesions noted in the visualized portions of the skeleton. CTA ABDOMEN AND PELVIS FINDINGS Hepatobiliary: Diffuse low attenuation throughout the hepatic parenchyma, indicative of a background of hepatic steatosis. In the inferior aspect of segment 5 of the liver (axial image 134 of series 4) there is a small hypovascular lesion measuring 1.3 cm in diameter (44 HU), which is indeterminate. No other larger more suspicious appearing hepatic lesions. No intra or extrahepatic biliary ductal dilatation. Gallbladder is normal in appearance. Pancreas: No pancreatic mass. No pancreatic ductal dilatation. No pancreatic or peripancreatic fluid collections or inflammatory changes. Spleen: Unremarkable. Adrenals/Urinary Tract: Subcentimeter low-attenuation lesions in the right kidney, too small to characterize, but statistically likely to represent cysts. Left kidney and bilateral adrenal glands are normal in appearance. No hydroureteronephrosis. Urinary bladder is normal in appearance. Stomach/Bowel: The appearance of the stomach is normal. No pathologic dilatation of small bowel or colon. Normal appendix. Vascular/Lymphatic: Aortic atherosclerosis, with vascular findings and measurements pertinent to potential TAVR procedure, as detailed below. No aneurysm or dissection noted in the abdominal or pelvic  vasculature. No lymphadenopathy noted in the abdomen or pelvis. Reproductive: Prostate gland and seminal vesicles are unremarkable in appearance. Other: Moderate-sized umbilical hernia containing only omental fat. Left inguinal hernia containing predominantly fat. No significant volume of ascites. No pneumoperitoneum. Musculoskeletal: There are no aggressive appearing lytic or blastic lesions noted in the visualized portions of the skeleton. VASCULAR MEASUREMENTS PERTINENT TO TAVR: AORTA: Minimal Aortic Diameter-13 x 12 mm Severity of Aortic Calcification-moderate to severe RIGHT PELVIS: Right Common Iliac Artery - Minimal Diameter-7.3 x 7.8 mm Tortuosity - mild Calcification-moderate Right External Iliac Artery - Minimal Diameter-8.1 x 4.2 mm Tortuosity - mild Calcification-mild Right Common Femoral Artery - Minimal Diameter-7.9 x 7.6 mm Tortuosity - mild Calcification-mild LEFT PELVIS: Left Common Iliac Artery - Minimal Diameter-7.4 x 7.8 mm Tortuosity - mild Calcification-moderate Left External Iliac Artery - Minimal Diameter-7.7 x 5.8 mm Tortuosity - mild Calcification-none Left Common Femoral Artery - Minimal Diameter-8.2 x 8.2 mm Tortuosity-mild Calcification-minimal Review of the MIP images confirms the above findings. IMPRESSION: 1. Vascular findings and measurements pertinent to potential TAVR procedure, as detailed above. 2. Severe thickening calcification of the aortic valve, compatible with the reported clinical history of severe aortic stenosis. 3. Aortic atherosclerosis, in addition to three vessel coronary artery disease. Please note that although the presence of coronary artery calcium documents the presence of coronary artery disease, the severity of this disease and any potential stenosis cannot be assessed on this non-gated CT examination. Assessment for potential risk factor modification, dietary therapy or pharmacologic therapy may be warranted, if clinically  indicated. 4. Cardiomegaly with  evidence of interstitial pulmonary edema and bilateral pleural effusions (right greater than left); imaging findings suggestive of congestive heart failure. 5. Hepatic steatosis. 6. 1.3 cm indeterminate hypovascular lesion in segment 5 of the liver. Correlation with nonemergent abdominal MRI with and without IV gadolinium is recommended in the near future to definitively characterize this lesion and exclude neoplasm. 7. Additional incidental findings, as above. Electronically Signed   By: Trudie Reed M.D.   On: 08/20/2020 08:53    Cardiac Studies   TEE 08/21/2020 IMPRESSIONS    1. Severe AS (mean gradient 42 mmHg and peak velocity of 4.2 m/s); mild  AI.  2. Left ventricular ejection fraction, by estimation, is 70 to 75%. The  left ventricle has hyperdynamic function. There is severe left ventricular  hypertrophy.  3. Right ventricular systolic function is normal. The right ventricular  size is normal.  4. Left atrial size was moderately dilated. No left atrial/left atrial  appendage thrombus was detected.  5. The mitral valve is normal in structure. Trivial mitral valve  regurgitation.  6. The aortic valve is tricuspid. Aortic valve regurgitation is mild.  Severe aortic valve stenosis.  7. There is Moderate (Grade III) plaque involving the descending aorta.    RIGHT/LEFT HEART CATH AND CORONARY ANGIOGRAPHY  09/17/2020  Conclusion   Widely patent coronary arteries  High cardiac output: 9.06 L/min by Fick and 9.23 L/min by thermal dilution. Etiology uncertain. Suppressed TSH with normal T4 raising question of T3 thyrotoxicosis versus other mechanism.  Severe pulmonary hypertension, mean pressure 48 mmHg, based upon hemodynamics WHO group 2  Pulmonary capillary wedge pressure mean 34 mmHg, pulmonary vascular resistance 1.55 Woods units.  Mean right atrial pressure 19 mmHg  Aortic valve gradient peak to peak 33 mmHg with mean gradient 22 mmHg.  Aortic valve area 1.94  cm by Fick cardiac output and 1.98 cm by thermodilution  LV function normal by echocardiography with moderate LVH  RECOMMENDATIONS:   Further work-up/management per treating team.  Needs further diuresis.  T3 level  Echo 08/17/2020 1. Left ventricular ejection fraction, by estimation, is 60 to 65%. The  left ventricle has normal function. The left ventricle has no regional  wall motion abnormalities. There is moderate concentric left ventricular  hypertrophy. Left ventricular  diastolic parameters are indeterminate.  2. Right ventricular systolic function is normal. The right ventricular  size is normal.  3. Left atrial size was severely dilated.  4. The mitral valve is normal in structure. Trivial mitral valve  regurgitation. The posterior leaflet appears to have limited mobility with  mild flow acceleration noted across the mitral valve with mean gradient  at HR 101. There is mild narrowing  of the mitral valve orfice without significant stenosis.  5. The aortic valve is tricuspid. There is severe calcifcation of the  aortic valve. There is severe thickening of the aortic valve. Aortic valve  regurgitation is not visualized. Severe aortic valve stenosis with AVA 0.9  (by continuity), mean gradient  , peak gradient , Vmax 4.65cm2, DOI 0.2.  6. The inferior vena cava is dilated in size with >50% respiratory  variability, suggesting right atrial pressure of 8 mmHg.    Patient Profile     64 y.o. male with Diabetes with HTN, HLD,Morbid Obesity and OSAon CPAP, and CKD Stage IIIwho presented for troponin elevation 08/16/20 who found to have severe AS.   Assessment & Plan    1. Aortic stenosis/Mitral Stenosis with pulmonary hypertension -  Echo and cath findings as above - Underwent TEE this morning showing severe AS (mean gradient 42 mmHg and peak velocity of 4.2 m/s); mild AI. No mitral stenosis.    2.  Acute diastolic CHF - Did not received  lasix yesterday 2nd worsen renal function. No dyspnea or orthopnea. Appears euvolemic.  - Symptoms likely due to valvular heart disease - Will review meds and diuretics with MD if plan to discharge and outpatient work of aortic stenosis.  - Diuresed well. Net I & O negative 5.6L. weight down 320>>303lb today.   3. Elevated troponin  - HS-troponin peaked at 402 - mild non obstructive plaque by cath  - Continue ASA, Statin and BB  4. HTN - BP stable on current medications  5. Acute on CKD III - Seems baseline 1.3-1.4 since admit however Scr bumped to 1.69 yesterday after dye from CTs>>> held lasix>> Scr 1.65 today  For questions or updates, please contact [facility] Please consult [email or web address] for contact info under        SignedManson Passey, PA  08/21/2020, 10:55 AM   Personally seen and examined. Agree with APP above with the following comments: See separate note.

## 2020-08-21 NOTE — Transfer of Care (Signed)
Immediate Anesthesia Transfer of Care Note  Patient: Lawrence Rose  Procedure(s) Performed: TRANSESOPHAGEAL ECHOCARDIOGRAM (TEE) (N/A )  Patient Location: Endoscopy Unit  Anesthesia Type:MAC  Level of Consciousness: awake and alert   Airway & Oxygen Therapy: Patient Spontanous Breathing and Patient connected to face mask oxygen  Post-op Assessment: Report given to RN and Post -op Vital signs reviewed and stable  Post vital signs: Reviewed and stable  Last Vitals:  Vitals Value Taken Time  BP    Temp    Pulse 80 08/21/20 0857  Resp 14 08/21/20 0857  SpO2 99 % 08/21/20 0857  Vitals shown include unvalidated device data.  Last Pain:  Vitals:   08/21/20 0856  TempSrc: (P) Temporal  PainSc:       Patients Stated Pain Goal: 4 (41/93/79 0240)  Complications: No complications documented.

## 2020-08-21 NOTE — Progress Notes (Signed)
Physical Therapy Treatment Patient Details Name: Lawrence Rose MRN: [id number] DOB: Jan 01, 1956 Today's Date: 08/21/2020    History of Present Illness Lawrence Rose is an 64 y.o. male past medical history significant for obesity, essential hypertension, diabetes mellitus type 2, lumbar surgery,,  presents to med [facility] with shortness of breath for 1 week.  As per patient his Lasix dose was reduced about a month earlier.  CT was concerning for pneumonia, he also had elevated troponins.  He was placed IV heparin Rocephin and azithromycin and transferred to [facility].  The echo done on August 17, 2020 shows severe aortic stenosis with severe LVH.  Cardiac Cath completed 11/2, findings including pulm HTN and edema.    PT Comments    08/21/2020 PT TAVR Pre-Assessment   6 Minute Walk Test:   Total Distance Walked:263 ft.    Did the pt need a rest break? Yes If yes, why? Pain:No; Fatigue:No; Dyspnea/O2 saturations: No Comments: Rest break due to bilateral knee arthritis and knees give out if too tired.    Pre-Test Post-Test  BP 123/57 147/72  HR 81 100  O2 saturations (indicated RA or L/min [place]) 87 RA 86 RA  Modified Borg Dyspnea Scale (0 none-10 maximal) 0 2  RPE (6 very light-10 very hard) 6 12  Comments: Pt's amb is limited by bilateral knee arthritis not dyspnea.   5 Meter Walk Test:  Trial 1 12.83 seconds  Trial 2 14.12 seconds  Trial 3 12.62 seconds  3 Trial Average/Gait Speed 13.19 seconds/1.24 ft/sec (<1.8 ft/sec indicates high fall risk)  Comments:   Clinical Frailty Scale (1 very fit - 9 terminally ill): 6 (</= 5/12 is considered frail)   Pt's mobility limitations are primarily due to severe bilateral knee arthritis. Mild SOB but pt has to stop amb due to knees.                Follow Up Recommendations  No PT follow up;Supervision - Intermittent     Equipment Recommendations  3in1 (PT)    Recommendations for Other Services        Precautions / Restrictions Precautions Precautions: Fall    Mobility  Bed Mobility Overal bed mobility: Modified Independent                Transfers Overall transfer level: Modified independent Equipment used: Rolling walker (2 wheeled)             General transfer comment: with effort. Easier with elevated bed  Ambulation/Gait Ambulation/Gait assistance: Min guard Gait Distance (Feet): 300 Feet Assistive device: Rolling walker (2 wheeled) Gait Pattern/deviations: Step-through pattern;Decreased stride length;Trunk flexed;Wide base of support Gait velocity: decr Gait velocity interpretation: <1.31 ft/sec, indicative of household ambulator General Gait Details: Assist for safety. Pt with 3 standing rest breaks to lean over and prop forearms on walker.    Stairs             Wheelchair Mobility    Modified Rankin (Stroke Patients Only)       Balance Overall balance assessment: Needs assistance Sitting-balance support: No upper extremity supported;Feet supported Sitting balance-Leahy Scale: Good     Standing balance support: During functional activity;No upper extremity supported Standing balance-Leahy Scale: Fair                              Cognition Arousal/Alertness: Awake/alert Behavior During Therapy: WFL for tasks assessed/performed Overall Cognitive Status: Within Functional Limits for tasks  assessed                                        Exercises      General Comments        Pertinent Vitals/Pain      Home Living                      Prior Function            PT Goals (current goals can now be found in the care plan section) Acute Rehab PT Goals Patient Stated Goal: to go home Progress towards PT goals: Progressing toward goals    Frequency    Min 3X/week      PT Plan Current plan remains appropriate    Co-evaluation              AM-PAC PT "6 Clicks" Mobility    Outcome Measure  Help needed turning from your back to your side while in a flat bed without using bedrails?: None Help needed moving from lying on your back to sitting on the side of a flat bed without using bedrails?: None Help needed moving to and from a bed to a chair (including a wheelchair)?: None Help needed standing up from a chair using your arms (e.g., wheelchair or bedside chair)?: None Help needed to walk in hospital room?: None Help needed climbing 3-5 steps with a railing? : A Little 6 Click Score: 23    End of Session   Activity Tolerance: Patient tolerated treatment well Patient left: with call bell/phone within reach;in bed;with family/visitor present   PT Visit Diagnosis: Unsteadiness on feet (R26.81);Other abnormalities of gait and mobility (R26.89);Difficulty in walking, not elsewhere classified (R26.2)     Time: 9242-6834 PT Time Calculation (min) (ACUTE ONLY): 36 min  Charges:  $Gait Training: 8-22 mins $Physical Performance Test: 8-22 mins                     [facility] PT Acute Rehabilitation Services Pager [phone] Office [phone]    Angelina Ok [facility] 08/21/2020, 5:42 PM

## 2020-08-21 NOTE — Plan of Care (Signed)
  Problem: Activity: Goal: Risk for activity intolerance will decrease Outcome: Progressing   Problem: Coping: Goal: Level of anxiety will decrease Outcome: Progressing   

## 2020-08-21 NOTE — Progress Notes (Signed)
1414 Came to see pt to walk. Gone for testing. Will continue to follow. Luetta Nutting RN BSN 08/21/2020 2:16 PM

## 2020-08-21 NOTE — Progress Notes (Signed)
[facility] PROGRESS NOTE    Progress Note  Lawrence Rose  EVO:[id number] DOB: Dec 06, 1955 DOA: 08/15/2020 PCP: Pcp, No     Brief Narrative:   Lawrence Rose is an 64 y.o. male past medical history significant for obesity, essential hypertension diabetes mellitus type 2 [facility] with shortness of breath for 1 week.  As per patient his Lasix dose was reduced about a month earlier.  CT was concerning for pneumonia, he also had elevated troponins he was placed IV heparin Rocephin and azithromycin and [facility], the echo done on August 17, 2020 shows severe aortic stenosis with severe LVH  Assessment/Plan:   Acute respiratory failure with hypoxia (HCC) due to severe aortic stenosis leading to pulmonary edema: CT surgery was consulted who recommended a TEE on 08/21/2020.  That showed severe AS with a mean gradient of 42 with an EF of 70%, With a mitral valve calcified with mitral regurgitation and a peak gradient of 5.8 mmHg and a valvular gradient of 2 mmHg Appreciate cardiology assistance.   Awaiting CT surgery and cardiology recommendations. Restrict fluid intake.  Acute kidney injury on chronic kidney disease stage IIIa: With a baseline creatinine of 1.3, creatinine trended up his Lasix were held, now is slowly coming back down. Can probably start Lasix on 04/21/2020.  Elevated troponins: Currently on aspirin, heparin, statin and ACE inhibitor.  Chronic kidney disease stage III: Creatinine seems to be at baseline basic metabolic panel is pending today.  Low TSH: We will need to check free T4 and TSH in 6 weeks question sick euthyroid syndrome.  Chronic lower extremity edema: Has resolved.  Controlled diabetes mellitus type 2: Metformin was discontinued not on insulin at home, his A1c is 5.5 we will need to monitor as an outpatient.  Normocytic anemia/anemia of chronic disease: Noted.  Essential hypertension: Blood pressure is  fairly controlled currently on Cardizem clonidine metoprolol and lisinopril for now. Continue to hold Lasix as his creatinine is slowly improving.  Obstructive sleep apnea: CPAP at bedtime.  Chronic pain: Continue Norco and Neurontin.    Obesity, Class III, BMI 40-49.9 (morbid obesity) (HCC) Estimated body mass index is 42.29 kg/m as calculated from the following:   Height as of this encounter: 5\' 11"  (1.803 m).   Weight as of this encounter: 137.5 kg.   DVT prophylaxis: lovenox Family Communication:none Status is: Inpatient  Remains inpatient appropriate because:Hemodynamically unstable   Dispo: The patient is from: Home              Anticipated d/c is to: Home              Anticipated d/c date is: 2 days              Patient currently is not medically stable to d/c.     Code Status:     Code Status Orders  (From admission, onward)         Start     Ordered   08/16/20 1440  Full code  Continuous        08/16/20 1443        Code Status History    This patient has a current code status but no historical code status.   Advance Care Planning Activity        IV Access:    Peripheral IV   Procedures and diagnostic studies:   CT CORONARY MORPH W/CTA COR W/SCORE W/CA W/CM &/OR WO/CM  Addendum Date: 08/20/2020  ADDENDUM REPORT: 08/20/2020 08:01 EXAM: OVER-READ INTERPRETATION  CT CHEST The following report is an over-read performed by radiologist Dr. [facility] on 08/20/2020. This over-read does not include interpretation of cardiac or coronary anatomy or pathology. The coronary calcium score and cardiac CTA interpretation by the cardiologist is attached. COMPARISON:  Chest CTA 08/15/2020. FINDINGS: Extracardiac findings will be described separately under dictation for contemporaneously obtained CTA chest, abdomen and pelvis. IMPRESSION: Please see separate dictation for contemporaneously obtained CTA chest, abdomen and pelvis  08/19/2020 for full description of relevant extracardiac findings. Electronically Signed   By: Trudie Reed M.D.   On: 08/20/2020 08:01   Result Date: 08/20/2020 MEDICATIONS: MEDICATIONS None EXAM: Cardiac TAVR CT TECHNIQUE: The patient was scanned on a Siemens Force 192 slice scanner. A 120 kV retrospective scan was triggered in the descending thoracic aorta at 111 HU's. Gantry rotation speed was 270 msecs and collimation was .9 mm. No beta blockade or nitro were given. The 3D data set was reconstructed in 5% intervals of the R-R cycle. Systolic and diastolic phases were analyzed on a dedicated work station using MPR, MIP and VRT modes. The patient received 80 cc of contrast. FINDINGS: Aortic Valve: Calcium score 1645 Tri leaflet with restricted motion Aorta: No aneurysm moderate calcific atherosclerosis normal arch vessels Sino-tubular Junction: 25 mm Ascending Thoracic Aorta: 33 mm Aortic Arch: 25 mm Descending Thoracic Aorta: 25 mm Sinus of Valsalva Measurements: Non-coronary: 30.8 mm Right - coronary: 28.2 mm Left - coronary: 31.3 mm Coronary Artery Height above Annulus: Left Main: 11.1 mm above annulus Right Coronary: 15.3 mm above annulus Virtual Basal Annulus Measurements: Maximum/Minimum Diameter: 27.4 mm x 22.8 mm Perimeter: 82 mm Area: 505 mm2 Coronary Arteries: Sufficient height above annulus for deployment Optimum Fluoroscopic Angle for Delivery: LAO 3 Caudal 6 degrees IMPRESSION: 1. Tri-leaflet AV with calcium score 1645 and annular area of 505 mm2 suitable for a 26 mm Sapien 3 valve 2.  Coronary arteries sufficient height above annulus for deployment 3.  Optimum angiographic angle for deployment LAO 3 Caudal 6 degrees 4. Some nodular calcification in annulus at base of left coronary cusp 5.  Normal aortic root 3.3 cm Charlton Haws Electronically Signed: By: Charlton Haws M.D. On: 08/19/2020 18:00   CT ANGIO CHEST AORTA W/CM & OR WO/CM  Result Date: 08/20/2020 CLINICAL DATA:  64 year old male  with history of severe aortic stenosis. Preprocedural study prior to potential transcatheter aortic valve replacement (TAVR) procedure. EXAM: CT ANGIOGRAPHY CHEST, ABDOMEN AND PELVIS TECHNIQUE: Non-contrast CT of the chest was initially obtained. Multidetector CT imaging through the chest, abdomen and pelvis was performed using the standard protocol during bolus administration of intravenous contrast. Multiplanar reconstructed images and MIPs were obtained and reviewed to evaluate the vascular anatomy. CONTRAST:  OMNIPAQUE IOHEXOL 350 MG/ML SOLN COMPARISON:  None. FINDINGS: CTA CHEST FINDINGS Cardiovascular: Heart size is mildly enlarged. There is no significant pericardial fluid, thickening or pericardial calcification. There is aortic atherosclerosis, as well as atherosclerosis of the great vessels of the mediastinum and the coronary arteries, including calcified atherosclerotic plaque in the left anterior descending, left circumflex and right coronary arteries. Severe thickening calcification of the aortic valve. Mild calcifications of the mitral annulus. Mediastinum/Nodes: No pathologically enlarged mediastinal or hilar lymph nodes. Esophagus is unremarkable in appearance. No axillary lymphadenopathy. Lungs/Pleura: Patchy areas of ground-glass attenuation and mild interlobular septal thickening noted throughout the lungs bilaterally, favored to reflect a background of mild interstitial pulmonary edema. No confluent consolidative airspace  disease. Moderate right and small left pleural effusions lying dependently with some associated passive subsegmental atelectasis in the lower lobes of the lungs bilaterally. Mild scarring in the medial aspect of the right upper lobe and inferior segment of the lingula. Musculoskeletal: There are no aggressive appearing lytic or blastic lesions noted in the visualized portions of the skeleton. CTA ABDOMEN AND PELVIS FINDINGS Hepatobiliary: Diffuse low attenuation throughout  the hepatic parenchyma, indicative of a background of hepatic steatosis. In the inferior aspect of segment 5 of the liver (axial image 134 of series 4) there is a small hypovascular lesion measuring 1.3 cm in diameter (44 HU), which is indeterminate. No other larger more suspicious appearing hepatic lesions. No intra or extrahepatic biliary ductal dilatation. Gallbladder is normal in appearance. Pancreas: No pancreatic mass. No pancreatic ductal dilatation. No pancreatic or peripancreatic fluid collections or inflammatory changes. Spleen: Unremarkable. Adrenals/Urinary Tract: Subcentimeter low-attenuation lesions in the right kidney, too small to characterize, but statistically likely to represent cysts. Left kidney and bilateral adrenal glands are normal in appearance. No hydroureteronephrosis. Urinary bladder is normal in appearance. Stomach/Bowel: The appearance of the stomach is normal. No pathologic dilatation of small bowel or colon. Normal appendix. Vascular/Lymphatic: Aortic atherosclerosis, with vascular findings and measurements pertinent to potential TAVR procedure, as detailed below. No aneurysm or dissection noted in the abdominal or pelvic vasculature. No lymphadenopathy noted in the abdomen or pelvis. Reproductive: Prostate gland and seminal vesicles are unremarkable in appearance. Other: Moderate-sized umbilical hernia containing only omental fat. Left inguinal hernia containing predominantly fat. No significant volume of ascites. No pneumoperitoneum. Musculoskeletal: There are no aggressive appearing lytic or blastic lesions noted in the visualized portions of the skeleton. VASCULAR MEASUREMENTS PERTINENT TO TAVR: AORTA: Minimal Aortic Diameter-13 x 12 mm Severity of Aortic Calcification-moderate to severe RIGHT PELVIS: Right Common Iliac Artery - Minimal Diameter-7.3 x 7.8 mm Tortuosity - mild Calcification-moderate Right External Iliac Artery - Minimal Diameter-8.1 x 4.2 mm Tortuosity - mild  Calcification-mild Right Common Femoral Artery - Minimal Diameter-7.9 x 7.6 mm Tortuosity - mild Calcification-mild LEFT PELVIS: Left Common Iliac Artery - Minimal Diameter-7.4 x 7.8 mm Tortuosity - mild Calcification-moderate Left External Iliac Artery - Minimal Diameter-7.7 x 5.8 mm Tortuosity - mild Calcification-none Left Common Femoral Artery - Minimal Diameter-8.2 x 8.2 mm Tortuosity-mild Calcification-minimal Review of the MIP images confirms the above findings. IMPRESSION: 1. Vascular findings and measurements pertinent to potential TAVR procedure, as detailed above. 2. Severe thickening calcification of the aortic valve, compatible with the reported clinical history of severe aortic stenosis. 3. Aortic atherosclerosis, in addition to three vessel coronary artery disease. Please note that although the presence of coronary artery calcium documents the presence of coronary artery disease, the severity of this disease and any potential stenosis cannot be assessed on this non-gated CT examination. Assessment for potential risk factor modification, dietary therapy or pharmacologic therapy may be warranted, if clinically indicated. 4. Cardiomegaly with evidence of interstitial pulmonary edema and bilateral pleural effusions (right greater than left); imaging findings suggestive of congestive heart failure. 5. Hepatic steatosis. 6. 1.3 cm indeterminate hypovascular lesion in segment 5 of the liver. Correlation with nonemergent abdominal MRI with and without IV gadolinium is recommended in the near future to definitively characterize this lesion and exclude neoplasm. 7. Additional incidental findings, as above. Electronically Signed   By: Trudie Reed M.D.   On: 08/20/2020 08:53   ECHO TEE  Result Date: 08/21/2020    TRANSESOPHOGEAL ECHO REPORT   Patient Name:  Lawrence Rose Date of Exam: 08/21/2020 Medical Rec #:  [id number]      Height:       71.0 in Accession #:    [id number]     Weight:       303.2 lb  Date of Birth:  09-17-1956      BSA:          2.516 m Patient Age:    64 years       BP:           149/75 mmHg Patient Gender: M              HR:           87 bpm. Exam Location:  Inpatient Procedure: Transesophageal Echo, Cardiac Doppler and Color Doppler Indications:     I35.0 Nonrheumatic aortic (valve) stenosis  History:         Patient has prior history of Echocardiogram examinations, most                  recent 08/17/2020. Previous Myocardial Infarction and CAD,                  Aortic Valve Disease; Risk Factors:Sleep Apnea. Severe aortic                  stenosis.  Sonographer:     Sheralyn Boatman RDCS Referring Phys:  [id number] Wille Celeste THOMPSON Diagnosing Phys: Olga Millers MD PROCEDURE: After discussion of the risks and benefits of a TEE, an informed consent was obtained from the patient. The transesophogeal probe was passed without difficulty through the esophogus of the patient. Imaged were obtained with the patient in a left lateral decubitus position. Sedation performed by different physician. The patient was monitored while under deep sedation. Anesthestetic sedation was provided intravenously by Anesthesiology: 300mg  of Propofol. The patient's vital signs; including heart rate, blood pressure, and oxygen saturation; remained stable throughout the procedure. The patient developed no complications during the procedure. IMPRESSIONS  1. Severe AS (mean gradient 42 mmHg and peak velocity of 4.2 m/s); mild AI.  2. Left ventricular ejection fraction, by estimation, is 70 to 75%. The left ventricle has hyperdynamic function. There is severe left ventricular hypertrophy.  3. Right ventricular systolic function is normal. The right ventricular size is normal.  4. Left atrial size was moderately dilated. No left atrial/left atrial appendage thrombus was detected.  5. The mitral valve is normal in structure. Trivial mitral valve regurgitation.  6. The aortic valve is tricuspid. Aortic valve regurgitation is mild.  Severe aortic valve stenosis.  7. There is Moderate (Grade III) plaque involving the descending aorta. FINDINGS  Left Ventricle: Left ventricular ejection fraction, by estimation, is 70 to 75%. The left ventricle has hyperdynamic function. The left ventricular internal cavity size was normal in size. There is severe left ventricular hypertrophy. Right Ventricle: The right ventricular size is normal. Right vetricular wall thickness was not assessed. Right ventricular systolic function is normal. Left Atrium: Left atrial size was moderately dilated. No left atrial/left atrial appendage thrombus was detected. Right Atrium: Right atrial size was normal in size. Pericardium: There is no evidence of pericardial effusion. Mitral Valve: The mitral valve is normal in structure. Mild mitral annular calcification. Trivial mitral valve regurgitation. MV peak gradient, 5.8 mmHg. The mean mitral valve gradient is 2.0 mmHg. Tricuspid Valve: The tricuspid valve is normal in structure. Tricuspid valve regurgitation is mild. Aortic Valve: The aortic valve is tricuspid. Aortic valve regurgitation is  mild. Severe aortic stenosis is present. Aortic valve mean gradient measures 41.5 mmHg. Aortic valve peak gradient measures 66.7 mmHg. Aortic valve area, by VTI measures 1.04 cm. Pulmonic Valve: The pulmonic valve was normal in structure. Pulmonic valve regurgitation is not visualized. Aorta: The aortic root is normal in size and structure. There is moderate (Grade III) plaque involving the descending aorta. IAS/Shunts: No atrial level shunt detected by color flow Doppler. Additional Comments: Severe AS (mean gradient 42 mmHg and peak velocity of 4.2 m/s); mild AI.  LEFT VENTRICLE PLAX 2D LVOT diam:     2.20 cm LV SV:         81 LV SV Index:   32 LVOT Area:     3.80 cm  AORTIC VALVE AV Area (Vmax):    1.01 cm AV Area (Vmean):   0.92 cm AV Area (VTI):     1.04 cm AV Vmax:           408.50 cm/s AV Vmean:          307.500 cm/s AV VTI:             0.778 m AV Peak Grad:      66.7 mmHg AV Mean Grad:      41.5 mmHg LVOT Vmax:         108.00 cm/s LVOT Vmean:        74.800 cm/s LVOT VTI:          0.214 m LVOT/AV VTI ratio: 0.27 MITRAL VALVE MV Peak grad: 5.8 mmHg  SHUNTS MV Mean grad: 2.0 mmHg  Systemic VTI:  0.21 m MV Vmax:      1.20 m/s  Systemic Diam: 2.20 cm MV Vmean:     60.7 cm/s Olga Millers MD Electronically signed by Olga Millers MD Signature Date/Time: 08/21/2020/9:21:38 AM    Final    VAS US DOPPLER PRE CABG  Result Date: 08/19/2020 PREOPERATIVE VASCULAR EVALUATION  Indications:  Pre-CABG. Risk Factors: Hypertension, Diabetes. Performing Technologist: Jannet Askew RCT RDMS  Examination Guidelines: A complete evaluation includes B-mode imaging, spectral Doppler, color Doppler, and power Doppler as needed of all accessible portions of each vessel. Bilateral testing is considered an integral part of a complete examination. Limited examinations for reoccurring indications may be performed as noted.  Right Carotid Findings: +----------+--------+--------+--------+--------+------------------+           PSV cm/sEDV cm/sStenosisDescribeComments           +----------+--------+--------+--------+--------+------------------+ CCA Prox  97      20                                         +----------+--------+--------+--------+--------+------------------+ CCA Distal104     27                      intimal thickening +----------+--------+--------+--------+--------+------------------+ ICA Prox  118     36      1-39%           intimal thickening +----------+--------+--------+--------+--------+------------------+ ICA Distal132     39                      intimal thickening +----------+--------+--------+--------+--------+------------------+ ECA       90      5                                          +----------+--------+--------+--------+--------+------------------+  Portions of this table do not appear on this page.  +----------+--------+-------+--------+------------+           PSV cm/sEDV cmsDescribeArm Pressure +----------+--------+-------+--------+------------+ Subclavian188     4                           +----------+--------+-------+--------+------------+ +---------+--------+--+--------+--+---------+ VertebralPSV cm/s93EDV cm/s29Antegrade +---------+--------+--+--------+--+---------+ Left Carotid Findings: +----------+--------+--------+--------+--------+------------------+           PSV cm/sEDV cm/sStenosisDescribeComments           +----------+--------+--------+--------+--------+------------------+ CCA Prox  112     29                                         +----------+--------+--------+--------+--------+------------------+ CCA Distal105     23                      intimal thickening +----------+--------+--------+--------+--------+------------------+ ICA Prox  127     39                      intimal thickening +----------+--------+--------+--------+--------+------------------+ ICA Distal106     41                                         +----------+--------+--------+--------+--------+------------------+ ECA       102     13                                         +----------+--------+--------+--------+--------+------------------+ +----------+--------+--------+--------+------------+ SubclavianPSV cm/sEDV cm/sDescribeArm Pressure +----------+--------+--------+--------+------------+           212     7                            +----------+--------+--------+--------+------------+ +---------+--------+--+--------+--+---------+ VertebralPSV cm/s82EDV cm/s24Antegrade +---------+--------+--+--------+--+---------+  ABI Findings: +--------+------------------+-----+---------+--------+ Right   Rt Pressure (mmHg)IndexWaveform Comment  +--------+------------------+-----+---------+--------+ [id number]                    triphasic          +--------+------------------+-----+---------+--------+ +--------+------------------+-----+---------+-------+ Left    Lt Pressure (mmHg)IndexWaveform Comment +--------+------------------+-----+---------+-------+ Brachial120                    triphasic        +--------+------------------+-----+---------+-------+  Right Doppler Findings: +--------+--------+-----+---------+--------+ Site    PressureIndexDoppler  Comments +--------+--------+-----+---------+--------+ [id number]          triphasic         +--------+--------+-----+---------+--------+ Radial               triphasic         +--------+--------+-----+---------+--------+ Ulnar                triphasic         +--------+--------+-----+---------+--------+  Left Doppler Findings: +--------+--------+-----+---------+--------+ Site    PressureIndexDoppler  Comments +--------+--------+-----+---------+--------+ [id number]          triphasic         +--------+--------+-----+---------+--------+ Radial               triphasic         +--------+--------+-----+---------+--------+ Ulnar  triphasic         +--------+--------+-----+---------+--------+  Summary: Right Carotid: Velocities in the right ICA are consistent with a 1-39% stenosis.                The ECA appears <50% stenosed. Left Carotid: Velocities in the left ICA are consistent with a 1-39% stenosis.               The ECA appears <50% stenosed. Vertebrals: Bilateral vertebral arteries demonstrate antegrade flow. Right Upper Extremity: Doppler waveforms remain within normal limits with right radial compression. Doppler waveforms remain within normal limits with right ulnar compression. Left Upper Extremity: Doppler waveforms decrease 50% with left radial compression. Doppler waveform obliterate with left ulnar compression.  Electronically signed by Gretta Began MD on 08/19/2020 at 3:05:41 PM.    Final    CT Angio Abd/Pel w/ and/or  w/o  Result Date: 08/20/2020 CLINICAL DATA:  64 year old male with history of severe aortic stenosis. Preprocedural study prior to potential transcatheter aortic valve replacement (TAVR) procedure. EXAM: CT ANGIOGRAPHY CHEST, ABDOMEN AND PELVIS TECHNIQUE: Non-contrast CT of the chest was initially obtained. Multidetector CT imaging through the chest, abdomen and pelvis was performed using the standard protocol during bolus administration of intravenous contrast. Multiplanar reconstructed images and MIPs were obtained and reviewed to evaluate the vascular anatomy. CONTRAST:  OMNIPAQUE IOHEXOL 350 MG/ML SOLN COMPARISON:  None. FINDINGS: CTA CHEST FINDINGS Cardiovascular: Heart size is mildly enlarged. There is no significant pericardial fluid, thickening or pericardial calcification. There is aortic atherosclerosis, as well as atherosclerosis of the great vessels of the mediastinum and the coronary arteries, including calcified atherosclerotic plaque in the left anterior descending, left circumflex and right coronary arteries. Severe thickening calcification of the aortic valve. Mild calcifications of the mitral annulus. Mediastinum/Nodes: No pathologically enlarged mediastinal or hilar lymph nodes. Esophagus is unremarkable in appearance. No axillary lymphadenopathy. Lungs/Pleura: Patchy areas of ground-glass attenuation and mild interlobular septal thickening noted throughout the lungs bilaterally, favored to reflect a background of mild interstitial pulmonary edema. No confluent consolidative airspace disease. Moderate right and small left pleural effusions lying dependently with some associated passive subsegmental atelectasis in the lower lobes of the lungs bilaterally. Mild scarring in the medial aspect of the right upper lobe and inferior segment of the lingula. Musculoskeletal: There are no aggressive appearing lytic or blastic lesions noted in the visualized portions of the skeleton. CTA ABDOMEN AND  PELVIS FINDINGS Hepatobiliary: Diffuse low attenuation throughout the hepatic parenchyma, indicative of a background of hepatic steatosis. In the inferior aspect of segment 5 of the liver (axial image 134 of series 4) there is a small hypovascular lesion measuring 1.3 cm in diameter (44 HU), which is indeterminate. No other larger more suspicious appearing hepatic lesions. No intra or extrahepatic biliary ductal dilatation. Gallbladder is normal in appearance. Pancreas: No pancreatic mass. No pancreatic ductal dilatation. No pancreatic or peripancreatic fluid collections or inflammatory changes. Spleen: Unremarkable. Adrenals/Urinary Tract: Subcentimeter low-attenuation lesions in the right kidney, too small to characterize, but statistically likely to represent cysts. Left kidney and bilateral adrenal glands are normal in appearance. No hydroureteronephrosis. Urinary bladder is normal in appearance. Stomach/Bowel: The appearance of the stomach is normal. No pathologic dilatation of small bowel or colon. Normal appendix. Vascular/Lymphatic: Aortic atherosclerosis, with vascular findings and measurements pertinent to potential TAVR procedure, as detailed below. No aneurysm or dissection noted in the abdominal or pelvic vasculature. No lymphadenopathy noted in the abdomen or pelvis. Reproductive: Prostate gland and seminal vesicles  are unremarkable in appearance. Other: Moderate-sized umbilical hernia containing only omental fat. Left inguinal hernia containing predominantly fat. No significant volume of ascites. No pneumoperitoneum. Musculoskeletal: There are no aggressive appearing lytic or blastic lesions noted in the visualized portions of the skeleton. VASCULAR MEASUREMENTS PERTINENT TO TAVR: AORTA: Minimal Aortic Diameter-13 x 12 mm Severity of Aortic Calcification-moderate to severe RIGHT PELVIS: Right Common Iliac Artery - Minimal Diameter-7.3 x 7.8 mm Tortuosity - mild Calcification-moderate Right External  Iliac Artery - Minimal Diameter-8.1 x 4.2 mm Tortuosity - mild Calcification-mild Right Common Femoral Artery - Minimal Diameter-7.9 x 7.6 mm Tortuosity - mild Calcification-mild LEFT PELVIS: Left Common Iliac Artery - Minimal Diameter-7.4 x 7.8 mm Tortuosity - mild Calcification-moderate Left External Iliac Artery - Minimal Diameter-7.7 x 5.8 mm Tortuosity - mild Calcification-none Left Common Femoral Artery - Minimal Diameter-8.2 x 8.2 mm Tortuosity-mild Calcification-minimal Review of the MIP images confirms the above findings. IMPRESSION: 1. Vascular findings and measurements pertinent to potential TAVR procedure, as detailed above. 2. Severe thickening calcification of the aortic valve, compatible with the reported clinical history of severe aortic stenosis. 3. Aortic atherosclerosis, in addition to three vessel coronary artery disease. Please note that although the presence of coronary artery calcium documents the presence of coronary artery disease, the severity of this disease and any potential stenosis cannot be assessed on this non-gated CT examination. Assessment for potential risk factor modification, dietary therapy or pharmacologic therapy may be warranted, if clinically indicated. 4. Cardiomegaly with evidence of interstitial pulmonary edema and bilateral pleural effusions (right greater than left); imaging findings suggestive of congestive heart failure. 5. Hepatic steatosis. 6. 1.3 cm indeterminate hypovascular lesion in segment 5 of the liver. Correlation with nonemergent abdominal MRI with and without IV gadolinium is recommended in the near future to definitively characterize this lesion and exclude neoplasm. 7. Additional incidental findings, as above. Electronically Signed   By: Trudie Reed M.D.   On: 08/20/2020 08:53     Medical Consultants:    None.  Anti-Infectives:   none  Subjective:    Grayton Lobo relates his breathing is about the same.  No new  complaints.  Objective:    Vitals:   08/21/20 0556 08/21/20 0740 08/21/20 0856 08/21/20 0906  BP: 130/73 (!) 149/75 (!) 126/55 130/71  Pulse: 69 81 81 80  Resp: 18 16 19 12   Temp: 97.7 F (36.5 C) (!) 97.5 F (36.4 C) 97.9 F (36.6 C)   TempSrc: Oral Temporal Temporal   SpO2: 100% 90% 100% 93%  Weight: (!) 137.5 kg (!) 137.5 kg    Height:  5\' 11"  (1.803 m)     SpO2: 93 % O2 Flow Rate (L/min): 6 L/min   Intake/Output Summary (Last 24 hours) at 08/21/2020 0957 Last data filed at 08/21/2020 0852 Gross per 24 hour  Intake 1378 ml  Output 1500 ml  Net -122 ml   Filed Weights   08/20/20 0523 08/21/20 0556 08/21/20 0740  Weight: (!) 137.9 kg (!) 137.5 kg (!) 137.5 kg    Exam: General exam: In no acute distress. Respiratory system: Good air movement and clear to auscultation. Cardiovascular system: S1 & S2 heard, RRR. No JVD. Gastrointestinal system: Abdomen is nondistended, soft and nontender.  Extremities: No pedal edema. Skin: No rashes, lesions or ulcers  Data Reviewed:    Labs: Basic Metabolic Panel: Recent Labs  Lab 08/16/20 1459 08/16/20 1459 08/17/20 0351 08/17/20 0351 08/18/20 0320 08/18/20 0320 08/18/20 1327 08/18/20 1327 08/18/20 1331 08/18/20 1331 08/19/20 0815 08/19/20  0815 08/20/20 0219 08/21/20 0147  NA 139   < > 139   < > 139   < > 143  --  142  --  142  --  140 139  K 4.2   < > 3.9   < > 4.2   < > 4.0   < > 4.0   < > 4.0   < > 4.1 4.2  CL 95*   < > 99  --  100  --   --   --   --   --  99  --  97* 97*  CO2 29   < > 28  --  30  --   --   --   --   --  33*  --  34* 33*  GLUCOSE 209*   < > 166*  --  158*  --   --   --   --   --  154*  --  177* 174*  BUN 22   < > 23  --  23  --   --   --   --   --  20  --  21 25*  CREATININE 1.41*   < > 1.43*  --  1.43*  --   --   --   --   --  1.36*  --  1.69* 1.65*  CALCIUM 9.5   < > 8.9  --  8.7*  --   --   --   --   --  9.1  --  9.0 8.8*  MG 2.0  --   --   --   --   --   --   --   --   --   --   --   --    --    < > = values in this interval not displayed.   GFR Estimated Creatinine Clearance: 62.4 mL/min (A) (by C-G formula based on SCr of 1.65 mg/dL (H)). Liver Function Tests: Recent Labs  Lab 08/15/20 1801  AST 17  ALT 16  ALKPHOS 50  BILITOT 0.4  PROT 7.7  ALBUMIN 3.7   No results for input(s): LIPASE, AMYLASE in the last 168 hours. No results for input(s): AMMONIA in the last 168 hours. Coagulation profile No results for input(s): INR, PROTIME in the last 168 hours. COVID-19 Labs  No results for input(s): DDIMER, FERRITIN, LDH, CRP in the last 72 hours.  Lab Results  Component Value Date   SARSCOV2NAA NEGATIVE 08/15/2020    CBC: Recent Labs  Lab 08/15/20 1801 08/15/20 1801 08/16/20 1459 08/16/20 1459 08/17/20 0351 08/18/20 0320 08/18/20 1327 08/18/20 1331 08/19/20 0219  WBC 8.4  --  10.8*  --  13.9* 12.3*  --   --  8.9  NEUTROABS 6.2  --   --   --   --   --   --   --   --   HGB 10.1*   < > 10.6*   < > 10.1* 9.8* 11.2* 11.2* 9.9*  HCT 34.0*   < > 36.1*   < > 33.9* 33.9* 33.0* 33.0* 34.8*  MCV 84.4  --  83.8  --  84.3 86.0  --   --  86.1  PLT 306  --  340  --  322 307  --   --  279   < > = values in this interval not displayed.   Cardiac Enzymes: No results for input(s): CKTOTAL, CKMB, CKMBINDEX, TROPONINI in  the last 168 hours. BNP (last 3 results) No results for input(s): PROBNP in the last 8760 hours. CBG: Recent Labs  Lab 08/20/20 0553 08/20/20 1119 08/20/20 1636 08/20/20 2047 08/21/20 0650  GLUCAP 147* 202* 166* 145* 156*   D-Dimer: No results for input(s): DDIMER in the last 72 hours. Hgb A1c: No results for input(s): HGBA1C in the last 72 hours. Lipid Profile: No results for input(s): CHOL, HDL, LDLCALC, TRIG, CHOLHDL, LDLDIRECT in the last 72 hours. Thyroid function studies: Recent Labs    08/19/20 0219  T3FREE 2.3   Anemia work up: No results for input(s): VITAMINB12, FOLATE, FERRITIN, TIBC, IRON, RETICCTPCT in the last 72  hours. Sepsis Labs: Recent Labs  Lab 08/16/20 1459 08/17/20 0351 08/18/20 0320 08/19/20 0219  WBC 10.8* 13.9* 12.3* 8.9   Microbiology Recent Results (from the past 240 hour(s))  Respiratory Panel by RT PCR (Flu A&B, Covid) - Nasopharyngeal Swab     Status: None   Collection Time: 08/15/20  5:19 PM   Specimen: Nasopharyngeal Swab  Result Value Ref Range Status   SARS Coronavirus 2 by RT PCR NEGATIVE NEGATIVE Final    Comment: (NOTE) SARS-CoV-2 target nucleic acids are NOT DETECTED.  The SARS-CoV-2 RNA is generally detectable in upper respiratoy specimens during the acute phase of infection. The lowest concentration of SARS-CoV-2 viral copies this assay can detect is 131 copies/mL. A negative result does not preclude SARS-Cov-2 infection and should not be used as the sole basis for treatment or other patient management decisions. A negative result may occur with  improper specimen collection/handling, submission of specimen other than nasopharyngeal swab, presence of viral mutation(s) within the areas targeted by this assay, and inadequate number of viral copies (<131 copies/mL). A negative result must be combined with clinical observations, patient history, and epidemiological information. The expected result is Negative.  Fact Sheet for Patients:  https://www.moore.com/  Fact Sheet for Healthcare Providers:  https://www.young.biz/  This test is no t yet approved or cleared by the [place] FDA and  has been authorized for detection and/or diagnosis of SARS-CoV-2 by FDA under an Emergency Use Authorization (EUA). This EUA will remain  in effect (meaning this test can be used) for the duration of the COVID-19 declaration under Section 564(b)(1) of the Act, 21 U.S.C. section 360bbb-3(b)(1), unless the authorization is terminated or revoked sooner.     Influenza A by PCR NEGATIVE NEGATIVE Final   Influenza B by PCR NEGATIVE  NEGATIVE Final    Comment: (NOTE) The Xpert Xpress SARS-CoV-2/FLU/RSV assay is intended as an aid in  the diagnosis of influenza from Nasopharyngeal swab specimens and  should not be used as a sole basis for treatment. Nasal washings and  aspirates are unacceptable for Xpert Xpress SARS-CoV-2/FLU/RSV  testing.  Fact Sheet for Patients: https://www.moore.com/  Fact Sheet for Healthcare Providers: https://www.young.biz/  This test is not yet approved or cleared by the [place] FDA and  has been authorized for detection and/or diagnosis of SARS-CoV-2 by  FDA under an Emergency Use Authorization (EUA). This EUA will remain  in effect (meaning this test can be used) for the duration of the  Covid-19 declaration under Section 564(b)(1) of the Act, 21  U.S.C. section 360bbb-3(b)(1), unless the authorization is  terminated or revoked. Performed at [facility], [address] [place], [place] [address]   Culture, blood (routine x 2) Call MD if unable to obtain prior to antibiotics being given     Status: None  Collection Time: 08/16/20  9:04 PM   Specimen: BLOOD  Result Value Ref Range Status   Specimen Description BLOOD RIGHT ARM  Final   Special Requests   Final    BOTTLES DRAWN AEROBIC AND ANAEROBIC Blood Culture results may not be optimal due to an excessive volume of blood received in culture bottles   Culture   Final    NO GROWTH 5 DAYS Performed at [facility], [address] [place], [place] [address]    Report Status 08/21/2020 FINAL  Final  Culture, blood (routine x 2) Call MD if unable to obtain prior to antibiotics being given     Status: None   Collection Time: 08/16/20  9:05 PM   Specimen: BLOOD RIGHT HAND  Result Value Ref Range Status   Specimen Description BLOOD RIGHT HAND  Final   Special Requests   Final    BOTTLES DRAWN AEROBIC ONLY Blood Culture adequate volume   Culture   Final    NO GROWTH 5  DAYS Performed at [facility], [address] [place], [place] [address]    Report Status 08/21/2020 FINAL  Final     Medications:   . aspirin EC  81 mg Oral Daily  . chlorhexidine  15 mL Mouth Rinse BID  . cholecalciferol  1,000 Units Oral QHS  . cloNIDine  0.1 mg Oral BID  . diltiazem  240 mg Oral Daily  . folic acid  1 mg Oral Daily  . gabapentin  600 mg Oral BID  . heparin  5,000 Units Subcutaneous Q8H  . insulin aspart  0-9 Units Subcutaneous TID WC  . latanoprost  1 drop Both Eyes QHS  . lisinopril  40 mg Oral QHS  . mouth rinse  15 mL Mouth Rinse q12n4p  . metoprolol tartrate  25 mg Oral BID  . multivitamin with minerals  1 tablet Oral QHS  . oxyCODONE  10 mg Oral Q12H  . pantoprazole  40 mg Oral BID  . rosuvastatin  20 mg Oral Daily  . sodium chloride flush  3 mL Intravenous Q12H   Continuous Infusions: . sodium chloride 140 mL/hr at 08/18/20 0918  . sodium chloride    . lactated ringers Stopped (08/21/20 0858)      LOS: 5 days   Marinda Elk  [facility]  08/21/2020, 9:57 AM

## 2020-08-21 NOTE — Progress Notes (Signed)
    Transesophageal Echocardiogram Note  Lawrence Rose [id number] April 15, 1956  Procedure: Transesophageal Echocardiogram Indications: AS/MS  Procedure Details Consent: Obtained Time Out: Verified patient identification, verified procedure, site/side was marked, verified correct patient position, special equipment/implants available, Radiology Safety Procedures followed,  medications/allergies/relevent history reviewed, required imaging and test results available.  Performed  Medications:  Pt sedated by anesthesia with diprovan 158 mg IV total.  Normal LV function; severe LVH; moderate LAE; trileaflet aortic valve with severe AS (peak velocity 4.2 m/s; mean gradient 42 mmHg); mild AI; no MS; trace MR; mild TR.  Complications: No apparent complications Patient did tolerate procedure well.  Olga Millers, MD

## 2020-08-21 NOTE — Anesthesia Preprocedure Evaluation (Signed)
Anesthesia Evaluation  Patient identified by MRN, date of birth, ID band Patient awake    Reviewed: Allergy & Precautions, H&P , NPO status , Patient's Chart, lab work & pertinent test results  Airway Mallampati: II   Neck ROM: full    Dental   Pulmonary neg pulmonary ROS, sleep apnea ,    breath sounds clear to auscultation       Cardiovascular hypertension, + Past MI  + Valvular Problems/Murmurs AS  Rhythm:regular Rate:Normal     Neuro/Psych    GI/Hepatic   Endo/Other  diabetes  Renal/GU Renal InsufficiencyRenal disease     Musculoskeletal   Abdominal   Peds  Hematology  (+) anemia ,   Anesthesia Other Findings   Reproductive/Obstetrics                             Anesthesia Physical Anesthesia Plan  ASA: III  Anesthesia Plan: General   Post-op Pain Management:    Induction: Intravenous  PONV Risk Score and Plan: 2 and Propofol infusion and Treatment may vary due to age or medical condition  Airway Management Planned: Nasal Cannula  Additional Equipment:   Intra-op Plan:   Post-operative Plan:   Informed Consent: I have reviewed the patients History and Physical, chart, labs and discussed the procedure including the risks, benefits and alternatives for the proposed anesthesia with the patient or authorized representative who has indicated his/her understanding and acceptance.       Plan Discussed with: CRNA, Anesthesiologist and Surgeon  Anesthesia Plan Comments:         Anesthesia Quick Evaluation

## 2020-08-21 NOTE — Progress Notes (Signed)
Progress Note  Patient Name: Lawrence Rose Date of Encounter: 08/21/2020  [facility] Cardiologist: Christell Constant, MD   Subjective   S/p TEE Patient feels fine.  No swallowing issues.  No shortness. of breath  Inpatient Medications    Scheduled Meds:  aspirin EC  81 mg Oral Daily   chlorhexidine  15 mL Mouth Rinse BID   cholecalciferol  1,000 Units Oral QHS   cloNIDine  0.1 mg Oral BID   diltiazem  240 mg Oral Daily   folic acid  1 mg Oral Daily   gabapentin  600 mg Oral BID   heparin  5,000 Units Subcutaneous Q8H   insulin aspart  0-9 Units Subcutaneous TID WC   latanoprost  1 drop Both Eyes QHS   lisinopril  40 mg Oral QHS   mouth rinse  15 mL Mouth Rinse q12n4p   metoprolol tartrate  25 mg Oral BID   multivitamin with minerals  1 tablet Oral QHS   oxyCODONE  10 mg Oral Q12H   pantoprazole  40 mg Oral BID   rosuvastatin  20 mg Oral Daily   sodium chloride flush  3 mL Intravenous Q12H   Continuous Infusions:  sodium chloride 140 mL/hr at 08/18/20 0918   sodium chloride     lactated ringers Stopped (08/21/20 0858)   PRN Meds: sodium chloride, sodium chloride, acetaminophen, HYDROcodone-acetaminophen, nitroGLYCERIN, ondansetron (ZOFRAN) IV, oxyCODONE, sodium chloride flush   Vital Signs    Vitals:   08/21/20 0740 08/21/20 0856 08/21/20 0906 08/21/20 1006  BP: (!) 149/75 (!) 126/55 130/71 (!) 143/80  Pulse: 81 81 80 75  Resp: Temp: (!) 97.5 F (36.4 C) 97.9 F (36.6 C)  99 F (37.2 C)  TempSrc: Temporal Temporal  Oral  SpO2: 90% 100% 93% 97%  Weight: (!) 137.5 kg     Height:  (1.803 m)       Intake/Output Summary (Last 24 hours) at 08/21/2020 1052 Last data filed at 08/21/2020 1006 Gross per 24 hour  Intake 1378 ml  Output 1700 ml  Net -322 ml   Last 3 Weights 08/21/2020 08/21/2020 08/20/2020  Weight (lbs) 303 lb 3.2 oz 303 lb 3.2 oz 304 lb  Weight (kg) 137.531 kg 137.531 kg 137.893 kg      Telemetry    NSR-  Personally Reviewed  ECG    No new tracing   Physical Exam   GEN: No acute distress.   Neck: JVD difficult sitting up; thick neck Cardiac: RRR, + 3/6 systolic murmurs, rubs, or gallops.  Respiratory: course breath sound  GI: Soft, nontender, non-distended  MS: Trace edema; No deformity. Neuro:  Nonfocal  Psych: Normal affect   Labs    High Sensitivity Troponin:   Recent Labs  Lab 08/15/20 1955 08/15/20 2241 08/16/20 1459 08/16/20 1815 08/16/20 2105  TROPONINIHS 402* 330* 148* 164* 176*  175*      Chemistry Recent Labs  Lab 08/15/20 1801 08/16/20 1459 08/19/20 0815 08/20/20 0219 08/21/20 0147  NA 140   < > 142 140 139  K 4.4   < > 4.0 4.1 4.2  CL 100   < > 99 97* 97*  CO2 29   < > 33* 34* 33*  GLUCOSE 120*   < > 154* 177* 174*  BUN 17   < > 20 21 25*  CREATININE 1.38*   < > 1.36* 1.69* 1.65*  CALCIUM 9.2   < > 9.1 9.0 8.8*  PROT 7.7  --   --   --   --   ALBUMIN 3.7  --   --   --   --   AST 17  --   --   --   --   ALT 16  --   --   --   --   ALKPHOS 50  --   --   --   --   BILITOT 0.4  --   --   --   --   GFRNONAA 56*   < > 57* 44* 46*  ANIONGAP 11   < > 10 9 9    < > = values in this interval not displayed.     Hematology Recent Labs  Lab 08/17/20 0351 08/17/20 0351 08/18/20 0320 08/18/20 0320 08/18/20 1327 08/18/20 1331 08/19/20 0219  WBC 13.9*  --  12.3*  --   --   --  8.9  RBC 4.02*  --  3.94*  --   --   --  4.04*  HGB 10.1*   < > 9.8*   < > 11.2* 11.2* 9.9*  HCT 33.9*   < > 33.9*   < > 33.0* 33.0* 34.8*  MCV 84.3  --  86.0  --   --   --  86.1  MCH 25.1*  --  24.9*  --   --   --  24.5*  MCHC 29.8*  --  28.9*  --   --   --  28.4*  RDW 15.3  --  15.3  --   --   --  15.3  PLT 322  --  307  --   --   --  279   < > = values in this interval not displayed.    BNP Recent Labs  Lab 08/15/20 1801 08/16/20 2105  BNP 152.3* 246.2*     DDimer  Recent Labs  Lab 08/16/20 1459  DDIMER 1.02*     Radiology    CT CORONARY MORPH W/CTA COR  W/SCORE W/CA W/CM &/OR WO/CM  Addendum Date: 08/20/2020   ADDENDUM REPORT: 08/20/2020 08:01 EXAM: OVER-READ INTERPRETATION  CT CHEST The following report is an over-read performed by radiologist Dr. Royal Piedra [facility] on 08/20/2020. This over-read does not include interpretation of cardiac or coronary anatomy or pathology. The coronary calcium score and cardiac CTA interpretation by the cardiologist is attached. COMPARISON:  Chest CTA 08/15/2020. FINDINGS: Extracardiac findings will be described separately under dictation for contemporaneously obtained CTA chest, abdomen and pelvis. IMPRESSION: Please see separate dictation for contemporaneously obtained CTA chest, abdomen and pelvis 08/19/2020 for full description of relevant extracardiac findings. Electronically Signed   By: Trudie Reed M.D.   On: 08/20/2020 08:01   Result Date: 08/20/2020 MEDICATIONS: MEDICATIONS None EXAM: Cardiac TAVR CT TECHNIQUE: The patient was scanned on a Siemens Force 192 slice scanner. A 120 kV retrospective scan was triggered in the descending thoracic aorta at 111 HU's. Gantry rotation speed was 270 msecs and collimation was .9 mm. No beta blockade or nitro were given. The 3D data set was reconstructed in 5% intervals of the R-R cycle. Systolic and diastolic phases were analyzed on a dedicated work station using MPR, MIP and VRT modes. The patient received 80 cc of contrast. FINDINGS: Aortic Valve: Calcium score 1645 Tri leaflet with restricted motion Aorta: No aneurysm moderate calcific atherosclerosis normal arch vessels Sino-tubular Junction: 25 mm Ascending Thoracic Aorta: 33 mm Aortic Arch: 25 mm Descending Thoracic Aorta: 25  mm Sinus of Valsalva Measurements: Non-coronary: 30.8 mm Right - coronary: 28.2 mm Left - coronary: 31.3 mm Coronary Artery Height above Annulus: Left Main: 11.1 mm above annulus Right Coronary: 15.3 mm above annulus Virtual Basal Annulus Measurements: Maximum/Minimum Diameter:  27.4 mm x 22.8 mm Perimeter: 82 mm Area: 505 mm2 Coronary Arteries: Sufficient height above annulus for deployment Optimum Fluoroscopic Angle for Delivery: LAO 3 Caudal 6 degrees IMPRESSION: 1. Tri-leaflet AV with calcium score 1645 and annular area of 505 mm2 suitable for a 26 mm Sapien 3 valve 2.  Coronary arteries sufficient height above annulus for deployment 3.  Optimum angiographic angle for deployment LAO 3 Caudal 6 degrees 4. Some nodular calcification in annulus at base of left coronary cusp 5.  Normal aortic root 3.3 cm Charlton Haws Electronically Signed: By: Charlton Haws M.D. On: 08/19/2020 18:00   CT ANGIO CHEST AORTA W/CM & OR WO/CM  Result Date: 08/20/2020 CLINICAL DATA:  64 year old male with history of severe aortic stenosis. Preprocedural study prior to potential transcatheter aortic valve replacement (TAVR) procedure. EXAM: CT ANGIOGRAPHY CHEST, ABDOMEN AND PELVIS TECHNIQUE: Non-contrast CT of the chest was initially obtained. Multidetector CT imaging through the chest, abdomen and pelvis was performed using the standard protocol during bolus administration of intravenous contrast. Multiplanar reconstructed images and MIPs were obtained and reviewed to evaluate the vascular anatomy. CONTRAST:  OMNIPAQUE IOHEXOL 350 MG/ML SOLN COMPARISON:  None. FINDINGS: CTA CHEST FINDINGS Cardiovascular: Heart size is mildly enlarged. There is no significant pericardial fluid, thickening or pericardial calcification. There is aortic atherosclerosis, as well as atherosclerosis of the great vessels of the mediastinum and the coronary arteries, including calcified atherosclerotic plaque in the left anterior descending, left circumflex and right coronary arteries. Severe thickening calcification of the aortic valve. Mild calcifications of the mitral annulus. Mediastinum/Nodes: No pathologically enlarged mediastinal or hilar lymph nodes. Esophagus is unremarkable in appearance. No axillary lymphadenopathy.  Lungs/Pleura: Patchy areas of ground-glass attenuation and mild interlobular septal thickening noted throughout the lungs bilaterally, favored to reflect a background of mild interstitial pulmonary edema. No confluent consolidative airspace disease. Moderate right and small left pleural effusions lying dependently with some associated passive subsegmental atelectasis in the lower lobes of the lungs bilaterally. Mild scarring in the medial aspect of the right upper lobe and inferior segment of the lingula. Musculoskeletal: There are no aggressive appearing lytic or blastic lesions noted in the visualized portions of the skeleton. CTA ABDOMEN AND PELVIS FINDINGS Hepatobiliary: Diffuse low attenuation throughout the hepatic parenchyma, indicative of a background of hepatic steatosis. In the inferior aspect of segment 5 of the liver (axial image 134 of series 4) there is a small hypovascular lesion measuring 1.3 cm in diameter (44 HU), which is indeterminate. No other larger more suspicious appearing hepatic lesions. No intra or extrahepatic biliary ductal dilatation. Gallbladder is normal in appearance. Pancreas: No pancreatic mass. No pancreatic ductal dilatation. No pancreatic or peripancreatic fluid collections or inflammatory changes. Spleen: Unremarkable. Adrenals/Urinary Tract: Subcentimeter low-attenuation lesions in the right kidney, too small to characterize, but statistically likely to represent cysts. Left kidney and bilateral adrenal glands are normal in appearance. No hydroureteronephrosis. Urinary bladder is normal in appearance. Stomach/Bowel: The appearance of the stomach is normal. No pathologic dilatation of small bowel or colon. Normal appendix. Vascular/Lymphatic: Aortic atherosclerosis, with vascular findings and measurements pertinent to potential TAVR procedure, as detailed below. No aneurysm or dissection noted in the abdominal or pelvic vasculature. No lymphadenopathy noted in the abdomen or  pelvis.  Reproductive: Prostate gland and seminal vesicles are unremarkable in appearance. Other: Moderate-sized umbilical hernia containing only omental fat. Left inguinal hernia containing predominantly fat. No significant volume of ascites. No pneumoperitoneum. Musculoskeletal: There are no aggressive appearing lytic or blastic lesions noted in the visualized portions of the skeleton. VASCULAR MEASUREMENTS PERTINENT TO TAVR: AORTA: Minimal Aortic Diameter-13 x 12 mm Severity of Aortic Calcification-moderate to severe RIGHT PELVIS: Right Common Iliac Artery - Minimal Diameter-7.3 x 7.8 mm Tortuosity - mild Calcification-moderate Right External Iliac Artery - Minimal Diameter-8.1 x 4.2 mm Tortuosity - mild Calcification-mild Right Common Femoral Artery - Minimal Diameter-7.9 x 7.6 mm Tortuosity - mild Calcification-mild LEFT PELVIS: Left Common Iliac Artery - Minimal Diameter-7.4 x 7.8 mm Tortuosity - mild Calcification-moderate Left External Iliac Artery - Minimal Diameter-7.7 x 5.8 mm Tortuosity - mild Calcification-none Left Common Femoral Artery - Minimal Diameter-8.2 x 8.2 mm Tortuosity-mild Calcification-minimal Review of the MIP images confirms the above findings. IMPRESSION: 1. Vascular findings and measurements pertinent to potential TAVR procedure, as detailed above. 2. Severe thickening calcification of the aortic valve, compatible with the reported clinical history of severe aortic stenosis. 3. Aortic atherosclerosis, in addition to three vessel coronary artery disease. Please note that although the presence of coronary artery calcium documents the presence of coronary artery disease, the severity of this disease and any potential stenosis cannot be assessed on this non-gated CT examination. Assessment for potential risk factor modification, dietary therapy or pharmacologic therapy may be warranted, if clinically indicated. 4. Cardiomegaly with evidence of interstitial pulmonary edema and bilateral  pleural effusions (right greater than left); imaging findings suggestive of congestive heart failure. 5. Hepatic steatosis. 6. 1.3 cm indeterminate hypovascular lesion in segment 5 of the liver. Correlation with nonemergent abdominal MRI with and without IV gadolinium is recommended in the near future to definitively characterize this lesion and exclude neoplasm. 7. Additional incidental findings, as above. Electronically Signed   By: Trudie Reed M.D.   On: 08/20/2020 08:53   ECHO TEE  Result Date: 08/21/2020    TRANSESOPHOGEAL ECHO REPORT   [name] Date of Exam: 08/21/2020 Medical Rec #:  [id number]      Height:       71.0 in Accession #:    [id number]     Weight:       303.2 lb Date of Birth:  10-02-1956      BSA:          2.516 m Patient Age:    64 years       BP:           149/75 mmHg Patient Gender: M              HR:           87 bpm. Exam Location:  Inpatient Procedure: Transesophageal Echo, Cardiac Doppler and Color Doppler Indications:     I35.0 Nonrheumatic aortic (valve) stenosis  History:         Patient has prior history of Echocardiogram examinations, most                  recent 08/17/2020. Previous Myocardial Infarction and CAD,                  Aortic Valve Disease; Risk Factors:Sleep Apnea. Severe aortic                  stenosis.  Sonographer:     Sheralyn Boatman RDCS Referring Phys:  [id number]  KATHRYN R THOMPSON Diagnosing Phys: Olga Millers MD PROCEDURE: After discussion of the risks and benefits of a TEE, an informed consent was obtained from the patient. The transesophogeal probe was passed without difficulty through the esophogus of the patient. Imaged were obtained with the patient in a left lateral decubitus position. Sedation performed by different physician. The patient was monitored while under deep sedation. Anesthestetic sedation was provided intravenously by Anesthesiology: 300mg  of Propofol. The patient's vital signs; including heart rate, blood pressure, and  oxygen saturation; remained stable throughout the procedure. The patient developed no complications during the procedure. IMPRESSIONS  1. Severe AS (mean gradient 42 mmHg and peak velocity of 4.2 m/s); mild AI.  2. Left ventricular ejection fraction, by estimation, is 70 to 75%. The left ventricle has hyperdynamic function. There is severe left ventricular hypertrophy.  3. Right ventricular systolic function is normal. The right ventricular size is normal.  4. Left atrial size was moderately dilated. No left atrial/left atrial appendage thrombus was detected.  5. The mitral valve is normal in structure. Trivial mitral valve regurgitation.  6. The aortic valve is tricuspid. Aortic valve regurgitation is mild. Severe aortic valve stenosis.  7. There is Moderate (Grade III) plaque involving the descending aorta. FINDINGS  Left Ventricle: Left ventricular ejection fraction, by estimation, is 70 to 75%. The left ventricle has hyperdynamic function. The left ventricular internal cavity size was normal in size. There is severe left ventricular hypertrophy. Right Ventricle: The right ventricular size is normal. Right vetricular wall thickness was not assessed. Right ventricular systolic function is normal. Left Atrium: Left atrial size was moderately dilated. No left atrial/left atrial appendage thrombus was detected. Right Atrium: Right atrial size was normal in size. Pericardium: There is no evidence of pericardial effusion. Mitral Valve: The mitral valve is normal in structure. Mild mitral annular calcification. Trivial mitral valve regurgitation. MV peak gradient, 5.8 mmHg. The mean mitral valve gradient is 2.0 mmHg. Tricuspid Valve: The tricuspid valve is normal in structure. Tricuspid valve regurgitation is mild. Aortic Valve: The aortic valve is tricuspid. Aortic valve regurgitation is mild. Severe aortic stenosis is present. Aortic valve mean gradient measures 41.5 mmHg. Aortic valve peak gradient measures 66.7  mmHg. Aortic valve area, by VTI measures 1.04 cm. Pulmonic Valve: The pulmonic valve was normal in structure. Pulmonic valve regurgitation is not visualized. Aorta: The aortic root is normal in size and structure. There is moderate (Grade III) plaque involving the descending aorta. IAS/Shunts: No atrial level shunt detected by color flow Doppler. Additional Comments: Severe AS (mean gradient 42 mmHg and peak velocity of 4.2 m/s); mild AI.  LEFT VENTRICLE PLAX 2D LVOT diam:     2.20 cm LV SV:         81 LV SV Index:   32 LVOT Area:     3.80 cm  AORTIC VALVE AV Area (Vmax):    1.01 cm AV Area (Vmean):   0.92 cm AV Area (VTI):     1.04 cm AV Vmax:           408.50 cm/s AV Vmean:          307.500 cm/s AV VTI:            0.778 m AV Peak Grad:      66.7 mmHg AV Mean Grad:      41.5 mmHg LVOT Vmax:         108.00 cm/s LVOT Vmean:        74.800 cm/s LVOT VTI:  0.214 m LVOT/AV VTI ratio: 0.27 MITRAL VALVE MV Peak grad: 5.8 mmHg  SHUNTS MV Mean grad: 2.0 mmHg  Systemic VTI:  0.21 m MV Vmax:      1.20 m/s  Systemic Diam: 2.20 cm MV Vmean:     60.7 cm/s Olga Millers MD Electronically signed by Olga Millers MD Signature Date/Time: 08/21/2020/9:21:38 AM    Final    VAS US DOPPLER PRE CABG  Result Date: 08/19/2020 PREOPERATIVE VASCULAR EVALUATION  Indications:  Pre-CABG. Risk Factors: Hypertension, Diabetes. Performing Technologist: Jannet Askew RCT RDMS  Examination Guidelines: A complete evaluation includes B-mode imaging, spectral Doppler, color Doppler, and power Doppler as needed of all accessible portions of each vessel. Bilateral testing is considered an integral part of a complete examination. Limited examinations for reoccurring indications may be performed as noted.  Right Carotid Findings: +----------+--------+--------+--------+--------+------------------+           PSV cm/sEDV cm/sStenosisDescribeComments           +----------+--------+--------+--------+--------+------------------+ CCA  Prox  97      20                                         +----------+--------+--------+--------+--------+------------------+ CCA Distal104     27                      intimal thickening +----------+--------+--------+--------+--------+------------------+ ICA Prox  118     36      1-39%           intimal thickening +----------+--------+--------+--------+--------+------------------+ ICA Distal132     39                      intimal thickening +----------+--------+--------+--------+--------+------------------+ ECA       90      5                                          +----------+--------+--------+--------+--------+------------------+ Portions of this table do not appear on this page. +----------+--------+-------+--------+------------+           PSV cm/sEDV cmsDescribeArm Pressure +----------+--------+-------+--------+------------+ Subclavian188     4                           +----------+--------+-------+--------+------------+ +---------+--------+--+--------+--+---------+ VertebralPSV cm/s93EDV cm/s29Antegrade +---------+--------+--+--------+--+---------+ Left Carotid Findings: +----------+--------+--------+--------+--------+------------------+           PSV cm/sEDV cm/sStenosisDescribeComments           +----------+--------+--------+--------+--------+------------------+ CCA Prox  112     29                                         +----------+--------+--------+--------+--------+------------------+ CCA Distal105     23                      intimal thickening +----------+--------+--------+--------+--------+------------------+ ICA Prox  127     39                      intimal thickening +----------+--------+--------+--------+--------+------------------+ ICA Distal106     41                                         +----------+--------+--------+--------+--------+------------------+  ECA       102     13                                          +----------+--------+--------+--------+--------+------------------+ +----------+--------+--------+--------+------------+ SubclavianPSV cm/sEDV cm/sDescribeArm Pressure +----------+--------+--------+--------+------------+           212     7                            +----------+--------+--------+--------+------------+ +---------+--------+--+--------+--+---------+ VertebralPSV cm/s82EDV cm/s24Antegrade +---------+--------+--+--------+--+---------+  ABI Findings: +--------+------------------+-----+---------+--------+ Right   Rt Pressure (mmHg)IndexWaveform Comment  +--------+------------------+-----+---------+--------+ [id number]                    triphasic         +--------+------------------+-----+---------+--------+ +--------+------------------+-----+---------+-------+ Left    Lt Pressure (mmHg)IndexWaveform Comment +--------+------------------+-----+---------+-------+ Brachial120                    triphasic        +--------+------------------+-----+---------+-------+  Right Doppler Findings: +--------+--------+-----+---------+--------+ Site    PressureIndexDoppler  Comments +--------+--------+-----+---------+--------+ [id number]          triphasic         +--------+--------+-----+---------+--------+ Radial               triphasic         +--------+--------+-----+---------+--------+ Ulnar                triphasic         +--------+--------+-----+---------+--------+  Left Doppler Findings: +--------+--------+-----+---------+--------+ Site    PressureIndexDoppler  Comments +--------+--------+-----+---------+--------+ [id number]          triphasic         +--------+--------+-----+---------+--------+ Radial               triphasic         +--------+--------+-----+---------+--------+ Ulnar                triphasic         +--------+--------+-----+---------+--------+  Summary: Right Carotid: Velocities in the  right ICA are consistent with a 1-39% stenosis.                The ECA appears <50% stenosed. Left Carotid: Velocities in the left ICA are consistent with a 1-39% stenosis.               The ECA appears <50% stenosed. Vertebrals: Bilateral vertebral arteries demonstrate antegrade flow. Right Upper Extremity: Doppler waveforms remain within normal limits with right radial compression. Doppler waveforms remain within normal limits with right ulnar compression. Left Upper Extremity: Doppler waveforms decrease 50% with left radial compression. Doppler waveform obliterate with left ulnar compression.  Electronically signed by Gretta Began MD on 08/19/2020 at 3:05:41 PM.    Final    CT Angio Abd/Pel w/ and/or w/o  Result Date: 08/20/2020 CLINICAL DATA:  64 year old male with history of severe aortic stenosis. Preprocedural study prior to potential transcatheter aortic valve replacement (TAVR) procedure. EXAM: CT ANGIOGRAPHY CHEST, ABDOMEN AND PELVIS TECHNIQUE: Non-contrast CT of the chest was initially obtained. Multidetector CT imaging through the chest, abdomen and pelvis was performed using the standard protocol during bolus administration of intravenous contrast. Multiplanar reconstructed images and MIPs were obtained and reviewed to evaluate the vascular anatomy. CONTRAST:  OMNIPAQUE IOHEXOL 350 MG/ML SOLN COMPARISON:  None. FINDINGS: CTA CHEST FINDINGS Cardiovascular: Heart size is mildly enlarged. There  is no significant pericardial fluid, thickening or pericardial calcification. There is aortic atherosclerosis, as well as atherosclerosis of the great vessels of the mediastinum and the coronary arteries, including calcified atherosclerotic plaque in the left anterior descending, left circumflex and right coronary arteries. Severe thickening calcification of the aortic valve. Mild calcifications of the mitral annulus. Mediastinum/Nodes: No pathologically enlarged mediastinal or hilar lymph nodes. Esophagus is  unremarkable in appearance. No axillary lymphadenopathy. Lungs/Pleura: Patchy areas of ground-glass attenuation and mild interlobular septal thickening noted throughout the lungs bilaterally, favored to reflect a background of mild interstitial pulmonary edema. No confluent consolidative airspace disease. Moderate right and small left pleural effusions lying dependently with some associated passive subsegmental atelectasis in the lower lobes of the lungs bilaterally. Mild scarring in the medial aspect of the right upper lobe and inferior segment of the lingula. Musculoskeletal: There are no aggressive appearing lytic or blastic lesions noted in the visualized portions of the skeleton. CTA ABDOMEN AND PELVIS FINDINGS Hepatobiliary: Diffuse low attenuation throughout the hepatic parenchyma, indicative of a background of hepatic steatosis. In the inferior aspect of segment 5 of the liver (axial image 134 of series 4) there is a small hypovascular lesion measuring 1.3 cm in diameter (44 HU), which is indeterminate. No other larger more suspicious appearing hepatic lesions. No intra or extrahepatic biliary ductal dilatation. Gallbladder is normal in appearance. Pancreas: No pancreatic mass. No pancreatic ductal dilatation. No pancreatic or peripancreatic fluid collections or inflammatory changes. Spleen: Unremarkable. Adrenals/Urinary Tract: Subcentimeter low-attenuation lesions in the right kidney, too small to characterize, but statistically likely to represent cysts. Left kidney and bilateral adrenal glands are normal in appearance. No hydroureteronephrosis. Urinary bladder is normal in appearance. Stomach/Bowel: The appearance of the stomach is normal. No pathologic dilatation of small bowel or colon. Normal appendix. Vascular/Lymphatic: Aortic atherosclerosis, with vascular findings and measurements pertinent to potential TAVR procedure, as detailed below. No aneurysm or dissection noted in the abdominal or pelvic  vasculature. No lymphadenopathy noted in the abdomen or pelvis. Reproductive: Prostate gland and seminal vesicles are unremarkable in appearance. Other: Moderate-sized umbilical hernia containing only omental fat. Left inguinal hernia containing predominantly fat. No significant volume of ascites. No pneumoperitoneum. Musculoskeletal: There are no aggressive appearing lytic or blastic lesions noted in the visualized portions of the skeleton. VASCULAR MEASUREMENTS PERTINENT TO TAVR: AORTA: Minimal Aortic Diameter-13 x 12 mm Severity of Aortic Calcification-moderate to severe RIGHT PELVIS: Right Common Iliac Artery - Minimal Diameter-7.3 x 7.8 mm Tortuosity - mild Calcification-moderate Right External Iliac Artery - Minimal Diameter-8.1 x 4.2 mm Tortuosity - mild Calcification-mild Right Common Femoral Artery - Minimal Diameter-7.9 x 7.6 mm Tortuosity - mild Calcification-mild LEFT PELVIS: Left Common Iliac Artery - Minimal Diameter-7.4 x 7.8 mm Tortuosity - mild Calcification-moderate Left External Iliac Artery - Minimal Diameter-7.7 x 5.8 mm Tortuosity - mild Calcification-none Left Common Femoral Artery - Minimal Diameter-8.2 x 8.2 mm Tortuosity-mild Calcification-minimal Review of the MIP images confirms the above findings. IMPRESSION: 1. Vascular findings and measurements pertinent to potential TAVR procedure, as detailed above. 2. Severe thickening calcification of the aortic valve, compatible with the reported clinical history of severe aortic stenosis. 3. Aortic atherosclerosis, in addition to three vessel coronary artery disease. Please note that although the presence of coronary artery calcium documents the presence of coronary artery disease, the severity of this disease and any potential stenosis cannot be assessed on this non-gated CT examination. Assessment for potential risk factor modification, dietary therapy or pharmacologic therapy may be warranted, if clinically  indicated. 4. Cardiomegaly with  evidence of interstitial pulmonary edema and bilateral pleural effusions (right greater than left); imaging findings suggestive of congestive heart failure. 5. Hepatic steatosis. 6. 1.3 cm indeterminate hypovascular lesion in segment 5 of the liver. Correlation with nonemergent abdominal MRI with and without IV gadolinium is recommended in the near future to definitively characterize this lesion and exclude neoplasm. 7. Additional incidental findings, as above. Electronically Signed   By: Trudie Reed M.D.   On: 08/20/2020 08:53    Cardiac Studies   TEE: Severe AS with minimal MV gradient at heart rate of 83 bpm  RIGHT/LEFT HEART CATH AND CORONARY ANGIOGRAPHY  09/17/2020  Conclusion  Widely patent coronary arteries High cardiac output: 9.06 L/min by Fick and 9.23 L/min by thermal dilution.  Etiology uncertain.  Suppressed TSH with normal T4 raising question of T3 thyrotoxicosis versus other mechanism. Severe pulmonary hypertension, mean pressure 48 mmHg, based upon hemodynamics WHO group 2 Pulmonary capillary wedge pressure mean 34 mmHg, pulmonary vascular resistance 1.55 Woods units. Mean right atrial pressure 19 mmHg Aortic valve gradient peak to peak 33 mmHg with mean gradient 22 mmHg. Aortic valve area 1.94 cm by Fick cardiac output and 1.98 cm by thermodilution LV function normal by echocardiography with moderate LVH   RECOMMENDATIONS:   Further work-up/management per treating team. Needs further diuresis. T3 level  Echo 08/17/2020 1. Left ventricular ejection fraction, by estimation, is 60 to 65%. The  left ventricle has normal function. The left ventricle has no regional  wall motion abnormalities. There is moderate concentric left ventricular  hypertrophy. Left ventricular  diastolic parameters are indeterminate.   2. Right ventricular systolic function is normal. The right ventricular  size is normal.   3. Left atrial size was severely dilated.   4. The mitral valve is  normal in structure. Trivial mitral valve  regurgitation. The posterior leaflet appears to have limited mobility with  mild flow acceleration noted across the mitral valve with mean gradient  at HR 101. There is mild narrowing  of the mitral valve orfice without significant stenosis.   5. The aortic valve is tricuspid. There is severe calcifcation of the  aortic valve. There is severe thickening of the aortic valve. Aortic valve  regurgitation is not visualized. Severe aortic valve stenosis with AVA 0.9  (by continuity), mean gradient  , peak gradient , Vmax 4.65cm2, DOI 0.2.   6. The inferior vena cava is dilated in size with >50% respiratory  variability, suggesting right atrial pressure of 8 mmHg.   Patient Profile     64 y.o. male with Diabetes with HTN, HLD, Morbid Obesity and OSA on CPAP, and CKD Stage III who presented for troponin elevation 08/16/20 who found to have valvular heart disease.  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 - if CIN improves tomorrow, will start lasix 20 mg po lasix and can have outpatient TAVR planned; would need BMET in one week.  Elevated troponin  - HS-troponin peaked at 402 - mild non obstructive plaque by cath  - Continue ASA, Statin and BB  HTN - BP stable on current medications  Acute on CKD III - Seems baseline 1.3-1.4 since admit however Scr bumped to 1.69 down to 1.65 today, likely due to dye from CT and CATH  For questions or updates, please contact  [facility] Please consult [email or web address] for contact info under        Signed, Christell Constant, MD  08/21/2020, 10:52 AM

## 2020-08-21 NOTE — Progress Notes (Signed)
  Echocardiogram Echocardiogram Transesophageal has been performed.  Janalyn Harder 08/21/2020, 9:01 AM

## 2020-08-22 DIAGNOSIS — J9601 Acute respiratory failure with hypoxia: Secondary | ICD-10-CM | POA: Diagnosis not present

## 2020-08-22 LAB — BASIC METABOLIC PANEL
Anion gap: 11 (ref 5–15)
BUN: 23 mg/dL (ref 8–23)
CO2: 33 mmol/L — ABNORMAL HIGH (ref 22–32)
Calcium: 9.2 mg/dL (ref 8.9–10.3)
Chloride: 95 mmol/L — ABNORMAL LOW (ref 98–111)
Creatinine, Ser: 1.58 mg/dL — ABNORMAL HIGH (ref 0.61–1.24)
GFR, Estimated: 48 mL/min — ABNORMAL LOW (ref >60)
Glucose, Bld: 345 mg/dL — ABNORMAL HIGH (ref 70–99)
Potassium: 3.9 mmol/L (ref 3.5–5.1)
Sodium: 139 mmol/L (ref 135–145)

## 2020-08-22 LAB — GLUCOSE, CAPILLARY
Glucose-Capillary: 162 mg/dL — ABNORMAL HIGH (ref 70–99)
Glucose-Capillary: 279 mg/dL — ABNORMAL HIGH (ref 70–99)
Glucose-Capillary: 180 mg/dL — ABNORMAL HIGH (ref 70–99)
Glucose-Capillary: 229 mg/dL — ABNORMAL HIGH (ref 70–99)

## 2020-08-22 NOTE — Progress Notes (Signed)
[facility] PROGRESS NOTE    Progress Note  Lawrence Rose  HUD:[id number] DOB: 12/13/1955 DOA: 08/15/2020 PCP: Pcp, No     Brief Narrative:   Lawrence Rose is an 64 y.o. male past medical history significant for obesity, essential hypertension diabetes mellitus type 2 presents to med [facility] with shortness of breath for 1 week.  As per patient his Lasix dose was reduced about a month earlier.  CT was concerning for pneumonia, he also had elevated troponins he was placed IV heparin Rocephin and azithromycin and transferred to [facility], the echo done on August 17, 2020 shows severe aortic stenosis with severe LVH  Assessment/Plan:   Acute respiratory failure with hypoxia (HCC) due to severe aortic stenosis leading to pulmonary edema: Cardiology and CT surgery was consulted who recommended a TEE,  that showed severe AS. Patient will have TAVR's as an outpatient. Appreciate cardiology assistance.    Acute kidney injury on chronic kidney disease stage IIIa: With a baseline creatinine of 1.3, continue to hold diuretic therapy basic metabolic panel is pending this morning.  Elevated troponins: Currently on aspirin, statins, continue to hold ACE inhibitor due to mild rise in his creatinine.  Low TSH: We will need to check free T4 and TSH in 6 weeks question sick euthyroid syndrome.  Chronic lower extremity edema: Has resolved.  Controlled diabetes mellitus type 2: Metformin was discontinued not on insulin at home, his A1c is 5.5 we will need to monitor as an outpatient.  Normocytic anemia/anemia of chronic disease: Noted.  Essential hypertension: Blood pressure is fairly controlled currently on Cardizem clonidine metoprolol. Continue to hold Lasix and ACE inhibitor until being it is back.  Obstructive sleep apnea: CPAP at bedtime.  Chronic pain: Continue Norco and Neurontin.    Obesity, Class III, BMI 40-49.9 (morbid obesity) (HCC) Estimated body mass  index is 43.35 kg/m as calculated from the following:   Height as of this encounter: 5\' 11"  (1.803 m).   Weight as of this encounter: 141 kg.   DVT prophylaxis: lovenox Family Communication:none Status is: Inpatient  Remains inpatient appropriate because:Hemodynamically unstable   Dispo: The patient is from: Home              Anticipated d/c is to: Home              Anticipated d/c date is: 2 days              Patient currently is not medically stable to d/c.     Code Status:     Code Status Orders  (From admission, onward)         Start     Ordered   08/16/20 1440  Full code  Continuous        08/16/20 1443        Code Status History    This patient has a current code status but no historical code status.   Advance Care Planning Activity        IV Access:    Peripheral IV   Procedures and diagnostic studies:   ECHO TEE  Result Date: 08/21/2020    TRANSESOPHOGEAL ECHO REPORT   [name] Date of Exam: 08/21/2020 Medical Rec #:  13/02/2020      Height:       71.0 in Accession #:    [id number]     Weight:       303.2 lb Date of Birth:  September 18, 1956  BSA:          2.516 m Patient Age:    64 years       BP:           149/75 mmHg Patient Gender: M              HR:           87 bpm. Exam Location:  Inpatient Procedure: Transesophageal Echo, Cardiac Doppler and Color Doppler Indications:     I35.0 Nonrheumatic aortic (valve) stenosis  History:         Patient has prior history of Echocardiogram examinations, most                  recent 08/17/2020. Previous Myocardial Infarction and CAD,                  Aortic Valve Disease; Risk Factors:Sleep Apnea. Severe aortic                  stenosis.  Sonographer:     Sheralyn Boatman RDCS Referring Phys:  [id number] Wille Celeste THOMPSON Diagnosing Phys: Olga Millers MD PROCEDURE: After discussion of the risks and benefits of a TEE, an informed consent was obtained from the patient. The transesophogeal probe was passed  without difficulty through the esophogus of the patient. Imaged were obtained with the patient in a left lateral decubitus position. Sedation performed by different physician. The patient was monitored while under deep sedation. Anesthestetic sedation was provided intravenously by Anesthesiology: 300mg  of Propofol. The patient's vital signs; including heart rate, blood pressure, and oxygen saturation; remained stable throughout the procedure. The patient developed no complications during the procedure. IMPRESSIONS  1. Severe AS (mean gradient 42 mmHg and peak velocity of 4.2 m/s); mild AI.  2. Left ventricular ejection fraction, by estimation, is 70 to 75%. The left ventricle has hyperdynamic function. There is severe left ventricular hypertrophy.  3. Right ventricular systolic function is normal. The right ventricular size is normal.  4. Left atrial size was moderately dilated. No left atrial/left atrial appendage thrombus was detected.  5. The mitral valve is normal in structure. Trivial mitral valve regurgitation.  6. The aortic valve is tricuspid. Aortic valve regurgitation is mild. Severe aortic valve stenosis.  7. There is Moderate (Grade III) plaque involving the descending aorta. FINDINGS  Left Ventricle: Left ventricular ejection fraction, by estimation, is 70 to 75%. The left ventricle has hyperdynamic function. The left ventricular internal cavity size was normal in size. There is severe left ventricular hypertrophy. Right Ventricle: The right ventricular size is normal. Right vetricular wall thickness was not assessed. Right ventricular systolic function is normal. Left Atrium: Left atrial size was moderately dilated. No left atrial/left atrial appendage thrombus was detected. Right Atrium: Right atrial size was normal in size. Pericardium: There is no evidence of pericardial effusion. Mitral Valve: The mitral valve is normal in structure. Mild mitral annular calcification. Trivial mitral valve  regurgitation. MV peak gradient, 5.8 mmHg. The mean mitral valve gradient is 2.0 mmHg. Tricuspid Valve: The tricuspid valve is normal in structure. Tricuspid valve regurgitation is mild. Aortic Valve: The aortic valve is tricuspid. Aortic valve regurgitation is mild. Severe aortic stenosis is present. Aortic valve mean gradient measures 41.5 mmHg. Aortic valve peak gradient measures 66.7 mmHg. Aortic valve area, by VTI measures 1.04 cm. Pulmonic Valve: The pulmonic valve was normal in structure. Pulmonic valve regurgitation is not visualized. Aorta: The aortic root is normal in size and structure. There  is moderate (Grade III) plaque involving the descending aorta. IAS/Shunts: No atrial level shunt detected by color flow Doppler. Additional Comments: Severe AS (mean gradient 42 mmHg and peak velocity of 4.2 m/s); mild AI.  LEFT VENTRICLE PLAX 2D LVOT diam:     2.20 cm LV SV:         81 LV SV Index:   32 LVOT Area:     3.80 cm  AORTIC VALVE AV Area (Vmax):    1.01 cm AV Area (Vmean):   0.92 cm AV Area (VTI):     1.04 cm AV Vmax:           408.50 cm/s AV Vmean:          307.500 cm/s AV VTI:            0.778 m AV Peak Grad:      66.7 mmHg AV Mean Grad:      41.5 mmHg LVOT Vmax:         108.00 cm/s LVOT Vmean:        74.800 cm/s LVOT VTI:          0.214 m LVOT/AV VTI ratio: 0.27 MITRAL VALVE MV Peak grad: 5.8 mmHg  SHUNTS MV Mean grad: 2.0 mmHg  Systemic VTI:  0.21 m MV Vmax:      1.20 m/s  Systemic Diam: 2.20 cm MV Vmean:     60.7 cm/s Olga MillersBrian Crenshaw MD Electronically signed by Olga MillersBrian Crenshaw MD Signature Date/Time: 08/21/2020/9:21:38 AM    Final      Medical Consultants:    None.  Anti-Infectives:   none  Subjective:    Lawrence Rose relates his breathing is significantly improved compared to admission no new complaints.  Objective:    Vitals:   08/21/20 1006 08/21/20 2008 08/22/20 0353 08/22/20 0821  BP: (!) 143/80 133/67 129/73 (!) 147/65  Pulse: 75 82 72 78  Resp: 20 17 16 20   Temp:  99 F (37.2 C) 98.4 F (36.9 C) 98.1 F (36.7 C) 98.1 F (36.7 C)  TempSrc: Oral Oral Oral Oral  SpO2: 97% 96% 92% 94%  Weight:   (!) 141 kg   Height:       SpO2: 94 % O2 Flow Rate (L/min): 6 L/min   Intake/Output Summary (Last 24 hours) at 08/22/2020 0843 Last data filed at 08/22/2020 [id number] Gross per 24 hour  Intake 1030.4 ml  Output 2400 ml  Net -1369.6 ml   Filed Weights   08/21/20 0556 08/21/20 0740 08/22/20 0353  Weight: (!) 137.5 kg (!) 137.5 kg (!) 141 kg    Exam: General exam: In no acute distress. Respiratory system: Good air movement and clear to auscultation. Cardiovascular system: S1 & S2 heard, RRR. No JVD. Gastrointestinal system: Abdomen is nondistended, soft and nontender.  Extremities: No pedal edema. Skin: No rashes, lesions or ulcers  Data Reviewed:    Labs: Basic Metabolic Panel: Recent Labs  Lab 08/16/20 1459 08/16/20 1459 08/17/20 0351 08/17/20 0351 08/18/20 0320 08/18/20 0320 08/18/20 1327 08/18/20 1327 08/18/20 1331 08/18/20 1331 08/19/20 0815 08/19/20 0815 08/20/20 0219 08/21/20 0147  NA 139   < > 139   < > 139   < > 143  --  142  --  142  --  140 139  K 4.2   < > 3.9   < > 4.2   < > 4.0   < > 4.0   < > 4.0   < > 4.1 4.2  CL 95*   < >  99  --  100  --   --   --   --   --  99  --  97* 97*  CO2 29   < > 28  --  30  --   --   --   --   --  33*  --  34* 33*  GLUCOSE 209*   < > 166*  --  158*  --   --   --   --   --  154*  --  177* 174*  BUN 22   < > 23  --  23  --   --   --   --   --  20  --  21 25*  CREATININE 1.41*   < > 1.43*  --  1.43*  --   --   --   --   --  1.36*  --  1.69* 1.65*  CALCIUM 9.5   < > 8.9  --  8.7*  --   --   --   --   --  9.1  --  9.0 8.8*  MG 2.0  --   --   --   --   --   --   --   --   --   --   --   --   --    < > = values in this interval not displayed.   GFR Estimated Creatinine Clearance: 63.3 mL/min (A) (by C-G formula based on SCr of 1.65 mg/dL (H)). Liver Function Tests: Recent Labs  Lab  08/15/20 1801  AST 17  ALT 16  ALKPHOS 50  BILITOT 0.4  PROT 7.7  ALBUMIN 3.7   No results for input(s): LIPASE, AMYLASE in the last 168 hours. No results for input(s): AMMONIA in the last 168 hours. Coagulation profile No results for input(s): INR, PROTIME in the last 168 hours. COVID-19 Labs  No results for input(s): DDIMER, FERRITIN, LDH, CRP in the last 72 hours.  Lab Results  Component Value Date   SARSCOV2NAA NEGATIVE 08/15/2020    CBC: Recent Labs  Lab 08/15/20 1801 08/15/20 1801 08/16/20 1459 08/16/20 1459 08/17/20 0351 08/18/20 0320 08/18/20 1327 08/18/20 1331 08/19/20 0219  WBC 8.4  --  10.8*  --  13.9* 12.3*  --   --  8.9  NEUTROABS 6.2  --   --   --   --   --   --   --   --   HGB 10.1*   < > 10.6*   < > 10.1* 9.8* 11.2* 11.2* 9.9*  HCT 34.0*   < > 36.1*   < > 33.9* 33.9* 33.0* 33.0* 34.8*  MCV 84.4  --  83.8  --  84.3 86.0  --   --  86.1  PLT 306  --  340  --  322 307  --   --  279   < > = values in this interval not displayed.   Cardiac Enzymes: No results for input(s): CKTOTAL, CKMB, CKMBINDEX, TROPONINI in the last 168 hours. BNP (last 3 results) No results for input(s): PROBNP in the last 8760 hours. CBG: Recent Labs  Lab 08/21/20 0650 08/21/20 1109 08/21/20 1609 08/21/20 2123 08/22/20 0607  GLUCAP 156* 174* 184* 220* 162*   D-Dimer: No results for input(s): DDIMER in the last 72 hours. Hgb A1c: No results for input(s): HGBA1C in the last 72 hours. Lipid Profile: No results for input(s): CHOL, HDL, LDLCALC, TRIG, CHOLHDL, LDLDIRECT  in the last 72 hours. Thyroid function studies: No results for input(s): TSH, T4TOTAL, T3FREE, THYROIDAB in the last 72 hours.  Invalid input(s): FREET3 Anemia work up: No results for input(s): VITAMINB12, FOLATE, FERRITIN, TIBC, IRON, RETICCTPCT in the last 72 hours. Sepsis Labs: Recent Labs  Lab 08/16/20 1459 08/17/20 0351 08/18/20 0320 08/19/20 0219  WBC 10.8* 13.9* 12.3* 8.9    Microbiology Recent Results (from the past 240 hour(s))  Respiratory Panel by RT PCR (Flu A&B, Covid) - Nasopharyngeal Swab     Status: None   Collection Time: 08/15/20  5:19 PM   Specimen: Nasopharyngeal Swab  Result Value Ref Range Status   SARS Coronavirus 2 by RT PCR NEGATIVE NEGATIVE Final    Comment: (NOTE) SARS-CoV-2 target nucleic acids are NOT DETECTED.  The SARS-CoV-2 RNA is generally detectable in upper respiratoy specimens during the acute phase of infection. The lowest concentration of SARS-CoV-2 viral copies this assay can detect is 131 copies/mL. A negative result does not preclude SARS-Cov-2 infection and should not be used as the sole basis for treatment or other patient management decisions. A negative result may occur with  improper specimen collection/handling, submission of specimen other than nasopharyngeal swab, presence of viral mutation(s) within the areas targeted by this assay, and inadequate number of viral copies (<131 copies/mL). A negative result must be combined with clinical observations, patient history, and epidemiological information. The expected result is Negative.  Fact Sheet for Patients:  https://www.moore.com/  Fact Sheet for Healthcare Providers:  https://www.young.biz/  This test is no t yet approved or cleared by the [place] FDA and  has been authorized for detection and/or diagnosis of SARS-CoV-2 by FDA under an Emergency Use Authorization (EUA). This EUA will remain  in effect (meaning this test can be used) for the duration of the COVID-19 declaration under Section 564(b)(1) of the Act, 21 U.S.C. section 360bbb-3(b)(1), unless the authorization is terminated or revoked sooner.     Influenza A by PCR NEGATIVE NEGATIVE Final   Influenza B by PCR NEGATIVE NEGATIVE Final    Comment: (NOTE) The Xpert Xpress SARS-CoV-2/FLU/RSV assay is intended as an aid in  the diagnosis of influenza  from Nasopharyngeal swab specimens and  should not be used as a sole basis for treatment. Nasal washings and  aspirates are unacceptable for Xpert Xpress SARS-CoV-2/FLU/RSV  testing.  Fact Sheet for Patients: https://www.moore.com/  Fact Sheet for Healthcare Providers: https://www.young.biz/  This test is not yet approved or cleared by the [place] FDA and  has been authorized for detection and/or diagnosis of SARS-CoV-2 by  FDA under an Emergency Use Authorization (EUA). This EUA will remain  in effect (meaning this test can be used) for the duration of the  Covid-19 declaration under Section 564(b)(1) of the Act, 21  U.S.C. section 360bbb-3(b)(1), unless the authorization is  terminated or revoked. Performed at [facility], [address] [place], [place] [address]   Culture, blood (routine x 2) Call MD if unable to obtain prior to antibiotics being given     Status: None   Collection Time: 08/16/20  9:04 PM   Specimen: BLOOD  Result Value Ref Range Status   Specimen Description BLOOD RIGHT ARM  Final   Special Requests   Final    BOTTLES DRAWN AEROBIC AND ANAEROBIC Blood Culture results may not be optimal due to an excessive volume of blood received in culture bottles   Culture   Final    NO GROWTH 5 DAYS  Performed at [facility], [address], [place], [place] [address]    Report Status 08/21/2020 FINAL  Final  Culture, blood (routine x 2) Call MD if unable to obtain prior to antibiotics being given     Status: None   Collection Time: 08/16/20  9:05 PM   Specimen: BLOOD RIGHT HAND  Result Value Ref Range Status   Specimen Description BLOOD RIGHT HAND  Final   Special Requests   Final    BOTTLES DRAWN AEROBIC ONLY Blood Culture adequate volume   Culture   Final    NO GROWTH 5 DAYS Performed at [facility], [address] [place], [place] [address]    Report Status 08/21/2020 FINAL  Final      Medications:   . aspirin EC  81 mg Oral Daily  . chlorhexidine  15 mL Mouth Rinse BID  . cholecalciferol  1,000 Units Oral QHS  . cloNIDine  0.1 mg Oral BID  . diltiazem  240 mg Oral Daily  . folic acid  1 mg Oral Daily  . gabapentin  600 mg Oral BID  . heparin  5,000 Units Subcutaneous Q8H  . insulin aspart  0-9 Units Subcutaneous TID WC  . latanoprost  1 drop Both Eyes QHS  . lisinopril  40 mg Oral QHS  . mouth rinse  15 mL Mouth Rinse q12n4p  . metoprolol tartrate  25 mg Oral BID  . multivitamin with minerals  1 tablet Oral QHS  . oxyCODONE  10 mg Oral Q12H  . pantoprazole  40 mg Oral BID  . rosuvastatin  20 mg Oral Daily  . sodium chloride flush  3 mL Intravenous Q12H   Continuous Infusions: . sodium chloride 140 mL/hr at 08/18/20 0918  . sodium chloride    . lactated ringers 10 mL/hr at 08/21/20 1655      LOS: 6 days   Marinda Elk  [facility]  08/22/2020, 8:43 AM

## 2020-08-22 NOTE — Progress Notes (Signed)
Progress Note  Patient Name: Lawrence Rose Date of Encounter: 08/22/2020  [facility] Cardiologist: Christell ConstantMahesh A Jhaden Pizzuto, MD   Subjective   Breathing back to normal. No chest pain.  Able to walk around unit with PT.  Inpatient Medications    Scheduled Meds:  aspirin EC  81 mg Oral Daily   chlorhexidine  15 mL Mouth Rinse BID   cholecalciferol  1,000 Units Oral QHS   cloNIDine  0.1 mg Oral BID   diltiazem  240 mg Oral Daily   folic acid  1 mg Oral Daily   gabapentin  600 mg Oral BID   heparin  5,000 Units Subcutaneous Q8H   insulin aspart  0-9 Units Subcutaneous TID WC   latanoprost  1 drop Both Eyes QHS   mouth rinse  15 mL Mouth Rinse q12n4p   metoprolol tartrate  25 mg Oral BID   multivitamin with minerals  1 tablet Oral QHS   oxyCODONE  10 mg Oral Q12H   pantoprazole  40 mg Oral BID   rosuvastatin  20 mg Oral Daily   sodium chloride flush  3 mL Intravenous Q12H   Continuous Infusions:  sodium chloride 140 mL/hr at 08/18/20 0918   sodium chloride     lactated ringers 10 mL/hr at 08/21/20 1655   PRN Meds: sodium chloride, sodium chloride, acetaminophen, HYDROcodone-acetaminophen, nitroGLYCERIN, ondansetron (ZOFRAN) IV, oxyCODONE, sodium chloride flush   Vital Signs    Vitals:   08/21/20 2008 08/22/20 0353 08/22/20 0821 08/22/20 1138  BP: 133/67 129/73 (!) 147/65 (!) 116/56  Pulse: 82 72 78 72  Resp: 17 16 20 20   Temp: 98.4 F (36.9 C) 98.1 F (36.7 C) 98.1 F (36.7 C) 97.8 F (36.6 C)  TempSrc: Oral Oral Oral Oral  SpO2: 96% 92% 94% 98%  Weight:  (!) 141 kg    Height:        Intake/Output Summary (Last 24 hours) at 08/22/2020 1216 Last data filed at 08/22/2020 1008 Gross per 24 hour  Intake 1070.4 ml  Output 2650 ml  Net -1579.6 ml   Last 3 Weights 08/22/2020 08/21/2020 08/21/2020  Weight (lbs) 310 lb 12.8 oz 303 lb 3.2 oz 303 lb 3.2 oz  Weight (kg) 140.978 kg 137.531 kg 137.531 kg      Telemetry    Sinus rhythm - Personally Reviewed  ECG     N/A  Physical Exam   GEN: No acute distress.   Neck: No JVD Cardiac: RRR,3/6 systolic murmurs, rubs, or gallops.  Respiratory: Clear to auscultation bilaterally. GI: Soft, nontender, non-distended  MS: No edema; No deformity. Neuro:  Nonfocal  Psych: Normal affect   Labs    High Sensitivity Troponin:   Recent Labs  Lab 08/15/20 1955 08/15/20 2241 08/16/20 1459 08/16/20 1815 08/16/20 2105  TROPONINIHS 402* 330* 148* 164* 176*  175*      Chemistry Recent Labs  Lab 08/15/20 1801 08/16/20 1459 08/20/20 0219 08/21/20 0147 08/22/20 0903  NA 140   < > 140 139 139  K 4.4   < > 4.1 4.2 3.9  CL 100   < > 97* 97* 95*  CO2 29   < > 34* 33* 33*  GLUCOSE 120*   < > 177* 174* 345*  BUN 17   < > 21 25* 23  CREATININE 1.38*   < > 1.69* 1.65* 1.58*  CALCIUM 9.2   < > 9.0 8.8* 9.2  PROT 7.7  --   --   --   --  ALBUMIN 3.7  --   --   --   --   AST 17  --   --   --   --   ALT 16  --   --   --   --   ALKPHOS 50  --   --   --   --   BILITOT 0.4  --   --   --   --   GFRNONAA 56*   < > 44* 46* 48*  ANIONGAP 11   < > 9 9 11    < > = values in this interval not displayed.     Hematology Recent Labs  Lab 08/17/20 0351 08/17/20 0351 08/18/20 0320 08/18/20 0320 08/18/20 1327 08/18/20 1331 08/19/20 0219  WBC 13.9*  --  12.3*  --   --   --  8.9  RBC 4.02*  --  3.94*  --   --   --  4.04*  HGB 10.1*   < > 9.8*   < > 11.2* 11.2* 9.9*  HCT 33.9*   < > 33.9*   < > 33.0* 33.0* 34.8*  MCV 84.3  --  86.0  --   --   --  86.1  MCH 25.1*  --  24.9*  --   --   --  24.5*  MCHC 29.8*  --  28.9*  --   --   --  28.4*  RDW 15.3  --  15.3  --   --   --  15.3  PLT 322  --  307  --   --   --  279   < > = values in this interval not displayed.    BNP Recent Labs  Lab 08/15/20 1801 08/16/20 2105  BNP 152.3* 246.2*     DDimer  Recent Labs  Lab 08/16/20 1459  DDIMER 1.02*     Radiology    ECHO TEE  Result Date: 08/21/2020    TRANSESOPHOGEAL ECHO REPORT   Patient Name:    Lawrence Rose Date of Exam: 08/21/2020 Medical Rec #:  13/02/2020      Height:       71.0 in Accession #:    [id number]     Weight:       303.2 lb Date of Birth:  03-Mar-1956      BSA:          2.516 m Patient Age:    64 years       BP:           149/75 mmHg Patient Gender: M              HR:           87 bpm. Exam Location:  Inpatient Procedure: Transesophageal Echo, Cardiac Doppler and Color Doppler Indications:     I35.0 Nonrheumatic aortic (valve) stenosis  History:         Patient has prior history of Echocardiogram examinations, most                  recent 08/17/2020. Previous Myocardial Infarction and CAD,                  Aortic Valve Disease; Risk Factors:Sleep Apnea. Severe aortic                  stenosis.  Sonographer:     13/10/2019 RDCS Referring Phys:  Sheralyn Boatman [id number] THOMPSON Diagnosing Phys: Wille Celeste MD PROCEDURE: After discussion of the  risks and benefits of a TEE, an informed consent was obtained from the patient. The transesophogeal probe was passed without difficulty through the esophogus of the patient. Imaged were obtained with the patient in a left lateral decubitus position. Sedation performed by different physician. The patient was monitored while under deep sedation. Anesthestetic sedation was provided intravenously by Anesthesiology:  of Propofol. The patient's vital signs; including heart rate, blood pressure, and oxygen saturation; remained stable throughout the procedure. The patient developed no complications during the procedure. IMPRESSIONS  1. Severe AS (mean gradient 42 mmHg and peak velocity of 4.2 m/s); mild AI.  2. Left ventricular ejection fraction, by estimation, is 70 to 75%. The left ventricle has hyperdynamic function. There is severe left ventricular hypertrophy.  3. Right ventricular systolic function is normal. The right ventricular size is normal.  4. Left atrial size was moderately dilated. No left atrial/left atrial appendage thrombus was detected.  5. The  mitral valve is normal in structure. Trivial mitral valve regurgitation.  6. The aortic valve is tricuspid. Aortic valve regurgitation is mild. Severe aortic valve stenosis.  7. There is Moderate (Grade III) plaque involving the descending aorta. FINDINGS  Left Ventricle: Left ventricular ejection fraction, by estimation, is 70 to 75%. The left ventricle has hyperdynamic function. The left ventricular internal cavity size was normal in size. There is severe left ventricular hypertrophy. Right Ventricle: The right ventricular size is normal. Right vetricular wall thickness was not assessed. Right ventricular systolic function is normal. Left Atrium: Left atrial size was moderately dilated. No left atrial/left atrial appendage thrombus was detected. Right Atrium: Right atrial size was normal in size. Pericardium: There is no evidence of pericardial effusion. Mitral Valve: The mitral valve is normal in structure. Mild mitral annular calcification. Trivial mitral valve regurgitation. MV peak gradient, 5.8 mmHg. The mean mitral valve gradient is 2.0 mmHg. Tricuspid Valve: The tricuspid valve is normal in structure. Tricuspid valve regurgitation is mild. Aortic Valve: The aortic valve is tricuspid. Aortic valve regurgitation is mild. Severe aortic stenosis is present. Aortic valve mean gradient measures 41.5 mmHg. Aortic valve peak gradient measures 66.7 mmHg. Aortic valve area, by VTI measures 1.04 cm. Pulmonic Valve: The pulmonic valve was normal in structure. Pulmonic valve regurgitation is not visualized. Aorta: The aortic root is normal in size and structure. There is moderate (Grade III) plaque involving the descending aorta. IAS/Shunts: No atrial level shunt detected by color flow Doppler. Additional Comments: Severe AS (mean gradient 42 mmHg and peak velocity of 4.2 m/s); mild AI.  LEFT VENTRICLE PLAX 2D LVOT diam:     2.20 cm LV SV:         81 LV SV Index:   32 LVOT Area:     3.80 cm  AORTIC VALVE AV Area  (Vmax):    1.01 cm AV Area (Vmean):   0.92 cm AV Area (VTI):     1.04 cm AV Vmax:           408.50 cm/s AV Vmean:          307.500 cm/s AV VTI:            0.778 m AV Peak Grad:      66.7 mmHg AV Mean Grad:      41.5 mmHg LVOT Vmax:         108.00 cm/s LVOT Vmean:        74.800 cm/s LVOT VTI:          0.214 m LVOT/AV VTI  ratio: 0.27 MITRAL VALVE MV Peak grad: 5.8 mmHg  SHUNTS MV Mean grad: 2.0 mmHg  Systemic VTI:  0.21 m MV Vmax:      1.20 m/s  Systemic Diam: 2.20 cm MV Vmean:     60.7 cm/s Olga Millers MD Electronically signed by Olga Millers MD Signature Date/Time: 08/21/2020/9:21:38 AM    Final     Cardiac Studies   TEE 08/21/2020 IMPRESSIONS     1. Severe AS (mean gradient 42 mmHg and peak velocity of 4.2 m/s); mild  AI.   2. Left ventricular ejection fraction, by estimation, is 70 to 75%. The  left ventricle has hyperdynamic function. There is severe left ventricular  hypertrophy.   3. Right ventricular systolic function is normal. The right ventricular  size is normal.   4. Left atrial size was moderately dilated. No left atrial/left atrial  appendage thrombus was detected.   5. The mitral valve is normal in structure. Trivial mitral valve  regurgitation.   6. The aortic valve is tricuspid. Aortic valve regurgitation is mild.  Severe aortic valve stenosis.   7. There is Moderate (Grade III) plaque involving the descending aorta.    RIGHT/LEFT HEART CATH AND CORONARY ANGIOGRAPHY  09/17/2020  Conclusion   Widely patent coronary arteries High cardiac output: 9.06 L/min by Fick and 9.23 L/min by thermal dilution.  Etiology uncertain.  Suppressed TSH with normal T4 raising question of T3 thyrotoxicosis versus other mechanism. Severe pulmonary hypertension, mean pressure 48 mmHg, based upon hemodynamics WHO group 2 Pulmonary capillary wedge pressure mean 34 mmHg, pulmonary vascular resistance 1.55 Woods units. Mean right atrial pressure 19 mmHg Aortic valve gradient peak to peak  33 mmHg with mean gradient 22 mmHg. Aortic valve area 1.94 cm by Fick cardiac output and 1.98 cm by thermodilution LV function normal by echocardiography with moderate LVH   RECOMMENDATIONS:   Further work-up/management per treating team. Needs further diuresis. T3 level   Echo 08/17/2020 1. Left ventricular ejection fraction, by estimation, is 60 to 65%. The  left ventricle has normal function. The left ventricle has no regional  wall motion abnormalities. There is moderate concentric left ventricular  hypertrophy. Left ventricular  diastolic parameters are indeterminate.   2. Right ventricular systolic function is normal. The right ventricular  size is normal.   3. Left atrial size was severely dilated.   4. The mitral valve is normal in structure. Trivial mitral valve  regurgitation. The posterior leaflet appears to have limited mobility with  mild flow acceleration noted across the mitral valve with mean gradient  at HR 101. There is mild narrowing  of the mitral valve orfice without significant stenosis.   5. The aortic valve is tricuspid. There is severe calcifcation of the  aortic valve. There is severe thickening of the aortic valve. Aortic valve  regurgitation is not visualized. Severe aortic valve stenosis with AVA 0.9  (by continuity), mean gradient  , peak gradient , Vmax 4.65cm2, DOI 0.2.   6. The inferior vena cava is dilated in size with >50% respiratory  variability, suggesting right atrial pressure of 8 mmHg.    Patient Profile     64 y.o. male with Diabetes with HTN, HLD, Morbid Obesity and OSA on CPAP, and CKD Stage III who presented for troponin elevation 08/16/20 who found to have severe AS.   Assessment & Plan    Aortic stenosis/mild Mitral Stenosis with pulmonary hypertension AKI on CKD HTN - Underwent TEE this morning showing severe AS (mean  gradient 42 mmHg and peak velocity of 4.2 m/s); mild AI. - Planned for outpatient TAVR;  will make sure patient has structural heart follow up (possible 09/01/20) - holding diuresis in the setting of contrast induced nephropathy (slowly improving) - Continue ASA, Statin and BB - likely kidney recovery will help guide timing for TAVR   For questions or updates, please contact [facility] Please consult [email or web address] for contact info under        Signed, Christell Constant, MD  08/22/2020, 12:16 PM

## 2020-08-22 NOTE — Plan of Care (Signed)
  Problem: Activity: Goal: Risk for activity intolerance will decrease Outcome: Progressing   Problem: Pain Managment: Goal: General experience of comfort will improve Outcome: Progressing   

## 2020-08-23 DIAGNOSIS — J9601 Acute respiratory failure with hypoxia: Secondary | ICD-10-CM | POA: Diagnosis not present

## 2020-08-23 DIAGNOSIS — R0602 Shortness of breath: Secondary | ICD-10-CM | POA: Diagnosis not present

## 2020-08-23 DIAGNOSIS — I350 Nonrheumatic aortic (valve) stenosis: Secondary | ICD-10-CM | POA: Diagnosis not present

## 2020-08-23 DIAGNOSIS — N1831 Chronic kidney disease, stage 3a: Secondary | ICD-10-CM | POA: Diagnosis not present

## 2020-08-23 DIAGNOSIS — E119 Type 2 diabetes mellitus without complications: Secondary | ICD-10-CM

## 2020-08-23 DIAGNOSIS — I10 Essential (primary) hypertension: Secondary | ICD-10-CM

## 2020-08-23 LAB — GLUCOSE, CAPILLARY
Glucose-Capillary: 178 mg/dL — ABNORMAL HIGH (ref 70–99)
Glucose-Capillary: 194 mg/dL — ABNORMAL HIGH (ref 70–99)

## 2020-08-23 LAB — BASIC METABOLIC PANEL
Anion gap: 9 (ref 5–15)
BUN: 21 mg/dL (ref 8–23)
CO2: 31 mmol/L (ref 22–32)
Calcium: 8.9 mg/dL (ref 8.9–10.3)
Chloride: 97 mmol/L — ABNORMAL LOW (ref 98–111)
Creatinine, Ser: 1.43 mg/dL — ABNORMAL HIGH (ref 0.61–1.24)
GFR, Estimated: 54 mL/min — ABNORMAL LOW (ref >60)
Glucose, Bld: 225 mg/dL — ABNORMAL HIGH (ref 70–99)
Potassium: 4.1 mmol/L (ref 3.5–5.1)
Sodium: 137 mmol/L (ref 135–145)

## 2020-08-23 MED ORDER — LISINOPRIL 40 MG PO TABS
40.0000 mg | ORAL_TABLET | Freq: Every day | ORAL | Status: DC
Start: 2020-08-26 — End: 2020-09-16

## 2020-08-23 MED ORDER — METOPROLOL TARTRATE 25 MG PO TABS
25.0000 mg | ORAL_TABLET | Freq: Two times a day (BID) | ORAL | 3 refills | Status: DC
Start: 2020-08-23 — End: 2020-10-14

## 2020-08-23 MED ORDER — FUROSEMIDE 40 MG PO TABS
20.0000 mg | ORAL_TABLET | Freq: Every day | ORAL | Status: DC
Start: 2020-08-26 — End: 2020-09-16

## 2020-08-23 MED ORDER — POTASSIUM CHLORIDE CRYS ER 20 MEQ PO TBCR
20.0000 meq | EXTENDED_RELEASE_TABLET | Freq: Every day | ORAL | Status: DC
Start: 2020-08-26 — End: 2020-09-16

## 2020-08-23 NOTE — Discharge Instructions (Signed)
Aortic Valve Stenosis  Aortic valve stenosis is a narrowing of the aortic valve in the heart. The aortic valve opens and closes to regulate blood flow between the left side of the heart (left ventricle) and the artery that leads away from the heart (aorta). When the aortic valve becomes narrow, it is difficult for the heart to pump blood out to the body, which causes the heart to work harder. The extra work can weaken the heart muscle over time. Aortic valve stenosis can range from mild to severe. If it is not treated, it can become more severe over time and lead to heart failure. What are the causes? This condition may be caused by:  Buildup of calcium around and on the aortic valve. This can occur with aging. This is the most common cause of aortic valve stenosis.  A heart problem that developed in the womb (birth defect).  Rheumatic fever.  Radiation to the chest. What increases the risk? You may be more likely to develop this condition if:  You are older than age 41.  You were born with an abnormal bicuspid valve. What are the signs or symptoms? You may not have any symptoms until your condition becomes severe. It may take 10-20 years for mild or moderate aortic valve stenosis to become severe. Symptoms may include:  Shortness of breath. This may get worse during physical activity.  Feeling unusually weak and tired (fatigue).  Extreme discomfort in the chest, neck, or arm during physical activity (angina).  A heartbeat that is irregular or faster than normal (palpitations).  Dizziness or fainting. This may happen when you get physically tired or after you take certain heart medicines, such as nitroglycerin. How is this diagnosed? This condition may be diagnosed with:  A physical exam.  Echocardiogram. This is a type of imaging test that uses sound waves (ultrasound) to make images of your heart. There are two kinds of this test that may be used. ? Transthoracic  echocardiogram (TTE). For this type, a wand-like tool (transducer) is moved over your chest to create ultrasound images that are recorded by a computer. ? Transesophageal echocardiogram (TEE). For this type, a flexible tube (probe) is inserted down the part of the body that moves food from your mouth to your stomach (esophagus). The heart and the esophagus are close to each other. Your health care provider will use the probe to take clear, detailed pictures of the heart.  Cardiac catheterization. For this procedure, a small, thin tube (catheter) is passed through a large vein in your neck, groin, or arm. The catheter is used to get information about arteries, structures, blood pressure, and oxygen levels in your heart.  Stress tests. These are tests that evaluate the blood supply to your heart and your heart's response to exercise. You may work with a health care provider who specializes in the heart (cardiologist) for diagnosis and treatment. How is this treated? Treatment depends on how severe your condition is and what your symptoms are. You will need to have your heart checked regularly to make sure that your condition is not getting worse or causing serious problems. Treatment may also include:  Surgery to replace your aortic valve. This is the most common treatment for aortic valve stenosis, and it is the only treatment to cure the condition. Several types of surgeries are available. The surgery may be done: ? Through a large incision over your heart (open-heart surgery). ? Through small incisions, using a flexible tube called a catheter (  transcatheter aortic valve replacement, TAVR).  Medicines that help to keep your heart rate regular.  Medicines that thin your blood (anticoagulants) to prevent blood clots.  Antibiotic medicines to help prevent infection. If your condition is mild, you may only need regular follow-up visits for monitoring. Follow these instructions at  home: Lifestyle  Limit alcohol intake to no more than 1 drink a day for nonpregnant women and 2 drinks a day for men. One drink equals 12 oz of beer, 5 oz of wine, or 1 oz of hard liquor.  Do not use any products that contain nicotine or tobacco, such as cigarettes and e-cigarettes. If you need help quitting, ask your health care provider.  Work with your health care provider to manage your blood pressure and cholesterol.  Maintain a healthy weight. Eating and drinking   Eat a heart-healthy diet that includes plenty of fresh fruits and vegetables, whole grains, lean protein, and low-fat or nonfat dairy.  Limit how much caffeine you drink. Caffeine can affect your heart's rate and rhythm.  Avoid foods that are: ? High in salt (sodium), saturated fat, or sugar. ? Canned or highly processed. ? Fried.  Follow instructions from your health care provider about any other eating or drinking restrictions. Activity  Exercise regularly and return to your normal activities as told by your health care provider. Ask your health care provider what amount and type of physical activity is safe for you. ? If your aortic valve stenosis is mild, you may only need to avoid very intense physical activity, such as heavy weight lifting. ? The more severe your aortic valve stenosis is, the more activities you may need to avoid. If you are taking blood thinners:  Before you take any medicines that contain aspirin or NSAIDs, talk with your health care provider. These medicines increase your risk for dangerous bleeding.  Take your medicine exactly as told, at the same time every day.  Avoid activities that could cause injury or bruising, and follow instructions about how to prevent falls.  Wear a medical alert bracelet or carry a card that lists what medicines you take. General instructions  Take over-the-counter and prescription medicines only as told by your health care provider.  If you were  prescribed an antibiotic, take it as told by your health care provider. Do not stop taking the antibiotic even if you start to feel better.  If you are a woman and you plan to become pregnant, talk with your health care provider before you become pregnant.  Before you have any type of medical or dental procedure or surgery, tell all health care providers that you have aortic valve stenosis. This may affect the treatment that you receive.  Keep all follow-up visits as told by your health care provider. This is important. Contact a health care provider if:  You have a fever. Get help right away if:  You develop any of the following symptoms: ? Chest pain. ? Chest tightness. ? Shortness of breath. ? Trouble breathing.  You feel light-headed.  You feel like you might faint.  Your heartbeat is irregular or faster than normal. These symptoms may represent a serious problem that is an emergency. Do not wait to see if the symptoms will go away. Get medical help right away. Call your local emergency services (911 in the [place] Do not drive yourself to the hospital. Summary  Aortic valve stenosis is a narrowing of the aortic valve in the heart. The aortic valve opens and  closes to regulate blood flow between the left side of the heart (left ventricle) and the artery that leads away from the heart (aorta).  Aortic valve stenosis can range from mild to severe. If it is not treated, it can become more severe over time and lead to heart failure.  Treatment depends on how severe your condition is and what your symptoms are. You will need to have your heart checked regularly to make sure that your condition is not getting worse or causing serious problems.  Exercise regularly and return to your normal activities as told by your health care provider. Ask your health care provider what amount and type of physical activity is safe for you. This information is not intended to replace advice given to you  by your health care provider. Make sure you discuss any questions you have with your health care provider. Document Revised: 09/15/2017 Document Reviewed: 07/06/2017 Elsevier Patient Education  2020 [employer].   Shortness of Breath, Adult Shortness of breath means you have trouble breathing. Shortness of breath could be a sign of a medical problem. Follow these instructions at home:   Watch for any changes in your symptoms.  Do not use any products that contain nicotine or tobacco, such as cigarettes, e-cigarettes, and chewing tobacco.  Do not smoke. Smoking can cause shortness of breath. If you need help to quit smoking, ask your doctor.  Avoid things that can make it harder to breathe, such as: ? Mold. ? Dust. ? Air pollution. ? Chemical smells. ? Things that can cause allergy symptoms (allergens), if you have allergies.  Keep your living space clean. Use products that help remove mold and dust.  Rest as needed. Slowly return to your normal activities.  Take over-the-counter and prescription medicines only as told by your doctor. This includes oxygen therapy and inhaled medicines.  Keep all follow-up visits as told by your doctor. This is important. Contact a doctor if:  Your condition does not get better as soon as expected.  You have a hard time doing your normal activities, even after you rest.  You have new symptoms. Get help right away if:  Your shortness of breath gets worse.  You have trouble breathing when you are resting.  You feel light-headed or you pass out (faint).  You have a cough that is not helped by medicines.  You cough up blood.  You have pain with breathing.  You have pain in your chest, arms, shoulders, or belly (abdomen).  You have a fever.  You cannot walk up stairs.  You cannot exercise the way you normally do. These symptoms may represent a serious problem that is an emergency. Do not wait to see if the symptoms will go away.  Get medical help right away. Call your local emergency services (911 in the [place] Do not drive yourself to the hospital. Summary  Shortness of breath is when you have trouble breathing enough air. It can be a sign of a medical problem.  Avoid things that make it hard for you to breathe, such as smoking, pollution, mold, and dust.  Watch for any changes in your symptoms. Contact your doctor if you do not get better or you get worse. This information is not intended to replace advice given to you by your health care provider. Make sure you discuss any questions you have with your health care provider. Document Revised: 03/05/2018 Document Reviewed: 03/05/2018 Elsevier Patient Education  2020 [employer]   Heart Attack A  heart attack occurs when blood and oxygen supply to the heart is cut off. A heart attack causes damage to the heart that cannot be fixed. A heart attack is also called a myocardial infarction, or MI. If you think you are having a heart attack, do not wait to see if the symptoms will go away. Get medical help right away. What are the causes? This condition may be caused by:  A fatty substance (plaque) in the blood vessels (arteries). This can block the flow of blood to the heart.  A blood clot in the blood vessels that go to the heart. The blood clot blocks blood flow.  Low blood pressure.  An abnormal heartbeat.  Some diseases, such as problems in red blood cells (anemia)orproblems in breathing (respiratory failure).  Tightening (spasm) of a blood vessel that cuts off blood to the heart.  A tear in a blood vessel of the heart.  High blood pressure. What increases the risk? The following factors may make you more likely to develop this condition:  Aging. The older you are, the higher your risk.  Having a personal or family history of chest pain, heart attack, stroke, or narrowing of the arteries in the legs, arms, head, or stomach (peripheral artery  disease).  Being male.  Smoking.  Not getting regular exercise.  Being overweight or obese.  Having high blood pressure.  Having high cholesterol.  Having diabetes.  Drinking too much alcohol.  Using illegal drugs, such as cocaine or methamphetamine. What are the signs or symptoms? Symptoms of this condition include:  Chest pain. It may feel like: ? Crushing or squeezing. ? Tightness, pressure, fullness, or heaviness.  Pain in the arm, neck, jaw, back, or upper body.  Shortness of breath.  Heartburn.  Upset stomach (indigestion).  Feeling like you may vomit (nauseous).  Cold sweats.  Feeling tired.  Sudden light-headedness. How is this treated? A heart attack must be treated as soon as possible. Treatment may include:  Medicines to: ? Break up or dissolve blood clots. ? Thin blood and help prevent blood clots. ? Treat blood pressure. ? Improve blood flow to the heart. ? Reduce pain. ? Reduce cholesterol.  Procedures to widen a blocked artery and keep it open.  Open heart surgery.  Receiving oxygen.  Making your heart strong again (cardiac rehabilitation) through exercise, education, and counseling. Follow these instructions at home: Medicines  Take over-the-counter and prescription medicines only as told by your doctor. You may need to take medicine: ? To keep your blood from clotting too easily. ? To control blood pressure. ? To lower cholesterol. ? To control heart rhythms.  Do not take these medicines unless your doctor says it is okay: ? NSAIDs, such as ibuprofen. ? Supplements that have vitamin A, vitamin E, or both. ? Hormone replacement therapy that has estrogen with or without progestin. Lifestyle      Do not use any products that have nicotine or tobacco, such as cigarettes, e-cigarettes, and chewing tobacco. If you need help quitting, ask your doctor.  Avoid secondhand smoke.  Exercise regularly. Ask your doctor about a  cardiac rehab program.  Eat heart-healthy foods. Your doctor will tell you what foods to eat.  Stay at a healthy weight.  Lower your stress level.  Do not use illegal drugs. Alcohol use  Do not drink alcohol if: ? Your doctor tells you not to drink. ? You are pregnant, may be pregnant, or are planning to become pregnant.  If  you drink alcohol: ? Limit how much you use to:  0-1 drink a day for women.  0-2 drinks a day for men. ? Know how much alcohol is in your drink. In the [place] one drink equals one 12 oz bottle of beer (355 mL), one 5 oz glass of wine (148 mL), or one 1 oz glass of hard liquor (44 mL). General instructions  Work with your doctor to treat other problems you may have, such as diabetes or high blood pressure.  Get screened for depression. Get treatment if needed.  Keep your vaccines up to date. Get the flu shot (influenza vaccine) every year.  Keep all follow-up visits as told by your doctor. This is important. Contact a doctor if:  You feel very sad.  You have trouble doing your daily activities. Get help right away if:  You have sudden, unexplained discomfort in your chest, arms, back, neck, jaw, or upper body.  You have shortness of breath.  You have sudden sweating or clammy skin.  You feel like you may vomit.  You vomit.  You feel tired or weak.  You get light-headed or dizzy.  You feel your heart beating fast.  You feel your heart skipping beats.  You have blood pressure that is higher than 180/120. These symptoms may be an emergency. Do not wait to see if the symptoms will go away. Get medical help right away. Call your local emergency services (911 in the [place] Do not drive yourself to the hospital. Summary  A heart attack occurs when blood and oxygen supply to the heart is cut off.  Do not take NSAIDs unless your doctor says it is okay.  Do not smoke. Avoid secondhand smoke.  Exercise regularly. Ask your doctor about a  cardiac rehab program. This information is not intended to replace advice given to you by your health care provider. Make sure you discuss any questions you have with your health care provider. Document Revised: 01/14/2019 Document Reviewed: 01/14/2019 Elsevier Patient Education  2020 [employer].

## 2020-08-23 NOTE — Progress Notes (Signed)
Discharge packet was discussed with the patient including the medications he will be taking at home. The paper prescription for metoprolol was placed in the discharge packet.   All questions were answered at this time.

## 2020-08-23 NOTE — Discharge Summary (Signed)
Physician Discharge Summary  Olman Yono BJY:[id number] DOB: 1956/09/23 DOA: 08/15/2020  PCP: Pcp, No  Admit date: 08/15/2020 Discharge date: 08/23/2020  Admitted From: Home Disposition:  Home  Recommendations for Outpatient Follow-up:  1. Follow up with Cards in 1-2 weeks 2. Please obtain BMP/CBC in one week. 3. For TAVR's on 09/01/2020  Home Health:No Equipment/Devices:None  Discharge Condition:Stable CODE STATUS:Full Diet recommendation: Heart Healthy  Brief/Interim Summary:  64 y.o. male past medical history significant for obesity, essential hypertension diabetes mellitus type 2 presents to [facility] with shortness of breath for 1 week.  As per patient his Lasix dose was reduced about a month earlier.  CT was concerning for pneumonia, he also had elevated troponins he was placed IV heparin Rocephin and azithromycin and transferred to [facility], the echo done on August 17, 2020 shows severe aortic stenosis with severe LVH  Discharge Diagnoses:  Principal Problem:   Acute respiratory failure with hypoxia ([place]) Active Problems:   NSTEMI (non-ST elevated myocardial infarction) ([place])   CKD (chronic kidney disease) stage 3, GFR 30-59 ml/min (HCC)   OSA on CPAP   Obesity, Class III, BMI 40-49.9 (morbid obesity) (HCC)   SOB (shortness of breath)   Severe aortic stenosis   Nonrheumatic mitral valve stenosis   AKI (acute kidney injury) ([place])  Acute respiratory failure with hypoxia due to severe aortic stenosis leading to pulmonary edema: 2D, echo transesophageal echo showed a calcified aortic valve with a mean gradient of 42 mmHg and CTAwith left heart cath and right heart cath showed severe aortic stenosis for shortness of breath. Cardiology and CT surgery were consulted during this admission. They want to proceed with TAVR as an outpatient 09/01/2020. His diuretic and ACE inhibitor were held due to contrast nephropathy which will be resuming  08/26/2020. Continue aspirin statins and beta-blockers.  Acute kidney injury on chronic kidney disease stage III a contrast-induced nephropathy: With a baseline creatinine of 1.3, diuretic and ACE inhibitor were held. His creatinine is returning to baseline on the day of discharge is 1.4 he will resume his diuretic and ACE inhibitor as an outpatient.  Elevated troponins: Demand ischemia in the setting of aortic stenosis continue aspirin statins.  Low TSH: Question sick euthyroid syndrome will need to be checked as an outpatient in 6-week with a free T4 and TSH.  Chronic lower extremity edema: Resolved.  Controlled diabetes mellitus type 2: His Metformin was held during his admission, his A1c is 5.5.  He can resume as an outpatient.  Normocytic anemia/anemia of chronic disease: Noted remained stable.  Essential hypertension: Fairly controlled on metoprolol and Cardizem, continue to hold ACE and diuretic as an outpatient on ~10 he can resume them as an outpatient.  Discharge Instructions  Discharge Instructions    Diet - low sodium heart healthy   Complete by: As directed    Increase activity slowly   Complete by: As directed      Allergies as of 08/23/2020      Reactions   Cocoa Hives   Sulfamethoxazole-trimethoprim Anaphylaxis   Chocolate Hives   Citrus Hives   Reaction to oranges and orange juice   Fish Allergy Hives, Swelling   Throat swelling   Shellfish Allergy Hives, Swelling   Throat swelling   Sulfa Antibiotics Hives, Swelling   Throat swelling   Tetracycline Swelling      Medication List    STOP taking these medications   cloNIDine 0.1 MG tablet Commonly known as: CATAPRES   diltiazem  240 MG 24 hr capsule Commonly known as: CARDIZEM CD   diphenhydrAMINE 25 MG tablet Commonly known as: BENADRYL   folic acid 1 MG tablet Commonly known as: FOLVITE     TAKE these medications   albuterol 108 (90 Base) MCG/ACT inhaler Commonly known as: VENTOLIN  HFA Inhale 2 puffs into the lungs every 4 (four) hours as needed for wheezing or shortness of breath.   aspirin EC 81 MG tablet Take 81 mg by mouth at bedtime. Swallow whole.   Cholecalciferol 25 MCG (1000 UT) tablet Take 1,000 Units by mouth at bedtime.   Fifty50 Glucose Meter 2.0 w/Device Kit See admin instructions.   furosemide 40 MG tablet Commonly known as: LASIX Take 0.5 tablets (20 mg total) by mouth daily. Start taking on: August 26, 2020 What changed: These instructions start on August 26, 2020. If you are unsure what to do until then, ask your doctor or other care provider.   gabapentin 300 MG capsule Commonly known as: NEURONTIN Take 600 mg by mouth 2 (two) times daily.   HYDROcodone-acetaminophen 10-325 MG tablet Commonly known as: NORCO Take 1 tablet by mouth every 6 (six) hours as needed (pain).   latanoprost 0.005 % ophthalmic solution Commonly known as: XALATAN Place 1 drop into both eyes at bedtime.   lisinopril 40 MG tablet Commonly known as: ZESTRIL Take 1 tablet (40 mg total) by mouth at bedtime. Start taking on: August 26, 2020 What changed: These instructions start on August 26, 2020. If you are unsure what to do until then, ask your doctor or other care provider.   metFORMIN 1000 MG tablet Commonly known as: GLUCOPHAGE Take 1,000 mg by mouth 2 (two) times daily.   metoprolol tartrate 25 MG tablet Commonly known as: LOPRESSOR Take 1 tablet (25 mg total) by mouth 2 (two) times daily.   multivitamin with minerals Tabs tablet Take 1 tablet by mouth at bedtime.   pantoprazole 40 MG tablet Commonly known as: PROTONIX Take 40 mg by mouth 2 (two) times daily.   potassium chloride SA 20 MEQ tablet Commonly known as: KLOR-CON Take 1 tablet (20 mEq total) by mouth daily. Start taking on: August 26, 2020 What changed: These instructions start on August 26, 2020. If you are unsure what to do until then, ask your doctor or other care  provider.   PRESCRIPTION MEDICATION Inhale into the lungs at bedtime. CPAP   rosuvastatin 20 MG tablet Commonly known as: CRESTOR Take 20 mg by mouth daily.   Xtampza ER 9 MG C12a Generic drug: oxyCODONE ER Take 9 mg by mouth every 12 (twelve) hours.       Allergies  Allergen Reactions  . Cocoa Hives  . Sulfamethoxazole-Trimethoprim Anaphylaxis  . Chocolate Hives  . Citrus Hives    Reaction to oranges and orange juice  . Fish Allergy Hives and Swelling    Throat swelling  . Shellfish Allergy Hives and Swelling    Throat swelling  . Sulfa Antibiotics Hives and Swelling    Throat swelling  . Tetracycline Swelling    Consultations:  Cardiology  CT surgery   Procedures/Studies: CT Angio Chest PE W and/or Wo Contrast  Result Date: 08/15/2020 CLINICAL DATA:  Shortness of breath EXAM: CT ANGIOGRAPHY CHEST WITH CONTRAST TECHNIQUE: Multidetector CT imaging of the chest was performed using the standard protocol during bolus administration of intravenous contrast. Multiplanar CT image reconstructions and MIPs were obtained to evaluate the vascular anatomy. CONTRAST:  140m OMNIPAQUE IOHEXOL 350 MG/ML SOLN COMPARISON:  None. FINDINGS: Cardiovascular: No filling defects in the pulmonary arteries to suggest pulmonary emboli. Aortic atherosclerosis. Calcifications of the aortic valve. Heart is normal size. Mediastinum/Nodes: Scattered small mediastinal lymph nodes, none pathologically enlarged. Trachea and esophagus are unremarkable. Thyroid unremarkable. Lungs/Pleura: Small bilateral pleural effusions. Compressive atelectasis in the lower lobes. Patchy bilateral ground-glass airspace opacities most notable in the upper lobes concerning for pneumonia. Upper Abdomen: Imaging into the upper abdomen demonstrates no acute findings. Musculoskeletal: Chest wall soft tissues are unremarkable. No acute bony abnormality. Review of the MIP images confirms the above findings. IMPRESSION: No evidence  of pulmonary embolus. Small bilateral pleural effusions. Compressive atelectasis in the lower lobes. Patchy bilateral ground-glass opacities in the upper lobes concerning for pneumonia. Aortic Atherosclerosis (ICD10-I70.0). Electronically Signed   By: Rolm Baptise M.D.   On: 08/15/2020 19:54   CARDIAC CATHETERIZATION  Result Date: 08/18/2020  Widely patent coronary arteries  High cardiac output: 9.06 L/min by Fick and 9.23 L/min by thermal dilution.  Etiology uncertain.  Suppressed TSH with normal T4 raising question of T3 thyrotoxicosis versus other mechanism.  Severe pulmonary hypertension, mean pressure 48 mmHg, based upon hemodynamics WHO group 2  Pulmonary capillary wedge pressure mean 34 mmHg, pulmonary vascular resistance 1.55 Woods units.  Mean right atrial pressure 19 mmHg  Aortic valve gradient peak to peak 33 mmHg with mean gradient 22 mmHg.  Aortic valve area 1.94 cm by Fick cardiac output and 1.98 cm by thermodilution  LV function normal by echocardiography with moderate LVH RECOMMENDATIONS:  Further work-up/management per treating team.  Needs further diuresis.  T3 level  CT CORONARY MORPH W/CTA COR W/SCORE W/CA W/CM &/OR WO/CM  Addendum Date: 08/20/2020   ADDENDUM REPORT: 08/20/2020 08:01 EXAM: OVER-READ INTERPRETATION  CT CHEST The following report is an over-read performed by radiologist Dr. Rebekah Chesterfield [facility] on 08/20/2020. This over-read does not include interpretation of cardiac or coronary anatomy or pathology. The coronary calcium score and cardiac CTA interpretation by the cardiologist is attached. COMPARISON:  Chest CTA 08/15/2020. FINDINGS: Extracardiac findings will be described separately under dictation for contemporaneously obtained CTA chest, abdomen and pelvis. IMPRESSION: Please see separate dictation for contemporaneously obtained CTA chest, abdomen and pelvis 08/19/2020 for full description of relevant extracardiac findings. Electronically  Signed   By: Vinnie Langton M.D.   On: 08/20/2020 08:01   Result Date: 08/20/2020 MEDICATIONS: MEDICATIONS None EXAM: Cardiac TAVR CT TECHNIQUE: The patient was scanned on a Siemens Force 465 slice scanner. A 120 kV retrospective scan was triggered in the descending thoracic aorta at 111 HU's. Gantry rotation speed was 270 msecs and collimation was .9 mm. No beta blockade or nitro were given. The 3D data set was reconstructed in 5% intervals of the R-R cycle. Systolic and diastolic phases were analyzed on a dedicated work station using MPR, MIP and VRT modes. The patient received 80 cc of contrast. FINDINGS: Aortic Valve: Calcium score 1645 Tri leaflet with restricted motion Aorta: No aneurysm moderate calcific atherosclerosis normal arch vessels Sino-tubular Junction: 25 mm Ascending Thoracic Aorta: 33 mm Aortic Arch: 25 mm Descending Thoracic Aorta: 25 mm Sinus of Valsalva Measurements: Non-coronary: 30.8 mm Right - coronary: 28.2 mm Left - coronary: 31.3 mm Coronary Artery Height above Annulus: Left Main: 11.1 mm above annulus Right Coronary: 15.3 mm above annulus Virtual Basal Annulus Measurements: Maximum/Minimum Diameter: 27.4 mm x 22.8 mm Perimeter: 82 mm Area: 505 mm2 Coronary Arteries: Sufficient height above annulus for deployment Optimum Fluoroscopic Angle for Delivery: LAO 3 Caudal  6 degrees IMPRESSION: 1. Tri-leaflet AV with calcium score 1645 and annular area of 505 mm2 suitable for a 26 mm Sapien 3 valve 2.  Coronary arteries sufficient height above annulus for deployment 3.  Optimum angiographic angle for deployment LAO 3 Caudal 6 degrees 4. Some nodular calcification in annulus at base of left coronary cusp 5.  Normal aortic root 3.3 cm Jenkins Rouge Electronically Signed: By: Jenkins Rouge M.D. On: 08/19/2020 18:00   DG Chest Port 1 View  Result Date: 08/15/2020 CLINICAL DATA:  Shortness of breath with exertion for 1 week. Dry cough. O2 sats 87%. EXAM: PORTABLE CHEST 1 VIEW COMPARISON:   06/08/2014 FINDINGS: Heart size is accentuated by portable technique. There are faint opacities at the lung bases bilaterally, consistent with early infiltrates. No pulmonary edema. IMPRESSION: Suspect bibasilar infiltrates. Electronically Signed   By: Nolon Nations M.D.   On: 08/15/2020 17:44   CT ANGIO CHEST AORTA W/CM & OR WO/CM  Result Date: 08/20/2020 CLINICAL DATA:  64 year old male with history of severe aortic stenosis. Preprocedural study prior to potential transcatheter aortic valve replacement (TAVR) procedure. EXAM: CT ANGIOGRAPHY CHEST, ABDOMEN AND PELVIS TECHNIQUE: Non-contrast CT of the chest was initially obtained. Multidetector CT imaging through the chest, abdomen and pelvis was performed using the standard protocol during bolus administration of intravenous contrast. Multiplanar reconstructed images and MIPs were obtained and reviewed to evaluate the vascular anatomy. CONTRAST:  148m OMNIPAQUE IOHEXOL 350 MG/ML SOLN COMPARISON:  None. FINDINGS: CTA CHEST FINDINGS Cardiovascular: Heart size is mildly enlarged. There is no significant pericardial fluid, thickening or pericardial calcification. There is aortic atherosclerosis, as well as atherosclerosis of the great vessels of the mediastinum and the coronary arteries, including calcified atherosclerotic plaque in the left anterior descending, left circumflex and right coronary arteries. Severe thickening calcification of the aortic valve. Mild calcifications of the mitral annulus. Mediastinum/Nodes: No pathologically enlarged mediastinal or hilar lymph nodes. Esophagus is unremarkable in appearance. No axillary lymphadenopathy. Lungs/Pleura: Patchy areas of ground-glass attenuation and mild interlobular septal thickening noted throughout the lungs bilaterally, favored to reflect a background of mild interstitial pulmonary edema. No confluent consolidative airspace disease. Moderate right and small left pleural effusions lying dependently  with some associated passive subsegmental atelectasis in the lower lobes of the lungs bilaterally. Mild scarring in the medial aspect of the right upper lobe and inferior segment of the lingula. Musculoskeletal: There are no aggressive appearing lytic or blastic lesions noted in the visualized portions of the skeleton. CTA ABDOMEN AND PELVIS FINDINGS Hepatobiliary: Diffuse low attenuation throughout the hepatic parenchyma, indicative of a background of hepatic steatosis. In the inferior aspect of segment 5 of the liver (axial image 134 of series 4) there is a small hypovascular lesion measuring 1.3 cm in diameter (44 HU), which is indeterminate. No other larger more suspicious appearing hepatic lesions. No intra or extrahepatic biliary ductal dilatation. Gallbladder is normal in appearance. Pancreas: No pancreatic mass. No pancreatic ductal dilatation. No pancreatic or peripancreatic fluid collections or inflammatory changes. Spleen: Unremarkable. Adrenals/Urinary Tract: Subcentimeter low-attenuation lesions in the right kidney, too small to characterize, but statistically likely to represent cysts. Left kidney and bilateral adrenal glands are normal in appearance. No hydroureteronephrosis. Urinary bladder is normal in appearance. Stomach/Bowel: The appearance of the stomach is normal. No pathologic dilatation of small bowel or colon. Normal appendix. Vascular/Lymphatic: Aortic atherosclerosis, with vascular findings and measurements pertinent to potential TAVR procedure, as detailed below. No aneurysm or dissection noted in the abdominal or pelvic vasculature.  No lymphadenopathy noted in the abdomen or pelvis. Reproductive: Prostate gland and seminal vesicles are unremarkable in appearance. Other: Moderate-sized umbilical hernia containing only omental fat. Left inguinal hernia containing predominantly fat. No significant volume of ascites. No pneumoperitoneum. Musculoskeletal: There are no aggressive appearing  lytic or blastic lesions noted in the visualized portions of the skeleton. VASCULAR MEASUREMENTS PERTINENT TO TAVR: AORTA: Minimal Aortic Diameter-13 x 12 mm Severity of Aortic Calcification-moderate to severe RIGHT PELVIS: Right Common Iliac Artery - Minimal Diameter-7.3 x 7.8 mm Tortuosity - mild Calcification-moderate Right External Iliac Artery - Minimal Diameter-8.1 x 4.2 mm Tortuosity - mild Calcification-mild Right Common Femoral Artery - Minimal Diameter-7.9 x 7.6 mm Tortuosity - mild Calcification-mild LEFT PELVIS: Left Common Iliac Artery - Minimal Diameter-7.4 x 7.8 mm Tortuosity - mild Calcification-moderate Left External Iliac Artery - Minimal Diameter-7.7 x 5.8 mm Tortuosity - mild Calcification-none Left Common Femoral Artery - Minimal Diameter-8.2 x 8.2 mm Tortuosity-mild Calcification-minimal Review of the MIP images confirms the above findings. IMPRESSION: 1. Vascular findings and measurements pertinent to potential TAVR procedure, as detailed above. 2. Severe thickening calcification of the aortic valve, compatible with the reported clinical history of severe aortic stenosis. 3. Aortic atherosclerosis, in addition to three vessel coronary artery disease. Please note that although the presence of coronary artery calcium documents the presence of coronary artery disease, the severity of this disease and any potential stenosis cannot be assessed on this non-gated CT examination. Assessment for potential risk factor modification, dietary therapy or pharmacologic therapy may be warranted, if clinically indicated. 4. Cardiomegaly with evidence of interstitial pulmonary edema and bilateral pleural effusions (right greater than left); imaging findings suggestive of congestive heart failure. 5. Hepatic steatosis. 6. 1.3 cm indeterminate hypovascular lesion in segment 5 of the liver. Correlation with nonemergent abdominal MRI with and without IV gadolinium is recommended in the near future to definitively  characterize this lesion and exclude neoplasm. 7. Additional incidental findings, as above. Electronically Signed   By: Vinnie Langton M.D.   On: 08/20/2020 08:53   ECHOCARDIOGRAM COMPLETE  Result Date: 08/17/2020    ECHOCARDIOGRAM REPORT   Patient Name:   ORDELL PRICHETT Date of Exam: 08/17/2020 Medical Rec #:  [id number]      Height:       71.0 in Accession #:    [id number]     Weight:       310.3 lb Date of Birth:  06-12-1956      BSA:          2.541 m Patient Age:    64 years       BP:           138/83 mmHg Patient Gender: M              HR:           97 bpm. Exam Location:  Inpatient Procedure: 2D Echo, Color Doppler and Cardiac Doppler Indications:    NSTEMI  History:        Patient has prior history of Echocardiogram examinations, most                 recent 08/10/2018. Risk Factors:Hypertension and Diabetes. Prior                 performed at [facility]:    [facility] Referring Phys: [id number] [facility]  1. Left ventricular ejection fraction, by estimation, is 60 to 65%. The left ventricle has normal function. The left ventricle  has no regional wall motion abnormalities. There is moderate concentric left ventricular hypertrophy. Left ventricular diastolic parameters are indeterminate.  2. Right ventricular systolic function is normal. The right ventricular size is normal.  3. Left atrial size was severely dilated.  4. The mitral valve is normal in structure. Trivial mitral valve regurgitation. The posterior leaflet appears to have limited mobility with mild flow acceleration noted across the mitral valve with mean gradient 81mHg at HR 101. There is mild narrowing of the mitral valve orfice without significant stenosis.  5. The aortic valve is tricuspid. There is severe calcifcation of the aortic valve. There is severe thickening of the aortic valve. Aortic valve regurgitation is not visualized. Severe aortic valve stenosis with AVA 0.9 (by continuity), mean gradient  573mg, peak gradient 8221m, Vmax 4.65cm2, DOI 0.2.  6. The inferior vena cava is dilated in size with >50% respiratory variability, suggesting right atrial pressure of 8 mmHg. FINDINGS  Left Ventricle: Left ventricular ejection fraction, by estimation, is 60 to 65%. The left ventricle has normal function. The left ventricle has no regional wall motion abnormalities. The left ventricular internal cavity size was normal in size. There is  moderate concentric left ventricular hypertrophy. Left ventricular diastolic parameters are indeterminate. Right Ventricle: The right ventricular size is normal. Right vetricular wall thickness was not well visualized. Right ventricular systolic function is normal. Left Atrium: Left atrial size was severely dilated. Right Atrium: Right atrial size was normal in size. Pericardium: There is no evidence of pericardial effusion. Mitral Valve: The mitral valve is normal in structure. There is mild thickening of the mitral valve leaflet(s). Trivial mitral valve regurgitation. The posterior leaflet appears to have limited mobility with mild flow acceleration noted across the mitral  valve with mean gradient 6mm85mat HR 101. There is mild narrowing fo the mitral valve orfice without significant stenosis. MV peak gradient, 10.1 mmHg. The mean mitral valve gradient is 6.0 mmHg. Tricuspid Valve: The tricuspid valve is normal in structure. Tricuspid valve regurgitation is trivial. Aortic Valve: The aortic valve is tricuspid. There is severe calcifcation of the aortic valve. There is severe thickening of the aortic valve. Aortic valve regurgitation is not visualized. Severe aortic stenosis is present. Aortic valve mean gradient measures 51.0 mmHg. Aortic valve peak gradient measures 83.2 mmHg. Aortic valve area, by VTI measures 0.89 cm. DOI 0.2. Pulmonic Valve: The pulmonic valve was normal in structure. Pulmonic valve regurgitation is trivial. Aorta: The aortic root and ascending aorta are  structurally normal, with no evidence of dilitation. Venous: The inferior vena cava is dilated in size with greater than 50% respiratory variability, suggesting right atrial pressure of 8 mmHg. IAS/Shunts: No atrial level shunt detected by color flow Doppler.  LEFT VENTRICLE PLAX 2D LVIDd:         4.29 cm  Diastology LVIDs:         2.46 cm  LV e' medial:  8.05 cm/s LV PW:         1.61 cm  LV e' lateral: 10.40 cm/s LV IVS:        1.24 cm LVOT diam:     2.40 cm LV SV:         90 LV SV Index:   36 LVOT Area:     4.52 cm  RIGHT VENTRICLE RV S prime:     10.30 cm/s TAPSE (M-mode): 2.0 cm LEFT ATRIUM              Index  RIGHT ATRIUM           Index LA diam:        3.70 cm  1.46 cm/m  RA Area:     23.50 cm LA Vol (A2C):   108.0 ml 42.50 ml/m RA Volume:   75.60 ml  29.75 ml/m LA Vol (A4C):   123.0 ml 48.40 ml/m LA Biplane Vol: 120.0 ml 47.22 ml/m  AORTIC VALVE AV Area (Vmax):    0.97 cm AV Area (Vmean):   1.18 cm AV Area (VTI):     0.89 cm AV Vmax:           456.00 cm/s AV Vmean:          268.000 cm/s AV VTI:            1.017 m AV Peak Grad:      83.2 mmHg AV Mean Grad:      51.0 mmHg LVOT Vmax:         97.50 cm/s LVOT Vmean:        70.000 cm/s LVOT VTI:          0.200 m LVOT/AV VTI ratio: 0.20  AORTA Ao Root diam: 3.20 cm Ao Asc diam:  3.40 cm MITRAL VALVE MV Peak grad: 10.1 mmHg  SHUNTS MV Mean grad: 6.0 mmHg   Systemic VTI:  0.20 m MV Vmax:      1.59 m/s   Systemic Diam: 2.40 cm MV Vmean:     117.0 cm/s Gwyndolyn Kaufman MD Electronically signed by Gwyndolyn Kaufman MD Signature Date/Time: 08/17/2020/12:41:56 PM    Final    ECHO TEE  Result Date: 08/21/2020    TRANSESOPHOGEAL ECHO REPORT   Patient Name:   MARKES SHATSWELL Date of Exam: 08/21/2020 Medical Rec #:  [id number]      Height:       71.0 in Accession #:    [id number]     Weight:       303.2 lb Date of Birth:  1956-08-13      BSA:          2.516 m Patient Age:    89 years       BP:           149/75 mmHg Patient Gender: M              HR:            87 bpm. Exam Location:  Inpatient Procedure: Transesophageal Echo, Cardiac Doppler and Color Doppler Indications:     I35.0 Nonrheumatic aortic (valve) stenosis  History:         Patient has prior history of Echocardiogram examinations, most                  recent 08/17/2020. Previous Myocardial Infarction and CAD,                  Aortic Valve Disease; Risk Factors:Sleep Apnea. Severe aortic                  stenosis.  Sonographer:     Roseanna Rainbow RDCS Referring Phys:  [id number] Woodfin Ganja THOMPSON Diagnosing Phys: Kirk Ruths MD PROCEDURE: After discussion of the risks and benefits of a TEE, an informed consent was obtained from the patient. The transesophogeal probe was passed without difficulty through the esophogus of the patient. Imaged were obtained with the patient in a left lateral decubitus position. Sedation performed by different physician. The patient was monitored while under deep  sedation. Anesthestetic sedation was provided intravenously by Anesthesiology: 334m of Propofol. The patient's vital signs; including heart rate, blood pressure, and oxygen saturation; remained stable throughout the procedure. The patient developed no complications during the procedure. IMPRESSIONS  1. Severe AS (mean gradient 42 mmHg and peak velocity of 4.2 m/s); mild AI.  2. Left ventricular ejection fraction, by estimation, is 70 to 75%. The left ventricle has hyperdynamic function. There is severe left ventricular hypertrophy.  3. Right ventricular systolic function is normal. The right ventricular size is normal.  4. Left atrial size was moderately dilated. No left atrial/left atrial appendage thrombus was detected.  5. The mitral valve is normal in structure. Trivial mitral valve regurgitation.  6. The aortic valve is tricuspid. Aortic valve regurgitation is mild. Severe aortic valve stenosis.  7. There is Moderate (Grade III) plaque involving the descending aorta. FINDINGS  Left Ventricle: Left ventricular ejection  fraction, by estimation, is 70 to 75%. The left ventricle has hyperdynamic function. The left ventricular internal cavity size was normal in size. There is severe left ventricular hypertrophy. Right Ventricle: The right ventricular size is normal. Right vetricular wall thickness was not assessed. Right ventricular systolic function is normal. Left Atrium: Left atrial size was moderately dilated. No left atrial/left atrial appendage thrombus was detected. Right Atrium: Right atrial size was normal in size. Pericardium: There is no evidence of pericardial effusion. Mitral Valve: The mitral valve is normal in structure. Mild mitral annular calcification. Trivial mitral valve regurgitation. MV peak gradient, 5.8 mmHg. The mean mitral valve gradient is 2.0 mmHg. Tricuspid Valve: The tricuspid valve is normal in structure. Tricuspid valve regurgitation is mild. Aortic Valve: The aortic valve is tricuspid. Aortic valve regurgitation is mild. Severe aortic stenosis is present. Aortic valve mean gradient measures 41.5 mmHg. Aortic valve peak gradient measures 66.7 mmHg. Aortic valve area, by VTI measures 1.04 cm. Pulmonic Valve: The pulmonic valve was normal in structure. Pulmonic valve regurgitation is not visualized. Aorta: The aortic root is normal in size and structure. There is moderate (Grade III) plaque involving the descending aorta. IAS/Shunts: No atrial level shunt detected by color flow Doppler. Additional Comments: Severe AS (mean gradient 42 mmHg and peak velocity of 4.2 m/s); mild AI.  LEFT VENTRICLE PLAX 2D LVOT diam:     2.20 cm LV SV:         81 LV SV Index:   32 LVOT Area:     3.80 cm  AORTIC VALVE AV Area (Vmax):    1.01 cm AV Area (Vmean):   0.92 cm AV Area (VTI):     1.04 cm AV Vmax:           408.50 cm/s AV Vmean:          307.500 cm/s AV VTI:            0.778 m AV Peak Grad:      66.7 mmHg AV Mean Grad:      41.5 mmHg LVOT Vmax:         108.00 cm/s LVOT Vmean:        74.800 cm/s LVOT VTI:           0.214 m LVOT/AV VTI ratio: 0.27 MITRAL VALVE MV Peak grad: 5.8 mmHg  SHUNTS MV Mean grad: 2.0 mmHg  Systemic VTI:  0.21 m MV Vmax:      1.20 m/s  Systemic Diam: 2.20 cm MV Vmean:     60.7 cm/s BKirk RuthsMD Electronically signed by BAaron Edelman  Crenshaw MD Signature Date/Time: 08/21/2020/9:21:38 AM    Final    VAS US DOPPLER PRE CABG  Result Date: 08/19/2020 PREOPERATIVE VASCULAR EVALUATION  Indications:  Pre-CABG. Risk Factors: Hypertension, Diabetes. Performing Technologist: Griffin Basil RCT RDMS  Examination Guidelines: A complete evaluation includes B-mode imaging, spectral Doppler, color Doppler, and power Doppler as needed of all accessible portions of each vessel. Bilateral testing is considered an integral part of a complete examination. Limited examinations for reoccurring indications may be performed as noted.  Right Carotid Findings: +----------+--------+--------+--------+--------+------------------+           PSV cm/sEDV cm/sStenosisDescribeComments           +----------+--------+--------+--------+--------+------------------+ CCA Prox  97      20                                         +----------+--------+--------+--------+--------+------------------+ CCA Distal104     27                      intimal thickening +----------+--------+--------+--------+--------+------------------+ ICA Prox  118     36      1-39%           intimal thickening +----------+--------+--------+--------+--------+------------------+ ICA Distal132     39                      intimal thickening +----------+--------+--------+--------+--------+------------------+ ECA       90      5                                          +----------+--------+--------+--------+--------+------------------+ Portions of this table do not appear on this page. +----------+--------+-------+--------+------------+           PSV cm/sEDV cmsDescribeArm Pressure  +----------+--------+-------+--------+------------+ Subclavian188     4                           +----------+--------+-------+--------+------------+ +---------+--------+--+--------+--+---------+ VertebralPSV cm/s93EDV cm/s29Antegrade +---------+--------+--+--------+--+---------+ Left Carotid Findings: +----------+--------+--------+--------+--------+------------------+           PSV cm/sEDV cm/sStenosisDescribeComments           +----------+--------+--------+--------+--------+------------------+ CCA Prox  112     29                                         +----------+--------+--------+--------+--------+------------------+ CCA Distal105     23                      intimal thickening +----------+--------+--------+--------+--------+------------------+ ICA Prox  127     39                      intimal thickening +----------+--------+--------+--------+--------+------------------+ ICA Distal106     41                                         +----------+--------+--------+--------+--------+------------------+ ECA       102     13                                         +----------+--------+--------+--------+--------+------------------+ +----------+--------+--------+--------+------------+  SubclavianPSV cm/sEDV cm/sDescribeArm Pressure +----------+--------+--------+--------+------------+           212     7                            +----------+--------+--------+--------+------------+ +---------+--------+--+--------+--+---------+ VertebralPSV cm/s82EDV cm/s24Antegrade +---------+--------+--+--------+--+---------+  ABI Findings: +--------+------------------+-----+---------+--------+ Right   Rt Pressure (mmHg)IndexWaveform Comment  +--------+------------------+-----+---------+--------+ [id number]                    triphasic         +--------+------------------+-----+---------+--------+ +--------+------------------+-----+---------+-------+  Left    Lt Pressure (mmHg)IndexWaveform Comment +--------+------------------+-----+---------+-------+ Brachial120                    triphasic        +--------+------------------+-----+---------+-------+  Right Doppler Findings: +--------+--------+-----+---------+--------+ Site    PressureIndexDoppler  Comments +--------+--------+-----+---------+--------+ [id number]          triphasic         +--------+--------+-----+---------+--------+ Radial               triphasic         +--------+--------+-----+---------+--------+ Ulnar                triphasic         +--------+--------+-----+---------+--------+  Left Doppler Findings: +--------+--------+-----+---------+--------+ Site    PressureIndexDoppler  Comments +--------+--------+-----+---------+--------+ [id number]          triphasic         +--------+--------+-----+---------+--------+ Radial               triphasic         +--------+--------+-----+---------+--------+ Ulnar                triphasic         +--------+--------+-----+---------+--------+  Summary: Right Carotid: Velocities in the right ICA are consistent with a 1-39% stenosis.                The ECA appears <50% stenosed. Left Carotid: Velocities in the left ICA are consistent with a 1-39% stenosis.               The ECA appears <50% stenosed. Vertebrals: Bilateral vertebral arteries demonstrate antegrade flow. Right Upper Extremity: Doppler waveforms remain within normal limits with right radial compression. Doppler waveforms remain within normal limits with right ulnar compression. Left Upper Extremity: Doppler waveforms decrease 50% with left radial compression. Doppler waveform obliterate with left ulnar compression.  Electronically signed by Curt Jews MD on 08/19/2020 at 3:05:41 PM.    Final    VAS [place] LOWER EXTREMITY VENOUS (DVT)  Result Date: 08/17/2020  Lower Venous DVTStudy Indications: Swelling.  Limitations: Body habitus and poor  ultrasound/tissue interface. Comparison Study: No prior study Performing Technologist: Maudry Mayhew MHA, RDMS, RVT, RDCS  Examination Guidelines: A complete evaluation includes B-mode imaging, spectral Doppler, color Doppler, and power Doppler as needed of all accessible portions of each vessel. Bilateral testing is considered an integral part of a complete examination. Limited examinations for reoccurring indications may be performed as noted. The reflux portion of the exam is performed with the patient in reverse Trendelenburg.  +---------+---------------+---------+-----------+----------+--------------+ RIGHT    CompressibilityPhasicitySpontaneityPropertiesThrombus Aging +---------+---------------+---------+-----------+----------+--------------+ CFV      Full           Yes      Yes                                 +---------+---------------+---------+-----------+----------+--------------+  SFJ      Full                                                        +---------+---------------+---------+-----------+----------+--------------+ FV Prox  Full                                                        +---------+---------------+---------+-----------+----------+--------------+ FV Mid   Full                                                        +---------+---------------+---------+-----------+----------+--------------+ FV DistalFull                                                        +---------+---------------+---------+-----------+----------+--------------+ PFV      Full                                                        +---------+---------------+---------+-----------+----------+--------------+ POP      Full           Yes      Yes                                 +---------+---------------+---------+-----------+----------+--------------+ PTV      Full                                                         +---------+---------------+---------+-----------+----------+--------------+ PERO     Full                                                        +---------+---------------+---------+-----------+----------+--------------+   +---------+---------------+---------+-----------+----------+--------------+ LEFT     CompressibilityPhasicitySpontaneityPropertiesThrombus Aging +---------+---------------+---------+-----------+----------+--------------+ CFV      Full           Yes      Yes                                 +---------+---------------+---------+-----------+----------+--------------+ SFJ      Full                                                        +---------+---------------+---------+-----------+----------+--------------+  FV Prox  Full                                                        +---------+---------------+---------+-----------+----------+--------------+ FV Mid   Full                                                        +---------+---------------+---------+-----------+----------+--------------+ FV DistalFull                                                        +---------+---------------+---------+-----------+----------+--------------+ PFV      Full                                                        +---------+---------------+---------+-----------+----------+--------------+ POP      Full           Yes      Yes                                 +---------+---------------+---------+-----------+----------+--------------+ PTV      Full                                                        +---------+---------------+---------+-----------+----------+--------------+ PERO     Full                                                        +---------+---------------+---------+-----------+----------+--------------+     Summary: RIGHT: - There is no evidence of deep vein thrombosis in the lower extremity.  - No cystic structure found in  the popliteal fossa.  LEFT: - There is no evidence of deep vein thrombosis in the lower extremity.  - No cystic structure found in the popliteal fossa.  *See table(s) above for measurements and observations. Electronically signed by Ruta Hinds MD on 08/17/2020 at 7:11:02 PM.    Final    CT Angio Abd/Pel w/ and/or w/o  Result Date: 08/20/2020 CLINICAL DATA:  64 year old male with history of severe aortic stenosis. Preprocedural study prior to potential transcatheter aortic valve replacement (TAVR) procedure. EXAM: CT ANGIOGRAPHY CHEST, ABDOMEN AND PELVIS TECHNIQUE: Non-contrast CT of the chest was initially obtained. Multidetector CT imaging through the chest, abdomen and pelvis was performed using the standard protocol during bolus administration of intravenous contrast. Multiplanar reconstructed images and MIPs were obtained and reviewed to evaluate the vascular anatomy. CONTRAST:  176m OMNIPAQUE IOHEXOL 350 MG/ML SOLN COMPARISON:  None. FINDINGS:  CTA CHEST FINDINGS Cardiovascular: Heart size is mildly enlarged. There is no significant pericardial fluid, thickening or pericardial calcification. There is aortic atherosclerosis, as well as atherosclerosis of the great vessels of the mediastinum and the coronary arteries, including calcified atherosclerotic plaque in the left anterior descending, left circumflex and right coronary arteries. Severe thickening calcification of the aortic valve. Mild calcifications of the mitral annulus. Mediastinum/Nodes: No pathologically enlarged mediastinal or hilar lymph nodes. Esophagus is unremarkable in appearance. No axillary lymphadenopathy. Lungs/Pleura: Patchy areas of ground-glass attenuation and mild interlobular septal thickening noted throughout the lungs bilaterally, favored to reflect a background of mild interstitial pulmonary edema. No confluent consolidative airspace disease. Moderate right and small left pleural effusions lying dependently with some  associated passive subsegmental atelectasis in the lower lobes of the lungs bilaterally. Mild scarring in the medial aspect of the right upper lobe and inferior segment of the lingula. Musculoskeletal: There are no aggressive appearing lytic or blastic lesions noted in the visualized portions of the skeleton. CTA ABDOMEN AND PELVIS FINDINGS Hepatobiliary: Diffuse low attenuation throughout the hepatic parenchyma, indicative of a background of hepatic steatosis. In the inferior aspect of segment 5 of the liver (axial image 134 of series 4) there is a small hypovascular lesion measuring 1.3 cm in diameter (44 HU), which is indeterminate. No other larger more suspicious appearing hepatic lesions. No intra or extrahepatic biliary ductal dilatation. Gallbladder is normal in appearance. Pancreas: No pancreatic mass. No pancreatic ductal dilatation. No pancreatic or peripancreatic fluid collections or inflammatory changes. Spleen: Unremarkable. Adrenals/Urinary Tract: Subcentimeter low-attenuation lesions in the right kidney, too small to characterize, but statistically likely to represent cysts. Left kidney and bilateral adrenal glands are normal in appearance. No hydroureteronephrosis. Urinary bladder is normal in appearance. Stomach/Bowel: The appearance of the stomach is normal. No pathologic dilatation of small bowel or colon. Normal appendix. Vascular/Lymphatic: Aortic atherosclerosis, with vascular findings and measurements pertinent to potential TAVR procedure, as detailed below. No aneurysm or dissection noted in the abdominal or pelvic vasculature. No lymphadenopathy noted in the abdomen or pelvis. Reproductive: Prostate gland and seminal vesicles are unremarkable in appearance. Other: Moderate-sized umbilical hernia containing only omental fat. Left inguinal hernia containing predominantly fat. No significant volume of ascites. No pneumoperitoneum. Musculoskeletal: There are no aggressive appearing lytic or  blastic lesions noted in the visualized portions of the skeleton. VASCULAR MEASUREMENTS PERTINENT TO TAVR: AORTA: Minimal Aortic Diameter-13 x 12 mm Severity of Aortic Calcification-moderate to severe RIGHT PELVIS: Right Common Iliac Artery - Minimal Diameter-7.3 x 7.8 mm Tortuosity - mild Calcification-moderate Right External Iliac Artery - Minimal Diameter-8.1 x 4.2 mm Tortuosity - mild Calcification-mild Right Common Femoral Artery - Minimal Diameter-7.9 x 7.6 mm Tortuosity - mild Calcification-mild LEFT PELVIS: Left Common Iliac Artery - Minimal Diameter-7.4 x 7.8 mm Tortuosity - mild Calcification-moderate Left External Iliac Artery - Minimal Diameter-7.7 x 5.8 mm Tortuosity - mild Calcification-none Left Common Femoral Artery - Minimal Diameter-8.2 x 8.2 mm Tortuosity-mild Calcification-minimal Review of the MIP images confirms the above findings. IMPRESSION: 1. Vascular findings and measurements pertinent to potential TAVR procedure, as detailed above. 2. Severe thickening calcification of the aortic valve, compatible with the reported clinical history of severe aortic stenosis. 3. Aortic atherosclerosis, in addition to three vessel coronary artery disease. Please note that although the presence of coronary artery calcium documents the presence of coronary artery disease, the severity of this disease and any potential stenosis cannot be assessed on this non-gated CT examination. Assessment for potential risk factor modification,  dietary therapy or pharmacologic therapy may be warranted, if clinically indicated. 4. Cardiomegaly with evidence of interstitial pulmonary edema and bilateral pleural effusions (right greater than left); imaging findings suggestive of congestive heart failure. 5. Hepatic steatosis. 6. 1.3 cm indeterminate hypovascular lesion in segment 5 of the liver. Correlation with nonemergent abdominal MRI with and without IV gadolinium is recommended in the near future to definitively  characterize this lesion and exclude neoplasm. 7. Additional incidental findings, as above. Electronically Signed   By: Vinnie Langton M.D.   On: 08/20/2020 08:53    (Echo, Carotid, EGD, Colonoscopy, ERCP)    Subjective: No new complaints feels great  Discharge Exam: Vitals:   08/22/20 1941 08/23/20 0517  BP: (!) 143/64 (!) 114/53  Pulse: 77 73  Resp: 20 18  Temp: 97.7 F (36.5 C) 98.2 F (36.8 C)  SpO2: 96% 100%   Vitals:   08/22/20 0821 08/22/20 1138 08/22/20 1941 08/23/20 0517  BP: (!) 147/65 (!) 116/56 (!) 143/64 (!) 114/53  Pulse: 78 72 77 73  Resp: _0 Temp: 98.1 F (36.7 C) 97.8 F (36.6 C) 97.7 F (36.5 C) 98.2 F (36.8 C)  TempSrc: Oral Oral Oral   SpO2: 94% 98% 96% 100%  Weight:    (!) 142 kg  Height:        General: Pt is alert, awake, not in acute distress Cardiovascular: RRR, S1/S2 +, no rubs, no gallops Respiratory: CTA bilaterally, no wheezing, no rhonchi Abdominal: Soft, NT, ND, bowel sounds + Extremities: no edema, no cyanosis    The results of significant diagnostics from this hospitalization (including imaging, microbiology, ancillary and laboratory) are listed below for reference.     Microbiology: Recent Results (from the past 240 hour(s))  Respiratory Panel by RT PCR (Flu A&B, Covid) - Nasopharyngeal Swab     Status: None   Collection Time: 08/15/20  5:19 PM   Specimen: Nasopharyngeal Swab  Result Value Ref Range Status   SARS Coronavirus 2 by RT PCR NEGATIVE NEGATIVE Final    Comment: (NOTE) SARS-CoV-2 target nucleic acids are NOT DETECTED.  The SARS-CoV-2 RNA is generally detectable in upper respiratoy specimens during the acute phase of infection. The lowest concentration of SARS-CoV-2 viral copies this assay can detect is 131 copies/mL. A negative result does not preclude SARS-Cov-2 infection and should not be used as the sole basis for treatment or other patient management decisions. A negative result may occur with   improper specimen collection/handling, submission of specimen other than nasopharyngeal swab, presence of viral mutation(s) within the areas targeted by this assay, and inadequate number of viral copies (<131 copies/mL). A negative result must be combined with clinical observations, patient history, and epidemiological information. The expected result is Negative.  Fact Sheet for Patients:  PinkCheek.be  Fact Sheet for Healthcare Providers:  GravelBags.it  This test is no t yet approved or cleared by the [place] FDA and  has been authorized for detection and/or diagnosis of SARS-CoV-2 by FDA under an Emergency Use Authorization (EUA). This EUA will remain  in effect (meaning this test can be used) for the duration of the COVID-19 declaration under Section 564(b)(1) of the Act, 21 U.S.C. section 360bbb-3(b)(1), unless the authorization is terminated or revoked sooner.     Influenza A by PCR NEGATIVE NEGATIVE Final   Influenza B by PCR NEGATIVE NEGATIVE Final    Comment: (NOTE) The Xpert Xpress SARS-CoV-2/FLU/RSV assay is intended as an aid in  the diagnosis of influenza  from Nasopharyngeal swab specimens and  should not be used as a sole basis for treatment. Nasal washings and  aspirates are unacceptable for Xpert Xpress SARS-CoV-2/FLU/RSV  testing.  Fact Sheet for Patients: PinkCheek.be  Fact Sheet for Healthcare Providers: GravelBags.it  This test is not yet approved or cleared by the [place] FDA and  has been authorized for detection and/or diagnosis of SARS-CoV-2 by  FDA under an Emergency Use Authorization (EUA). This EUA will remain  in effect (meaning this test can be used) for the duration of the  Covid-19 declaration under Section 564(b)(1) of the Act, 21  U.S.C. section 360bbb-3(b)(1), unless the authorization is  terminated or  revoked. Performed at [facility], [address] [place], [place] [address]   Culture, blood (routine x 2) Call MD if unable to obtain prior to antibiotics being given     Status: None   Collection Time: 08/16/20  9:04 PM   Specimen: BLOOD  Result Value Ref Range Status   Specimen Description BLOOD RIGHT ARM  Final   Special Requests   Final    BOTTLES DRAWN AEROBIC AND ANAEROBIC Blood Culture results may not be optimal due to an excessive volume of blood received in culture bottles   Culture   Final    NO GROWTH 5 DAYS Performed at [facility], [place] [address], [place], [place] [address]    Report Status 08/21/2020 FINAL  Final  Culture, blood (routine x 2) Call MD if unable to obtain prior to antibiotics being given     Status: None   Collection Time: 08/16/20  9:05 PM   Specimen: BLOOD RIGHT HAND  Result Value Ref Range Status   Specimen Description BLOOD RIGHT HAND  Final   Special Requests   Final    BOTTLES DRAWN AEROBIC ONLY Blood Culture adequate volume   Culture   Final    NO GROWTH 5 DAYS Performed at [facility], [place] [address], [place],  [address]    Report Status 08/21/2020 FINAL  Final     Labs: BNP (last 3 results) Recent Labs    08/15/20 1801 08/16/20 2105  BNP 152.3* 559.7*   Basic Metabolic Panel: Recent Labs  Lab 08/16/20 1459 08/17/20 0351 08/19/20 0815 08/20/20 0219 08/21/20 0147 08/22/20 0903 08/23/20 0241  NA 139   < > 142 140 139 139 137  K 4.2   < > 4.0 4.1 4.2 3.9 4.1  CL 95*   < > 99 97* 97* 95* 97*  CO2 29   < > 33* 34* 33* 33* 31  GLUCOSE 209*   < > 154* 177* 174* 345* 225*  BUN 22   < > 20 21 25* 23 21  CREATININE 1.41*   < > 1.36* 1.69* 1.65* 1.58* 1.43*  CALCIUM 9.5   < > 9.1 9.0 8.8* 9.2 8.9  MG 2.0  --   --   --   --   --   --    < > = values in this interval not displayed.   Liver Function Tests: No results for input(s): AST, ALT, ALKPHOS, BILITOT, PROT, ALBUMIN in the last 168 hours. No  results for input(s): LIPASE, AMYLASE in the last 168 hours. No results for input(s): AMMONIA in the last 168 hours. CBC: Recent Labs  Lab 08/16/20 1459 08/16/20 1459 08/17/20 0351 08/18/20 0320 08/18/20 1327 08/18/20 1331 08/19/20 0219  WBC 10.8*  --  13.9* 12.3*  --   --  8.9  HGB 10.6*   < > 10.1* 9.8* 11.2* 11.2* 9.9*  HCT 36.1*   < > 33.9* 33.9* 33.0* 33.0* 34.8*  MCV 83.8  --  84.3 86.0  --   --  86.1  PLT 340  --  322 307  --   --  279   < > = values in this interval not displayed.   Cardiac Enzymes: No results for input(s): CKTOTAL, CKMB, CKMBINDEX, TROPONINI in the last 168 hours. BNP: Invalid input(s): POCBNP CBG: Recent Labs  Lab 08/22/20 0607 08/22/20 1135 08/22/20 1621 08/22/20 2129 08/23/20 0609  GLUCAP 162* 279* 180* 229* 178*   D-Dimer No results for input(s): DDIMER in the last 72 hours. Hgb A1c No results for input(s): HGBA1C in the last 72 hours. Lipid Profile No results for input(s): CHOL, HDL, LDLCALC, TRIG, CHOLHDL, LDLDIRECT in the last 72 hours. Thyroid function studies No results for input(s): TSH, T4TOTAL, T3FREE, THYROIDAB in the last 72 hours.  Invalid input(s): FREET3 Anemia work up No results for input(s): VITAMINB12, FOLATE, FERRITIN, TIBC, IRON, RETICCTPCT in the last 72 hours. Urinalysis No results found for: COLORURINE, APPEARANCEUR, [place], [place], [place], [place], [place], [place], PROTEINUR, UROBILINOGEN, NITRITE, LEUKOCYTESUR Sepsis Labs Invalid input(s): PROCALCITONIN,  WBC,  LACTICIDVEN Microbiology Recent Results (from the past 240 hour(s))  Respiratory Panel by RT PCR (Flu A&B, Covid) - Nasopharyngeal Swab     Status: None   Collection Time: 08/15/20  5:19 PM   Specimen: Nasopharyngeal Swab  Result Value Ref Range Status   SARS Coronavirus 2 by RT PCR NEGATIVE NEGATIVE Final    Comment: (NOTE) SARS-CoV-2 target nucleic acids are NOT DETECTED.  The SARS-CoV-2 RNA is generally detectable in upper  respiratoy specimens during the acute phase of infection. The lowest concentration of SARS-CoV-2 viral copies this assay can detect is 131 copies/mL. A negative result does not preclude SARS-Cov-2 infection and should not be used as the sole basis for treatment or other patient management decisions. A negative result may occur with  improper specimen collection/handling, submission of specimen other than nasopharyngeal swab, presence of viral mutation(s) within the areas targeted by this assay, and inadequate number of viral copies (<131 copies/mL). A negative result must be combined with clinical observations, patient history, and epidemiological information. The expected result is Negative.  Fact Sheet for Patients:  PinkCheek.be  Fact Sheet for Healthcare Providers:  GravelBags.it  This test is no t yet approved or cleared by the [place] FDA and  has been authorized for detection and/or diagnosis of SARS-CoV-2 by FDA under an Emergency Use Authorization (EUA). This EUA will remain  in effect (meaning this test can be used) for the duration of the COVID-19 declaration under Section 564(b)(1) of the Act, 21 U.S.C. section 360bbb-3(b)(1), unless the authorization is terminated or revoked sooner.     Influenza A by PCR NEGATIVE NEGATIVE Final   Influenza B by PCR NEGATIVE NEGATIVE Final    Comment: (NOTE) The Xpert Xpress SARS-CoV-2/FLU/RSV assay is intended as an aid in  the diagnosis of influenza from Nasopharyngeal swab specimens and  should not be used as a sole basis for treatment. Nasal washings and  aspirates are unacceptable for Xpert Xpress SARS-CoV-2/FLU/RSV  testing.  Fact Sheet for Patients: PinkCheek.be  Fact Sheet for Healthcare Providers: GravelBags.it  This test is not yet approved or cleared by the [place] FDA and  has been  authorized for detection and/or diagnosis of SARS-CoV-2 by  FDA under an Emergency Use Authorization (EUA). This  EUA will remain  in effect (meaning this test can be used) for the duration of the  Covid-19 declaration under Section 564(b)(1) of the Act, 21  U.S.C. section 360bbb-3(b)(1), unless the authorization is  terminated or revoked. Performed at [facility], [address] [place], [place] [address]   Culture, blood (routine x 2) Call MD if unable to obtain prior to antibiotics being given     Status: None   Collection Time: 08/16/20  9:04 PM   Specimen: BLOOD  Result Value Ref Range Status   Specimen Description BLOOD RIGHT ARM  Final   Special Requests   Final    BOTTLES DRAWN AEROBIC AND ANAEROBIC Blood Culture results may not be optimal due to an excessive volume of blood received in culture bottles   Culture   Final    NO GROWTH 5 DAYS Performed at [facility], [place] [address] [place], [place] [address]    Report Status 08/21/2020 FINAL  Final  Culture, blood (routine x 2) Call MD if unable to obtain prior to antibiotics being given     Status: None   Collection Time: 08/16/20  9:05 PM   Specimen: BLOOD RIGHT HAND  Result Value Ref Range Status   Specimen Description BLOOD RIGHT HAND  Final   Special Requests   Final    BOTTLES DRAWN AEROBIC ONLY Blood Culture adequate volume   Culture   Final    NO GROWTH 5 DAYS Performed at [facility], [place] [address] [place], [place] [address]    Report Status 08/21/2020 FINAL  Final     Time coordinating discharge: Over 30 minutes  SIGNED:   Charlynne Cousins, MD  [facility] 08/23/2020, 8:14 AM Pager   If 7PM-7AM, please contact night-coverage [email or web address] Password [email or web address]

## 2020-08-23 NOTE — Progress Notes (Signed)
Patient was discharged home with all of his belongings and discharge packet with his paper prescription. All IVs were discontinued prior to discharge.

## 2020-08-23 NOTE — Progress Notes (Signed)
Progress Note  Patient Name: Lawrence Rose Date of Encounter: 08/23/2020  [facility] Cardiologist: Christell Constant, MD   Subjective   Feels well, no SOB,  Working to get his strength up.    Inpatient Medications    Scheduled Meds: . aspirin EC  81 mg Oral Daily  . chlorhexidine  15 mL Mouth Rinse BID  . cholecalciferol  1,000 Units Oral QHS  . cloNIDine  0.1 mg Oral BID  . diltiazem  240 mg Oral Daily  . folic acid  1 mg Oral Daily  . gabapentin  600 mg Oral BID  . heparin  5,000 Units Subcutaneous Q8H  . insulin aspart  0-9 Units Subcutaneous TID WC  . latanoprost  1 drop Both Eyes QHS  . mouth rinse  15 mL Mouth Rinse q12n4p  . metoprolol tartrate  25 mg Oral BID  . multivitamin with minerals  1 tablet Oral QHS  . oxyCODONE  10 mg Oral Q12H  . pantoprazole  40 mg Oral BID  . rosuvastatin  20 mg Oral Daily  . sodium chloride flush  3 mL Intravenous Q12H   Continuous Infusions: . sodium chloride 140 mL/hr at 08/18/20 0918  . sodium chloride    . lactated ringers 10 mL/hr at 08/21/20 1655   PRN Meds: sodium chloride, sodium chloride, acetaminophen, HYDROcodone-acetaminophen, nitroGLYCERIN, ondansetron (ZOFRAN) IV, oxyCODONE, sodium chloride flush   Vital Signs    Vitals:   08/22/20 0821 08/22/20 1138 08/22/20 1941 08/23/20 0517  BP: (!) 147/65 (!) 116/56 (!) 143/64 (!) 114/53  Pulse: 78 72 77 73  Resp: 20 20 20 18   Temp: 98.1 F (36.7 C) 97.8 F (36.6 C) 97.7 F (36.5 C) 98.2 F (36.8 C)  TempSrc: Oral Oral Oral   SpO2: 94% 98% 96% 100%  Weight:    (!) 142 kg  Height:        Intake/Output Summary (Last 24 hours) at 08/23/2020 0721 Last data filed at 08/23/2020 0524 Gross per 24 hour  Intake 720 ml  Output 1700 ml  Net -980 ml   Last 3 Weights 08/23/2020 08/22/2020 08/21/2020  Weight (lbs) 313 lb 1.6 oz 310 lb 12.8 oz 303 lb 3.2 oz  Weight (kg) 142.021 kg 140.978 kg 137.531 kg      Telemetry    SR - Personally Reviewed  ECG    No  new- Personally Reviewed  Physical Exam   GEN: Obese Male No acute distress.   Neck: No JVD Cardiac: RRR,3/6 systolic murmurs, rubs, or gallops.  Respiratory: Clear to auscultation bilaterally. GI: Soft, nontender, non-distended  MS: No edema; No deformity. Neuro:  Nonfocal  Psych: Normal affect   Labs    High Sensitivity Troponin:   Recent Labs  Lab 08/15/20 1955 08/15/20 2241 08/16/20 1459 08/16/20 1815 08/16/20 2105  TROPONINIHS 402* 330* 148* 164* 176*  175*      Chemistry Recent Labs  Lab 08/21/20 0147 08/22/20 0903 08/23/20 0241  NA 139 139 137  K 4.2 3.9 4.1  CL 97* 95* 97*  CO2 33* 33* 31  GLUCOSE 174* 345* 225*  BUN 25* 23 21  CREATININE 1.65* 1.58* 1.43*  CALCIUM 8.8* 9.2 8.9  GFRNONAA 46* 48* 54*  ANIONGAP 9 11 9      Hematology Recent Labs  Lab 08/17/20 0351 08/17/20 0351 08/18/20 0320 08/18/20 0320 08/18/20 1327 08/18/20 1331 08/19/20 0219  WBC 13.9*  --  12.3*  --   --   --  8.9  RBC 4.02*  --  3.94*  --   --   --  4.04*  HGB 10.1*   < > 9.8*   < > 11.2* 11.2* 9.9*  HCT 33.9*   < > 33.9*   < > 33.0* 33.0* 34.8*  MCV 84.3  --  86.0  --   --   --  86.1  MCH 25.1*  --  24.9*  --   --   --  24.5*  MCHC 29.8*  --  28.9*  --   --   --  28.4*  RDW 15.3  --  15.3  --   --   --  15.3  PLT 322  --  307  --   --   --  279   < > = values in this interval not displayed.    BNP Recent Labs  Lab 08/16/20 2105  BNP 246.2*     DDimer  Recent Labs  Lab 08/16/20 1459  DDIMER 1.02*     Radiology    ECHO TEE  Result Date: 08/21/2020    TRANSESOPHOGEAL ECHO REPORT   Patient Name:   Chase CallerJAMES Zietz Date of Exam: 08/21/2020 Medical Rec #:  [id number]      Height:       71.0 in Accession #:    [id number]     Weight:       303.2 lb Date of Birth:  [birth year]      BSA:          2.516 m Patient Age:    64 years       BP:           149/75 mmHg Patient Gender: M              HR:           87 bpm. Exam Location:  Inpatient Procedure: Transesophageal  Echo, Cardiac Doppler and Color Doppler Indications:     I35.0 Nonrheumatic aortic (valve) stenosis  History:         Patient has prior history of Echocardiogram examinations, most                  recent 08/17/2020. Previous Myocardial Infarction and CAD,                  Aortic Valve Disease; Risk Factors:Sleep Apnea. Severe aortic                  stenosis.  Sonographer:     Sheralyn Boatmanina West RDCS Referring Phys:  [id number] Wille CelesteKATHRYN R THOMPSON Diagnosing Phys: Olga MillersBrian Crenshaw MD PROCEDURE: After discussion of the risks and benefits of a TEE, an informed consent was obtained from the patient. The transesophogeal probe was passed without difficulty through the esophogus of the patient. Imaged were obtained with the patient in a left lateral decubitus position. Sedation performed by different physician. The patient was monitored while under deep sedation. Anesthestetic sedation was provided intravenously by Anesthesiology: 300mg  of Propofol. The patient's vital signs; including heart rate, blood pressure, and oxygen saturation; remained stable throughout the procedure. The patient developed no complications during the procedure. IMPRESSIONS  1. Severe AS (mean gradient 42 mmHg and peak velocity of 4.2 m/s); mild AI.  2. Left ventricular ejection fraction, by estimation, is 70 to 75%. The left ventricle has hyperdynamic function. There is severe left ventricular hypertrophy.  3. Right ventricular systolic function is normal. The right ventricular size is normal.  4. Left atrial size was moderately dilated. No left atrial/left atrial  appendage thrombus was detected.  5. The mitral valve is normal in structure. Trivial mitral valve regurgitation.  6. The aortic valve is tricuspid. Aortic valve regurgitation is mild. Severe aortic valve stenosis.  7. There is Moderate (Grade III) plaque involving the descending aorta. FINDINGS  Left Ventricle: Left ventricular ejection fraction, by estimation, is 70 to 75%. The left ventricle has  hyperdynamic function. The left ventricular internal cavity size was normal in size. There is severe left ventricular hypertrophy. Right Ventricle: The right ventricular size is normal. Right vetricular wall thickness was not assessed. Right ventricular systolic function is normal. Left Atrium: Left atrial size was moderately dilated. No left atrial/left atrial appendage thrombus was detected. Right Atrium: Right atrial size was normal in size. Pericardium: There is no evidence of pericardial effusion. Mitral Valve: The mitral valve is normal in structure. Mild mitral annular calcification. Trivial mitral valve regurgitation. MV peak gradient, 5.8 mmHg. The mean mitral valve gradient is 2.0 mmHg. Tricuspid Valve: The tricuspid valve is normal in structure. Tricuspid valve regurgitation is mild. Aortic Valve: The aortic valve is tricuspid. Aortic valve regurgitation is mild. Severe aortic stenosis is present. Aortic valve mean gradient measures 41.5 mmHg. Aortic valve peak gradient measures 66.7 mmHg. Aortic valve area, by VTI measures 1.04 cm. Pulmonic Valve: The pulmonic valve was normal in structure. Pulmonic valve regurgitation is not visualized. Aorta: The aortic root is normal in size and structure. There is moderate (Grade III) plaque involving the descending aorta. IAS/Shunts: No atrial level shunt detected by color flow Doppler. Additional Comments: Severe AS (mean gradient 42 mmHg and peak velocity of 4.2 m/s); mild AI.  LEFT VENTRICLE PLAX 2D LVOT diam:     2.20 cm LV SV:         81 LV SV Index:   32 LVOT Area:     3.80 cm  AORTIC VALVE AV Area (Vmax):    1.01 cm AV Area (Vmean):   0.92 cm AV Area (VTI):     1.04 cm AV Vmax:           408.50 cm/s AV Vmean:          307.500 cm/s AV VTI:            0.778 m AV Peak Grad:      66.7 mmHg AV Mean Grad:      41.5 mmHg LVOT Vmax:         108.00 cm/s LVOT Vmean:        74.800 cm/s LVOT VTI:          0.214 m LVOT/AV VTI ratio: 0.27 MITRAL VALVE MV Peak grad:  5.8 mmHg  SHUNTS MV Mean grad: 2.0 mmHg  Systemic VTI:  0.21 m MV Vmax:      1.20 m/s  Systemic Diam: 2.20 cm MV Vmean:     60.7 cm/s Olga Millers MD Electronically signed by Olga Millers MD Signature Date/Time: 08/21/2020/9:21:38 AM    Final     Cardiac Studies   TEE 08/21/2020 IMPRESSIONS     1. Severe AS (mean gradient 42 mmHg and peak velocity of 4.2 m/s); mild  AI.   2. Left ventricular ejection fraction, by estimation, is 70 to 75%. The  left ventricle has hyperdynamic function. There is severe left ventricular  hypertrophy.   3. Right ventricular systolic function is normal. The right ventricular  size is normal.   4. Left atrial size was moderately dilated. No left atrial/left atrial  appendage thrombus was detected.  5. The mitral valve is normal in structure. Trivial mitral valve  regurgitation.   6. The aortic valve is tricuspid. Aortic valve regurgitation is mild.  Severe aortic valve stenosis.   7. There is Moderate (Grade III) plaque involving the descending aorta.    RIGHT/LEFT HEART CATH AND CORONARY ANGIOGRAPHY  09/17/2020  Conclusion    Widely patent coronary arteries  High cardiac output: 9.06 L/min by Fick and 9.23 L/min by thermal dilution.  Etiology uncertain.  Suppressed TSH with normal T4 raising question of T3 thyrotoxicosis versus other mechanism.  Severe pulmonary hypertension, mean pressure 48 mmHg, based upon hemodynamics WHO group 2  Pulmonary capillary wedge pressure mean 34 mmHg, pulmonary vascular resistance 1.55 Woods units.  Mean right atrial pressure 19 mmHg  Aortic valve gradient peak to peak 33 mmHg with mean gradient 22 mmHg.  Aortic valve area 1.94 cm by Fick cardiac output and 1.98 cm by thermodilution  LV function normal by echocardiography with moderate LVH   RECOMMENDATIONS:    Further work-up/management per treating team.  Needs further diuresis.  T3 level   Echo 08/17/2020 1. Left ventricular ejection fraction, by  estimation, is 60 to 65%. The  left ventricle has normal function. The left ventricle has no regional  wall motion abnormalities. There is moderate concentric left ventricular  hypertrophy. Left ventricular  diastolic parameters are indeterminate.   2. Right ventricular systolic function is normal. The right ventricular  size is normal.   3. Left atrial size was severely dilated.   4. The mitral valve is normal in structure. Trivial mitral valve  regurgitation. The posterior leaflet appears to have limited mobility with  mild flow acceleration noted across the mitral valve with mean gradient  at HR 101. There is mild narrowing  of the mitral valve orfice without significant stenosis.   5. The aortic valve is tricuspid. There is severe calcifcation of the  aortic valve. There is severe thickening of the aortic valve. Aortic valve  regurgitation is not visualized. Severe aortic valve stenosis with AVA 0.9  (by continuity), mean gradient  , peak gradient , Vmax 4.65cm2, DOI 0.2.   6. The inferior vena cava is dilated in size with >50% respiratory  variability, suggesting right atrial pressure of 8 mmHg.    Patient Profile     64 y.o. male with Diabetes with HTN, HLD, Morbid Obesity and OSA on CPAP, and CKD Stage III who presented for troponin elevation 08/16/20 who found to have severe AS.   Assessment & Plan    Aortic stenosis/mild Mitral Stenosis with pulmonary hypertension AKI on CKD (CIN) Morbid Obesity; Diabetes with HTN - Has completed TTE, TEE, CT TAVR, and LHC/RHC; end result suggestive of severe AS as etiology of SOB - Given comorbidity planned for outpatient TAVR; 09/01/20 - holding diuresis in the setting of contrast induced nephropathy (improving) - Continue ASA, Statin and BB; if BP continues to tend up, can switch from metoprolol to labetalol, (EF 60-65%) - Likely can be discharged prior to 09/01/20   For questions or updates, please contact [facility] Please consult [email or web address] for contact info under        Signed, Christell Constant, MD  08/23/2020, 7:21 AM

## 2020-08-23 NOTE — Progress Notes (Deleted)
[facility] PROGRESS NOTE    Progress Note  Lawrence Rose  ZOX:[id number] DOB: 06-15-1956 DOA: 08/15/2020 PCP: Pcp, No     Brief Narrative:   Lawrence CallerJames Rose is an 64 y.o. male past medical history significant for obesity, essential hypertension diabetes mellitus type 2 presents [facility] with shortness of breath for 1 week.  As per patient his Lasix dose was reduced about a month earlier.  CT was concerning for pneumonia, he also had elevated troponins he was placed IV heparin Rocephin and azithromycin and [facility], the echo done on August 17, 2020 shows severe aortic stenosis with severe LVH  Assessment/Plan:   Acute respiratory failure with hypoxia (HCC) due to severe aortic stenosis leading to pulmonary edema: Cardiology and CT surgery was consulted who recommended a TEE,  that showed severe AS. Patient will have TAVR's as an outpatient. Appreciate cardiology assistance.    Acute kidney injury on chronic kidney disease stage IIIa: With a baseline creatinine of 1.3, continue to hold diuretic therapy basic metabolic panel is pending this morning.  Elevated troponins: Currently on aspirin, statins, continue to hold ACE inhibitor due to mild rise in his creatinine.  Low TSH: We will need to check free T4 and TSH in 6 weeks question sick euthyroid syndrome.  Chronic lower extremity edema: Has resolved.  Controlled diabetes mellitus type 2: Metformin was discontinued not on insulin at home, his A1c is 5.5 we will need to monitor as an outpatient.  Normocytic anemia/anemia of chronic disease: Noted.  Essential hypertension: Blood pressure is fairly controlled currently on Cardizem clonidine metoprolol. Continue to hold Lasix and ACE inhibitor until being it is back.  Obstructive sleep apnea: CPAP at bedtime.  Chronic pain: Continue Norco and Neurontin.    Obesity, Class III, BMI 40-49.9 (morbid obesity) (HCC) Estimated body mass  index is 43.67 kg/m as calculated from the following:   Height as of this encounter: 5\' 11"  (1.803 m).   Weight as of this encounter: 142 kg.   DVT prophylaxis: lovenox Family Communication:none Status is: Inpatient  Remains inpatient appropriate because:Hemodynamically unstable   Dispo: The patient is from: Home              Anticipated d/c is to: Home              Anticipated d/c date is: 2 days              Patient currently is not medically stable to d/c.     Code Status:     Code Status Orders  (From admission, onward)         Start     Ordered   08/16/20 1440  Full code  Continuous        08/16/20 1443        Code Status History    This patient has a current code status but no historical code status.   Advance Care Planning Activity        IV Access:    Peripheral IV   Procedures and diagnostic studies:   ECHO TEE  Result Date: 08/21/2020    TRANSESOPHOGEAL ECHO REPORT   Patient Name:   Lawrence CallerJAMES Stalling Date of Exam: 08/21/2020 Medical Rec #:  [id number]      Height:       71.0 in Accession #:    [id number]     Weight:       303.2 lb Date of Birth:  06-15-1956  BSA:          2.516 m Patient Age:    64 years       BP:           149/75 mmHg Patient Gender: M              HR:           87 bpm. Exam Location:  Inpatient Procedure: Transesophageal Echo, Cardiac Doppler and Color Doppler Indications:     I35.0 Nonrheumatic aortic (valve) stenosis  History:         Patient has prior history of Echocardiogram examinations, most                  recent 08/17/2020. Previous Myocardial Infarction and CAD,                  Aortic Valve Disease; Risk Factors:Sleep Apnea. Severe aortic                  stenosis.  Sonographer:     Lawrence Rose RDCS Referring Phys:  [id number] Lawrence Rose Diagnosing Phys: Olga Millers MD PROCEDURE: After discussion of the risks and benefits of a TEE, an informed consent was obtained from the patient. The transesophogeal probe was passed  without difficulty through the esophogus of the patient. Imaged were obtained with the patient in a left lateral decubitus position. Sedation performed by different physician. The patient was monitored while under deep sedation. Anesthestetic sedation was provided intravenously by Anesthesiology: 300mg  of Propofol. The patient's vital signs; including heart rate, blood pressure, and oxygen saturation; remained stable throughout the procedure. The patient developed no complications during the procedure. IMPRESSIONS  1. Severe AS (mean gradient 42 mmHg and peak velocity of 4.2 m/s); mild AI.  2. Left ventricular ejection fraction, by estimation, is 70 to 75%. The left ventricle has hyperdynamic function. There is severe left ventricular hypertrophy.  3. Right ventricular systolic function is normal. The right ventricular size is normal.  4. Left atrial size was moderately dilated. No left atrial/left atrial appendage thrombus was detected.  5. The mitral valve is normal in structure. Trivial mitral valve regurgitation.  6. The aortic valve is tricuspid. Aortic valve regurgitation is mild. Severe aortic valve stenosis.  7. There is Moderate (Grade III) plaque involving the descending aorta. FINDINGS  Left Ventricle: Left ventricular ejection fraction, by estimation, is 70 to 75%. The left ventricle has hyperdynamic function. The left ventricular internal cavity size was normal in size. There is severe left ventricular hypertrophy. Right Ventricle: The right ventricular size is normal. Right vetricular wall thickness was not assessed. Right ventricular systolic function is normal. Left Atrium: Left atrial size was moderately dilated. No left atrial/left atrial appendage thrombus was detected. Right Atrium: Right atrial size was normal in size. Pericardium: There is no evidence of pericardial effusion. Mitral Valve: The mitral valve is normal in structure. Mild mitral annular calcification. Trivial mitral valve  regurgitation. MV peak gradient, 5.8 mmHg. The mean mitral valve gradient is 2.0 mmHg. Tricuspid Valve: The tricuspid valve is normal in structure. Tricuspid valve regurgitation is mild. Aortic Valve: The aortic valve is tricuspid. Aortic valve regurgitation is mild. Severe aortic stenosis is present. Aortic valve mean gradient measures 41.5 mmHg. Aortic valve peak gradient measures 66.7 mmHg. Aortic valve area, by VTI measures 1.04 cm. Pulmonic Valve: The pulmonic valve was normal in structure. Pulmonic valve regurgitation is not visualized. Aorta: The aortic root is normal in size and structure. There  is moderate (Grade III) plaque involving the descending aorta. IAS/Shunts: No atrial level shunt detected by color flow Doppler. Additional Comments: Severe AS (mean gradient 42 mmHg and peak velocity of 4.2 m/s); mild AI.  LEFT VENTRICLE PLAX 2D LVOT diam:     2.20 cm LV SV:         81 LV SV Index:   32 LVOT Area:     3.80 cm  AORTIC VALVE AV Area (Vmax):    1.01 cm AV Area (Vmean):   0.92 cm AV Area (VTI):     1.04 cm AV Vmax:           408.50 cm/s AV Vmean:          307.500 cm/s AV VTI:            0.778 m AV Peak Grad:      66.7 mmHg AV Mean Grad:      41.5 mmHg LVOT Vmax:         108.00 cm/s LVOT Vmean:        74.800 cm/s LVOT VTI:          0.214 m LVOT/AV VTI ratio: 0.27 MITRAL VALVE MV Peak grad: 5.8 mmHg  SHUNTS MV Mean grad: 2.0 mmHg  Systemic VTI:  0.21 m MV Vmax:      1.20 m/s  Systemic Diam: 2.20 cm MV Vmean:     60.7 cm/s Olga Millers MD Electronically signed by Olga Millers MD Signature Date/Time: 08/21/2020/9:21:38 AM    Final      Medical Consultants:    None.  Anti-Infectives:   none  Subjective:    Lawrence Rose relates his breathing is significantly improved compared to admission no new complaints.  Objective:    Vitals:   08/22/20 0821 08/22/20 1138 08/22/20 1941 08/23/20 0517  BP: (!) 147/65 (!) 116/56 (!) 143/64 (!) 114/53  Pulse: 78 72 77 73  Resp: Temp: 98.1 F (36.7 C) 97.8 F (36.6 C) 97.7 F (36.5 C) 98.2 F (36.8 C)  TempSrc: Oral Oral Oral   SpO2: 94% 98% 96% 100%  Weight:    (!) 142 kg  Height:       SpO2: 100 % O2 Flow Rate (L/min): 6 L/min   Intake/Output Summary (Last 24 hours) at 08/23/2020 0806 Last data filed at 08/23/2020 0524 Gross per 24 hour  Intake 720 ml  Output 2300 ml  Net -1580 ml   Filed Weights   08/21/20 0740 08/22/20 0353 08/23/20 0517  Weight: (!) 137.5 kg (!) 141 kg (!) 142 kg    Exam: General exam: In no acute distress. Respiratory system: Good air movement and clear to auscultation. Cardiovascular system: S1 & S2 heard, RRR. No JVD. Gastrointestinal system: Abdomen is nondistended, soft and nontender.  Extremities: No pedal edema. Skin: No rashes, lesions or ulcers  Data Reviewed:    Labs: Basic Metabolic Panel: Recent Labs  Lab 08/16/20 1459 08/17/20 0351 08/19/20 0815 08/19/20 0815 08/20/20 0219 08/20/20 0219 08/21/20 0147 08/21/20 0147 08/22/20 0903 08/23/20 0241  NA 139   < > 142  --  140  --  139  --  139 137  K 4.2   < > 4.0   < > 4.1   < > 4.2   < > 3.9 4.1  CL 95*   < > 99  --  97*  --  97*  --  95* 97*  CO2 29   < >  33*  --  34*  --  33*  --  33* 31  GLUCOSE 209*   < > 154*  --  177*  --  174*  --  345* 225*  BUN 22   < > 20  --  21  --  25*  --  23 21  CREATININE 1.41*   < > 1.36*  --  1.69*  --  1.65*  --  1.58* 1.43*  CALCIUM 9.5   < > 9.1  --  9.0  --  8.8*  --  9.2 8.9  MG 2.0  --   --   --   --   --   --   --   --   --    < > = values in this interval not displayed.   GFR Estimated Creatinine Clearance: 73.3 mL/min (A) (by C-G formula based on SCr of 1.43 mg/dL (H)). Liver Function Tests: No results for input(s): AST, ALT, ALKPHOS, BILITOT, PROT, ALBUMIN in the last 168 hours. No results for input(s): LIPASE, AMYLASE in the last 168 hours. No results for input(s): AMMONIA in the last 168 hours. Coagulation profile No results for input(s): INR,  PROTIME in the last 168 hours. COVID-19 Labs  No results for input(s): DDIMER, FERRITIN, LDH, CRP in the last 72 hours.  Lab Results  Component Value Date   SARSCOV2NAA NEGATIVE 08/15/2020    CBC: Recent Labs  Lab 08/16/20 1459 08/16/20 1459 08/17/20 0351 08/18/20 0320 08/18/20 1327 08/18/20 1331 08/19/20 0219  WBC 10.8*  --  13.9* 12.3*  --   --  8.9  HGB 10.6*   < > 10.1* 9.8* 11.2* 11.2* 9.9*  HCT 36.1*   < > 33.9* 33.9* 33.0* 33.0* 34.8*  MCV 83.8  --  84.3 86.0  --   --  86.1  PLT 340  --  322 307  --   --  279   < > = values in this interval not displayed.   Cardiac Enzymes: No results for input(s): CKTOTAL, CKMB, CKMBINDEX, TROPONINI in the last 168 hours. BNP (last 3 results) No results for input(s): PROBNP in the last 8760 hours. CBG: Recent Labs  Lab 08/22/20 0607 08/22/20 1135 08/22/20 1621 08/22/20 2129 08/23/20 0609  GLUCAP 162* 279* 180* 229* 178*   D-Dimer: No results for input(s): DDIMER in the last 72 hours. Hgb A1c: No results for input(s): HGBA1C in the last 72 hours. Lipid Profile: No results for input(s): CHOL, HDL, LDLCALC, TRIG, CHOLHDL, LDLDIRECT in the last 72 hours. Thyroid function studies: No results for input(s): TSH, T4TOTAL, T3FREE, THYROIDAB in the last 72 hours.  Invalid input(s): FREET3 Anemia work up: No results for input(s): VITAMINB12, FOLATE, FERRITIN, TIBC, IRON, RETICCTPCT in the last 72 hours. Sepsis Labs: Recent Labs  Lab 08/16/20 1459 08/17/20 0351 08/18/20 0320 08/19/20 0219  WBC 10.8* 13.9* 12.3* 8.9   Microbiology Recent Results (from the past 240 hour(s))  Respiratory Panel by RT PCR (Flu A&B, Covid) - Nasopharyngeal Swab     Status: None   Collection Time: 08/15/20  5:19 PM   Specimen: Nasopharyngeal Swab  Result Value Ref Range Status   SARS Coronavirus 2 by RT PCR NEGATIVE NEGATIVE Final    Comment: (NOTE) SARS-CoV-2 target nucleic acids are NOT DETECTED.  The SARS-CoV-2 RNA is generally  detectable in upper respiratoy specimens during the acute phase of infection. The lowest concentration of SARS-CoV-2 viral copies this assay can detect is 131 copies/mL. A negative result does  not preclude SARS-Cov-2 infection and should not be used as the sole basis for treatment or other patient management decisions. A negative result may occur with  improper specimen collection/handling, submission of specimen other than nasopharyngeal swab, presence of viral mutation(s) within the areas targeted by this assay, and inadequate number of viral copies (<131 copies/mL). A negative result must be combined with clinical observations, patient history, and epidemiological information. The expected result is Negative.  Fact Sheet for Patients:  https://www.moore.com/  Fact Sheet for Healthcare Providers:  https://www.young.biz/  This test is no t yet approved or cleared by the [place] FDA and  has been authorized for detection and/or diagnosis of SARS-CoV-2 by FDA under an Emergency Use Authorization (EUA). This EUA will remain  in effect (meaning this test can be used) for the duration of the COVID-19 declaration under Section 564(b)(1) of the Act, 21 U.S.C. section 360bbb-3(b)(1), unless the authorization is terminated or revoked sooner.     Influenza A by PCR NEGATIVE NEGATIVE Final   Influenza B by PCR NEGATIVE NEGATIVE Final    Comment: (NOTE) The Xpert Xpress SARS-CoV-2/FLU/RSV assay is intended as an aid in  the diagnosis of influenza from Nasopharyngeal swab specimens and  should not be used as a sole basis for treatment. Nasal washings and  aspirates are unacceptable for Xpert Xpress SARS-CoV-2/FLU/RSV  testing.  Fact Sheet for Patients: https://www.moore.com/  Fact Sheet for Healthcare Providers: https://www.young.biz/  This test is not yet approved or cleared by the [place] FDA and    has been authorized for detection and/or diagnosis of SARS-CoV-2 by  FDA under an Emergency Use Authorization (EUA). This EUA will remain  in effect (meaning this test can be used) for the duration of the  Covid-19 declaration under Section 564(b)(1) of the Act, 21  U.S.C. section 360bbb-3(b)(1), unless the authorization is  terminated or revoked. Performed at [facility], [address] [place], [place] [address]   Culture, blood (routine x 2) Call MD if unable to obtain prior to antibiotics being given     Status: None   Collection Time: 08/16/20  9:04 PM   Specimen: BLOOD  Result Value Ref Range Status   Specimen Description BLOOD RIGHT ARM  Final   Special Requests   Final    BOTTLES DRAWN AEROBIC AND ANAEROBIC Blood Culture results may not be optimal due to an excessive volume of blood received in culture bottles   Culture   Final    NO GROWTH 5 DAYS Performed at [facility], [address] [place], [place] [address]    Report Status 08/21/2020 FINAL  Final  Culture, blood (routine x 2) Call MD if unable to obtain prior to antibiotics being given     Status: None   Collection Time: 08/16/20  9:05 PM   Specimen: BLOOD RIGHT HAND  Result Value Ref Range Status   Specimen Description BLOOD RIGHT HAND  Final   Special Requests   Final    BOTTLES DRAWN AEROBIC ONLY Blood Culture adequate volume   Culture   Final    NO GROWTH 5 DAYS Performed at [facility], [address] [place], [place] [address]    Report Status 08/21/2020 FINAL  Final     Medications:   . aspirin EC  81 mg Oral Daily  . chlorhexidine  15 mL Mouth Rinse BID  . cholecalciferol  1,000 Units Oral QHS  . cloNIDine  0.1 mg Oral BID  .  diltiazem  240 mg Oral Daily  . folic acid  1 mg Oral Daily  . gabapentin  600 mg Oral BID  . heparin  5,000 Units Subcutaneous Q8H  . insulin aspart  0-9 Units Subcutaneous TID WC  . latanoprost  1 drop Both Eyes QHS  . mouth rinse  15 mL  Mouth Rinse q12n4p  . metoprolol tartrate  25 mg Oral BID  . multivitamin with minerals  1 tablet Oral QHS  . oxyCODONE  10 mg Oral Q12H  . pantoprazole  40 mg Oral BID  . rosuvastatin  20 mg Oral Daily  . sodium chloride flush  3 mL Intravenous Q12H   Continuous Infusions: . sodium chloride 140 mL/hr at 08/18/20 0918  . sodium chloride    . lactated ringers 10 mL/hr at 08/21/20 1655      LOS: 7 days   Marinda Elk  [facility]  08/23/2020, 8:06 AM

## 2020-08-24 ENCOUNTER — Encounter: Payer: Self-pay | Admitting: Physician Assistant

## 2020-08-24 ENCOUNTER — Other Ambulatory Visit: Payer: Self-pay

## 2020-08-24 ENCOUNTER — Encounter (HOSPITAL_COMMUNITY): Payer: Self-pay | Admitting: Cardiology

## 2020-08-24 DIAGNOSIS — I350 Nonrheumatic aortic (valve) stenosis: Secondary | ICD-10-CM

## 2020-08-24 NOTE — Anesthesia Postprocedure Evaluation (Signed)
Anesthesia Post Note  Patient: Lawrence Rose  Procedure(s) Performed: TRANSESOPHAGEAL ECHOCARDIOGRAM (TEE) (N/A )     Patient location during evaluation: Endoscopy Anesthesia Type: MAC Level of consciousness: awake and alert Pain management: pain level controlled Vital Signs Assessment: post-procedure vital signs reviewed and stable Respiratory status: spontaneous breathing, nonlabored ventilation, respiratory function stable and patient connected to nasal cannula oxygen Cardiovascular status: blood pressure returned to baseline and stable Postop Assessment: no apparent nausea or vomiting Anesthetic complications: no   No complications documented.  Last Vitals:  Vitals:   08/23/20 0917 08/23/20 1148  BP: (!) 159/81 132/73  Pulse: 90 69  Resp: 20 20  Temp: 36.8 C 36.8 C  SpO2: 96% 97%    Last Pain:  Vitals:   08/23/20 1148  TempSrc: Oral  PainSc:                  [place]

## 2020-08-27 ENCOUNTER — Other Ambulatory Visit: Payer: Self-pay

## 2020-08-27 ENCOUNTER — Encounter (HOSPITAL_COMMUNITY): Payer: Self-pay

## 2020-08-27 ENCOUNTER — Encounter (HOSPITAL_COMMUNITY)
Admission: RE | Admit: 2020-08-27 | Discharge: 2020-08-27 | Disposition: A | Discharge status: Home / Self Care | Payer: Medicare HMO | Source: Ambulatory Visit | Attending: Cardiovascular Disease | Admitting: Cardiovascular Disease

## 2020-08-27 ENCOUNTER — Ambulatory Visit (HOSPITAL_COMMUNITY)
Admission: RE | Admit: 2020-08-27 | Discharge: 2020-08-27 | Disposition: A | Discharge status: Home / Self Care | Payer: Medicare HMO | Source: Ambulatory Visit | Attending: Cardiovascular Disease | Admitting: Cardiovascular Disease

## 2020-08-27 DIAGNOSIS — Z87891 Personal history of nicotine dependence: Secondary | ICD-10-CM | POA: Diagnosis not present

## 2020-08-27 DIAGNOSIS — Z7984 Long term (current) use of oral hypoglycemic drugs: Secondary | ICD-10-CM | POA: Diagnosis not present

## 2020-08-27 DIAGNOSIS — G4733 Obstructive sleep apnea (adult) (pediatric): Secondary | ICD-10-CM | POA: Diagnosis not present

## 2020-08-27 DIAGNOSIS — I110 Hypertensive heart disease with heart failure: Secondary | ICD-10-CM | POA: Diagnosis not present

## 2020-08-27 DIAGNOSIS — I5021 Acute systolic (congestive) heart failure: Secondary | ICD-10-CM | POA: Insufficient documentation

## 2020-08-27 DIAGNOSIS — I350 Nonrheumatic aortic (valve) stenosis: Secondary | ICD-10-CM | POA: Insufficient documentation

## 2020-08-27 DIAGNOSIS — I252 Old myocardial infarction: Secondary | ICD-10-CM | POA: Insufficient documentation

## 2020-08-27 DIAGNOSIS — K219 Gastro-esophageal reflux disease without esophagitis: Secondary | ICD-10-CM | POA: Insufficient documentation

## 2020-08-27 DIAGNOSIS — Z01818 Encounter for other preprocedural examination: Secondary | ICD-10-CM | POA: Insufficient documentation

## 2020-08-27 DIAGNOSIS — Z79899 Other long term (current) drug therapy: Secondary | ICD-10-CM | POA: Insufficient documentation

## 2020-08-27 DIAGNOSIS — R0989 Other specified symptoms and signs involving the circulatory and respiratory systems: Secondary | ICD-10-CM | POA: Insufficient documentation

## 2020-08-27 DIAGNOSIS — J45909 Unspecified asthma, uncomplicated: Secondary | ICD-10-CM | POA: Diagnosis not present

## 2020-08-27 DIAGNOSIS — E119 Type 2 diabetes mellitus without complications: Secondary | ICD-10-CM | POA: Diagnosis not present

## 2020-08-27 DIAGNOSIS — I352 Nonrheumatic aortic (valve) stenosis with insufficiency: Secondary | ICD-10-CM | POA: Insufficient documentation

## 2020-08-27 DIAGNOSIS — Z7982 Long term (current) use of aspirin: Secondary | ICD-10-CM | POA: Insufficient documentation

## 2020-08-27 DIAGNOSIS — I700 Atherosclerosis of aorta: Secondary | ICD-10-CM | POA: Insufficient documentation

## 2020-08-27 HISTORY — DX: Carpal tunnel syndrome, unspecified upper limb: G56.00

## 2020-08-27 HISTORY — DX: Other specified arthritis, unspecified hand: M13.849

## 2020-08-27 HISTORY — DX: Nonrheumatic aortic (valve) stenosis: I35.0

## 2020-08-27 HISTORY — DX: Unspecified asthma, uncomplicated: J45.909

## 2020-08-27 HISTORY — DX: Gastro-esophageal reflux disease without esophagitis: K21.9

## 2020-08-27 LAB — COMPREHENSIVE METABOLIC PANEL WITH GFR
ALT: 18 U/L (ref 0–44)
AST: 21 U/L (ref 15–41)
Albumin: 3.6 g/dL (ref 3.5–5.0)
Alkaline Phosphatase: 46 U/L (ref 38–126)
Anion gap: 12 (ref 5–15)
BUN: 20 mg/dL (ref 8–23)
CO2: 25 mmol/L (ref 22–32)
Calcium: 9.5 mg/dL (ref 8.9–10.3)
Chloride: 100 mmol/L (ref 98–111)
Creatinine, Ser: 1.31 mg/dL — ABNORMAL HIGH (ref 0.61–1.24)
GFR, Estimated: >60 mL/min (ref >60)
Glucose, Bld: 139 mg/dL — ABNORMAL HIGH (ref 70–99)
Potassium: 4.5 mmol/L (ref 3.5–5.1)
Sodium: 137 mmol/L (ref 135–145)
Total Bilirubin: 0.4 mg/dL (ref 0.3–1.2)
Total Protein: 7.5 g/dL (ref 6.5–8.1)

## 2020-08-27 LAB — URINALYSIS, ROUTINE W REFLEX MICROSCOPIC
Bilirubin Urine: NEGATIVE
Glucose, UA: NEGATIVE mg/dL
Hgb urine dipstick: NEGATIVE
Ketones, ur: NEGATIVE mg/dL
Leukocytes,Ua: NEGATIVE
Nitrite: NEGATIVE
Protein, ur: NEGATIVE mg/dL
Specific Gravity, Urine: 1.009 (ref 1.005–1.030)
pH: 6 (ref 5.0–8.0)

## 2020-08-27 LAB — CBC
HCT: 37 % — ABNORMAL LOW (ref 39.0–52.0)
Hemoglobin: 10.9 g/dL — ABNORMAL LOW (ref 13.0–17.0)
MCH: 24.9 pg — ABNORMAL LOW (ref 26.0–34.0)
MCHC: 29.5 g/dL — ABNORMAL LOW (ref 30.0–36.0)
MCV: 84.7 fL (ref 80.0–100.0)
Platelets: 274 10*3/uL (ref 150–400)
RBC: 4.37 MIL/uL (ref 4.22–5.81)
RDW: 15 % (ref 11.5–15.5)
WBC: 7 10*3/uL (ref 4.0–10.5)
nRBC: 0 % (ref 0.0–0.2)

## 2020-08-27 LAB — BLOOD GAS, ARTERIAL
Acid-Base Excess: 5 mmol/L — ABNORMAL HIGH (ref 0.0–2.0)
Bicarbonate: 29.8 mmol/L — ABNORMAL HIGH (ref 20.0–28.0)
Drawn by: 58793
FIO2: 21
O2 Saturation: 94.7 %
Patient temperature: 37
pCO2 arterial: 51.2 mmHg — ABNORMAL HIGH (ref 32.0–48.0)
pH, Arterial: 7.383 (ref 7.350–7.450)
pO2, Arterial: 74.7 mmHg — ABNORMAL LOW (ref 83.0–108.0)

## 2020-08-27 LAB — PROTIME-INR
INR: 0.9 (ref 0.8–1.2)
Prothrombin Time: 12.2 s (ref 11.4–15.2)

## 2020-08-27 LAB — SURGICAL PCR SCREEN
MRSA, PCR: NEGATIVE
Staphylococcus aureus: NEGATIVE

## 2020-08-27 LAB — GLUCOSE, CAPILLARY: Glucose-Capillary: 183 mg/dL — ABNORMAL HIGH (ref 70–99)

## 2020-08-27 LAB — TYPE AND SCREEN
ABO/RH(D): A NEG
Antibody Screen: NEGATIVE

## 2020-08-27 LAB — APTT: aPTT: 29 seconds (ref 24–36)

## 2020-08-27 LAB — HEMOGLOBIN A1C
Hgb A1c MFr Bld: 6.1 % — ABNORMAL HIGH (ref 4.8–5.6)
Mean Plasma Glucose: 128.37 mg/dL

## 2020-08-27 LAB — BRAIN NATRIURETIC PEPTIDE: B Natriuretic Peptide: 48.3 pg/mL (ref 0.0–100.0)

## 2020-08-27 NOTE — Progress Notes (Signed)
PCP - Dr. Lenox Ponds Cardiologist - denies  PPM/ICD - denies  Chest x-ray - 08/27/2020 EKG - 08/18/2020 Stress Test - denies ECHO - 08/17/2020 Cardiac Cath - 08/18/2020  OSA/CPAP - per patient, wears CPAP every night   Fasting Blood Sugar - 70 - 100 Checks Blood Sugar about once a week  Blood Thinner Instructions: N/A Aspirin Instructions: per surgeon's order to continue taking as prescribed   ERAS Protcol - No  COVID TEST- Scheduled for 08/29/2020. Patient verbalized understanding of self-quarantine instructions, appointment time and place.  Anesthesia review: YESm cardiac hx  Patient denies shortness of breath, fever, cough and chest pain at PAT appointment  All instructions explained to the patient, with a verbal understanding of the material. Patient agrees to go over the instructions while at home for a better understanding. Patient also instructed to self quarantine after being tested for COVID-19. The opportunity to ask questions was provided.

## 2020-08-27 NOTE — Progress Notes (Signed)
Your procedure is scheduled on Tuesday, November 16th.  Report to [facility] Main Entrance "A" at 7:15 A.M., and check in at the Admitting office.  Call this number if you have problems the morning of surgery:  [phone]  Call [phone] if you have any questions prior to your surgery date Monday-Friday 8am-4pm   Remember:  Do not eat or drink after midnight the night before your surgery    Take these NO medicines the morning of surgery.  As of today, STOP taking any Aspirin (unless otherwise instructed by your surgeon) Aleve, Naproxen, Ibuprofen, Motrin, Advil, Goody's, BC's, all herbal medications, fish oil, and all vitamins.         WHAT DO I DO ABOUT MY DIABETES MEDICATION?  Per surgeon's order: Stop taking metformin on 08/30/2020  HOW TO MANAGE YOUR DIABETES BEFORE AND AFTER SURGERY  Why is it important to control my blood sugar before and after surgery? . Improving blood sugar levels before and after surgery helps healing and can limit problems. . A way of improving blood sugar control is eating a healthy diet by: o  Eating less sugar and carbohydrates o  Increasing activity/exercise o  Talking with your doctor about reaching your blood sugar goals . High blood sugars (greater than 180 mg/dL) can raise your risk of infections and slow your recovery, so you will need to focus on controlling your diabetes during the weeks before surgery. . Make sure that the doctor who takes care of your diabetes knows about your planned surgery including the date and location.  How do I manage my blood sugar before surgery? . Check your blood sugar at least 4 times a day, starting 2 days before surgery, to make sure that the level is not too high or low. . Check your blood sugar the morning of your surgery when you wake up and every 2 hours until you get to the Short Stay unit. o If your blood sugar is less than 70 mg/dL, you will need to treat for low blood sugar: - Do not take  insulin. - Treat a low blood sugar (less than 70 mg/dL) with  cup of clear juice (cranberry or apple), 4 glucose tablets, OR glucose gel. - Recheck blood sugar in 15 minutes after treatment (to make sure it is greater than 70 mg/dL). If your blood sugar is not greater than 70 mg/dL on recheck, call [phone] for further instructions. . Report your blood sugar to the short stay nurse when you get to Short Stay.  . If you are admitted to the hospital after surgery: o Your blood sugar will be checked by the staff and you will probably be given insulin after surgery (instead of oral diabetes medicines) to make sure you have good blood sugar levels. o The goal for blood sugar control after surgery is 80-180 mg/dL.             Do not wear jewelry.            Do not wear lotions, powders, colognes, or deodorant.            Men may shave face and neck.            Do not bring valuables to the hospital.            [facility] is not responsible for any belongings or valuables.  Do NOT Smoke (Tobacco/Vaping) or drink Alcohol 24 hours prior to your procedure If you use a CPAP at night,  you may bring all equipment for your overnight stay.   Contacts, glasses, dentures or bridgework may not be worn into surgery.      For patients admitted to the hospital, discharge time will be determined by your treatment team.   Patients discharged the day of surgery will not be allowed to drive home, and someone needs to stay with them for 24 hours.  Special instructions:   [place]- Preparing For Surgery  Before surgery, you can play an important role. Because skin is not sterile, your skin needs to be as free of germs as possible. You can reduce the number of germs on your skin by washing with CHG (chlorahexidine gluconate) Soap before surgery.  CHG is an antiseptic cleaner which kills germs and bonds with the skin to continue killing germs even after washing.    Oral Hygiene is also important to reduce  your risk of infection.  Remember - BRUSH YOUR TEETH THE MORNING OF SURGERY WITH YOUR REGULAR TOOTHPASTE  Please do not use if you have an allergy to CHG or antibacterial soaps. If your skin becomes reddened/irritated stop using the CHG.  Do not shave (including legs and underarms) for at least 48 hours prior to first CHG shower. It is OK to shave your face.  Please follow these instructions carefully.   1. Shower the NIGHT BEFORE SURGERY and the MORNING OF SURGERY with CHG Soap.   2. If you chose to wash your hair, wash your hair first as usual with your normal shampoo.  3. After you shampoo, rinse your hair and body thoroughly to remove the shampoo.  4. Use CHG as you would any other liquid soap. You can apply CHG directly to the skin and wash gently with a scrungie or a clean washcloth.   5. Apply the CHG Soap to your body ONLY FROM THE NECK DOWN.  Do not use on open wounds or open sores. Avoid contact with your eyes, ears, mouth and genitals (private parts). Wash Face and genitals (private parts)  with your normal soap.   6. Wash thoroughly, paying special attention to the area where your surgery will be performed.  7. Thoroughly rinse your body with warm water from the neck down.  8. DO NOT shower/wash with your normal soap after using and rinsing off the CHG Soap.  9. Pat yourself dry with a CLEAN TOWEL.  10. Wear CLEAN PAJAMAS to bed the night before surgery  11. Place CLEAN SHEETS on your bed the night of your first shower and DO NOT SLEEP WITH PETS.  Day of Surgery: Wear Clean/Comfortable clothing the morning of surgery Do not apply any deodorants/lotions.   Remember to brush your teeth WITH YOUR REGULAR TOOTHPASTE.   Please read over the following fact sheets that you were given.

## 2020-08-28 ENCOUNTER — Other Ambulatory Visit (HOSPITAL_COMMUNITY): Payer: Medicare HMO

## 2020-08-28 NOTE — Progress Notes (Addendum)
Anesthesia Chart Review:  Case: [id number] Date/Time: 09/01/20 0900   Procedures:      TRANSCATHETER AORTIC VALVE REPLACEMENT, TRANSFEMORAL (N/A )     TRANSESOPHAGEAL ECHOCARDIOGRAM (TEE) (N/A )   Anesthesia type: General   Pre-op diagnosis: Severe Aortic Stenosis   Location: [facility] / [facility]   Providers: [name], MD      DISCUSSION: Patient is a 64 year old male scheduled for the above procedure.  History includes former smoker, severe AS, DM2, HTN, OSA (wears CPAP), asthma, GERD, back surgery. BMI is consistent with morbid obesity. - Admission 08/15/20-09/02/20 for acute respiratory failure with hypoxia and diagnosed with NSTEMI (demand ischemia), severe AS with pulmonary edema/acaute diastolic CHF, AKI. Cardiologist Bess Kinds, MD and CT surgeon Gilford Raid, MD consulted. Had TAVR work-up while hospitalized. Cath showed normal coronaries, AS, severe pulmonary hypertension, high cardiac output (thyroid lab testing recommended: TSH 0.176 L, normal Free T4 and free Triiodothyronine).   Notes indicate that he has received J&J COVID-19 vaccine. Preoperative COVID-19 test is scheduled for 08/29/20.    VS: BP 134/73    Pulse 80    Temp 36.8 C (Oral)    Resp 19    Ht '5\' 11"'  (1.803 m)    Wt (!) 139.4 kg    SpO2 99%    BMI 42.87 kg/m   PROVIDERS: [name], [name], MD his PCP   LABS: Preoperative labs reviewed and are marked as reviewed by Dr. Angelena Form. Cr 1.31.  (all labs ordered are listed, but only abnormal results are displayed)  Labs Reviewed  GLUCOSE, CAPILLARY - Abnormal; Notable for the following components:      Result Value   Glucose-Capillary 183 (*)    All other components within normal limits  BLOOD GAS, ARTERIAL - Abnormal; Notable for the following components:   pCO2 arterial 51.2 (*)    pO2, Arterial 74.7 (*)    Bicarbonate 29.8 (*)    Acid-Base Excess 5.0 (*)    Allens test (pass/fail) BRACHIAL ARTERY (*)    All other  components within normal limits  CBC - Abnormal; Notable for the following components:   Hemoglobin 10.9 (*)    HCT 37.0 (*)    MCH 24.9 (*)    MCHC 29.5 (*)    All other components within normal limits  COMPREHENSIVE METABOLIC PANEL - Abnormal; Notable for the following components:   Glucose, Bld 139 (*)    Creatinine, Ser 1.31 (*)    All other components within normal limits  HEMOGLOBIN A1C - Abnormal; Notable for the following components:   Hgb A1c MFr Bld 6.1 (*)    All other components within normal limits  SURGICAL PCR SCREEN  APTT  BRAIN NATRIURETIC PEPTIDE  PROTIME-INR  URINALYSIS, ROUTINE W REFLEX MICROSCOPIC  TYPE AND SCREEN    PFTs 08/21/20: FVC 2.65 (65%), post 2.41 (59%). FEV1 1.94 (62%), post 1.87 (60%). DLCO unc 17.86 (65%), cor 21.36 (77%).   IMAGES: CXR 08/27/20: FINDINGS: Cardiomegaly. Aortic atherosclerosis. Redemonstrated prominent epicardial fat. Central pulmonary vascular congestion without overt pulmonary edema. No appreciable airspace consolidation. No evidence of pleural effusion or pneumothorax. No acute bony abnormality identified. IMPRESSION: Unchanged cardiomegaly. Central pulmonary vascular congestion without overt pulmonary edema. Aortic Atherosclerosis (ICD10-I70.0).   EKG: 08/18/20: Sinus rhythm with Premature atrial complexes Low voltage QRS Cannot rule out Inferior infarct , age undetermined Abnormal ECG No significant change since last tracing Confirmed by Vernell Leep [id number]) on 08/18/2020 9:55:51 PM   CV: TEE 08/21/20:  IMPRESSIONS  1. Severe AS (mean gradient 42 mmHg and peak velocity of 4.2 m/s); mild  AI.  2. Left ventricular ejection fraction, by estimation, is 70 to 75%. The  left ventricle has hyperdynamic function. There is severe left ventricular  hypertrophy.  3. Right ventricular systolic function is normal. The right ventricular  size is normal.  4. Left atrial size was moderately dilated. No left  atrial/left atrial  appendage thrombus was detected.  5. The mitral valve is normal in structure. Trivial mitral valve  regurgitation.  6. The aortic valve is tricuspid. Aortic valve regurgitation is mild.  Severe aortic valve stenosis.  7. There is Moderate (Grade III) plaque involving the descending aorta.    CT Coronary 08/19/20: IMPRESSION: 1. Tri-leaflet AV with calcium score 1645 and annular area of 505 mm2 suitable for a 26 mm Sapien 3 valve 2.  Coronary arteries sufficient height above annulus for deployment 3.  Optimum angiographic angle for deployment LAO 3 Caudal 6 degrees 4. Some nodular calcification in annulus at base of left coronary cusp 5.  Normal aortic root 3.3 cm   RHC/LHC 08/18/20:  Widely patent coronary arteries  High cardiac output: 9.06 L/min by Fick and 9.23 L/min by thermal dilution.  Etiology uncertain.  Suppressed TSH with normal T4 raising question of T3 thyrotoxicosis versus other mechanism.  Severe pulmonary hypertension, mean pressure 48 mmHg, based upon hemodynamics WHO group 2  Pulmonary capillary wedge pressure mean 34 mmHg, pulmonary vascular resistance 1.55 Woods units.  Mean right atrial pressure 19 mmHg  Aortic valve gradient peak to peak 33 mmHg with mean gradient 22 mmHg.  Aortic valve area 1.94 cm by Fick cardiac output and 1.98 cm by thermodilution  LV function normal by echocardiography with moderate LVH   Carotid US 08/19/20: Summary:  Right Carotid: Velocities in the right ICA are consistent with a 1-39%  stenosis.         The ECA appears <50% stenosed.  Left Carotid: Velocities in the left ICA are consistent with a 1-39%  stenosis.        The ECA appears <50% stenosed.  Vertebrals: Bilateral vertebral arteries demonstrate antegrade flow.   Past Medical History:  Diagnosis Date   Acid reflux    Allergic arthritis, hand    per patient, both hands   Apnea    Asthma    Carpal tunnel syndrome     per patient, both hands   Chronic back pain    Chronic knee pain    Diabetes mellitus without complication (HCC)    Hypertension    Severe aortic stenosis    Sleep apnea    wears CPAP every night    Past Surgical History:  Procedure Laterality Date   BACK SURGERY     RIGHT/LEFT HEART CATH AND CORONARY ANGIOGRAPHY N/A 08/18/2020   Procedure: RIGHT/LEFT HEART CATH AND CORONARY ANGIOGRAPHY;  Surgeon: [name], MD;  Location: [place] [facility];  Service: Cardiovascular;  Laterality: N/A;   TEE WITHOUT CARDIOVERSION N/A 08/21/2020   Procedure: TRANSESOPHAGEAL ECHOCARDIOGRAM (TEE);  Surgeon: [name], MD;  Location: [facility] ENDOSCOPY;  Service: Cardiovascular;  Laterality: N/A;    MEDICATIONS:  albuterol (VENTOLIN HFA) 108 (90 Base) MCG/ACT inhaler   aspirin EC 81 MG tablet   Blood Glucose Monitoring Suppl (FIFTY50 GLUCOSE METER 2.0) w/Device KIT   Cholecalciferol 25 MCG (1000 UT) tablet   furosemide (LASIX) 40 MG tablet   gabapentin (NEURONTIN) 300 MG capsule   HYDROcodone-acetaminophen (NORCO) 10-325 MG  tablet   latanoprost (XALATAN) 0.005 % ophthalmic solution   lisinopril (ZESTRIL) 40 MG tablet   metFORMIN (GLUCOPHAGE) 1000 MG tablet   metoprolol tartrate (LOPRESSOR) 25 MG tablet   Multiple Vitamin (MULTIVITAMIN WITH MINERALS) TABS tablet   oxyCODONE ER (XTAMPZA ER) 9 MG C12A   pantoprazole (PROTONIX) 40 MG tablet   potassium chloride SA (KLOR-CON) 20 MEQ tablet   PRESCRIPTION MEDICATION   rosuvastatin (CRESTOR) 20 MG tablet   No current facility-administered medications for this encounter.    Myra Gianotti, PA-C Surgical Short Stay/Anesthesiology [facility] Phone [phone] [facility] Phone [phone] 08/28/2020 12:20 PM

## 2020-08-28 NOTE — Anesthesia Preprocedure Evaluation (Addendum)
Anesthesia Evaluation  Patient identified by MRN, date of birth, ID band Patient awake    Reviewed: Allergy & Precautions, NPO status , Patient's Chart, lab work & pertinent test results  Airway Mallampati: III  TM Distance: >3 FB Neck ROM: Full    Dental  (+) Dental Advisory Given   Pulmonary asthma , sleep apnea and Continuous Positive Airway Pressure Ventilation , former smoker,    breath sounds clear to auscultation       Cardiovascular hypertension, Pt. on medications and Pt. on home beta blockers + Past MI  + Valvular Problems/Murmurs AS  Rhythm:Regular Rate:Normal     Neuro/Psych negative neurological ROS     GI/Hepatic Neg liver ROS, GERD  ,  Endo/Other  diabetes  Renal/GU CRFRenal disease     Musculoskeletal  (+) Arthritis ,   Abdominal   Peds  Hematology  (+) anemia ,   Anesthesia Other Findings   Reproductive/Obstetrics                          Lab Results  Component Value Date   WBC 7.0 08/27/2020   HGB 10.9 (L) 08/27/2020   HCT 37.0 (L) 08/27/2020   MCV 84.7 08/27/2020   PLT 274 08/27/2020   Lab Results  Component Value Date   CREATININE 1.31 (H) 08/27/2020   BUN 20 08/27/2020   NA 137 08/27/2020   K 4.5 08/27/2020   CL 100 08/27/2020   CO2 25 08/27/2020    Anesthesia Physical Anesthesia Plan  ASA: IV  Anesthesia Plan: MAC   Post-op Pain Management:    Induction:   PONV Risk Score and Plan: 1 and Propofol infusion, Ondansetron and Treatment may vary due to age or medical condition  Airway Management Planned: Natural Airway and Simple Face Mask  Additional Equipment: Arterial line  Intra-op Plan:   Post-operative Plan:   Informed Consent: I have reviewed the patients History and Physical, chart, labs and discussed the procedure including the risks, benefits and alternatives for the proposed anesthesia with the patient or authorized representative who  has indicated his/her understanding and acceptance.     Dental advisory given  Plan Discussed with: CRNA  Anesthesia Plan Comments: ( )      Anesthesia Quick Evaluation

## 2020-08-29 ENCOUNTER — Other Ambulatory Visit (HOSPITAL_COMMUNITY)
Admission: RE | Admit: 2020-08-29 | Discharge: 2020-08-29 | Disposition: A | Discharge status: Home / Self Care | Payer: Medicare HMO | Source: Ambulatory Visit | Attending: Cardiovascular Disease | Admitting: Cardiovascular Disease

## 2020-08-29 DIAGNOSIS — Z20822 Contact with and (suspected) exposure to covid-19: Secondary | ICD-10-CM | POA: Insufficient documentation

## 2020-08-29 DIAGNOSIS — Z01812 Encounter for preprocedural laboratory examination: Secondary | ICD-10-CM | POA: Insufficient documentation

## 2020-08-29 LAB — SARS CORONAVIRUS 2 (TAT 6-24 HRS): SARS Coronavirus 2: NEGATIVE

## 2020-08-31 MED ORDER — SODIUM CHLORIDE 0.9 % IV SOLN
1.5000 g | INTRAVENOUS | Status: AC
  Administered 2020-09-01: 1.5 g via INTRAVENOUS
  Filled 2020-08-31: qty 1.5

## 2020-08-31 MED ORDER — NOREPINEPHRINE 4 MG/250ML-% IV SOLN
0.0000 ug/min | INTRAVENOUS | Status: AC
  Administered 2020-09-01: 2 ug/min via INTRAVENOUS
  Filled 2020-08-31: qty 250

## 2020-08-31 MED ORDER — SODIUM CHLORIDE 0.9 % IV SOLN
INTRAVENOUS | Status: DC
  Filled 2020-08-31: qty 30

## 2020-08-31 MED ORDER — VANCOMYCIN HCL 1500 MG/300ML IV SOLN
1500.0000 mg | INTRAVENOUS | Status: AC
  Administered 2020-09-01: 1500 mg via INTRAVENOUS
  Filled 2020-08-31 (×2): qty 300

## 2020-08-31 MED ORDER — POTASSIUM CHLORIDE 2 MEQ/ML IV SOLN
80.0000 meq | INTRAVENOUS | Status: DC
  Filled 2020-08-31: qty 40

## 2020-08-31 MED ORDER — MAGNESIUM SULFATE 50 % IJ SOLN
40.0000 meq | INTRAMUSCULAR | Status: DC
  Filled 2020-08-31: qty 9.85

## 2020-08-31 MED ORDER — DEXMEDETOMIDINE HCL IN NACL 400 MCG/100ML IV SOLN
0.1000 ug/kg/h | INTRAVENOUS | Status: AC
  Administered 2020-09-01: .7 ug/kg/h via INTRAVENOUS
  Filled 2020-08-31 (×2): qty 100

## 2020-08-31 NOTE — H&P (Signed)
[address]       [place] [address]             [phone]      Cardiothoracic Surgery Admission History and Physical   Patient ID: Lawrence Rose  MRN: [id number]; DOB: 1956-07-19  Admit date: 08/15/2020  Date of Consult: 08/19/2020  Primary Care Provider: Pcp, No  [facility] Cardiologist: New - previously seen by [name] - [name] Electrophysiologist: None    History of Present Illness:  Mr. Colegrove is widowed and lives alone in [place], [place]. He has 3 children, but he is not very close with them. He does have good social support with a local sister and brother in [place]. 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 He also suffers from advanced bilateral knee osteoarthritis. Due to his orthopedic issues and morbid obesity, he has very limited mobility and has to walk with the aid of a walker or cane. The patient is sedentary at baseline, but is still able to drive and take care of his own ADLs. The most exercise he gets is walking out to his car to drive to the store, church or medical appointments. Patient was recently seen by a dentist who extracted all of his problematic teeth and made him partial dentures. Of note, he was vaccinated with the [place] for COVID-19.  The patient is not followed by cardiology and has no previous cardiac history. He reports being told he had a murmur at least 2 years ago. He was seen previously by Dr. [name] in 2016 for pre op clearance before colonoscopy due to reportedly abnormal ECG. Myoview was abnormal and follow up cath was normal. There is an echo in care everywhere from 08/10/2018 from [facility] that reports normal LV function with an EF of 55 to 60%, normal aortic valve and mild mitral stenosis.  The patient reports slowing down over the past 6 months with worsening fatigue and dyspnea on exertion.  Over the past week or so he has noted worsening in his shortness of breath to the point that he could not walk to the bathroom without significant dyspnea. He reported "knowing something was wrong" and called his brother to bring him to [facility] ED for evaluation. In the ED, BNP was noted to be elevated to 246.2, HS troponin 330--> 175, hemoglobin 10.1, WBC 8.4, TSH 0.0176, T4 0.77, respiratory panel negative, hemoglobin A1c 5.8, D-dimer 1.02, CXR with possible bilateral infiltrates, CT angio with no evidence of a pulmonary embolus, bilateral pleural effusions and patchy bilateral groundglass opacities in the upper lobes concerning for pneumonia, lower extremity Dopplers negative for DVT. Transthoracic echocardiogram showed EF 60 to 65%, moderate concentric LVH, indeterminate diastolic function, severe left atrial dilation, mild mitral stenosis (mean gradient of 6 mm Hg at HR of 101bpm), severe aortic valve stenosis with an AVA 0.9 cm, mean gradient 51 mmHg, peak gradient 82 mmHg, V-max 4.65 cm, DVI 0.2. He was started on IV heparin, Lasix, Rocephin and azithromycin and transferred to [facility] for further evaluation and treatment. He underwent left and right heart catheterization on 08/18/2020 which showed widely patent coronary arteries, high cardiac output at 9.06 L/min by Fick and 9.23 L/min by thermal dilution, severe pulmonary hypertension with a mean pressure of 48 mmHg, pulmonary capillary wedge pressure mean 34 mmHg, pulmonary vascular resistance  1.55 Woods units, mean right atrial pressure 19 mmHg, aortic valve peak gradient of 33 mmHg, mean gradient 22 mmHg, aortic valve area 1.94 cm by Fick and 1.98 cm by thermal dilution, LVEDP 20 mmHg. Further diuresis was recommended. There was some question of his high cardiac output being related to possible thyrotoxicosis as he was noted to have a suppressed TSH with a normal T4. T3 currently pending. ABG from 11/2 showed PH 7.281, pCO2 71, P02 80. Follow up TEE  today showed EF 75%, severe LVH, severe AS with a mean gradient of 42 mm Hg and mild AI, trivial MR and moderate plaque in descending aorta. Cardiac gated CTA of the heart reveals anatomical characteristics consistent with aortic stenosis suitable for treatment by transcatheter aortic valve replacement without any significant complicating features except some nodular calcium in the annulus at the base of the LCC and CTA of the aorta and iliac vessels demonstrate what appear to be adequate pelvic vascular access to facilitate a transfemoral approach.  The structural heart team is consulted for consideration of aortic valve replacement +/- mitral valve replacement. Today the patient is seen sitting up in bed. He is feeling better after IV diuresis, but continues to have shortness of breath. He denies chest pain, dizziness or syncope, blood in his stool or urine. He has occasional lower extremity edema for which he takes Lasix. He reports his Lasix being cut in half about a month ago given evidence of dehydration. He denies PND or orthopnea, but says he sleeps with his CPAP nightly. He feels palpitations like his heart is racing with even mild level of activity, like walking across the room.      Past Medical History:  Diagnosis Date  . Apnea   . Chronic back pain   . Chronic knee pain   . Diabetes mellitus without complication ([place])   . Hypertension         Past Surgical History:  Procedure Laterality Date  . BACK SURGERY    . RIGHT/LEFT HEART CATH AND CORONARY ANGIOGRAPHY N/A 08/18/2020   Procedure: RIGHT/LEFT HEART CATH AND CORONARY ANGIOGRAPHY; Surgeon: [name], MD; Location: [facility]; Service: Cardiovascular; Laterality: N/A;   Home Medications:         Prior to Admission medications   Medication Sig Start Date End Date Taking? Authorizing Provider  albuterol (VENTOLIN HFA) 108 (90 Base) MCG/ACT inhaler Inhale 2 puffs into the lungs every 4 (four) hours as needed for wheezing  or shortness of breath.  08/22/16  Yes [provider]  aspirin EC 81 MG tablet Take 81 mg by mouth at bedtime. Swallow whole.   Yes [provider]  Cholecalciferol 25 MCG (1000 UT) tablet Take 1,000 Units by mouth at bedtime.    Yes [provider]  cloNIDine (CATAPRES) 0.1 MG tablet Take 0.1 mg by mouth 2 (two) times daily.  06/15/20  Yes [provider]  diltiazem (CARDIZEM CD) 240 MG 24 hr capsule Take 240 mg by mouth daily. 07/07/20  Yes [provider]  diphenhydrAMINE (BENADRYL) 25 MG tablet Take 25 mg by mouth daily.   Yes [provider]  folic acid (FOLVITE) 1 MG tablet Take 1 mg by mouth daily. 07/10/20  Yes [provider]  furosemide (LASIX) 40 MG tablet Take 20 mg by mouth daily.  06/18/20  Yes [provider]  gabapentin (NEURONTIN) 300 MG capsule Take 600 mg by mouth 2 (two) times daily.  08/12/20  Yes [provider]  HYDROcodone-acetaminophen (NORCO) 10-325 MG tablet Take 1 tablet by mouth every 6 (six) hours as needed (pain).  08/04/20  Yes [provider]  latanoprost (XALATAN) 0.005 % ophthalmic solution Place 1 drop into both eyes at bedtime.  07/27/20  Yes [provider]  lisinopril (ZESTRIL) 40 MG tablet Take 40 mg by mouth at bedtime.  05/25/20  Yes [provider]  metFORMIN (GLUCOPHAGE) 1000 MG tablet Take 1,000 mg by mouth 2 (two) times daily. 07/10/20  Yes [provider]  Multiple Vitamin (MULTIVITAMIN WITH MINERALS) TABS tablet Take 1 tablet by mouth at bedtime.   Yes [provider]  oxyCODONE ER (XTAMPZA ER) 9 MG C12A Take 9 mg by mouth every 12 (twelve) hours.   Yes [provider]  pantoprazole (PROTONIX) 40 MG tablet Take 40 mg by mouth 2 (two) times daily. 05/26/20  Yes [provider]  potassium chloride SA (KLOR-CON) 20 MEQ tablet Take 20 mEq by mouth daily. 07/07/20  Yes [provider]  [place]  into the lungs at bedtime. CPAP   Yes [provider]  rosuvastatin (CRESTOR) 20 MG tablet Take 20 mg by mouth daily.  05/25/20  Yes [provider]  Blood Glucose Monitoring Suppl (FIFTY50 GLUCOSE METER 2.0) w/Device KIT See admin instructions. 05/16/19   [provider]  Inpatient Medications:  Scheduled Meds:  . aspirin EC 81 mg Oral Daily  . chlorhexidine 15 mL Mouth Rinse BID  . cholecalciferol 1,000 Units Oral QHS  . cloNIDine 0.1 mg Oral BID  . diltiazem 240 mg Oral Daily  . folic acid 1 mg Oral Daily  . furosemide 40 mg Intravenous BID  . gabapentin 600 mg Oral BID  . heparin 5,000 Units Subcutaneous Q8H  . insulin aspart 0-9 Units Subcutaneous TID WC  . latanoprost 1 drop Both Eyes QHS  . lisinopril 40 mg Oral QHS  . mouth rinse 15 mL Mouth Rinse q12n4p  . metoprolol tartrate 25 mg Oral BID  . multivitamin with minerals 1 tablet Oral QHS  . oxyCODONE 10 mg Oral Q12H  . pantoprazole 40 mg Oral BID  . polyethylene glycol 17 g Oral Daily  . rosuvastatin 20 mg Oral Daily  . sodium chloride flush 3 mL Intravenous Q12H   Continuous Infusions:  . sodium chloride 140 mL/hr at 08/18/20 0918  . sodium chloride    PRN Meds:  sodium chloride, sodium chloride, acetaminophen, HYDROcodone-acetaminophen, nitroGLYCERIN, ondansetron (ZOFRAN) IV, oxyCODONE, sodium chloride flush  Allergies:       Allergies  Allergen Reactions  . Cocoa Hives  . Sulfamethoxazole-Trimethoprim Anaphylaxis  . Chocolate Hives  . Citrus Hives    Reaction to oranges and orange juice  . Fish Allergy Hives and Swelling    Throat swelling  . Shellfish Allergy Hives and Swelling    Throat swelling  . Sulfa Antibiotics Hives and Swelling    Throat swelling  . Tetracycline Swelling   Social History:  Social History        Socioeconomic History  . Marital status: Single    Spouse name: Not on file  . Number of children: Not on file  . Years of education: Not on file  . Highest  education level: Not on file  Occupational History  . Not on file  Tobacco Use  . Smoking status: Never Smoker  . Smokeless tobacco: Never Used  Substance and Sexual Activity  . Alcohol use: No  . Drug use: Not Currently  . Sexual  activity: Not on file  Other Topics Concern  . Not on file  Social History Narrative  . Not on file   Social Determinants of Health      Financial Resource Strain:   . Difficulty of Paying Living Expenses: Not on file  Food Insecurity:   . Worried About Charity fundraiser in the Last Year: Not on file  . Ran Out of Food in the Last Year: Not on file  Transportation Needs:   . Lack of Transportation (Medical): Not on file  . Lack of Transportation (Non-Medical): Not on file  Physical Activity:   . Days of Exercise per Week: Not on file  . Minutes of Exercise per Session: Not on file  Stress:   . Feeling of Stress : Not on file  Social Connections:   . Frequency of Communication with Friends and Family: Not on file  . Frequency of Social Gatherings with Friends and Family: Not on file  . Attends Religious Services: Not on file  . Active Member of Clubs or Organizations: Not on file  . Attends Archivist Meetings: Not on file  . Marital Status: Not on file  Intimate Partner Violence:   . Fear of Current or Ex-Partner: Not on file  . Emotionally Abused: Not on file  . Physically Abused: Not on file  . Sexually Abused: Not on file   Family History:  No family history on file.  ROS:  Please see the history of present illness.  All other ROS reviewed and negative.  Physical Exam/Data:         Vitals:   08/18/20 1445 08/18/20 1955 08/19/20 0047 08/19/20 0419  BP: 128/69 127/73 123/70 111/66  Pulse: 78 94 81 81  Resp:  _0 Temp:  98.8 F (37.1 C)  98.4 F (36.9 C)  TempSrc:  Oral    SpO2:  97% 96% 99%  Weight:    (!) 140.4 kg  Height:        Intake/Output Summary (Last 24 hours) at 08/19/2020 1243  Last data filed at  08/19/2020 0700     Gross per 24 hour  Intake 2120.89 ml  Output 3050 ml  Net -929.11 ml   Last 3 Weights 08/19/2020 08/18/2020 08/17/2020  Weight (lbs) 309 lb 8 oz 308 lb 10.3 oz 310 lb 4.8 oz  Weight (kg) 140.388 kg 140 kg 140.751 kg    Body mass index is 43.17 kg/m.  General: Well nourished, well developed, in no acute distress, morbidly obese  HEENT: normal  Lymph: no adenopathy  Neck: Difficult to assess JVD due to body habitus  Cardiac: normal S1, S2; RRR; 3 out of 6 harsh systolic ejection murmur at RUSB. Murmur heard at apex as well.  Lungs: clear to auscultation bilaterally, no wheezing, rhonchi or rales  Abd: soft, nontender, no hepatomegaly  Ext: Trace bilateral lower extremity edema  Musculoskeletal: No deformities, BUE and BLE strength normal and equal  Skin: warm and dry  Neuro: CNs 2-12 intact, no focal abnormalities noted  Psych: Normal affect    EKG: The EKG was personally reviewed and demonstrates: Sinus with retrograde P waves and PACs. Low voltage QRS. Heart rate 93  Telemetry: Telemetry was personally reviewed and demonstrates: Mild sinus tach with PACs. Heart rates in low 100s    Relevant CV Studies:   Echo 08/17/20  IMPRESSIONS 1. Left ventricular ejection fraction, by estimation, is 60 to 65%. The  left ventricle has normal function. The  left ventricle has no regional  wall motion abnormalities. There is moderate concentric left ventricular  hypertrophy. Left ventricular  diastolic parameters are indeterminate.  2. Right ventricular systolic function is normal. The right ventricular  size is normal.  3. Left atrial size was severely dilated.  4. The mitral valve is normal in structure. Trivial mitral valve  regurgitation. The posterior leaflet appears to have limited mobility with  mild flow acceleration noted across the mitral valve with mean gradient  46mHg at HR 101. There is mild narrowing  of the mitral valve orfice without significant stenosis.   5. The aortic valve is tricuspid. There is severe calcifcation of the  aortic valve. There is severe thickening of the aortic valve. Aortic valve  regurgitation is not visualized. Severe aortic valve stenosis with AVA 0.9  (by continuity), mean gradient  560mg, peak gradient 8247m, Vmax 4.65cm2, DOI 0.2.  6. The inferior vena cava is dilated in size with >50% respiratory  variability, suggesting right atrial pressure of 8 mmHg.   FINDINGS  Left Ventricle: Left ventricular ejection fraction, by estimation, is 60  to 65%. The left ventricle has normal function. The left ventricle has no  regional wall motion abnormalities. The left ventricular internal cavity  size was normal in size. There is  moderate concentric left ventricular hypertrophy. Left ventricular  diastolic parameters are indeterminate.   Right Ventricle: The right ventricular size is normal. Right vetricular  wall thickness was not well visualized. Right ventricular systolic  function is normal.   Left Atrium: Left atrial size was severely dilated.   Right Atrium: Right atrial size was normal in size.   Pericardium: There is no evidence of pericardial effusion.   Mitral Valve: The mitral valve is normal in structure. There is mild  thickening of the mitral valve leaflet(s). Trivial mitral valve  regurgitation. The posterior leaflet appears to have limited mobility with  mild flow acceleration noted across the mitral  valve with mean gradient 6mm105mat HR 101. There is mild narrowing fo the  mitral valve orfice without significant stenosis. MV peak gradient, 10.1  mmHg. The mean mitral valve gradient is 6.0 mmHg.   Tricuspid Valve: The tricuspid valve is normal in structure. Tricuspid  valve regurgitation is trivial.   Aortic Valve: The aortic valve is tricuspid. There is severe calcifcation  of the aortic valve. There is severe thickening of the aortic valve.  Aortic valve regurgitation is not visualized. Severe  aortic stenosis is  present. Aortic valve mean gradient  measures 51.0 mmHg. Aortic valve peak gradient measures 83.2 mmHg. Aortic  valve area, by VTI measures 0.89 cm. DOI 0.2.   Pulmonic Valve: The pulmonic valve was normal in structure. Pulmonic valve  regurgitation is trivial.   Aorta: The aortic root and ascending aorta are structurally normal, with  no evidence of dilitation.   Venous: The inferior vena cava is dilated in size with greater than 50%  respiratory variability, suggesting right atrial pressure of 8 mmHg.   IAS/Shunts: No atrial level shunt detected by color flow Doppler.    LEFT VENTRICLE  PLAX 2D  LVIDd: 4.29 cm Diastology  LVIDs: 2.46 cm LV e' medial: 8.05 cm/s  LV PW: 1.61 cm LV e' lateral: 10.40 cm/s  LV IVS: 1.24 cm  LVOT diam: 2.40 cm  LV SV: 90  LV SV Index: 36  LVOT Area: 4.52 cm    RIGHT VENTRICLE  RV S prime: 10.30 cm/s  TAPSE (M-mode): 2.0 cm   LEFT  ATRIUM Index RIGHT ATRIUM Index  LA diam: 3.70 cm 1.46 cm/m RA Area: 23.50 cm  LA Vol (A2C): 108.0 ml 42.50 ml/m RA Volume: 75.60 ml 29.75 ml/m  LA Vol (A4C): 123.0 ml 48.40 ml/m  LA Biplane Vol: 120.0 ml 47.22 ml/m  AORTIC VALVE  AV Area (Vmax): 0.97 cm  AV Area (Vmean): 1.18 cm  AV Area (VTI): 0.89 cm  AV Vmax: 456.00 cm/s  AV Vmean: 268.000 cm/s  AV VTI: 1.017 m  AV Peak Grad: 83.2 mmHg  AV Mean Grad: 51.0 mmHg  LVOT Vmax: 97.50 cm/s  LVOT Vmean: 70.000 cm/s  LVOT VTI: 0.200 m  LVOT/AV VTI ratio: 0.20   AORTA  Ao Root diam: 3.20 cm  Ao Asc diam: 3.40 cm   MITRAL VALVE  MV Peak grad: 10.1 mmHg SHUNTS  MV Mean grad: 6.0 mmHg Systemic VTI: 0.20 m  MV Vmax: 1.59 m/s Systemic Diam: 2.40 cm  MV Vmean: 117.0 cm/s   Gwyndolyn Kaufman MD  Electronically signed by Gwyndolyn Kaufman MD  Signature Date/Time: 08/17/2020/12:41:56 PM  _____________________  [facility] 08/18/20  Procedures  RIGHT/LEFT HEART CATH AND CORONARY ANGIOGRAPHY  Conclusion  Widely patent coronary arteries   High cardiac output: 9.06 L/min by Fick and 9.23 L/min by thermal dilution. Etiology uncertain. Suppressed TSH with normal T4 raising question of T3 thyrotoxicosis versus other mechanism.  Severe pulmonary hypertension, mean pressure 48 mmHg, based upon hemodynamics WHO group 2  Pulmonary capillary wedge pressure mean 34 mmHg, pulmonary vascular resistance 1.55 Woods units.  Mean right atrial pressure 19 mmHg  Aortic valve gradient peak to peak 33 mmHg with mean gradient 22 mmHg.  Aortic valve area 1.94 cm by Fick cardiac output and 1.98 cm by thermodilution  LV function normal by echocardiography with moderate LVH RECOMMENDATIONS:  Further work-up/management per treating team.  Needs further diuresis.  T3 level ____________________   TEE 08/21/20  IMPRESSIONS  1. Severe AS (mean gradient 42 mmHg and peak velocity of 4.2 m/s); mild  AI.  2. Left ventricular ejection fraction, by estimation, is 70 to 75%. The  left ventricle has hyperdynamic function. There is severe left ventricular  hypertrophy.  3. Right ventricular systolic function is normal. The right ventricular  size is normal.  4. Left atrial size was moderately dilated. No left atrial/left atrial  appendage thrombus was detected.  5. The mitral valve is normal in structure. Trivial mitral valve  regurgitation.  6. The aortic valve is tricuspid. Aortic valve regurgitation is mild.  Severe aortic valve stenosis.  7. There is Moderate (Grade III) plaque involving the descending aorta.   FINDINGS  Left Ventricle: Left ventricular ejection fraction, by estimation, is 70  to 75%. The left ventricle has hyperdynamic function. The left ventricular  internal cavity size was normal in size. There is severe left ventricular  hypertrophy.   Right Ventricle: The right ventricular size is normal. Right vetricular  wall thickness was not assessed. Right ventricular systolic function is  normal.   Left Atrium: Left atrial size was  moderately dilated. No left atrial/left  atrial appendage thrombus was detected.   Right Atrium: Right atrial size was normal in size.   Pericardium: There is no evidence of pericardial effusion.   Mitral Valve: The mitral valve is normal in structure. Mild mitral annular  calcification. Trivial mitral valve regurgitation. MV peak gradient, 5.8  mmHg. The mean mitral valve gradient is 2.0 mmHg.   Tricuspid Valve: The tricuspid valve is normal in structure. Tricuspid  valve regurgitation  is mild.   Aortic Valve: The aortic valve is tricuspid. Aortic valve regurgitation is  mild. Severe aortic stenosis is present. Aortic valve mean gradient  measures 41.5 mmHg. Aortic valve peak gradient measures 66.7 mmHg. Aortic  valve area, by VTI measures 1.04 cm.   Pulmonic Valve: The pulmonic valve was normal in structure. Pulmonic valve  regurgitation is not visualized.   Aorta: The aortic root is normal in size and structure. There is moderate  (Grade III) plaque involving the descending aorta.   IAS/Shunts: No atrial level shunt detected by color flow Doppler.   Additional Comments: Severe AS (mean gradient 42 mmHg and peak velocity of  4.2 m/s); mild AI.    LEFT VENTRICLE  PLAX 2D  LVOT diam: 2.20 cm  LV SV: 81  LV SV Index: 32  LVOT Area: 3.80 cm    AORTIC VALVE  AV Area (Vmax): 1.01 cm  AV Area (Vmean): 0.92 cm  AV Area (VTI): 1.04 cm  AV Vmax: 408.50 cm/s  AV Vmean: 307.500 cm/s  AV VTI: 0.778 m  AV Peak Grad: 66.7 mmHg  AV Mean Grad: 41.5 mmHg  LVOT Vmax: 108.00 cm/s  LVOT Vmean: 74.800 cm/s  LVOT VTI: 0.214 m  LVOT/AV VTI ratio: 0.27   MITRAL VALVE  MV Peak grad: 5.8 mmHg SHUNTS  MV Mean grad: 2.0 mmHg Systemic VTI: 0.21 m  MV Vmax: 1.20 m/s Systemic Diam: 2.20 cm  MV Vmean: 60.7 cm/s   Kirk Ruths MD  Electronically signed by Kirk Ruths MD  Signature Date/Time: 08/21/2020/9:21:38 AM  Final  ____________________  Cardiac CT 08/19/20  Addenda   ADDENDUM REPORT: 08/20/2020 08:01  EXAM:  OVER-READ INTERPRETATION CT CHEST  The following report is an over-read performed by radiologist Dr.  Rebekah [facility]field [facility] on 08/20/2020. This  over-read does not include interpretation of cardiac or coronary  anatomy or pathology. The coronary calcium score and cardiac CTA  interpretation by the cardiologist is attached.  COMPARISON: Chest CTA 08/15/2020.  FINDINGS:  Extracardiac findings will be described separately under dictation  for contemporaneously obtained CTA chest, abdomen and pelvis.  IMPRESSION:  Please see separate dictation for contemporaneously obtained CTA  chest, abdomen and pelvis 08/19/2020 for full description of  relevant extracardiac findings.  Electronically Signed  By: Vinnie Langton M.D.  On: 08/20/2020 08:01   Signed by Etheleen Mayhew, MD on 08/20/2020 8:04 AM  Narrative & Impression  MEDICATIONS:  MEDICATIONS  None  EXAM:  Cardiac TAVR CT  TECHNIQUE:  The patient was scanned on a Siemens Force 270 slice scanner. A 120  kV retrospective scan was triggered in the descending thoracic aorta  at 111 HU's. Gantry rotation speed was 270 msecs and collimation was  .9 mm. No beta blockade or nitro were given. The 3D data set was  reconstructed in 5% intervals of the R-R cycle. Systolic and  diastolic phases were analyzed on a dedicated work station using  MPR, MIP and VRT modes. The patient received 80 cc of contrast.  FINDINGS:  Aortic Valve: Calcium score 1645 Tri leaflet with restricted motion  Aorta: No aneurysm moderate calcific atherosclerosis normal arch  vessels  Sino-tubular Junction: 25 mm  Ascending Thoracic Aorta: 33 mm  Aortic Arch: 25 mm  Descending Thoracic Aorta: 25 mm  Sinus of Valsalva Measurements:  Non-coronary: 30.8 mm  Right - coronary: 28.2 mm  Left - coronary: 31.3 mm  Coronary Artery Height above Annulus:  Left Main: 11.1 mm above annulus  Right  Coronary:  15.3 mm above annulus  Virtual Basal Annulus Measurements:  Maximum/Minimum Diameter: 27.4 mm x 22.8 mm  Perimeter: 82 mm  Area: 505 mm2  Coronary Arteries: Sufficient height above annulus for deployment  Optimum Fluoroscopic Angle for Delivery: LAO 3 Caudal 6 degrees  IMPRESSION:  1. Tri-leaflet AV with calcium score 1645 and annular area of 505  mm2 suitable for a 26 mm Sapien 3 valve  2. Coronary arteries sufficient height above annulus for deployment  3. Optimum angiographic angle for deployment LAO 3 Caudal 6 degrees  4. Some nodular calcification in annulus at base of left coronary  cusp  5. Normal aortic root 3.3 cm  Jenkins Rouge   ____________________  CT chest/abd/pelvis 08/19/20  Final [99]  Study Result  Narrative & Impression  CLINICAL DATA: 64 year old male with history of severe aortic  stenosis. Preprocedural study prior to potential transcatheter  aortic valve replacement (TAVR) procedure.  EXAM:  CT ANGIOGRAPHY CHEST, ABDOMEN AND PELVIS  TECHNIQUE:  Non-contrast CT of the chest was initially obtained.  Multidetector CT imaging through the chest, abdomen and pelvis was  performed using the standard protocol during bolus administration of  intravenous contrast. Multiplanar reconstructed images and MIPs were  obtained and reviewed to evaluate the vascular anatomy.  CONTRAST: 184m OMNIPAQUE IOHEXOL 350 MG/ML SOLN  COMPARISON: None.  FINDINGS:  CTA CHEST FINDINGS  Cardiovascular: Heart size is mildly enlarged. There is no  significant pericardial fluid, thickening or pericardial  calcification. There is aortic atherosclerosis, as well as  atherosclerosis of the great vessels of the mediastinum and the  coronary arteries, including calcified atherosclerotic plaque in the  left anterior descending, left circumflex and right coronary  arteries. Severe thickening calcification of the aortic valve. Mild  calcifications of the mitral annulus.  Mediastinum/Nodes: No  pathologically enlarged mediastinal or hilar  lymph nodes. Esophagus is unremarkable in appearance. No axillary  lymphadenopathy.  Lungs/Pleura: Patchy areas of ground-glass attenuation and mild  interlobular septal thickening noted throughout the lungs  bilaterally, favored to reflect a background of mild interstitial  pulmonary edema. No confluent consolidative airspace disease.  Moderate right and small left pleural effusions lying dependently  with some associated passive subsegmental atelectasis in the lower  lobes of the lungs bilaterally. Mild scarring in the medial aspect  of the right upper lobe and inferior segment of the lingula.  Musculoskeletal: There are no aggressive appearing lytic or blastic  lesions noted in the visualized portions of the skeleton.  CTA ABDOMEN AND PELVIS FINDINGS  Hepatobiliary: Diffuse low attenuation throughout the hepatic  parenchyma, indicative of a background of hepatic steatosis. In the  inferior aspect of segment 5 of the liver (axial image 134 of series  4) there is a small hypovascular lesion measuring 1.3 cm in diameter  (44 HU), which is indeterminate. No other larger more suspicious  appearing hepatic lesions. No intra or extrahepatic biliary ductal  dilatation. Gallbladder is normal in appearance.  Pancreas: No pancreatic mass. No pancreatic ductal dilatation. No  pancreatic or peripancreatic fluid collections or inflammatory  changes.  Spleen: Unremarkable.  Adrenals/Urinary Tract: Subcentimeter low-attenuation lesions in the  right kidney, too small to characterize, but statistically likely to  represent cysts. Left kidney and bilateral adrenal glands are normal  in appearance. No hydroureteronephrosis. Urinary bladder is normal  in appearance.  Stomach/Bowel: The appearance of the stomach is normal. No  pathologic dilatation of small bowel or colon. Normal appendix.  Vascular/Lymphatic: Aortic atherosclerosis, with vascular findings  and measurements pertinent to potential TAVR procedure, as detailed  below. No aneurysm or dissection noted in the abdominal or pelvic  vasculature. No lymphadenopathy noted in the abdomen or pelvis.  Reproductive: Prostate gland and seminal vesicles are unremarkable  in appearance.  Other: Moderate-sized umbilical hernia containing only omental fat.  Left inguinal hernia containing predominantly fat. No significant  volume of ascites. No pneumoperitoneum.  Musculoskeletal: There are no aggressive appearing lytic or blastic  lesions noted in the visualized portions of the skeleton.  VASCULAR MEASUREMENTS PERTINENT TO TAVR:  AORTA:  Minimal Aortic Diameter-13 x 12 mm  Severity of Aortic Calcification-moderate to severe  RIGHT PELVIS:  Right Common Iliac Artery -  Minimal Diameter-7.3 x 7.8 mm  Tortuosity - mild  Calcification-moderate  Right External Iliac Artery -  Minimal Diameter-8.1 x 4.2 mm  Tortuosity - mild  Calcification-mild  Right Common Femoral Artery -  Minimal Diameter-7.9 x 7.6 mm  Tortuosity - mild  Calcification-mild  LEFT PELVIS:  Left Common Iliac Artery -  Minimal Diameter-7.4 x 7.8 mm  Tortuosity - mild  Calcification-moderate  Left External Iliac Artery -  Minimal Diameter-7.7 x 5.8 mm  Tortuosity - mild  Calcification-none  Left Common Femoral Artery -  Minimal Diameter-8.2 x 8.2 mm  Tortuosity-mild  Calcification-minimal  Review of the MIP images confirms the above findings.  IMPRESSION:  1. Vascular findings and measurements pertinent to potential TAVR  procedure, as detailed above.  2. Severe thickening calcification of the aortic valve, compatible  with the reported clinical history of severe aortic stenosis.  3. Aortic atherosclerosis, in addition to three vessel coronary  artery disease. Please note that although the presence of coronary  artery calcium documents the presence of coronary artery disease,  the severity of this disease and  any potential stenosis cannot be  assessed on this non-gated CT examination. Assessment for potential  risk factor modification, dietary therapy or pharmacologic therapy  may be warranted, if clinically indicated.  4. Cardiomegaly with evidence of interstitial pulmonary edema and  bilateral pleural effusions (right greater than left); imaging  findings suggestive of congestive heart failure.  5. Hepatic steatosis.  6. 1.3 cm indeterminate hypovascular lesion in segment 5 of the  liver. Correlation with nonemergent abdominal MRI with and without  IV gadolinium is recommended in the near future to definitively  characterize this lesion and exclude neoplasm.  7. Additional incidental findings, as above.  Electronically Signed  By: Vinnie Langton M.D.  On: 08/20/2020 08:53   Laboratory Data:  High Sensitivity Troponin:  Last Labs                              Chemistry  Last Labs  Last Labs                                      Hematology  Last Labs                                                                                                                       BNP  Last Labs                     DDimer  Last Labs                  Radiology/Studies:  CT Angio Chest PE W and/or Wo Contrast  Result Date: 08/15/2020  CLINICAL DATA: Shortness of breath EXAM: CT ANGIOGRAPHY CHEST WITH CONTRAST TECHNIQUE: Multidetector CT imaging of the chest was performed using the standard protocol during bolus administration of intravenous contrast. Multiplanar CT image reconstructions and MIPs were obtained to evaluate the vascular anatomy. CONTRAST: 196mL OMNIPAQUE IOHEXOL 350 MG/ML SOLN COMPARISON: None. FINDINGS: Cardiovascular: No filling defects in the pulmonary arteries to suggest pulmonary emboli. Aortic atherosclerosis.  Calcifications of the aortic valve. Heart is normal size. Mediastinum/Nodes: Scattered small mediastinal lymph nodes, none pathologically enlarged. Trachea and esophagus are unremarkable. Thyroid unremarkable. Lungs/Pleura: Small bilateral pleural effusions. Compressive atelectasis in the lower lobes. Patchy bilateral ground-glass airspace opacities most notable in the upper lobes concerning for pneumonia. Upper Abdomen: Imaging into the upper abdomen demonstrates no acute findings. Musculoskeletal: Chest wall soft tissues are unremarkable. No acute bony abnormality. Review of the MIP images confirms the above findings. IMPRESSION: No evidence of pulmonary embolus. Small bilateral pleural effusions. Compressive atelectasis in the lower lobes. Patchy bilateral ground-glass opacities in the upper lobes concerning for pneumonia. Aortic Atherosclerosis (ICD10-I70.0). Electronically Signed By: Rolm Baptise M.D. On: 08/15/2020 19:54  CARDIAC CATHETERIZATION  Result Date: 08/18/2020   Widely patent coronary arteries  High cardiac output: 9.06 L/min by Fick and 9.23 L/min by thermal dilution. Etiology uncertain. Suppressed TSH with normal T4 raising question of T3 thyrotoxicosis versus other mechanism.  Severe pulmonary hypertension, mean pressure 48 mmHg, based upon hemodynamics WHO group 2  Pulmonary capillary wedge pressure mean 34 mmHg, pulmonary vascular resistance 1.55 Woods units.  Mean right atrial pressure 19 mmHg  Aortic valve gradient peak to peak 33 mmHg with mean gradient 22 mmHg.  Aortic valve area 1.94 cm by Fick cardiac output and 1.98 cm by thermodilution  LV function normal by echocardiography with moderate LVH RECOMMENDATIONS:  Further work-up/management per treating team.  Needs further diuresis.  T3 level  DG Chest Port 1 View  Result Date: 08/15/2020  CLINICAL DATA: Shortness of breath with exertion for 1 week. Dry cough. O2 sats 87%. EXAM: PORTABLE CHEST 1 VIEW COMPARISON: 06/08/2014  FINDINGS: Heart size is accentuated by portable technique. There are faint opacities at the lung bases bilaterally, consistent with early infiltrates. No pulmonary edema. IMPRESSION: Suspect bibasilar infiltrates.  Electronically Signed By: Nolon Nations M.D. On: 08/15/2020 17:44  ECHOCARDIOGRAM COMPLETE  Result Date: 08/17/2020  ECHOCARDIOGRAM REPORT Patient Name: PRATT BRESS Date of Exam: 08/17/2020 Medical Rec #: [id number] Height: 71.0 in Accession #: [id number] Weight: 310.3 lb Date of Birth: [birth year] BSA: 2.541 m Patient Age: 23 years BP: 138/83 mmHg Patient Gender: M HR: 97 bpm. Exam Location: Inpatient Procedure: 2D Echo, Color Doppler and Cardiac Doppler Indications: NSTEMI History: Patient has prior history of Echocardiogram examinations, most recent 08/10/2018. Risk Factors:Hypertension and Diabetes. Prior performed at [facility]: [place] Referring Phys: [id number] [place] 1. Left ventricular ejection fraction, by estimation, is 60 to 65%. The left ventricle has normal function. The left ventricle has no regional wall motion abnormalities. There is moderate concentric left ventricular hypertrophy. Left ventricular diastolic parameters are indeterminate. 2. Right ventricular systolic function is normal. The right ventricular size is normal. 3. Left atrial size was severely dilated. 4. The mitral valve is normal in structure. Trivial mitral valve regurgitation. The posterior leaflet appears to have limited mobility with mild flow acceleration noted across the mitral valve with mean gradient 22mHg at HR 101. There is mild narrowing of the mitral valve orfice without significant stenosis. 5. The aortic valve is tricuspid. There is severe calcifcation of the aortic valve. There is severe thickening of the aortic valve. Aortic valve regurgitation is not visualized. Severe aortic valve stenosis with AVA 0.9 (by continuity), mean gradient 568mg, peak gradient 8260m,  Vmax 4.65cm2, DOI 0.2. 6. The inferior vena cava is dilated in size with >50% respiratory variability, suggesting right atrial pressure of 8 mmHg. FINDINGS Left Ventricle: Left ventricular ejection fraction, by estimation, is 60 to 65%. The left ventricle has normal function. The left ventricle has no regional wall motion abnormalities. The left ventricular internal cavity size was normal in size. There is moderate concentric left ventricular hypertrophy. Left ventricular diastolic parameters are indeterminate. Right Ventricle: The right ventricular size is normal. Right vetricular wall thickness was not well visualized. Right ventricular systolic function is normal. Left Atrium: Left atrial size was severely dilated. Right Atrium: Right atrial size was normal in size. Pericardium: There is no evidence of pericardial effusion. Mitral Valve: The mitral valve is normal in structure. There is mild thickening of the mitral valve leaflet(s). Trivial mitral valve regurgitation. The posterior leaflet appears to have limited mobility with mild flow acceleration noted across the mitral valve with mean gradient 6mm28mat HR 101. There is mild narrowing fo the mitral valve orfice without significant stenosis. MV peak gradient, 10.1 mmHg. The mean mitral valve gradient is 6.0 mmHg. Tricuspid Valve: The tricuspid valve is normal in structure. Tricuspid valve regurgitation is trivial. Aortic Valve: The aortic valve is tricuspid. There is severe calcifcation of the aortic valve. There is severe thickening of the aortic valve. Aortic valve regurgitation is not visualized. Severe aortic stenosis is present. Aortic valve mean gradient measures 51.0 mmHg. Aortic valve peak gradient measures 83.2 mmHg. Aortic valve area, by VTI measures 0.89 cm. DOI 0.2. Pulmonic Valve: The pulmonic valve was normal in structure. Pulmonic valve regurgitation is trivial. Aorta: The aortic root and ascending aorta are structurally normal, with no  evidence of dilitation. Venous: The inferior vena cava is dilated in size with greater than 50% respiratory variability, suggesting right atrial pressure of 8 mmHg. IAS/Shunts: No atrial level shunt detected by color flow Doppler. LEFT VENTRICLE PLAX 2D LVIDd: 4.29 cm Diastology LVIDs: 2.46 cm LV e' medial: 8.05 cm/s LV PW: 1.61  cm LV e' lateral: 10.40 cm/s LV IVS: 1.24 cm LVOT diam: 2.40 cm LV SV: 90 LV SV Index: 36 LVOT Area: 4.52 cm RIGHT VENTRICLE RV S prime: 10.30 cm/s TAPSE (M-mode): 2.0 cm LEFT ATRIUM Index RIGHT ATRIUM Index LA diam: 3.70 cm 1.46 cm/m RA Area: 23.50 cm LA Vol (A2C): 108.0 ml 42.50 ml/m RA Volume: 75.60 ml 29.75 ml/m LA Vol (A4C): 123.0 ml 48.40 ml/m LA Biplane Vol: 120.0 ml 47.22 ml/m AORTIC VALVE AV Area (Vmax): 0.97 cm AV Area (Vmean): 1.18 cm AV Area (VTI): 0.89 cm AV Vmax: 456.00 cm/s AV Vmean: 268.000 cm/s AV VTI: 1.017 m AV Peak Grad: 83.2 mmHg AV Mean Grad: 51.0 mmHg LVOT Vmax: 97.50 cm/s LVOT Vmean: 70.000 cm/s LVOT VTI: 0.200 m LVOT/AV VTI ratio: 0.20 AORTA Ao Root diam: 3.20 cm Ao Asc diam: 3.40 cm MITRAL VALVE MV Peak grad: 10.1 mmHg SHUNTS MV Mean grad: 6.0 mmHg Systemic VTI: 0.20 m MV Vmax: 1.59 m/s Systemic Diam: 2.40 cm MV Vmean: 117.0 cm/s Gwyndolyn Kaufman MD Electronically signed by Gwyndolyn Kaufman MD Signature Date/Time: 08/17/2020/12:41:56 PM Final  VAS US DOPPLER PRE CABG  Result Date: 08/19/2020  PREOPERATIVE VASCULAR EVALUATION Indications: Pre-CABG. Risk Factors: Hypertension, Diabetes. Performing Technologist: Griffin Basil RCT RDMS Examination Guidelines: A complete evaluation includes B-mode imaging, spectral Doppler, color Doppler, and power Doppler as needed of all accessible portions of each vessel. Bilateral testing is considered an integral part of a complete examination. Limited examinations for reoccurring indications may be performed as noted. Right Carotid Findings: +----------+--------+--------+--------+--------+------------------+  PSV  cm/sEDV cm/sStenosisDescribeComments  +----------+--------+--------+--------+--------+------------------+ CCA Prox 97 20     +----------+--------+--------+--------+--------+------------------+ CCA Distal104 27   intimal thickening +----------+--------+--------+--------+--------+------------------+ ICA Prox 118 36 1-39%  intimal thickening +----------+--------+--------+--------+--------+------------------+ ICA Distal132 39   intimal thickening +----------+--------+--------+--------+--------+------------------+ ECA 90 5     +----------+--------+--------+--------+--------+------------------+ Portions of this table do not appear on this page. +----------+--------+-------+--------+------------+  PSV cm/sEDV cmsDescribeArm Pressure +----------+--------+-------+--------+------------+ Subclavian188 4    +----------+--------+-------+--------+------------+ +---------+--------+--+--------+--+---------+ VertebralPSV cm/s93EDV cm/s29Antegrade +---------+--------+--+--------+--+---------+ Left Carotid Findings: +----------+--------+--------+--------+--------+------------------+  PSV cm/sEDV cm/sStenosisDescribeComments  +----------+--------+--------+--------+--------+------------------+ CCA Prox 112 29     +----------+--------+--------+--------+--------+------------------+ CCA Distal105 23   intimal thickening +----------+--------+--------+--------+--------+------------------+ ICA Prox 127 39   intimal thickening +----------+--------+--------+--------+--------+------------------+ ICA Distal106 41     +----------+--------+--------+--------+--------+------------------+ ECA 102 13     +----------+--------+--------+--------+--------+------------------+ +----------+--------+--------+--------+------------+ SubclavianPSV cm/sEDV cm/sDescribeArm Pressure +----------+--------+--------+--------+------------+  212 7     +----------+--------+--------+--------+------------+ +---------+--------+--+--------+--+---------+ VertebralPSV cm/s82EDV cm/s24Antegrade +---------+--------+--+--------+--+---------+ ABI Findings: +--------+------------------+-----+---------+--------+ Right Rt Pressure (mmHg)IndexWaveform Comment  +--------+------------------+-----+---------+--------+ [id number]  triphasic  +--------+------------------+-----+---------+--------+ +--------+------------------+-----+---------+-------+ Left Lt Pressure (mmHg)IndexWaveform Comment +--------+------------------+-----+---------+-------+ Brachial120  triphasic  +--------+------------------+-----+---------+-------+ Right Doppler Findings: +--------+--------+-----+---------+--------+ Site PressureIndexDoppler Comments +--------+--------+-----+---------+--------+ [id number]  triphasic  +--------+--------+-----+---------+--------+ Radial   triphasic  +--------+--------+-----+---------+--------+ Ulnar   triphasic  +--------+--------+-----+---------+--------+ Left Doppler Findings: +--------+--------+-----+---------+--------+ Site PressureIndexDoppler Comments +--------+--------+-----+---------+--------+ [id number]  triphasic  +--------+--------+-----+---------+--------+ Radial   triphasic  +--------+--------+-----+---------+--------+ Ulnar   triphasic  +--------+--------+-----+---------+--------+ Summary: Right Carotid: Velocities in the right ICA are consistent with a 1-39% stenosis. The ECA appears <50% stenosed. Left Carotid: Velocities in the left ICA are consistent with a 1-39% stenosis. The ECA appears <50% stenosed. Vertebrals: Bilateral vertebral arteries demonstrate antegrade flow. Right Upper Extremity: Doppler waveforms remain within normal limits with right radial compression. Doppler waveforms remain within normal limits with right ulnar compression. Left Upper Extremity: Doppler  waveforms decrease 50% with left radial compression. Doppler waveform obliterate with left ulnar compression. Preliminary  VAS [place] LOWER EXTREMITY VENOUS (DVT)  Result Date: 08/17/2020  Lower Venous DVTStudy Indications: Swelling. Limitations: Body habitus and poor ultrasound/tissue interface. Comparison  Study: No prior study Performing Technologist: Maudry Mayhew MHA, RDMS, RVT, RDCS Examination Guidelines: A complete evaluation includes B-mode imaging, spectral Doppler, color Doppler, and power Doppler as needed of all accessible portions of each vessel. Bilateral testing is considered an integral part of a complete examination. Limited examinations for reoccurring indications may be performed as noted. The reflux portion of the exam is performed with the patient in reverse Trendelenburg. +---------+---------------+---------+-----------+----------+--------------+ RIGHT CompressibilityPhasicitySpontaneityPropertiesThrombus Aging +---------+---------------+---------+-----------+----------+--------------+ CFV Full Yes Yes    +---------+---------------+---------+-----------+----------+--------------+ SFJ Full      +---------+---------------+---------+-----------+----------+--------------+ FV Prox Full      +---------+---------------+---------+-----------+----------+--------------+ FV Mid Full      +---------+---------------+---------+-----------+----------+--------------+ FV DistalFull      +---------+---------------+---------+-----------+----------+--------------+ PFV Full      +---------+---------------+---------+-----------+----------+--------------+ POP Full Yes Yes    +---------+---------------+---------+-----------+----------+--------------+ PTV Full      +---------+---------------+---------+-----------+----------+--------------+ PERO Full      +---------+---------------+---------+-----------+----------+--------------+  +---------+---------------+---------+-----------+----------+--------------+ LEFT CompressibilityPhasicitySpontaneityPropertiesThrombus Aging +---------+---------------+---------+-----------+----------+--------------+ CFV Full Yes Yes    +---------+---------------+---------+-----------+----------+--------------+ SFJ Full      +---------+---------------+---------+-----------+----------+--------------+ FV Prox Full      +---------+---------------+---------+-----------+----------+--------------+ FV Mid Full      +---------+---------------+---------+-----------+----------+--------------+ FV DistalFull      +---------+---------------+---------+-----------+----------+--------------+ PFV Full      +---------+---------------+---------+-----------+----------+--------------+ POP Full Yes Yes    +---------+---------------+---------+-----------+----------+--------------+ PTV Full      +---------+---------------+---------+-----------+----------+--------------+ PERO Full      +---------+---------------+---------+-----------+----------+--------------+ Summary: RIGHT: - There is no evidence of deep vein thrombosis in the lower extremity. - No cystic structure found in the popliteal fossa. LEFT: - There is no evidence of deep vein thrombosis in the lower extremity. - No cystic structure found in the popliteal fossa. *See table(s) above for measurements and observations. Electronically signed by Ruta Hinds MD on 08/17/2020 at 7:11:02 PM. Final   STS Risk Calculator:  Procedure: Isolated AVR  Risk of Mortality:  1.770%  Renal Failure:  6.918%  Permanent Stroke:  0.815%  Prolonged Ventilation:  15.565%  DSW Infection:  0.249%  Reoperation:  3.374%  Morbidity or Mortality:  19.992%  Short Length of Stay:  32.210%  Long Length of Stay:  8.417%  ____________________  [facility] Cardiomyopathy Questionnaire  KCCQ-12 08/19/2020  1 a. Ability to  shower/bathe Slightly limited  1 b. Ability to walk 1 block Extremely limited  1 c. Ability to hurry/jog Other, Did not do  2. Edema feet/ankles/legs 1-2 times a week  3. Limited by fatigue All of the time  4. Limited by dyspnea All of the time  5. Sitting up / on 3+ pillows Never over the past 2 weeks  6. Limited enjoyment of life Extremely limited  7. Rest of life w/ symptoms Not at all satisfied  8 a. Participation in hobbies Severely limited  8 b. Participation in chores Severely limited  8 c. Visiting family/friends Severely limited   Assessment and Plan:     This 64 year old morbidly obese gentleman presents with New York Heart Association class III symptoms of fatigue and shortness of breath with minimal ambulation which he says just started about a week or so prior to admission. 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 I have personally reviewed his 2D echocardiogram, TEE, cardiac catheterization, and CTA images. His TEE today shows a calcified poorly mobile aortic valve with a mean gradient of 42 mmHg consistent with severe aortic stenosis. There is trivial mitral  regurgitation and mild mitral annular calcification. The mean gradient across the mitral valve was 2 mmHg and peak gradient 5.8 mmHg. Left ventricular systolic function is normal. Cardiac catheterization shows no significant coronary disease. There was severe pulmonary hypertension and elevated mean right atrial pressure of 19 mmHg. He says that since admission his symptoms have significantly improved with diuresis. I think aortic valve replacement is indicated in this patient for relief of his symptoms and prevent progressive left ventricular deterioration. I think he would be a high risk patient for open surgical aortic valve replacement due to his morbid obesity,  limited mobility, OSA, stage III chronic kidney disease, and other comorbid risk factors. I think TAVR would be a reasonable alternative for him. His gated cardiac CTA shows anatomy suitable for TAVR using a 26 mm Sapien 3 valve. Given his BSA of 2.6 m and BMI of 42.3 kg/m it may be worthwhile evaluating him for a Medtronic Evolut valve to give him the lowest postoperative mean aortic valve gradient. His abdominal and pelvic CTA shows adequate pelvic vascular anatomy to allow transfemoral insertion.  The patient was counseled at length regarding treatment alternatives for management of severe symptomatic aortic stenosis. The risks and benefits of surgical intervention has been discussed in detail. Long-term prognosis with medical therapy was discussed. Alternative approaches such as conventional surgical aortic valve replacement, transcatheter aortic valve replacement, and palliative medical therapy were compared and contrasted at length. This discussion was placed in the context of the patient's own specific clinical presentation and past medical history. All of his questions have been addressed.  Following the decision to proceed with transcatheter aortic valve replacement, a discussion was held regarding what types of management strategies would be attempted intraoperatively in the event of life-threatening complications, including whether or not the patient would be considered a candidate for the use of cardiopulmonary bypass and/or conversion to open sternotomy for attempted surgical intervention. The patient is aware of the fact that transient use of cardiopulmonary bypass may be necessary. I think he would be a candidate for emergent sternotomy if needed to manage any intraoperative complications. The patient has been advised of a variety of complications that might develop including but not limited to risks of death, stroke, paravalvular leak, aortic dissection or other major vascular complications,  aortic annulus rupture, device embolization, cardiac rupture or perforation, mitral regurgitation, acute myocardial infarction, arrhythmia, heart block or bradycardia requiring permanent pacemaker placement, congestive heart failure, respiratory failure, renal failure, pneumonia, infection, other late complications related to structural valve deterioration or migration, or other complications that might ultimately cause a temporary or permanent loss of functional independence or other long term morbidity. The patient provides full informed consent for the procedure as described and all questions were answered.   Gaye Pollack, MD

## 2020-09-01 ENCOUNTER — Encounter (HOSPITAL_COMMUNITY): Payer: Self-pay | Admitting: Cardiovascular Disease

## 2020-09-01 ENCOUNTER — Encounter (HOSPITAL_COMMUNITY)
Admission: RE | Disposition: A | Discharge status: Home / Self Care | Payer: Medicare HMO | Source: Home / Self Care | Attending: Surgery

## 2020-09-01 ENCOUNTER — Inpatient Hospital Stay (HOSPITAL_COMMUNITY): Payer: Medicare HMO | Admitting: Vascular Surgery

## 2020-09-01 ENCOUNTER — Inpatient Hospital Stay (HOSPITAL_COMMUNITY): Payer: Medicare HMO | Admitting: Anesthesiology

## 2020-09-01 ENCOUNTER — Inpatient Hospital Stay (HOSPITAL_COMMUNITY): Payer: Medicare HMO

## 2020-09-01 ENCOUNTER — Other Ambulatory Visit: Payer: Self-pay

## 2020-09-01 ENCOUNTER — Other Ambulatory Visit (HOSPITAL_COMMUNITY): Payer: Medicare HMO

## 2020-09-01 ENCOUNTER — Inpatient Hospital Stay (HOSPITAL_COMMUNITY)
Admission: RE | Admit: 2020-09-01 | Discharge: 2020-09-02 | DRG: 267 | Disposition: A | Discharge status: Home / Self Care | Payer: Medicare HMO | Attending: Surgery | Admitting: Surgery

## 2020-09-01 ENCOUNTER — Other Ambulatory Visit: Payer: Self-pay | Admitting: Physician Assistant

## 2020-09-01 ENCOUNTER — Inpatient Hospital Stay (HOSPITAL_COMMUNITY)
Admission: RE | Admit: 2020-09-01 | Discharge: 2020-09-01 | Disposition: A | Discharge status: Home / Self Care | Payer: Medicare HMO | Source: Home / Self Care | Attending: Cardiovascular Disease | Admitting: Cardiovascular Disease

## 2020-09-01 DIAGNOSIS — Z6841 Body Mass Index (BMI) 40.0 and over, adult: Secondary | ICD-10-CM | POA: Diagnosis not present

## 2020-09-01 DIAGNOSIS — M170 Bilateral primary osteoarthritis of knee: Secondary | ICD-10-CM | POA: Diagnosis present

## 2020-09-01 DIAGNOSIS — I350 Nonrheumatic aortic (valve) stenosis: Secondary | ICD-10-CM

## 2020-09-01 DIAGNOSIS — Z9989 Dependence on other enabling machines and devices: Secondary | ICD-10-CM

## 2020-09-01 DIAGNOSIS — Z7982 Long term (current) use of aspirin: Secondary | ICD-10-CM

## 2020-09-01 DIAGNOSIS — G4733 Obstructive sleep apnea (adult) (pediatric): Secondary | ICD-10-CM | POA: Diagnosis present

## 2020-09-01 DIAGNOSIS — Z881 Allergy status to other antibiotic agents status: Secondary | ICD-10-CM

## 2020-09-01 DIAGNOSIS — I129 Hypertensive chronic kidney disease with stage 1 through stage 4 chronic kidney disease, or unspecified chronic kidney disease: Secondary | ICD-10-CM | POA: Diagnosis present

## 2020-09-01 DIAGNOSIS — Z952 Presence of prosthetic heart valve: Secondary | ICD-10-CM

## 2020-09-01 DIAGNOSIS — Z79899 Other long term (current) drug therapy: Secondary | ICD-10-CM

## 2020-09-01 DIAGNOSIS — Z7984 Long term (current) use of oral hypoglycemic drugs: Secondary | ICD-10-CM

## 2020-09-01 DIAGNOSIS — N183 Chronic kidney disease, stage 3 unspecified: Secondary | ICD-10-CM | POA: Diagnosis present

## 2020-09-01 DIAGNOSIS — D649 Anemia, unspecified: Secondary | ICD-10-CM | POA: Diagnosis present

## 2020-09-01 DIAGNOSIS — Z882 Allergy status to sulfonamides status: Secondary | ICD-10-CM

## 2020-09-01 DIAGNOSIS — K219 Gastro-esophageal reflux disease without esophagitis: Secondary | ICD-10-CM | POA: Diagnosis present

## 2020-09-01 DIAGNOSIS — I272 Pulmonary hypertension, unspecified: Secondary | ICD-10-CM | POA: Diagnosis present

## 2020-09-01 DIAGNOSIS — I700 Atherosclerosis of aorta: Secondary | ICD-10-CM | POA: Diagnosis present

## 2020-09-01 DIAGNOSIS — E785 Hyperlipidemia, unspecified: Secondary | ICD-10-CM | POA: Diagnosis present

## 2020-09-01 DIAGNOSIS — G4730 Sleep apnea, unspecified: Secondary | ICD-10-CM | POA: Diagnosis present

## 2020-09-01 DIAGNOSIS — N179 Acute kidney failure, unspecified: Secondary | ICD-10-CM | POA: Diagnosis not present

## 2020-09-01 DIAGNOSIS — Z79891 Long term (current) use of opiate analgesic: Secondary | ICD-10-CM

## 2020-09-01 DIAGNOSIS — Z91013 Allergy to seafood: Secondary | ICD-10-CM

## 2020-09-01 DIAGNOSIS — Z006 Encounter for examination for normal comparison and control in clinical research program: Secondary | ICD-10-CM

## 2020-09-01 DIAGNOSIS — Z888 Allergy status to other drugs, medicaments and biological substances status: Secondary | ICD-10-CM

## 2020-09-01 DIAGNOSIS — E1122 Type 2 diabetes mellitus with diabetic chronic kidney disease: Secondary | ICD-10-CM | POA: Diagnosis present

## 2020-09-01 DIAGNOSIS — Z20822 Contact with and (suspected) exposure to covid-19: Secondary | ICD-10-CM | POA: Diagnosis present

## 2020-09-01 DIAGNOSIS — I342 Nonrheumatic mitral (valve) stenosis: Secondary | ICD-10-CM | POA: Diagnosis present

## 2020-09-01 DIAGNOSIS — Z954 Presence of other heart-valve replacement: Secondary | ICD-10-CM | POA: Diagnosis not present

## 2020-09-01 DIAGNOSIS — K769 Liver disease, unspecified: Secondary | ICD-10-CM | POA: Diagnosis present

## 2020-09-01 DIAGNOSIS — I252 Old myocardial infarction: Secondary | ICD-10-CM

## 2020-09-01 DIAGNOSIS — I5033 Acute on chronic diastolic (congestive) heart failure: Secondary | ICD-10-CM | POA: Diagnosis present

## 2020-09-01 DIAGNOSIS — Z87891 Personal history of nicotine dependence: Secondary | ICD-10-CM

## 2020-09-01 DIAGNOSIS — Z91018 Allergy to other foods: Secondary | ICD-10-CM

## 2020-09-01 DIAGNOSIS — D631 Anemia in chronic kidney disease: Secondary | ICD-10-CM | POA: Diagnosis present

## 2020-09-01 DIAGNOSIS — G8929 Other chronic pain: Secondary | ICD-10-CM | POA: Diagnosis present

## 2020-09-01 HISTORY — PX: TEE WITHOUT CARDIOVERSION: SHX5443

## 2020-09-01 HISTORY — DX: Presence of prosthetic heart valve: Z95.2

## 2020-09-01 HISTORY — PX: TRANSCATHETER AORTIC VALVE REPLACEMENT, TRANSFEMORAL: SHX6400

## 2020-09-01 LAB — POCT I-STAT, CHEM 8
BUN: 23 mg/dL (ref 8–23)
BUN: 23 mg/dL (ref 8–23)
BUN: 19 mg/dL (ref 8–23)
BUN: 22 mg/dL (ref 8–23)
BUN: 18 mg/dL (ref 8–23)
Calcium, Ion: 1.22 mmol/L (ref 1.15–1.40)
Calcium, Ion: 1.27 mmol/L (ref 1.15–1.40)
Calcium, Ion: 1.18 mmol/L (ref 1.15–1.40)
Calcium, Ion: 1.28 mmol/L (ref 1.15–1.40)
Calcium, Ion: 1.22 mmol/L (ref 1.15–1.40)
Chloride: 101 mmol/L (ref 98–111)
Chloride: 100 mmol/L (ref 98–111)
Chloride: 106 mmol/L (ref 98–111)
Chloride: 99 mmol/L (ref 98–111)
Chloride: 104 mmol/L (ref 98–111)
Creatinine, Ser: 1.2 mg/dL (ref 0.61–1.24)
Creatinine, Ser: 1.1 mg/dL (ref 0.61–1.24)
Creatinine, Ser: 0.9 mg/dL (ref 0.61–1.24)
Creatinine, Ser: 1.2 mg/dL (ref 0.61–1.24)
Creatinine, Ser: 0.9 mg/dL (ref 0.61–1.24)
Glucose, Bld: 167 mg/dL — ABNORMAL HIGH (ref 70–99)
Glucose, Bld: 199 mg/dL — ABNORMAL HIGH (ref 70–99)
Glucose, Bld: 137 mg/dL — ABNORMAL HIGH (ref 70–99)
Glucose, Bld: 194 mg/dL — ABNORMAL HIGH (ref 70–99)
Glucose, Bld: 144 mg/dL — ABNORMAL HIGH (ref 70–99)
HCT: 33 % — ABNORMAL LOW (ref 39.0–52.0)
HCT: 31 % — ABNORMAL LOW (ref 39.0–52.0)
HCT: 30 % — ABNORMAL LOW (ref 39.0–52.0)
HCT: 32 % — ABNORMAL LOW (ref 39.0–52.0)
HCT: 29 % — ABNORMAL LOW (ref 39.0–52.0)
Hemoglobin: 11.2 g/dL — ABNORMAL LOW (ref 13.0–17.0)
Hemoglobin: 10.5 g/dL — ABNORMAL LOW (ref 13.0–17.0)
Hemoglobin: 10.2 g/dL — ABNORMAL LOW (ref 13.0–17.0)
Hemoglobin: 10.9 g/dL — ABNORMAL LOW (ref 13.0–17.0)
Hemoglobin: 9.9 g/dL — ABNORMAL LOW (ref 13.0–17.0)
Potassium: 4.2 mmol/L (ref 3.5–5.1)
Potassium: 4.4 mmol/L (ref 3.5–5.1)
Potassium: 4 mmol/L (ref 3.5–5.1)
Potassium: 4.1 mmol/L (ref 3.5–5.1)
Potassium: 4 mmol/L (ref 3.5–5.1)
Sodium: 142 mmol/L (ref 135–145)
Sodium: 141 mmol/L (ref 135–145)
Sodium: 141 mmol/L (ref 135–145)
Sodium: 142 mmol/L (ref 135–145)
Sodium: 140 mmol/L (ref 135–145)
TCO2: 28 mmol/L (ref 22–32)
TCO2: 30 mmol/L (ref 22–32)
TCO2: 21 mmol/L — ABNORMAL LOW (ref 22–32)
TCO2: 29 mmol/L (ref 22–32)
TCO2: 21 mmol/L — ABNORMAL LOW (ref 22–32)

## 2020-09-01 LAB — POCT I-STAT 7, (LYTES, BLD GAS, ICA,H+H)
Acid-Base Excess: 4 mmol/L — ABNORMAL HIGH (ref 0.0–2.0)
Acid-base deficit: 2 mmol/L (ref 0.0–2.0)
Bicarbonate: 31.8 mmol/L — ABNORMAL HIGH (ref 20.0–28.0)
Bicarbonate: 22.6 mmol/L (ref 20.0–28.0)
Calcium, Ion: 1.27 mmol/L (ref 1.15–1.40)
Calcium, Ion: 1.19 mmol/L (ref 1.15–1.40)
HCT: 32 % — ABNORMAL LOW (ref 39.0–52.0)
HCT: 32 % — ABNORMAL LOW (ref 39.0–52.0)
Hemoglobin: 10.9 g/dL — ABNORMAL LOW (ref 13.0–17.0)
Hemoglobin: 10.9 g/dL — ABNORMAL LOW (ref 13.0–17.0)
O2 Saturation: 98 %
O2 Saturation: 100 %
Potassium: 4.3 mmol/L (ref 3.5–5.1)
Potassium: 4 mmol/L (ref 3.5–5.1)
Sodium: 142 mmol/L (ref 135–145)
Sodium: 141 mmol/L (ref 135–145)
TCO2: 34 mmol/L — ABNORMAL HIGH (ref 22–32)
TCO2: 24 mmol/L (ref 22–32)
pCO2 arterial: 61.7 mmHg — ABNORMAL HIGH (ref 32.0–48.0)
pCO2 arterial: 35 mmHg (ref 32.0–48.0)
pH, Arterial: 7.321 — ABNORMAL LOW (ref 7.350–7.450)
pH, Arterial: 7.417 (ref 7.350–7.450)
pO2, Arterial: 111 mmHg — ABNORMAL HIGH (ref 83.0–108.0)
pO2, Arterial: 197 mmHg — ABNORMAL HIGH (ref 83.0–108.0)

## 2020-09-01 LAB — ECHOCARDIOGRAM LIMITED
AR max vel: 1.35 cm2
AV Area VTI: 1.22 cm2
AV Area mean vel: 1.12 cm2
AV Mean grad: 39 mmHg
AV Peak grad: 32.9 mmHg
Ao pk vel: 2.87 m/s
S' Lateral: 3.4 cm

## 2020-09-01 LAB — POCT ACTIVATED CLOTTING TIME
Activated Clotting Time: 249 seconds
Activated Clotting Time: 114 seconds

## 2020-09-01 LAB — ABO/RH: ABO/RH(D): A NEG

## 2020-09-01 LAB — GLUCOSE, CAPILLARY: Glucose-Capillary: 161 mg/dL — ABNORMAL HIGH (ref 70–99)

## 2020-09-01 SURGERY — IMPLANTATION, AORTIC VALVE, TRANSCATHETER, FEMORAL APPROACH
Anesthesia: Monitor Anesthesia Care

## 2020-09-01 MED ORDER — ACETAMINOPHEN 325 MG PO TABS: 650.0000 mg | ORAL_TABLET | Freq: Four times a day (QID) | ORAL | Status: DC | PRN

## 2020-09-01 MED ORDER — VANCOMYCIN HCL IN DEXTROSE 1-5 GM/200ML-% IV SOLN
1000.0000 mg | Freq: Once | INTRAVENOUS | Status: AC
  Administered 2020-09-01: 1000 mg via INTRAVENOUS
  Filled 2020-09-01: qty 200

## 2020-09-01 MED ORDER — PHENYLEPHRINE HCL-NACL 20-0.9 MG/250ML-% IV SOLN: 0.0000 ug/min | INTRAVENOUS | Status: DC

## 2020-09-01 MED ORDER — CHLORHEXIDINE GLUCONATE 4 % EX LIQD
30.0000 mL | CUTANEOUS | Status: DC
  Filled 2020-09-01: qty 30

## 2020-09-01 MED ORDER — TRAMADOL HCL 50 MG PO TABS: 50.0000 mg | ORAL_TABLET | ORAL | Status: DC | PRN

## 2020-09-01 MED ORDER — IOHEXOL 350 MG/ML SOLN
INTRAVENOUS | Status: AC
  Filled 2020-09-01: qty 1

## 2020-09-01 MED ORDER — SODIUM CHLORIDE 0.9 % IV SOLN: 250.0000 mL | INTRAVENOUS | Status: DC | PRN

## 2020-09-01 MED ORDER — DEXMEDETOMIDINE HCL IN NACL 400 MCG/100ML IV SOLN
INTRAVENOUS | Status: DC | PRN
  Administered 2020-09-01: 80 ug via INTRAVENOUS

## 2020-09-01 MED ORDER — CHLORHEXIDINE GLUCONATE 4 % EX LIQD
60.0000 mL | Freq: Once | CUTANEOUS | Status: DC
  Filled 2020-09-01: qty 60

## 2020-09-01 MED ORDER — HEPARIN (PORCINE) IN NACL 1000-0.9 UT/500ML-% IV SOLN
INTRAVENOUS | Status: DC | PRN
  Administered 2020-09-01: 500 mL

## 2020-09-01 MED ORDER — PROPOFOL 500 MG/50ML IV EMUL
INTRAVENOUS | Status: DC | PRN
  Administered 2020-09-01: 5 ug/kg/min via INTRAVENOUS

## 2020-09-01 MED ORDER — CHLORHEXIDINE GLUCONATE 0.12 % MT SOLN
15.0000 mL | Freq: Once | OROMUCOSAL | Status: AC
  Administered 2020-09-01: 15 mL via OROMUCOSAL
  Filled 2020-09-01 (×2): qty 15

## 2020-09-01 MED ORDER — IOHEXOL 350 MG/ML SOLN
INTRAVENOUS | Status: DC | PRN
  Administered 2020-09-01: 20 mL

## 2020-09-01 MED ORDER — SODIUM CHLORIDE 0.9% FLUSH: 3.0000 mL | Freq: Two times a day (BID) | INTRAVENOUS | Status: DC

## 2020-09-01 MED ORDER — ONDANSETRON HCL 4 MG/2ML IJ SOLN
INTRAMUSCULAR | Status: DC | PRN
  Administered 2020-09-01: 4 mg via INTRAVENOUS

## 2020-09-01 MED ORDER — CLOPIDOGREL BISULFATE 75 MG PO TABS
75.0000 mg | ORAL_TABLET | Freq: Every day | ORAL | Status: DC
  Administered 2020-09-02: 75 mg via ORAL
  Filled 2020-09-01: qty 1

## 2020-09-01 MED ORDER — LIDOCAINE HCL (PF) 1 % IJ SOLN
INTRAMUSCULAR | Status: AC
  Filled 2020-09-01: qty 30

## 2020-09-01 MED ORDER — MORPHINE SULFATE (PF) 4 MG/ML IV SOLN: 1.0000 mg | INTRAVENOUS | Status: DC | PRN

## 2020-09-01 MED ORDER — SODIUM CHLORIDE 0.9 % IR SOLN
Status: DC | PRN
  Administered 2020-09-01: 1000 mL

## 2020-09-01 MED ORDER — LACTATED RINGERS IV SOLN: INTRAVENOUS | Status: DC | PRN

## 2020-09-01 MED ORDER — OXYCODONE HCL 5 MG PO TABS
5.0000 mg | ORAL_TABLET | ORAL | Status: DC | PRN
  Administered 2020-09-01 – 2020-09-02 (×3): 10 mg via ORAL
  Filled 2020-09-01 (×3): qty 2

## 2020-09-01 MED ORDER — HEPARIN SODIUM (PORCINE) 1000 UNIT/ML IJ SOLN
INTRAMUSCULAR | Status: DC | PRN
  Administered 2020-09-01: 1000 [IU] via INTRAVENOUS
  Administered 2020-09-01: 22000 [IU] via INTRAVENOUS

## 2020-09-01 MED ORDER — SODIUM CHLORIDE 0.9 % IV SOLN: INTRAVENOUS | Status: DC

## 2020-09-01 MED ORDER — LACTATED RINGERS IV SOLN: INTRAVENOUS | Status: DC

## 2020-09-01 MED ORDER — NITROGLYCERIN IN D5W 200-5 MCG/ML-% IV SOLN: 0.0000 ug/min | INTRAVENOUS | Status: DC

## 2020-09-01 MED ORDER — HEPARIN (PORCINE) IN NACL 1000-0.9 UT/500ML-% IV SOLN
INTRAVENOUS | Status: AC
  Filled 2020-09-01: qty 1000

## 2020-09-01 MED ORDER — ASPIRIN EC 81 MG PO TBEC
81.0000 mg | DELAYED_RELEASE_TABLET | Freq: Every day | ORAL | Status: DC
  Administered 2020-09-01: 81 mg via ORAL
  Filled 2020-09-01: qty 1

## 2020-09-01 MED ORDER — LIDOCAINE HCL (PF) 1 % IJ SOLN
INTRAMUSCULAR | Status: DC | PRN
  Administered 2020-09-01: 10 mL

## 2020-09-01 MED ORDER — SODIUM CHLORIDE 0.9 % IV SOLN: INTRAVENOUS | Status: AC

## 2020-09-01 MED ORDER — ROSUVASTATIN CALCIUM 20 MG PO TABS
20.0000 mg | ORAL_TABLET | Freq: Every day | ORAL | Status: DC
  Administered 2020-09-01 – 2020-09-02 (×2): 20 mg via ORAL
  Filled 2020-09-01 (×2): qty 1

## 2020-09-01 MED ORDER — SODIUM CHLORIDE 0.9 % IV SOLN
1.5000 g | Freq: Two times a day (BID) | INTRAVENOUS | Status: DC
  Administered 2020-09-01 – 2020-09-02 (×2): 1.5 g via INTRAVENOUS
  Filled 2020-09-01 (×3): qty 1.5

## 2020-09-01 MED ORDER — PANTOPRAZOLE SODIUM 40 MG PO TBEC
40.0000 mg | DELAYED_RELEASE_TABLET | Freq: Two times a day (BID) | ORAL | Status: DC
  Administered 2020-09-01 – 2020-09-02 (×3): 40 mg via ORAL
  Filled 2020-09-01 (×3): qty 1

## 2020-09-01 MED ORDER — PROTAMINE SULFATE 10 MG/ML IV SOLN
INTRAVENOUS | Status: DC | PRN
  Administered 2020-09-01: 20 mg via INTRAVENOUS
  Administered 2020-09-01: 200 mg via INTRAVENOUS

## 2020-09-01 MED ORDER — ONDANSETRON HCL 4 MG/2ML IJ SOLN: 4.0000 mg | Freq: Four times a day (QID) | INTRAMUSCULAR | Status: DC | PRN

## 2020-09-01 MED ORDER — ACETAMINOPHEN 650 MG RE SUPP: 650.0000 mg | Freq: Four times a day (QID) | RECTAL | Status: DC | PRN

## 2020-09-01 MED ORDER — MIDAZOLAM HCL 2 MG/2ML IJ SOLN
INTRAMUSCULAR | Status: DC | PRN
  Administered 2020-09-01: 2 mg via INTRAVENOUS

## 2020-09-01 MED ORDER — GABAPENTIN 300 MG PO CAPS
600.0000 mg | ORAL_CAPSULE | Freq: Two times a day (BID) | ORAL | Status: DC
  Administered 2020-09-01 – 2020-09-02 (×3): 600 mg via ORAL
  Filled 2020-09-01 (×3): qty 2

## 2020-09-01 MED ORDER — SODIUM CHLORIDE 0.9% FLUSH: 3.0000 mL | INTRAVENOUS | Status: DC | PRN

## 2020-09-01 SURGICAL SUPPLY — 38 items
BAG SNAP BAND KOVER 36X36 (MISCELLANEOUS) ×5 IMPLANT
BALLN TRUE 20X4.5 (BALLOONS) ×2
BALLOON TRUE 20X4.5 (BALLOONS) IMPLANT
BLANKET WARM UNDERBOD FULL ACC (MISCELLANEOUS) ×2 IMPLANT
CABLE ADAPT PACING TEMP 12FT (ADAPTER) ×1 IMPLANT
CATH DIAG 6FR PIGTAIL ANGLED (CATHETERS) ×1 IMPLANT
CATH INFINITI 5 FR STR PIGTAIL (CATHETERS) ×1 IMPLANT
CATH INFINITI 6F AL2 (CATHETERS) ×1 IMPLANT
CATH S G BIP PACING (CATHETERS) ×1 IMPLANT
CLOSURE MYNX CONTROL 6F/7F (Vascular Products) ×1 IMPLANT
CLOSURE PERCLOSE PROSTYLE (VASCULAR PRODUCTS) ×2 IMPLANT
DRYSEAL FLEXSHEATH 18FR 33CM (SHEATH) ×1
GUIDEWIRE CNFDA BRKR CVD (WIRE) ×1 IMPLANT
KIT HEART LEFT (KITS) ×2 IMPLANT
KIT MICROPUNCTURE NIT STIFF (SHEATH) ×1 IMPLANT
PACK CARDIAC CATHETERIZATION (CUSTOM PROCEDURE TRAY) ×2 IMPLANT
SHEATH BRITE TIP 7FR 35CM (SHEATH) ×1 IMPLANT
SHEATH DRYSEAL FLEX 18FR 33CM (SHEATH) IMPLANT
SHEATH PINNACLE 6F 10CM (SHEATH) ×1 IMPLANT
SHEATH PINNACLE 8F 10CM (SHEATH) ×1 IMPLANT
SHEATH PROBE COVER 6X72 (BAG) ×1 IMPLANT
SLEEVE REPOSITIONING LENGTH 30 (MISCELLANEOUS) ×1 IMPLANT
STOPCOCK MORSE 400PSI 3WAY (MISCELLANEOUS) ×3 IMPLANT
SYS DEL EVOLUT PROPLS 23 26 29 (CATHETERS) ×2
SYS LOAD EVOLT PROPLS 23 26 29 (CATHETERS) ×2
SYSTEM DEL EVLT PRPLS 23 26 29 (CATHETERS) IMPLANT
SYSTEM LOAD EVLT PRPLS23 26 29 (CATHETERS) IMPLANT
TRANSDUCER W/STOPCOCK (MISCELLANEOUS) ×4 IMPLANT
TUBE CONN 8.8X1320 FR HP M-F (CONNECTOR) ×1 IMPLANT
TUBING ART PRESS 72  MALE/FEM (TUBING) ×1
TUBING ART PRESS 72 MALE/FEM (TUBING) IMPLANT
TUBING CIL FLEX 10 FLL-RA (TUBING) ×1 IMPLANT
VALVE AORTIC EVOLUT PROPLUS 29 (Valve) ×1 IMPLANT
WIRE AMPLATZ SS-J .035X180CM (WIRE) ×1 IMPLANT
WIRE EMERALD 3MM-J .035X150CM (WIRE) ×1 IMPLANT
WIRE EMERALD 3MM-J .035X260CM (WIRE) ×1 IMPLANT
WIRE EMERALD ST .035X260CM (WIRE) ×2 IMPLANT
WIRE MICROINTRODUCER 60CM (WIRE) ×1 IMPLANT

## 2020-09-01 NOTE — Anesthesia Procedure Notes (Signed)
Arterial Line Insertion Start/End11/16/2021 8:35 AM Performed by: Drema Pry, CRNA, CRNA  Patient location: Pre-op. Preanesthetic checklist: patient identified, IV checked, risks and benefits discussed, surgical consent, monitors and equipment checked and pre-op evaluation Lidocaine 1% used for infiltration Left, radial was placed Catheter size: 20 G Hand hygiene performed  and maximum sterile barriers used   Attempts: 1 Procedure performed without using ultrasound guided technique. Following insertion, dressing applied and Biopatch. Post procedure assessment: normal  Patient tolerated the procedure well with no immediate complications.

## 2020-09-01 NOTE — Interval H&P Note (Signed)
History and Physical Interval Note:  09/01/2020 9:32 AM  [name]  has presented today for surgery, with the diagnosis of Severe Aortic Stenosis.  The various methods of treatment have been discussed with the patient and family. After consideration of risks, benefits and other options for treatment, the patient has consented to  Procedure(s): TRANSCATHETER AORTIC VALVE REPLACEMENT, TRANSFEMORAL (N/A) TRANSESOPHAGEAL ECHOCARDIOGRAM (TEE) (N/A) as a surgical intervention.  The patient's history has been reviewed, patient examined, no change in status, stable for surgery.  I have reviewed the patient's chart and labs.  Questions were answered to the patient's satisfaction.     Alleen Borne

## 2020-09-01 NOTE — Anesthesia Postprocedure Evaluation (Signed)
Anesthesia Post Note  Patient: Lawrence Rose  Procedure(s) Performed: TRANSCATHETER AORTIC VALVE REPLACEMENT, TRANSFEMORAL (N/A ) TRANSESOPHAGEAL ECHOCARDIOGRAM (TEE) (N/A )     Patient location during evaluation: PACU Anesthesia Type: MAC Level of consciousness: awake and alert Pain management: pain level controlled Vital Signs Assessment: post-procedure vital signs reviewed and stable Respiratory status: spontaneous breathing, nonlabored ventilation, respiratory function stable and patient connected to nasal cannula oxygen Cardiovascular status: stable and blood pressure returned to baseline Postop Assessment: no apparent nausea or vomiting Anesthetic complications: no   No complications documented.  Last Vitals:  Vitals:   09/01/20 1400 09/01/20 1425  BP: 100/61 (!) 123/57  Pulse: 80 77  Resp: 16 16  Temp:    SpO2: 97% 96%    Last Pain:  Vitals:   09/01/20 1332  TempSrc: Oral  PainSc:                  Tiajuana Amass

## 2020-09-01 NOTE — Progress Notes (Signed)
Patient brought to 4E from cath lab. Vitals stable. Telemetry applied. CCMD notified. CHG bath complete. Bilateral groin sites level 0. Palpable pedal pulses bilateral. Patient oriented to staff and room. Patient educated on bedrest until 1530.   Kenard Gower, RN

## 2020-09-01 NOTE — CV Procedure (Signed)
[facility]  TAVR OPERATIVE NOTE   Date of Procedure:  09/01/2020  Preoperative Diagnosis: Severe Aortic Stenosis   Postoperative Diagnosis: Same   Procedure:    Transcatheter Aortic Valve Replacement - Transfemoral Approach  Medtronic Evolut Pro THV (size 29 mm, model # [id number], serial # [id number])   Co-Surgeons:  Lauree Chandler, MD and Gaye Pollack, MD   Anesthesiologist:  Ola Spurr  Echocardiographer:  Meda Coffee  Pre-operative Echo Findings:  Severe aortic stenosis  Normal left ventricular systolic function  Post-operative Echo Findings:  No paravalvular leak  Normal left ventricular systolic function  BRIEF CLINICAL NOTE AND INDICATIONS FOR SURGERY  Lawrence Rose is a 64 yo male with history of HTN, HLD, anemia, morbid obesity, sleep apnea, CKD stage 3, pulm HTN, Mild MS and severe AS here today for TAVR. Echo with LVEF=60% with severe AS, mean gradient 51 mmHg, peak gradient 83 mmHg. Cath with widely patent coronary arteries. Plans for 29 mm Medtronic Evolut Pro valve from transfemoral approach today.   Pt with recent fatigue, progressive dyspnea on exertion.   During the course of the patient's preoperative work up they have been evaluated comprehensively by a multidisciplinary team of specialists coordinated through the [facility] in the [facility].  They have been demonstrated to suffer from symptomatic severe aortic stenosis as noted above. The patient has been counseled extensively as to the relative risks and benefits of all options for the treatment of severe aortic stenosis including long term medical therapy, conventional surgery for aortic valve replacement, and transcatheter aortic valve replacement.  The patient has been independently evaluated by Dr. Cyndia Bent with CT surgery and they are felt to be at high risk for conventional surgical aortic valve replacement. The surgeon  indicated the patient would be a poor candidate for conventional surgery. Based upon review of all of the patient's preoperative diagnostic tests they are felt to be candidate for transcatheter aortic valve replacement using the transfemoral approach as an alternative to high risk conventional surgery.    Following the decision to proceed with transcatheter aortic valve replacement, a discussion has been held regarding what types of management strategies would be attempted intraoperatively in the event of life-threatening complications, including whether or not the patient would be considered a candidate for the use of cardiopulmonary bypass and/or conversion to open sternotomy for attempted surgical intervention.  The patient has been advised of a variety of complications that might develop peculiar to this approach including but not limited to risks of death, stroke, paravalvular leak, aortic dissection or other major vascular complications, aortic annulus rupture, device embolization, cardiac rupture or perforation, acute myocardial infarction, arrhythmia, heart block or bradycardia requiring permanent pacemaker placement, congestive heart failure, respiratory failure, renal failure, pneumonia, infection, other late complications related to structural valve deterioration or migration, or other complications that might ultimately cause a temporary or permanent loss of functional independence or other long term morbidity.  The patient provides full informed consent for the procedure as described and all questions were answered preoperatively.    DETAILS OF THE OPERATIVE PROCEDURE  PREPARATION:   The patient is brought to the operating room on the above mentioned date and central monitoring was established by the anesthesia team including placement of a radial arterial line. The patient is placed in the supine position on the operating table.  Intravenous antibiotics are administered. Conscious sedation is  used.   Baseline transthoracic echocardiogram was performed. The patient's chest, abdomen, both  groins, and both lower extremities are prepared and draped in a sterile manner. A time out procedure is performed.   PERIPHERAL ACCESS:   Using the modified Seldinger technique, femoral arterial and venous access were obtained with placement of 6 Fr sheaths on the left side using u/s guidance.  A pigtail diagnostic catheter was passed through the femoral arterial sheath under fluoroscopic guidance into the aortic root.  A temporary transvenous pacemaker catheter was passed through the femoral venous sheath under fluoroscopic guidance into the right ventricle.  The pacemaker was tested to ensure stable lead placement and pacemaker capture. Aortic root angiography was performed in order to determine the optimal angiographic angle for valve deployment.  TRANSFEMORAL ACCESS:  A micropuncture kit was used to gain access to the right femoral artery using u/s guidance. Position confirmed with angiography. Pre-closure with double ProGlide closure devices. The patient was heparinized systemically and ACT verified > 250 seconds.    A 18 Fr transfemoral Dry Seal sheath was introduced into the right femoral artery after progressively dilating over an Amplatz superstiff wire. An AL-2 catheter was used to direct a straight-tip exchange length wire across the native aortic valve into the left ventricle. This was exchanged out for a pigtail catheter and position was confirmed in the LV apex. Simultaneous LV and Ao pressures were recorded.  The pigtail catheter was then exchanged for a Confida wire in the LV apex.   TRANSCATHETER HEART VALVE DEPLOYMENT:  A Medtronic Evolut Pro 29 mm valve was prepared and per manufacturer's guidelines, and the proper orientation of the valve is confirmed on the delivery system. The valve was advanced through the introducer sheath using normal technique until in an appropriate position in the  ascending aorta and position confirmed with angiography. The valve was then advanced across the aortic arch using appropriate flexion of the catheter. The valve was carefully positioned across the aortic valve annulus. Once final position of the valve has been confirmed by angiographic assessment, the valve is deployed while pacing at 180 bpm. There is felt to be no paravalvular leak and no central aortic insufficiency.  The patient's hemodynamic recovery following valve deployment is good.  The deployment device and guidewire are both removed. Echo demostrated acceptable post-procedural gradients, stable mitral valve function, and no AI.   PROCEDURE COMPLETION:  The sheath was then removed and closure devices were completed. Protamine was administered once femoral arterial repair was complete. The temporary pacemaker, pigtail catheters and femoral sheaths were removed with a Mynx closure device placed in the artery and manual pressure used for venous hemostasis.    The patient tolerated the procedure well and is transported to the surgical intensive care in stable condition. There were no immediate intraoperative complications. All sponge instrument and needle counts are verified correct at completion of the operation.   No blood products were administered during the operation.  See tech notes for contrast used.   Lauree Chandler MD 09/01/2020 11:37 AM

## 2020-09-01 NOTE — Anesthesia Procedure Notes (Signed)
Procedure Name: MAC Date/Time: 09/01/2020 9:45 AM Performed by: Janace Litten, CRNA Pre-anesthesia Checklist: Patient identified, Emergency Drugs available, Suction available and Patient being monitored Patient Re-evaluated:Patient Re-evaluated prior to induction Oxygen Delivery Method: Simple face mask

## 2020-09-01 NOTE — Progress Notes (Signed)
°  [facility]   MULTIDISCIPLINARY HEART VALVE TEAM  Patient doing well s/p TAVR. He is hemodynamically stable. Groin sites stable. ECG with sinus and no HAVB. Arterial line discontinued and transferred  to 4E. Plan for early ambulation after bedrest completed and hopeful discharge over the next 24-48 hours.   Cline Crock PA-C  [facility]  Pager [phone]

## 2020-09-01 NOTE — H&P (Signed)
Cardiology Admission History and Physical:   Patient ID: Lawrence Rose MRN: [id number]; DOB: [birth year]   Admission date: 09/01/2020  Primary Care Provider: Patrecia Rose, Lawrence Grief, MD [facility] Cardiologist: Lawrence Lean, MD  [place] Electrophysiologist:  [facility]   Chief Complaint:     History of Present Illness:   Lawrence Rose is a 64 yo male with history of HTN, HLD, anemia, morbid obesity, sleep apnea, CKD stage 3, pulm HTN, Mild MS and severe AS here today for TAVR. Echo with LVEF=60% with severe AS, mean gradient 51 mmHg, peak gradient 83 mmHg. Cath with widely patent coronary arteries. Plans for 29 mm Medtronic Evolut Pro valve from transfemoral approach today.   Pt with recent fatigue, progressive dyspnea on exertion.    Past Medical History:  Diagnosis Date  . Acid reflux   . Allergic arthritis, hand    per patient, both hands  . Apnea   . Asthma   . Carpal tunnel syndrome    per patient, both hands  . Chronic back pain   . Chronic knee pain   . Diabetes mellitus without complication ([place])   . Hypertension   . Severe aortic stenosis   . Sleep apnea    wears CPAP every night    Past Surgical History:  Procedure Laterality Date  . BACK SURGERY    . RIGHT/LEFT HEART CATH AND CORONARY ANGIOGRAPHY N/A 08/18/2020   Procedure: RIGHT/LEFT HEART CATH AND CORONARY ANGIOGRAPHY;  Surgeon: [name], MD;  Location: [facility];  Service: Cardiovascular;  Laterality: N/A;  . TEE WITHOUT CARDIOVERSION N/A 08/21/2020   Procedure: TRANSESOPHAGEAL ECHOCARDIOGRAM (TEE);  Surgeon: [name], MD;  Location: [facility];  Service: Cardiovascular;  Laterality: N/A;     Medications Prior to Admission: Prior to Admission medications   Medication Sig Start Date End Date Taking? Authorizing Provider  albuterol (VENTOLIN HFA) 108 (90 Base) MCG/ACT inhaler Inhale 2 puffs into the lungs every 4 (four) hours as needed for wheezing or shortness of  breath.  08/22/16  Yes [provider]  aspirin EC 81 MG tablet Take 81 mg by mouth at bedtime. Swallow whole.   Yes [provider]  furosemide (LASIX) 40 MG tablet Take 0.5 tablets (20 mg total) by mouth daily. 08/26/20  Yes Charlynne Cousins, MD  gabapentin (NEURONTIN) 300 MG capsule Take 600 mg by mouth 2 (two) times daily.  08/12/20  Yes [provider]  HYDROcodone-acetaminophen (NORCO) 10-325 MG tablet Take 1 tablet by mouth every 6 (six) hours as needed for moderate pain.  08/04/20  Yes [provider]  latanoprost (XALATAN) 0.005 % ophthalmic solution Place 1 drop into both eyes at bedtime.  07/27/20  Yes [provider]  lisinopril (ZESTRIL) 40 MG tablet Take 1 tablet (40 mg total) by mouth at bedtime. 08/26/20  Yes Charlynne Cousins, MD  metFORMIN (GLUCOPHAGE) 1000 MG tablet Take 1,000 mg by mouth 2 (two) times daily. 07/10/20  Yes [provider]  metoprolol tartrate (LOPRESSOR) 25 MG tablet Take 1 tablet (25 mg total) by mouth 2 (two) times daily. 08/23/20  Yes Charlynne Cousins, MD  oxyCODONE ER [facility] ER) 9 MG C12A Take 9 mg by mouth every 12 (twelve) hours.   Yes [provider]  pantoprazole (PROTONIX) 40 MG tablet Take 40 mg by mouth 2 (two) times daily. 05/26/20  Yes [provider]  potassium chloride SA (KLOR-CON) 20 MEQ tablet Take 1 tablet (20 mEq total) by mouth daily.  08/26/20  Yes Charlynne Cousins, MD  rosuvastatin (CRESTOR) 20 MG tablet Take 20 mg by mouth daily.  05/25/20  Yes [provider]  Blood Glucose Monitoring Suppl (FIFTY50 GLUCOSE METER 2.0) w/Device KIT See admin instructions. 05/16/19   [provider]  Cholecalciferol 25 MCG (1000 UT) tablet Take 1,000 Units by mouth at bedtime.  Patient not taking: Reported on 08/26/2020    [provider]  Multiple Vitamin (MULTIVITAMIN WITH MINERALS) TABS tablet Take 1 tablet by mouth at bedtime. Patient not taking:  Reported on 08/26/2020    [provider]  PRESCRIPTION MEDICATION Inhale into the lungs at bedtime. CPAP    [provider]     Allergies:    Allergies  Allergen Reactions  . Cocoa Hives  . Sulfamethoxazole-Trimethoprim Anaphylaxis  . Chocolate Hives  . Citrus Hives    Reaction to oranges and orange juice  . Fish Allergy Hives and Swelling    Throat swelling  . Shellfish Allergy Hives and Swelling    Throat swelling  . Sulfa Antibiotics Hives and Swelling    Throat swelling  . Tetracycline Swelling    Social History:   Social History   Socioeconomic History  . Marital status: Single    Spouse name: Not on file  . Number of children: Not on file  . Years of education: Not on file  . Highest education level: Not on file  Occupational History  . Not on file  Tobacco Use  . Smoking status: Former Research scientist (life sciences)  . Smokeless tobacco: Never Used  . Tobacco comment: per patient quit over 30 years ago  Vaping Use  . Vaping Use: Never used  Substance and Sexual Activity  . Alcohol use: No  . Drug use: Not Currently  . Sexual activity: Not on file  Other Topics Concern  . Not on file  Social History Narrative  . Not on file   Social Determinants of Health   Financial Resource Strain:   . Difficulty of Paying Living Expenses: Not on file  Food Insecurity:   . Worried About Charity fundraiser in the Last Year: Not on file  . Ran Out of Food in the Last Year: Not on file  Transportation Needs:   . Lack of Transportation (Medical): Not on file  . Lack of Transportation (Non-Medical): Not on file  Physical Activity:   . Days of Exercise per Week: Not on file  . Minutes of Exercise per Session: Not on file  Stress:   . Feeling of Stress : Not on file  Social Connections:   . Frequency of Communication with Friends and Family: Not on file  . Frequency of Social Gatherings with Friends and Family: Not on file  . Attends Religious Services: Not on file  .  Active Member of Clubs or Organizations: Not on file  . Attends Archivist Meetings: Not on file  . Marital Status: Not on file  Intimate Partner Violence:   . Fear of Current or Ex-Partner: Not on file  . Emotionally Abused: Not on file  . Physically Abused: Not on file  . Sexually Abused: Not on file    Family History:   The patient's family history is not on file.    ROS:  Please see the history of present illness.  All other ROS reviewed and negative.     Physical Exam/Data:   Vitals:   09/01/20 0729  BP: (!) 151/71  Pulse: 90  Resp: 18  Temp: 98.7 F (37.1 C)  TempSrc: Oral  SpO2: 99%  Weight: (!) 139.3 kg  Height: '5\' 11"'  (1.803 m)   No intake or output data in the 24 hours ending 09/01/20 0810 Last 3 Weights 09/01/2020 08/27/2020 08/23/2020  Weight (lbs) 307 lb 307 lb 6.4 oz 313 lb 1.6 oz  Weight (kg) 139.254 kg 139.436 kg 142.021 kg     Body mass index is 42.82 kg/m.  General:  Well nourished, well developed, in no acute distress HEENT: normal Lymph: no adenopathy Neck: no JVD Endocrine:  No thryomegaly Vascular: No carotid bruits; FA pulses 2+ bilaterally without bruits  Cardiac:  normal S1, S2; RRR; systolic murmur noted.  Lungs:  clear to auscultation bilaterally, no wheezing, rhonchi or rales  Abd: soft, nontender, no hepatomegaly  Ext: no LE edema Musculoskeletal:  No deformities, BUE and BLE strength normal and equal Skin: warm and dry  Neuro:  CNs 2-12 intact, no focal abnormalities noted Psych:  Normal affect    Relevant CV Studies: Echo 08/17/20  IMPRESSIONS 1. Left ventricular ejection fraction, by estimation, is 60 to 65%. The  left ventricle has normal function. The left ventricle has no regional  wall motion abnormalities. There is moderate concentric left ventricular  hypertrophy. Left ventricular  diastolic parameters are indeterminate.  2. Right ventricular systolic function is normal. The right ventricular  size is normal.   3. Left atrial size was severely dilated.  4. The mitral valve is normal in structure. Trivial mitral valve  regurgitation. The posterior leaflet appears to have limited mobility with  mild flow acceleration noted across the mitral valve with mean gradient  38mHg at HR 101. There is mild narrowing  of the mitral valve orfice without significant stenosis.  5. The aortic valve is tricuspid. There is severe calcifcation of the  aortic valve. There is severe thickening of the aortic valve. Aortic valve  regurgitation is not visualized. Severe aortic valve stenosis with AVA 0.9  (by continuity), mean gradient  537mg, peak gradient 8255m, Vmax 4.65cm2, DOI 0.2.  6. The inferior vena cava is dilated in size with >50% respiratory  variability, suggesting right atrial pressure of 8 mmHg.   FINDINGS  Left Ventricle: Left ventricular ejection fraction, by estimation, is 60  to 65%. The left ventricle has normal function. The left ventricle has no  regional wall motion abnormalities. The left ventricular internal cavity  size was normal in size. There is  moderate concentric left ventricular hypertrophy. Left ventricular  diastolic parameters are indeterminate.   Right Ventricle: The right ventricular size is normal. Right vetricular  wall thickness was not well visualized. Right ventricular systolic  function is normal.   Left Atrium: Left atrial size was severely dilated.   Right Atrium: Right atrial size was normal in size.   Pericardium: There is no evidence of pericardial effusion.   Mitral Valve: The mitral valve is normal in structure. There is mild  thickening of the mitral valve leaflet(s). Trivial mitral valve  regurgitation. The posterior leaflet appears to have limited mobility with  mild flow acceleration noted across the mitral  valve with mean gradient 6mm32mat HR 101. There is mild narrowing fo the  mitral valve orfice without significant stenosis. MV peak gradient, 10.1   mmHg. The mean mitral valve gradient is 6.0 mmHg.   Tricuspid Valve: The tricuspid valve is normal in structure. Tricuspid  valve regurgitation is trivial.   Aortic Valve: The aortic valve is tricuspid. There is severe calcifcation  of the aortic valve. There is severe thickening of the aortic valve.  Aortic valve regurgitation is not visualized. Severe aortic stenosis is  present. Aortic valve mean gradient  measures 51.0 mmHg. Aortic valve peak gradient measures 83.2 mmHg. Aortic  valve area, by VTI measures 0.89 cm. DOI 0.2.   Pulmonic Valve: The pulmonic valve was normal in structure. Pulmonic valve  regurgitation is trivial.   Aorta: The aortic root and ascending aorta are structurally normal, with  no evidence of dilitation.   Venous: The inferior vena cava is dilated in size with greater than 50%  respiratory variability, suggesting right atrial pressure of 8 mmHg.   IAS/Shunts: No atrial level shunt detected by color flow Doppler.    LEFT VENTRICLE  PLAX 2D  LVIDd: 4.29 cm Diastology  LVIDs: 2.46 cm LV e' medial: 8.05 cm/s  LV PW: 1.61 cm LV e' lateral: 10.40 cm/s  LV IVS: 1.24 cm  LVOT diam: 2.40 cm  LV SV: 90  LV SV Index: 36  LVOT Area: 4.52 cm    RIGHT VENTRICLE  RV S prime: 10.30 cm/s  TAPSE (M-mode): 2.0 cm   LEFT ATRIUM Index RIGHT ATRIUM Index  LA diam: 3.70 cm 1.46 cm/m RA Area: 23.50 cm  LA Vol (A2C): 108.0 ml 42.50 ml/m RA Volume: 75.60 ml 29.75 ml/m  LA Vol (A4C): 123.0 ml 48.40 ml/m  LA Biplane Vol: 120.0 ml 47.22 ml/m  AORTIC VALVE  AV Area (Vmax): 0.97 cm  AV Area (Vmean): 1.18 cm  AV Area (VTI): 0.89 cm  AV Vmax: 456.00 cm/s  AV Vmean: 268.000 cm/s  AV VTI: 1.017 m  AV Peak Grad: 83.2 mmHg  AV Mean Grad: 51.0 mmHg  LVOT Vmax: 97.50 cm/s  LVOT Vmean: 70.000 cm/s  LVOT VTI: 0.200 m  LVOT/AV VTI ratio: 0.20   AORTA  Ao Root diam: 3.20 cm  Ao Asc diam: 3.40 cm   MITRAL VALVE  MV Peak grad: 10.1 mmHg SHUNTS  MV Mean  grad: 6.0 mmHg Systemic VTI: 0.20 m  MV Vmax: 1.59 m/s Systemic Diam: 2.40 cm  MV Vmean: 117.0 cm/s   Gwyndolyn Kaufman MD  Electronically signed by Gwyndolyn Kaufman MD  Signature Date/Time: 08/17/2020/12:41:56 PM  _____________________  [facility] 08/18/20  Procedures  RIGHT/LEFT HEART CATH AND CORONARY ANGIOGRAPHY  Conclusion   Widely patent coronary arteries   High cardiac output: 9.06 L/min by Fick and 9.23 L/min by thermal dilution. Etiology uncertain. Suppressed TSH with normal T4 raising question of T3 thyrotoxicosis versus other mechanism.   Severe pulmonary hypertension, mean pressure 48 mmHg, based upon hemodynamics WHO group 2   Pulmonary capillary wedge pressure mean 34 mmHg, pulmonary vascular resistance 1.55 Woods units.   Mean right atrial pressure 19 mmHg   Aortic valve gradient peak to peak 33 mmHg with mean gradient 22 mmHg.   Aortic valve area 1.94 cm by Fick cardiac output and 1.98 cm by thermodilution   LV function normal by echocardiography with moderate LVH RECOMMENDATIONS:   Further work-up/management per treating team.   Needs further diuresis.   T3 level ____________________   TEE 08/21/20  IMPRESSIONS  1. Severe AS (mean gradient 42 mmHg and peak velocity of 4.2 m/s); mild  AI.  2. Left ventricular ejection fraction, by estimation, is 70 to 75%. The  left ventricle has hyperdynamic function. There is severe left ventricular  hypertrophy.  3. Right ventricular systolic function is normal. The right ventricular  size is normal.  4. Left atrial size was moderately  dilated. No left atrial/left atrial  appendage thrombus was detected.  5. The mitral valve is normal in structure. Trivial mitral valve  regurgitation.  6. The aortic valve is tricuspid. Aortic valve regurgitation is mild.  Severe aortic valve stenosis.  7. There is Moderate (Grade III) plaque involving the descending aorta.   FINDINGS  Left Ventricle: Left ventricular ejection  fraction, by estimation, is 70  to 75%. The left ventricle has hyperdynamic function. The left ventricular  internal cavity size was normal in size. There is severe left ventricular  hypertrophy.   Right Ventricle: The right ventricular size is normal. Right vetricular  wall thickness was not assessed. Right ventricular systolic function is  normal.   Left Atrium: Left atrial size was moderately dilated. No left atrial/left  atrial appendage thrombus was detected.   Right Atrium: Right atrial size was normal in size.   Pericardium: There is no evidence of pericardial effusion.   Mitral Valve: The mitral valve is normal in structure. Mild mitral annular  calcification. Trivial mitral valve regurgitation. MV peak gradient, 5.8  mmHg. The mean mitral valve gradient is 2.0 mmHg.   Tricuspid Valve: The tricuspid valve is normal in structure. Tricuspid  valve regurgitation is mild.   Aortic Valve: The aortic valve is tricuspid. Aortic valve regurgitation is  mild. Severe aortic stenosis is present. Aortic valve mean gradient  measures 41.5 mmHg. Aortic valve peak gradient measures 66.7 mmHg. Aortic  valve area, by VTI measures 1.04 cm.   Pulmonic Valve: The pulmonic valve was normal in structure. Pulmonic valve  regurgitation is not visualized.   Aorta: The aortic root is normal in size and structure. There is moderate  (Grade III) plaque involving the descending aorta.   IAS/Shunts: No atrial level shunt detected by color flow Doppler.   Additional Comments: Severe AS (mean gradient 42 mmHg and peak velocity of  4.2 m/s); mild AI.    LEFT VENTRICLE  PLAX 2D  LVOT diam: 2.20 cm  LV SV: 81  LV SV Index: 32  LVOT Area: 3.80 cm    AORTIC VALVE  AV Area (Vmax): 1.01 cm  AV Area (Vmean): 0.92 cm  AV Area (VTI): 1.04 cm  AV Vmax: 408.50 cm/s  AV Vmean: 307.500 cm/s  AV VTI: 0.778 m  AV Peak Grad: 66.7 mmHg  AV Mean Grad: 41.5 mmHg  LVOT Vmax: 108.00 cm/s  LVOT  Vmean: 74.800 cm/s  LVOT VTI: 0.214 m  LVOT/AV VTI ratio: 0.27   MITRAL VALVE  MV Peak grad: 5.8 mmHg SHUNTS  MV Mean grad: 2.0 mmHg Systemic VTI: 0.21 m  MV Vmax: 1.20 m/s Systemic Diam: 2.20 cm  MV Vmean: 60.7 cm/s   Kirk Ruths MD  Electronically signed by Kirk Ruths MD  Signature Date/Time: 08/21/2020/9:21:38 AM  Final  ____________________  Cardiac CT 08/19/20  Addenda  ADDENDUM REPORT: 08/20/2020 08:01  EXAM:  OVER-READ INTERPRETATION CT CHEST  The following report is an over-read performed by radiologist Dr.  Rebekah [facility] on 08/20/2020. This  over-read does not include interpretation of cardiac or coronary  anatomy or pathology. The coronary calcium score and cardiac CTA  interpretation by the cardiologist is attached.  COMPARISON: Chest CTA 08/15/2020.  FINDINGS:  Extracardiac findings will be described separately under dictation  for contemporaneously obtained CTA chest, abdomen and pelvis.  IMPRESSION:  Please see separate dictation for contemporaneously obtained CTA  chest, abdomen and pelvis 08/19/2020 for full description of  relevant extracardiac findings.  Electronically Signed  By: Vinnie Langton M.D.  On: 08/20/2020 08:01   Signed by Etheleen Mayhew, MD on 08/20/2020 8:04 AM  Narrative & Impression  MEDICATIONS:  MEDICATIONS  [facility]  EXAM:  Cardiac TAVR CT  TECHNIQUE:  The patient was scanned on a Siemens Force 409 slice scanner. A 120  kV retrospective scan was triggered in the descending thoracic aorta  at 111 HU's. Gantry rotation speed was 270 msecs and collimation was  .9 mm. No beta blockade or nitro were given. The 3D data set was  reconstructed in 5% intervals of the R-R cycle. Systolic and  diastolic phases were analyzed on a dedicated work station using  MPR, MIP and VRT modes. The patient received 80 cc of contrast.  FINDINGS:  Aortic Valve: Calcium score 1645 Tri leaflet with restricted motion  Aorta:  No aneurysm moderate calcific atherosclerosis normal arch  vessels  Sino-tubular Junction: 25 mm  Ascending Thoracic Aorta: 33 mm  Aortic Arch: 25 mm  Descending Thoracic Aorta: 25 mm  Sinus of Valsalva Measurements:  Non-coronary: 30.8 mm  Right - coronary: 28.2 mm  Left - coronary: 31.3 mm  Coronary Artery Height above Annulus:  Left Main: 11.1 mm above annulus  Right Coronary: 15.3 mm above annulus  Virtual Basal Annulus Measurements:  Maximum/Minimum Diameter: 27.4 mm x 22.8 mm  Perimeter: 82 mm  Area: 505 mm2  Coronary Arteries: Sufficient height above annulus for deployment  Optimum Fluoroscopic Angle for Delivery: LAO 3 Caudal 6 degrees  IMPRESSION:  1. Tri-leaflet AV with calcium score 1645 and annular area of 505  mm2 suitable for a 26 mm Sapien 3 valve  2. Coronary arteries sufficient height above annulus for deployment  3. Optimum angiographic angle for deployment LAO 3 Caudal 6 degrees  4. Some nodular calcification in annulus at base of left coronary  cusp  5. Normal aortic root 3.3 cm  Jenkins Rouge   ____________________  CT chest/abd/pelvis 08/19/20  Final [99]  Study Result  Narrative & Impression  CLINICAL DATA: 64 year old male with history of severe aortic  stenosis. Preprocedural study prior to potential transcatheter  aortic valve replacement (TAVR) procedure.  EXAM:  CT ANGIOGRAPHY CHEST, ABDOMEN AND PELVIS  TECHNIQUE:  Non-contrast CT of the chest was initially obtained.  Multidetector CT imaging through the chest, abdomen and pelvis was  performed using the standard protocol during bolus administration of  intravenous contrast. Multiplanar reconstructed images and MIPs were  obtained and reviewed to evaluate the vascular anatomy.  CONTRAST: 165m OMNIPAQUE IOHEXOL 350 MG/ML SOLN  COMPARISON: [facility].  FINDINGS:  CTA CHEST FINDINGS  Cardiovascular: Heart size is mildly enlarged. There is no  significant pericardial fluid, thickening or pericardial   calcification. There is aortic atherosclerosis, as well as  atherosclerosis of the great vessels of the mediastinum and the  coronary arteries, including calcified atherosclerotic plaque in the  left anterior descending, left circumflex and right coronary  arteries. Severe thickening calcification of the aortic valve. Mild  calcifications of the mitral annulus.  Mediastinum/Nodes: No pathologically enlarged mediastinal or hilar  lymph nodes. Esophagus is unremarkable in appearance. No axillary  lymphadenopathy.  Lungs/Pleura: Patchy areas of ground-glass attenuation and mild  interlobular septal thickening noted throughout the lungs  bilaterally, favored to reflect a background of mild interstitial  pulmonary edema. No confluent consolidative airspace disease.  Moderate right and small left pleural effusions lying dependently  with some associated passive subsegmental atelectasis in the lower  lobes of the lungs bilaterally. Mild scarring in the  medial aspect  of the right upper lobe and inferior segment of the lingula.  Musculoskeletal: There are no aggressive appearing lytic or blastic  lesions noted in the visualized portions of the skeleton.  CTA ABDOMEN AND PELVIS FINDINGS  Hepatobiliary: Diffuse low attenuation throughout the hepatic  parenchyma, indicative of a background of hepatic steatosis. In the  inferior aspect of segment 5 of the liver (axial image 134 of series  4) there is a small hypovascular lesion measuring 1.3 cm in diameter  (44 HU), which is indeterminate. No other larger more suspicious  appearing hepatic lesions. No intra or extrahepatic biliary ductal  dilatation. Gallbladder is normal in appearance.  Pancreas: No pancreatic mass. No pancreatic ductal dilatation. No  pancreatic or peripancreatic fluid collections or inflammatory  changes.  Spleen: Unremarkable.  Adrenals/Urinary Tract: Subcentimeter low-attenuation lesions in the  right kidney, too small to  characterize, but statistically likely to  represent cysts. Left kidney and bilateral adrenal glands are normal  in appearance. No hydroureteronephrosis. Urinary bladder is normal  in appearance.  Stomach/Bowel: The appearance of the stomach is normal. No  pathologic dilatation of small bowel or colon. Normal appendix.  Vascular/Lymphatic: Aortic atherosclerosis, with vascular findings  and measurements pertinent to potential TAVR procedure, as detailed  below. No aneurysm or dissection noted in the abdominal or pelvic  vasculature. No lymphadenopathy noted in the abdomen or pelvis.  Reproductive: Prostate gland and seminal vesicles are unremarkable  in appearance.  Other: Moderate-sized umbilical hernia containing only omental fat.  Left inguinal hernia containing predominantly fat. No significant  volume of ascites. No pneumoperitoneum.  Musculoskeletal: There are no aggressive appearing lytic or blastic  lesions noted in the visualized portions of the skeleton.  VASCULAR MEASUREMENTS PERTINENT TO TAVR:  AORTA:  Minimal Aortic Diameter-13 x 12 mm  Severity of Aortic Calcification-moderate to severe  RIGHT PELVIS:  Right Common Iliac Artery -  Minimal Diameter-7.3 x 7.8 mm  Tortuosity - mild  Calcification-moderate  Right External Iliac Artery -  Minimal Diameter-8.1 x 4.2 mm  Tortuosity - mild  Calcification-mild  Right Common Femoral Artery -  Minimal Diameter-7.9 x 7.6 mm  Tortuosity - mild  Calcification-mild  LEFT PELVIS:  Left Common Iliac Artery -  Minimal Diameter-7.4 x 7.8 mm  Tortuosity - mild  Calcification-moderate  Left External Iliac Artery -  Minimal Diameter-7.7 x 5.8 mm  Tortuosity - mild  Calcification-[facility]  Left Common Femoral Artery -  Minimal Diameter-8.2 x 8.2 mm  Tortuosity-mild  Calcification-minimal  Review of the MIP images confirms the above findings.  IMPRESSION:  1. Vascular findings and measurements pertinent to potential TAVR   procedure, as detailed above.  2. Severe thickening calcification of the aortic valve, compatible  with the reported clinical history of severe aortic stenosis.  3. Aortic atherosclerosis, in addition to three vessel coronary  artery disease. Please note that although the presence of coronary  artery calcium documents the presence of coronary artery disease,  the severity of this disease and any potential stenosis cannot be  assessed on this non-gated CT examination. Assessment for potential  risk factor modification, dietary therapy or pharmacologic therapy  may be warranted, if clinically indicated.  4. Cardiomegaly with evidence of interstitial pulmonary edema and  bilateral pleural effusions (right greater than left); imaging  findings suggestive of congestive heart failure.  5. Hepatic steatosis.  6. 1.3 cm indeterminate hypovascular lesion in segment 5 of the  liver. Correlation with nonemergent abdominal MRI with and without  IV  gadolinium is recommended in the near future to definitively  characterize this lesion and exclude neoplasm.  7. Additional incidental findings, as above.      Laboratory Data:  High Sensitivity Troponin:   Recent Labs  Rose 08/15/20 1955 08/15/20 2241 08/16/20 1459 08/16/20 1815 08/16/20 2105  TROPONINIHS 402* 330* 148* 164* 176*  175*      Chemistry Recent Labs  Rose 08/27/20 1600  NA 137  K 4.5  CL 100  CO2 25  GLUCOSE 139*  BUN 20  CREATININE 1.31*  CALCIUM 9.5  GFRNONAA >60  ANIONGAP 12    Recent Labs  Rose 08/27/20 1600  PROT 7.5  ALBUMIN 3.6  AST 21  ALT 18  ALKPHOS 46  BILITOT 0.4   Hematology Recent Labs  Rose 08/27/20 1600  WBC 7.0  RBC 4.37  HGB 10.9*  HCT 37.0*  MCV 84.7  MCH 24.9*  MCHC 29.5*  RDW 15.0  PLT 274   BNP Recent Labs  Rose 08/27/20 1600  BNP 48.3    DDimer No results for input(s): DDIMER in the last 168 hours.   Radiology/Studies:  No results found.   Assessment and Plan:    1. Severe AS: Will plan TAVR today with 29 Medtronic Evolut Pro valve from the transfemoral approach.    For questions or updates, please contact [facility] Please consult [email or web address] for contact info under     Signed, Lauree Chandler, MD  09/01/2020 8:10 AM

## 2020-09-01 NOTE — Discharge Instructions (Signed)
ACTIVITY AND EXERCISE °• Daily activity and exercise are an important part of your recovery. People recover at different rates depending on their general health and type of valve procedure. °• Most people recovering from TAVR feel better relatively quickly  °• No lifting, pushing, pulling more than 10 pounds (examples to avoid: groceries, vacuuming, gardening, golfing): °            - For one week with a procedure through the groin. °            - For six weeks for procedures through the chest wall or neck. °NOTE: You will typically see one of our providers 7-14 days after your procedure to discuss WHEN TO RESUME the above activities.  °  °  °DRIVING °• Do not drive until you are seen for follow up and cleared by a provider. Generally, we ask patient to not drive for 1 week after their procedure. °• If you have been told by your doctor in the past that you may not drive, you must talk with him/her before you begin driving again. °  °DRESSING °• Groin site: you may leave the clear dressing over the site for up to one week or until it falls off. °  °HYGIENE °• If you had a femoral (leg) procedure, you may take a shower when you return home. After the shower, pat the site dry. Do NOT use powder, oils or lotions in your groin area until the site has completely healed. °• If you had a chest procedure, you may shower when you return home unless specifically instructed not to by your discharging practitioner. °            - DO NOT scrub incision; pat dry with a towel. °            - DO NOT apply any lotions, oils, powders to the incision. °            - No tub baths / swimming for at least 2 weeks. °• If you notice any fevers, chills, increased pain, swelling, bleeding or pus, please contact your doctor. °  °ADDITIONAL INFORMATION °• If you are going to have an upcoming dental procedure, please contact our office as you will require antibiotics ahead of time to prevent infection on your heart valve.  ° ° °If you have any  questions or concerns you can call the structural heart phone during normal business hours 8am-4pm. If you have an urgent need after hours or weekends please call [phone] to talk to the on call provider for general cardiology. If you have an emergency that requires immediate attention, please call 911.  ° ° °After TAVR Checklist ° °Check  Test Description  ° Follow up appointment in 1-2 weeks  You will see our structural heart physician assistant, Katie Gurnoor Ursua. Your incision sites will be checked and you will be cleared to drive and resume all normal activities if you are doing well.    ° 1 month echo and follow up  You will have an echo to check on your new heart valve and be seen back in the office by Katie Savaughn Karwowski. Many times the echo is not read by your appointment time, but Katie will call you later that day or the following day to report your results.  ° Follow up with your primary cardiologist You will need to be seen by your primary cardiologist in the following 3-6 months after your 1 month appointment in the valve   clinic. Often times your Plavix or Aspirin will be discontinued during this time, but this is decided on a case by case basis.   ° 1 year echo and follow up You will have another echo to check on your heart valve after 1 year and be seen back in the office by Katie Sekai Nayak. This your last structural heart visit.  ° Bacterial endocarditis prophylaxis  You will have to take antibiotics for the rest of your life before all dental procedures (even teeth cleanings) to protect your heart valve. Antibiotics are also required before some surgeries. Please check with your cardiologist before scheduling any surgeries. Also, please make sure to tell us if you have a penicillin allergy as you will require an alternative antibiotic.   ° ° °

## 2020-09-01 NOTE — Op Note (Signed)
[facility]   MULTIDISCIPLINARY HEART VALVE TEAM   TAVR OPERATIVE NOTE  Lawrence Rose [id number]  Date of Procedure:                 09/01/2020  Preoperative Diagnosis:      Severe Aortic Stenosis   Postoperative Diagnosis:    Same   Procedure:        Transcatheter Aortic Valve Replacement - Percutaneous Right Transfemoral Approach             Medtronic Evolut-Pro + (size 29 mm,  serial # [id number])              Co-Surgeons:            Alleen Borne, MD and Verne Carrow, MD   Anesthesiologist:                  Marcene Duos, MD  Echocardiographer:              Tobias Alexander, MD  Pre-operative Echo Findings: ? Severe aortic stenosis and moderate AI ?  Normal left ventricular systolic function  Post-operative Echo Findings: ? No paravalvular leak ? Normal left ventricular systolic function    BRIEF CLINICAL NOTE AND INDICATIONS FOR SURGERY  This 64 year old morbidly obese gentleman presents with New York Heart Association class III symptoms of fatigue and shortness of breath with minimal ambulation which he says just started about a week or so prior to admission. 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 I have personally reviewed his 2D echocardiogram, TEE, cardiac catheterization, and CTA images. His TEE today shows a calcified poorly mobile aortic valve with a mean gradient of 42 mmHg consistent with severe aortic stenosis. There is trivial mitral regurgitation and mild mitral annular calcification. The mean gradient across the mitral valve was 2 mmHg and peak gradient 5.8 mmHg. Left ventricular systolic function is normal. Cardiac catheterization shows no significant coronary disease. There was severe pulmonary hypertension and elevated mean right atrial pressure of  19 mmHg. He says that since admission his symptoms have significantly improved with diuresis. I think aortic valve replacement is indicated in this patient for relief of his symptoms and prevent progressive left ventricular deterioration. I think he would be a high risk patient for open surgical aortic valve replacement due to his morbid obesity, limited mobility, OSA, stage III chronic kidney disease, and other comorbid risk factors. I think TAVR would be a reasonable alternative for him. His gated cardiac CTA shows anatomy suitable for TAVR using a 26 mm Sapien 3 valve. Given his BSA of 2.6 m and BMI of 42.3 kg/m it may be worthwhile evaluating him for a Medtronic Evolut valve to give him the lowest postoperative mean aortic valve gradient. His abdominal and pelvic CTA shows adequate pelvic vascular anatomy to allow transfemoral insertion.  The patient was counseled at length regarding treatment alternatives for management of severe symptomatic aortic stenosis. The risks and benefits of surgical intervention has been discussed in detail. Long-term prognosis with medical therapy was discussed. Alternative approaches such as conventional surgical aortic valve replacement, transcatheter aortic valve replacement, and palliative medical therapy were compared and contrasted at length. This discussion was placed in the context of the patient's own specific clinical presentation and past medical history. All of his  questions have been addressed.  Following the decision to proceed with transcatheter aortic valve replacement, a discussion was held regarding what types of management strategies would be attempted intraoperatively in the event of life-threatening complications, including whether or not the patient would be considered a candidate for the use of cardiopulmonary bypass and/or conversion to open sternotomy for attempted surgical intervention. The patient is aware of the fact that transient use of  cardiopulmonary bypass may be necessary. I think he would be a candidate for emergent sternotomy if needed to manage any intraoperative complications. The patient has been advised of a variety of complications that might develop including but not limited to risks of death, stroke, paravalvular leak, aortic dissection or other major vascular complications, aortic annulus rupture, device embolization, cardiac rupture or perforation, mitral regurgitation, acute myocardial infarction, arrhythmia, heart block or bradycardia requiring permanent pacemaker placement, congestive heart failure, respiratory failure, renal failure, pneumonia, infection, other late complications related to structural valve deterioration or migration, or other complications that might ultimately cause a temporary or permanent loss of functional independence or other long term morbidity. The patient provides full informed consent for the procedure as described and all questions were answered.     DETAILS OF THE OPERATIVE PROCEDURE  PREPARATION:    The patient is brought to the operating room on the above mentioned date and central monitoring was established by the anesthesia team including placement of a central venous line and radial arterial line. The patient is placed in the supine position on the operating table.  Intravenous antibiotics are administered. The patient is monitored by anesthesia under conscious sedation.  Baseline transthoracic echocardiogram was performed. The patient's abdomen and both groins are prepared and draped in a sterile manner. A time out procedure is performed.   PERIPHERAL ACCESS:    Using the modified Seldinger technique, femoral arterial and venous access was obtained with placement of 6 Fr sheaths on the left side.  A pigtail diagnostic catheter was passed through the left arterial sheath under fluoroscopic guidance into the aortic root.  A temporary transvenous pacemaker catheter was  passed through the left femoral venous sheath under fluoroscopic guidance into the right ventricle.  The pacemaker was tested to ensure stable lead placement and pacemaker capture.    TRANSFEMORAL ACCESS:   Percutaneous transfemoral access and sheath placement was performed using ultrasound guidance.  The right common femoral artery was cannulated using a micropuncture needle.  A pair of Abbott Perclose percutaneous closure devices were placed and a 6 French sheath replaced into the femoral artery.  The patient was heparinized systemically and ACT verified > 250 seconds.    An 18 Fr transfemoral Gore Dry-Seal sheath was introduced into the right femoral artery after progressively dilating over an Amplatz superstiff wire. An AL-2 catheter was used to direct a straight-tip exchange length wire across the native aortic valve into the left ventricle. This was exchanged out for a pigtail catheter and position was confirmed in the LV apex. Simultaneous LV and Ao pressures were recorded.  The pigtail catheter was exchanged for an Amplatz Super-stiff wire in the LV apex.     BALLOON AORTIC VALVULOPLASTY:   Performed using a 20 F True balloon under rapid pacing. Hemodynamic recovery was rapid.    TRANSCATHETER HEART VALVE DEPLOYMENT:   A Medtronic Evolut-Pro + transcatheter heart valve (size 29 mm) was prepared and loaded into the Medtronic delivery catheter system per manufacturer's guidelines and the proper orientation of the valve is confirmed under fluoroscopy. The  delivery system and inline sheath were inserted into the Dry-Seal sheath over the Super-stiff wire under fluoroscopic guidance. The delivery catheter was advanced around the aortic arch and the valve was carefully positioned across the aortic valve annulus. The patient was turned into the cusp overlap projection. An aortic root injection was performed to confirm position and the valve deployed using the normal technique under  fluoroscopic guidance. Intermittent pacing was used during valve deployment. The delivery system and guidewire were retracted into the descending aorta and the nosecone re-sheathed. Valve function is assessed using echocardiography. There is felt to be no paravalvular leak and no central aortic insufficiency. Post-procedure gradient was low.  The patient's hemodynamic recovery following valve deployment is good.      PROCEDURE COMPLETION:   The delivery system and in-line sheath were removed and femoral artery closure performed using the previously placed Perclose devices.  Protamine was administered once femoral arterial repair was complete. The temporary pacemaker, pigtail catheter and femoral sheaths were removed with manual pressure used for hemostasis of the venous sheath and Mynx closure for the arterial sheath.   The patient tolerated the procedure well and is transported to the cath lab recovery area in stable condition. There were no immediate intraoperative complications. All sponge instrument and needle counts are verified correct at completion of the operation.   No blood products were administered during the operation.  The patient received a total of 20 mL of intravenous contrast during the procedure.   Alleen Borne, MD

## 2020-09-01 NOTE — Transfer of Care (Signed)
Immediate Anesthesia Transfer of Care Note  Patient: Lawrence Rose  Procedure(s) Performed: TRANSCATHETER AORTIC VALVE REPLACEMENT, TRANSFEMORAL (N/A ) TRANSESOPHAGEAL ECHOCARDIOGRAM (TEE) (N/A )  Patient Location: PACU and Cath Lab  Anesthesia Type:MAC  Level of Consciousness: drowsy, patient cooperative and responds to stimulation  Airway & Oxygen Therapy: Patient Spontanous Breathing and Patient connected to nasal cannula oxygen  Post-op Assessment: Report given to RN and Post -op Vital signs reviewed and stable  Post vital signs: Reviewed and stable  Last Vitals:  Vitals Value Taken Time  BP 96/56 09/01/20 1150  Temp    Pulse 78 09/01/20 1152  Resp 15 09/01/20 1152  SpO2 100 % 09/01/20 1152  Vitals shown include unvalidated device data.  Last Pain:  Vitals:   09/01/20 0951  TempSrc:   PainSc: 0-No pain      Patients Stated Pain Goal: 2 (62/37/62 8315)  Complications: No complications documented.

## 2020-09-01 NOTE — Progress Notes (Signed)
  Echocardiogram 2D Echocardiogram has been performed.  Lawrence Rose 09/01/2020, 11:27 AM

## 2020-09-01 NOTE — Progress Notes (Signed)
Mobility Specialist: Progress Note   09/01/20 1813  Mobility  Activity Ambulated in hall  Level of Assistance Minimal assist, patient does 75% or more  Assistive Device Front wheel walker  Distance Ambulated (ft) 204 ft  Mobility Response Tolerated fair  Mobility performed by Mobility specialist  Bed Position Semi-fowlers  $Mobility charge 1 Mobility   Pre-Mobility: 89 HR, 131/59 BP, 100% SpO2 Post-Mobility: 94 HR, 130/65 BP, 93% SpO2  Pt c/o knee pain during ambulation. Pt stopped to take three standing rest breaks during ambulation due to knee and groin pain, RN present with chair follow and in room post-mobility for vitals.   [facility] Carlin Mamone Mobility Specialist

## 2020-09-02 ENCOUNTER — Inpatient Hospital Stay (HOSPITAL_COMMUNITY): Payer: Medicare HMO

## 2020-09-02 DIAGNOSIS — Z6841 Body Mass Index (BMI) 40.0 and over, adult: Secondary | ICD-10-CM | POA: Diagnosis not present

## 2020-09-02 DIAGNOSIS — I350 Nonrheumatic aortic (valve) stenosis: Secondary | ICD-10-CM

## 2020-09-02 DIAGNOSIS — Z952 Presence of prosthetic heart valve: Secondary | ICD-10-CM | POA: Diagnosis not present

## 2020-09-02 DIAGNOSIS — Z006 Encounter for examination for normal comparison and control in clinical research program: Secondary | ICD-10-CM | POA: Diagnosis not present

## 2020-09-02 DIAGNOSIS — N179 Acute kidney failure, unspecified: Secondary | ICD-10-CM | POA: Diagnosis not present

## 2020-09-02 DIAGNOSIS — Z954 Presence of other heart-valve replacement: Secondary | ICD-10-CM

## 2020-09-02 LAB — CBC
HCT: 32.7 % — ABNORMAL LOW (ref 39.0–52.0)
Hemoglobin: 9.4 g/dL — ABNORMAL LOW (ref 13.0–17.0)
MCH: 24.4 pg — ABNORMAL LOW (ref 26.0–34.0)
MCHC: 28.7 g/dL — ABNORMAL LOW (ref 30.0–36.0)
MCV: 84.7 fL (ref 80.0–100.0)
Platelets: 198 10*3/uL (ref 150–400)
RBC: 3.86 MIL/uL — ABNORMAL LOW (ref 4.22–5.81)
RDW: 15 % (ref 11.5–15.5)
WBC: 7.2 10*3/uL (ref 4.0–10.5)
nRBC: 0 % (ref 0.0–0.2)

## 2020-09-02 LAB — BASIC METABOLIC PANEL
Anion gap: 8 (ref 5–15)
BUN: 19 mg/dL (ref 8–23)
CO2: 28 mmol/L (ref 22–32)
Calcium: 8.6 mg/dL — ABNORMAL LOW (ref 8.9–10.3)
Chloride: 102 mmol/L (ref 98–111)
Creatinine, Ser: 1.34 mg/dL — ABNORMAL HIGH (ref 0.61–1.24)
GFR, Estimated: 58 mL/min — ABNORMAL LOW (ref >60)
Glucose, Bld: 156 mg/dL — ABNORMAL HIGH (ref 70–99)
Potassium: 4.2 mmol/L (ref 3.5–5.1)
Sodium: 138 mmol/L (ref 135–145)

## 2020-09-02 LAB — MAGNESIUM: Magnesium: 2 mg/dL (ref 1.7–2.4)

## 2020-09-02 MED ORDER — CLOPIDOGREL BISULFATE 75 MG PO TABS: 75.0000 mg | ORAL_TABLET | Freq: Every day | ORAL | 1 refills | Status: DC

## 2020-09-02 MED FILL — Heparin Sod (Porcine)-NaCl IV Soln 1000 Unit/500ML-0.9%: INTRAVENOUS | Qty: 500 | Status: AC

## 2020-09-02 NOTE — Progress Notes (Signed)
1 Day Post-Op Procedure(s) (LRB): TRANSCATHETER AORTIC VALVE REPLACEMENT, TRANSFEMORAL (N/A) TRANSESOPHAGEAL ECHOCARDIOGRAM (TEE) (N/A) Subjective: No complaints. Ambulated last pm but not yet today.  Objective: Vital signs in last 24 hours: Temp:  [97.4 F (36.3 C)-98.9 F (37.2 C)] 98.9 F (37.2 C) (11/17 0751) Pulse Rate:  [69-143] 89 (11/17 0751) Cardiac Rhythm: Normal sinus rhythm (11/17 0400) Resp:  [11-20] 16 (11/17 0751) BP: (92-165)/(49-80) 132/63 (11/17 0751) SpO2:  [0 %-100 %] 98 % (11/17 0751) Weight:  [139.9 kg] 139.9 kg (11/17 0452)  Hemodynamic parameters for last 24 hours:    Intake/Output from previous day: 11/16 0701 - 11/17 0700 In: 2423.6 [P.O.:750; I.V.:973.6; IV Piggyback:700] Out: 1835 [Urine:1805; Blood:30] Intake/Output this shift: Total I/O In: -  Out: 200 [Urine:200]  General appearance: alert and cooperative Neurologic: intact Heart: regular rate and rhythm, S1, S2 normal, no murmur Lungs: clear to auscultation bilaterally Extremities: extremities normal, no edema Wound: groin sites ok  Lab Results: Recent Labs    09/01/20 1234 09/02/20 0152  WBC  --  7.2  HGB 9.9* 9.4*  HCT 29.0* 32.7*  PLT  --  198   BMET:  Recent Labs    09/01/20 1234 09/02/20 0152  NA 140 138  K 4.0 4.2  CL 104 102  CO2  --  28  GLUCOSE 144* 156*  BUN 18 19  CREATININE 0.90 1.34*  CALCIUM  --  8.6*    PT/INR: No results for input(s): LABPROT, INR in the last 72 hours. ABG    Component Value Date/Time   PHART 7.417 09/01/2020 1159   HCO3 22.6 09/01/2020 1159   TCO2 21 (L) 09/01/2020 1234   ACIDBASEDEF 2.0 09/01/2020 1159   O2SAT 100.0 09/01/2020 1159   CBG (last 3)  Recent Labs    09/01/20 0731  GLUCAP 161*   ECG: sinus with PAC's  Assessment/Plan: S/P Procedure(s) (LRB): TRANSCATHETER AORTIC VALVE REPLACEMENT, TRANSFEMORAL (N/A) TRANSESOPHAGEAL ECHOCARDIOGRAM (TEE) (N/A)  Hemodynamically stable in sinus rhythm. Monitor strip reviewed  and he has remained sinus with PAC's but no heart block. Can resume Lopressor at discharge.  2D echo this am.  Slight bump in creat in preop but it was higher earlier this month and it should be fine.  Ambulate this am and plan home later today with his brother.   LOS: 1 day    Alleen Borne 09/02/2020

## 2020-09-02 NOTE — Progress Notes (Signed)
Pt being discharged home with son. AVS reviewed with pt and all questions answered. IV and telemetry box removed.

## 2020-09-02 NOTE — Progress Notes (Signed)
Echocardiogram 2D Echocardiogram has been performed.  Lawrence Rose F Vence Lalor 09/02/2020, 10:32 AM 

## 2020-09-02 NOTE — Progress Notes (Signed)
CARDIAC REHAB PHASE I   PRE:  Rate/Rhythm: 99 SR with PVCs  BP:  Sitting: 133/57      SaO2: 94 RA  MODE:  Ambulation: 200 ft   POST:  Rate/Rhythm: 133 ST with PVS  BP:  Sitting: 110/54    SaO2: 95 RA   Pt ambulated 278ft in hallway assist of one with front wheel walker. Pt states breathing is much improved, mobility limited due to knee pain. Pt returned to recliner. Call bell and bedside table within reach. Encouraged continued ambulation with emphasis on safety. Reviewed site care and restrictions. Pt declining CRP II at this time due to knee limitations.  [id number] Reynold Bowen, RN BSN 09/02/2020 10:16 AM

## 2020-09-02 NOTE — Discharge Summary (Addendum)
[place] VALVE TEAM  Discharge Summary    Patient ID: Deborah Dondero MRN: [id number]; DOB: 1955/12/26  Admit date: 09/01/2020 Discharge date: 09/02/2020  Primary Care Provider: [name], [name], MD  Primary Cardiologist: Werner Lean, MD / Dr. Buena Irish & Dr. Cyndia Bent (TAVR)  Discharge Diagnoses    Principal Problem:   S/P TAVR (transcatheter aortic valve replacement) Active Problems:   CKD (chronic kidney disease) stage 3, GFR 30-59 ml/min (HCC)   OSA on CPAP   Obesity, Class III, BMI 40-49.9 (morbid obesity) ([place])   Severe aortic stenosis   Nonrheumatic mitral valve stenosis   Anemia   Pulmonary hypertension, unspecified (HCC)   Allergies Allergies  Allergen Reactions  . Cocoa Hives  . Sulfamethoxazole-Trimethoprim Anaphylaxis  . Chocolate Hives  . Citrus Hives    Reaction to oranges and orange juice  . Fish Allergy Hives and Swelling    Throat swelling  . Shellfish Allergy Hives and Swelling    Throat swelling  . Sulfa Antibiotics Hives and Swelling    Throat swelling  . Tetracycline Swelling    Diagnostic Studies/Procedures    TAVR OPERATIVE NOTE  Edwin Baines [id number]  Date of Procedure: 09/01/2020  Preoperative Diagnosis:Severe Aortic Stenosis   Postoperative Diagnosis:Same   Procedure:   Transcatheter Aortic Valve Replacement - Percutaneous RightTransfemoral Approach Medtronic Evolut-Pro +(size 18m,  serial # [id number]  Co-Surgeons:Bryan KAlveria Apley MD and CLauree Chandler MD   Anesthesiologist:Robert FOla Spurr MD  EDala Dock MD  Pre-operative Echo Findings: ? Severe aortic stenosis and moderate AI ? Normalleft ventricular systolic function  Post-operative Echo Findings: ? Noparavalvular leak ? Normalleft ventricular systolic  function  _____________   Echo 09/02/20: complete but pending formal read at the time of discharge    History of Present Illness     JConnell Bognaris a 64y.o. male with a history of HTN, HLD, anemia,morbid obesity,chronic back and knee pain with limited mobility,OSAon CPAP, CKD stage III, pulmonary HTN, sick euthyroid syndrome, mild MS and severe aortic stenosis who [facility] planned [facility]R.   The patient was recently admitted for acute heart failure requiring IV diuresis. Transthoracic echocardiogram showed EF 60 to 65%, moderate concentric LVH, indeterminate diastolic function, severe left atrial dilation, mild mitral stenosis (mean gradient of 6 mm Hg at HR of 101bpm), severe aortic valve stenosis with an AVA 0.9 cm, mean gradient 51 mmHg, peak gradient 82 mmHg, V-max 4.65 cm, DVI 0.2. He underwent left and right heart catheterization on 08/18/2020 which showed widely patent coronary arteries, high cardiac output at 9.06 L/min by Fick and 9.23 L/min by thermal dilution, severe pulmonary hypertension with a mean pressure of 48 mmHg, pulmonary capillary wedge pressure mean 34 mmHg, pulmonary vascular resistance 1.55 Woods units, mean right atrial pressure 19 mmHg, aortic valve peak gradient of 33 mmHg, mean gradient 22 mmHg, aortic valve area 1.94 cm by Fick and 1.98 cm by thermal dilution, LVEDP 20 mmHg. Further diuresis was recommended.  There was some question of his high cardiac output being related to possible thyrotoxicosis as he was noted to have a suppressed TSH with a normal T4. He underwent full TAVR work-up during his admission and felt to be a good TAVR candidate at that time. However, it was recommended that this be delayed given acute kidney injury secondary to diuresis and contrast dye studies  The patient has been evaluated by the multidisciplinary valve team and felt to have severe, symptomatic aortic stenosis  and to be a suitable candidate for TAVR,  which was set up for 09/01/20.    Hospital Course     Consultants: none  Severe AS: s/p successful TAVR with a 29 mm Medtronic Evolut Pro+ THV via the TF approach on 09/01/20. Post operative echo completed but pending formal read. Groin sites are stable. ECG with sinus with PACs. There is no high grade heart block. Continue Asprin and started on plavix. Plan for discharge home today with close follow up in the office in 2 weeks.   AKI: creat mildly elevated but with in previous ranges (0.9--> 1.34). Will hold lasix and lisinopril today and restart tomorrow at home. Will check a Bmet at follow up  DMT2: treated with SSI while admitted. Resume home meds at discharge. Okay to resume Metformin after 48 hours after contrast dye exposure (11/18PM)   Chronic anemia: Hg stable at 9.4   Liver lesion: pre TAVR scan showed 1.3 cm indeterminate hypovascular lesion in segment 5 of the liver. Correlation with nonemergent abdominal MRI with and without IV gadolinium is recommended in the near future to definitively characterize this lesion and exclude neoplasm. Will discuss this in the outpatient setting.   _____________  Discharge Vitals Blood pressure 132/63, pulse 89, temperature 98.9 F (37.2 C), temperature source Oral, resp. rate 16, height 5' 11" (1.803 m), weight (!) 139.9 kg, SpO2 98 %.  Filed Weights   09/01/20 0729 09/02/20 0452  Weight: (!) 139.3 kg (!) 139.9 kg    Labs & Radiologic Studies    CBC Recent Labs    09/01/20 1234 09/02/20 0152  WBC  --  7.2  HGB 9.9* 9.4*  HCT 29.0* 32.7*  MCV  --  84.7  PLT  --  559   Basic Metabolic Panel Recent Labs    09/01/20 1234 09/02/20 0152  NA 140 138  K 4.0 4.2  CL 104 102  CO2  --  28  GLUCOSE 144* 156*  BUN 18 19  CREATININE 0.90 1.34*  CALCIUM  --  8.6*  MG  --  2.0   Liver Function Tests No results for input(s): AST, ALT, ALKPHOS, BILITOT, PROT, ALBUMIN in the last 72 hours. No results for input(s): LIPASE, AMYLASE in  the last 72 hours. Cardiac Enzymes No results for input(s): CKTOTAL, CKMB, CKMBINDEX, TROPONINI in the last 72 hours. BNP Invalid input(s): POCBNP D-Dimer No results for input(s): DDIMER in the last 72 hours. Hemoglobin A1C No results for input(s): HGBA1C in the last 72 hours. Fasting Lipid Panel No results for input(s): CHOL, HDL, LDLCALC, TRIG, CHOLHDL, LDLDIRECT in the last 72 hours. Thyroid Function Tests No results for input(s): TSH, T4TOTAL, T3FREE, THYROIDAB in the last 72 hours.  Invalid input(s): FREET3 _____________  DG Chest 2 View  Result Date: 08/28/2020 CLINICAL DATA:  Severe aortic stenosis. EXAM: CHEST - 2 VIEW COMPARISON:  CT angiogram chest 08/19/2020. Chest radiograph 08/15/2020. FINDINGS: Cardiomegaly. Aortic atherosclerosis. Redemonstrated prominent epicardial fat. Central pulmonary vascular congestion without overt pulmonary edema. No appreciable airspace consolidation. No evidence of pleural effusion or pneumothorax. No acute bony abnormality identified. IMPRESSION: Unchanged cardiomegaly. Central pulmonary vascular congestion without overt pulmonary edema. Aortic Atherosclerosis (ICD10-I70.0). Electronically Signed   By: Kellie Simmering DO   On: 08/28/2020 09:52   CT Angio Chest PE W and/or Wo Contrast  Result Date: 08/15/2020 CLINICAL DATA:  Shortness of breath EXAM: CT ANGIOGRAPHY CHEST WITH CONTRAST TECHNIQUE: Multidetector CT imaging of the chest was performed using the standard protocol during bolus  administration of intravenous contrast. Multiplanar CT image reconstructions and MIPs were obtained to evaluate the vascular anatomy. CONTRAST:  114m OMNIPAQUE IOHEXOL 350 MG/ML SOLN COMPARISON:  None. FINDINGS: Cardiovascular: No filling defects in the pulmonary arteries to suggest pulmonary emboli. Aortic atherosclerosis. Calcifications of the aortic valve. Heart is normal size. Mediastinum/Nodes: Scattered small mediastinal lymph nodes, none pathologically enlarged.  Trachea and esophagus are unremarkable. Thyroid unremarkable. Lungs/Pleura: Small bilateral pleural effusions. Compressive atelectasis in the lower lobes. Patchy bilateral ground-glass airspace opacities most notable in the upper lobes concerning for pneumonia. Upper Abdomen: Imaging into the upper abdomen demonstrates no acute findings. Musculoskeletal: Chest wall soft tissues are unremarkable. No acute bony abnormality. Review of the MIP images confirms the above findings. IMPRESSION: No evidence of pulmonary embolus. Small bilateral pleural effusions. Compressive atelectasis in the lower lobes. Patchy bilateral ground-glass opacities in the upper lobes concerning for pneumonia. Aortic Atherosclerosis (ICD10-I70.0). Electronically Signed   By: KRolm BaptiseM.D.   On: 08/15/2020 19:54   CARDIAC CATHETERIZATION  Result Date: 08/18/2020  Widely patent coronary arteries  High cardiac output: 9.06 L/min by Fick and 9.23 L/min by thermal dilution.  Etiology uncertain.  Suppressed TSH with normal T4 raising question of T3 thyrotoxicosis versus other mechanism.  Severe pulmonary hypertension, mean pressure 48 mmHg, based upon hemodynamics WHO group 2  Pulmonary capillary wedge pressure mean 34 mmHg, pulmonary vascular resistance 1.55 Woods units.  Mean right atrial pressure 19 mmHg  Aortic valve gradient peak to peak 33 mmHg with mean gradient 22 mmHg.  Aortic valve area 1.94 cm by Fick cardiac output and 1.98 cm by thermodilution  LV function normal by echocardiography with moderate LVH RECOMMENDATIONS:  Further work-up/management per treating team.  Needs further diuresis.  T3 level  CT CORONARY MORPH W/CTA COR W/SCORE W/CA W/CM &/OR WO/CM  Addendum Date: 08/20/2020   ADDENDUM REPORT: 08/20/2020 08:01 EXAM: OVER-READ INTERPRETATION  CT CHEST The following report is an over-read performed by radiologist Dr. [facility]bekah [facility] on 08/20/2020. This over-read does not include  interpretation of cardiac or coronary anatomy or pathology. The coronary calcium score and cardiac CTA interpretation by the cardiologist is attached. COMPARISON:  Chest CTA 08/15/2020. FINDINGS: Extracardiac findings will be described separately under dictation for contemporaneously obtained CTA chest, abdomen and pelvis. IMPRESSION: Please see separate dictation for contemporaneously obtained CTA chest, abdomen and pelvis 08/19/2020 for full description of relevant extracardiac findings. Electronically Signed   By: DVinnie LangtonM.D.   On: 08/20/2020 08:01   Result Date: 08/20/2020 MEDICATIONS: MEDICATIONS None EXAM: Cardiac TAVR CT TECHNIQUE: The patient was scanned on a Siemens Force 1696slice scanner. A 120 kV retrospective scan was triggered in the descending thoracic aorta at 111 HU's. Gantry rotation speed was 270 msecs and collimation was .9 mm. No beta blockade or nitro were given. The 3D data set was reconstructed in 5% intervals of the R-R cycle. Systolic and diastolic phases were analyzed on a dedicated work station using MPR, MIP and VRT modes. The patient received 80 cc of contrast. FINDINGS: Aortic Valve: Calcium score 1645 Tri leaflet with restricted motion Aorta: No aneurysm moderate calcific atherosclerosis normal arch vessels Sino-tubular Junction: 25 mm Ascending Thoracic Aorta: 33 mm Aortic Arch: 25 mm Descending Thoracic Aorta: 25 mm Sinus of Valsalva Measurements: Non-coronary: 30.8 mm Right - coronary: 28.2 mm Left - coronary: 31.3 mm Coronary Artery Height above Annulus: Left Main: 11.1 mm above annulus Right Coronary: 15.3 mm above annulus Virtual Basal Annulus Measurements: Maximum/Minimum Diameter:  27.4 mm x 22.8 mm Perimeter: 82 mm Area: 505 mm2 Coronary Arteries: Sufficient height above annulus for deployment Optimum Fluoroscopic Angle for Delivery: LAO 3 Caudal 6 degrees IMPRESSION: 1. Tri-leaflet AV with calcium score 1645 and annular area of 505 mm2 suitable for a 26 mm Sapien  3 valve 2.  Coronary arteries sufficient height above annulus for deployment 3.  Optimum angiographic angle for deployment LAO 3 Caudal 6 degrees 4. Some nodular calcification in annulus at base of left coronary cusp 5.  Normal aortic root 3.3 cm Jenkins Rouge Electronically Signed: By: Jenkins Rouge M.D. On: 08/19/2020 18:00   DG Chest Port 1 View  Result Date: 09/01/2020 CLINICAL DATA:  64 year old male status post TAVR postoperative day zero. EXAM: PORTABLE CHEST 1 VIEW COMPARISON:  Chest radiographs 08/27/2020 and earlier. FINDINGS: Portable AP semi upright view at 1342 hours. Stable lung volumes and mediastinal contours. Only faintly visible TAVR hardware on this image. Allowing for portable technique the lungs are clear. Visualized tracheal air column is within normal limits. No acute osseous abnormality identified. IMPRESSION: No acute cardiopulmonary abnormality.  Faintly visible TAVR. Electronically Signed   By: Genevie Ann M.D.   On: 09/01/2020 13:58   DG Chest Port 1 View  Result Date: 08/15/2020 CLINICAL DATA:  Shortness of breath with exertion for 1 week. Dry cough. O2 sats 87%. EXAM: PORTABLE CHEST 1 VIEW COMPARISON:  06/08/2014 FINDINGS: Heart size is accentuated by portable technique. There are faint opacities at the lung bases bilaterally, consistent with early infiltrates. No pulmonary edema. IMPRESSION: Suspect bibasilar infiltrates. Electronically Signed   By: Nolon Nations M.D.   On: 08/15/2020 17:44   CT ANGIO CHEST AORTA W/CM & OR WO/CM  Result Date: 08/20/2020 CLINICAL DATA:  64 year old male with history of severe aortic stenosis. Preprocedural study prior to potential transcatheter aortic valve replacement (TAVR) procedure. EXAM: CT ANGIOGRAPHY CHEST, ABDOMEN AND PELVIS TECHNIQUE: Non-contrast CT of the chest was initially obtained. Multidetector CT imaging through the chest, abdomen and pelvis was performed using the standard protocol during bolus administration of intravenous  contrast. Multiplanar reconstructed images and MIPs were obtained and reviewed to evaluate the vascular anatomy. CONTRAST:  159m OMNIPAQUE IOHEXOL 350 MG/ML SOLN COMPARISON:  None. FINDINGS: CTA CHEST FINDINGS Cardiovascular: Heart size is mildly enlarged. There is no significant pericardial fluid, thickening or pericardial calcification. There is aortic atherosclerosis, as well as atherosclerosis of the great vessels of the mediastinum and the coronary arteries, including calcified atherosclerotic plaque in the left anterior descending, left circumflex and right coronary arteries. Severe thickening calcification of the aortic valve. Mild calcifications of the mitral annulus. Mediastinum/Nodes: No pathologically enlarged mediastinal or hilar lymph nodes. Esophagus is unremarkable in appearance. No axillary lymphadenopathy. Lungs/Pleura: Patchy areas of ground-glass attenuation and mild interlobular septal thickening noted throughout the lungs bilaterally, favored to reflect a background of mild interstitial pulmonary edema. No confluent consolidative airspace disease. Moderate right and small left pleural effusions lying dependently with some associated passive subsegmental atelectasis in the lower lobes of the lungs bilaterally. Mild scarring in the medial aspect of the right upper lobe and inferior segment of the lingula. Musculoskeletal: There are no aggressive appearing lytic or blastic lesions noted in the visualized portions of the skeleton. CTA ABDOMEN AND PELVIS FINDINGS Hepatobiliary: Diffuse low attenuation throughout the hepatic parenchyma, indicative of a background of hepatic steatosis. In the inferior aspect of segment 5 of the liver (axial image 134 of series 4) there is a small hypovascular lesion measuring 1.3 cm  in diameter (44 HU), which is indeterminate. No other larger more suspicious appearing hepatic lesions. No intra or extrahepatic biliary ductal dilatation. Gallbladder is normal in  appearance. Pancreas: No pancreatic mass. No pancreatic ductal dilatation. No pancreatic or peripancreatic fluid collections or inflammatory changes. Spleen: Unremarkable. Adrenals/Urinary Tract: Subcentimeter low-attenuation lesions in the right kidney, too small to characterize, but statistically likely to represent cysts. Left kidney and bilateral adrenal glands are normal in appearance. No hydroureteronephrosis. Urinary bladder is normal in appearance. Stomach/Bowel: The appearance of the stomach is normal. No pathologic dilatation of small bowel or colon. Normal appendix. Vascular/Lymphatic: Aortic atherosclerosis, with vascular findings and measurements pertinent to potential TAVR procedure, as detailed below. No aneurysm or dissection noted in the abdominal or pelvic vasculature. No lymphadenopathy noted in the abdomen or pelvis. Reproductive: Prostate gland and seminal vesicles are unremarkable in appearance. Other: Moderate-sized umbilical hernia containing only omental fat. Left inguinal hernia containing predominantly fat. No significant volume of ascites. No pneumoperitoneum. Musculoskeletal: There are no aggressive appearing lytic or blastic lesions noted in the visualized portions of the skeleton. VASCULAR MEASUREMENTS PERTINENT TO TAVR: AORTA: Minimal Aortic Diameter-13 x 12 mm Severity of Aortic Calcification-moderate to severe RIGHT PELVIS: Right Common Iliac Artery - Minimal Diameter-7.3 x 7.8 mm Tortuosity - mild Calcification-moderate Right External Iliac Artery - Minimal Diameter-8.1 x 4.2 mm Tortuosity - mild Calcification-mild Right Common Femoral Artery - Minimal Diameter-7.9 x 7.6 mm Tortuosity - mild Calcification-mild LEFT PELVIS: Left Common Iliac Artery - Minimal Diameter-7.4 x 7.8 mm Tortuosity - mild Calcification-moderate Left External Iliac Artery - Minimal Diameter-7.7 x 5.8 mm Tortuosity - mild Calcification-none Left Common Femoral Artery - Minimal Diameter-8.2 x 8.2 mm  Tortuosity-mild Calcification-minimal Review of the MIP images confirms the above findings. IMPRESSION: 1. Vascular findings and measurements pertinent to potential TAVR procedure, as detailed above. 2. Severe thickening calcification of the aortic valve, compatible with the reported clinical history of severe aortic stenosis. 3. Aortic atherosclerosis, in addition to three vessel coronary artery disease. Please note that although the presence of coronary artery calcium documents the presence of coronary artery disease, the severity of this disease and any potential stenosis cannot be assessed on this non-gated CT examination. Assessment for potential risk factor modification, dietary therapy or pharmacologic therapy may be warranted, if clinically indicated. 4. Cardiomegaly with evidence of interstitial pulmonary edema and bilateral pleural effusions (right greater than left); imaging findings suggestive of congestive heart failure. 5. Hepatic steatosis. 6. 1.3 cm indeterminate hypovascular lesion in segment 5 of the liver. Correlation with nonemergent abdominal MRI with and without IV gadolinium is recommended in the near future to definitively characterize this lesion and exclude neoplasm. 7. Additional incidental findings, as above. Electronically Signed   By: Vinnie Langton M.D.   On: 08/20/2020 08:53   ECHOCARDIOGRAM COMPLETE  Result Date: 08/17/2020    ECHOCARDIOGRAM REPORT   Patient Name:   MATHEUS SPIKER Date of Exam: 08/17/2020 Medical Rec #:  [id number]      Height:       71.0 in Accession #:    [id number]     Weight:       310.3 lb Date of Birth:  09/22/1956      BSA:          2.541 m Patient Age:    77 years       BP:           138/83 mmHg Patient Gender: M  HR:           97 bpm. Exam Location:  Inpatient Procedure: 2D Echo, Color Doppler and Cardiac Doppler Indications:    NSTEMI  History:        Patient has prior history of Echocardiogram examinations, most                 recent  08/10/2018. Risk Factors:Hypertension and Diabetes. Prior                 performed at [facility]:    [place] Referring Phys: [id number] [employer]  1. Left ventricular ejection fraction, by estimation, is 60 to 65%. The left ventricle has normal function. The left ventricle has no regional wall motion abnormalities. There is moderate concentric left ventricular hypertrophy. Left ventricular diastolic parameters are indeterminate.  2. Right ventricular systolic function is normal. The right ventricular size is normal.  3. Left atrial size was severely dilated.  4. The mitral valve is normal in structure. Trivial mitral valve regurgitation. The posterior leaflet appears to have limited mobility with mild flow acceleration noted across the mitral valve with mean gradient 71mHg at HR 101. There is mild narrowing of the mitral valve orfice without significant stenosis.  5. The aortic valve is tricuspid. There is severe calcifcation of the aortic valve. There is severe thickening of the aortic valve. Aortic valve regurgitation is not visualized. Severe aortic valve stenosis with AVA 0.9 (by continuity), mean gradient 572mg, peak gradient 8276m, Vmax 4.65cm2, DOI 0.2.  6. The inferior vena cava is dilated in size with >50% respiratory variability, suggesting right atrial pressure of 8 mmHg. FINDINGS  Left Ventricle: Left ventricular ejection fraction, by estimation, is 60 to 65%. The left ventricle has normal function. The left ventricle has no regional wall motion abnormalities. The left ventricular internal cavity size was normal in size. There is  moderate concentric left ventricular hypertrophy. Left ventricular diastolic parameters are indeterminate. Right Ventricle: The right ventricular size is normal. Right vetricular wall thickness was not well visualized. Right ventricular systolic function is normal. Left Atrium: Left atrial size was severely dilated. Right Atrium: Right  atrial size was normal in size. Pericardium: There is no evidence of pericardial effusion. Mitral Valve: The mitral valve is normal in structure. There is mild thickening of the mitral valve leaflet(s). Trivial mitral valve regurgitation. The posterior leaflet appears to have limited mobility with mild flow acceleration noted across the mitral  valve with mean gradient 6mm66mat HR 101. There is mild narrowing fo the mitral valve orfice without significant stenosis. MV peak gradient, 10.1 mmHg. The mean mitral valve gradient is 6.0 mmHg. Tricuspid Valve: The tricuspid valve is normal in structure. Tricuspid valve regurgitation is trivial. Aortic Valve: The aortic valve is tricuspid. There is severe calcifcation of the aortic valve. There is severe thickening of the aortic valve. Aortic valve regurgitation is not visualized. Severe aortic stenosis is present. Aortic valve mean gradient measures 51.0 mmHg. Aortic valve peak gradient measures 83.2 mmHg. Aortic valve area, by VTI measures 0.89 cm. DOI 0.2. Pulmonic Valve: The pulmonic valve was normal in structure. Pulmonic valve regurgitation is trivial. Aorta: The aortic root and ascending aorta are structurally normal, with no evidence of dilitation. Venous: The inferior vena cava is dilated in size with greater than 50% respiratory variability, suggesting right atrial pressure of 8 mmHg. IAS/Shunts: No atrial level shunt detected by color flow Doppler.  LEFT VENTRICLE PLAX 2D LVIDd:  4.29 cm  Diastology LVIDs:         2.46 cm  LV e' medial:  8.05 cm/s LV PW:         1.61 cm  LV e' lateral: 10.40 cm/s LV IVS:        1.24 cm LVOT diam:     2.40 cm LV SV:         90 LV SV Index:   36 LVOT Area:     4.52 cm  RIGHT VENTRICLE RV S prime:     10.30 cm/s TAPSE (M-mode): 2.0 cm LEFT ATRIUM              Index       RIGHT ATRIUM           Index LA diam:        3.70 cm  1.46 cm/m  RA Area:     23.50 cm LA Vol (A2C):   108.0 ml 42.50 ml/m RA Volume:   75.60 ml  29.75  ml/m LA Vol (A4C):   123.0 ml 48.40 ml/m LA Biplane Vol: 120.0 ml 47.22 ml/m  AORTIC VALVE AV Area (Vmax):    0.97 cm AV Area (Vmean):   1.18 cm AV Area (VTI):     0.89 cm AV Vmax:           456.00 cm/s AV Vmean:          268.000 cm/s AV VTI:            1.017 m AV Peak Grad:      83.2 mmHg AV Mean Grad:      51.0 mmHg LVOT Vmax:         97.50 cm/s LVOT Vmean:        70.000 cm/s LVOT VTI:          0.200 m LVOT/AV VTI ratio: 0.20  AORTA Ao Root diam: 3.20 cm Ao Asc diam:  3.40 cm MITRAL VALVE MV Peak grad: 10.1 mmHg  SHUNTS MV Mean grad: 6.0 mmHg   Systemic VTI:  0.20 m MV Vmax:      1.59 m/s   Systemic Diam: 2.40 cm MV Vmean:     117.0 cm/s Gwyndolyn Kaufman MD Electronically signed by Gwyndolyn Kaufman MD Signature Date/Time: 08/17/2020/12:41:56 PM    Final    ECHO TEE  Result Date: 08/21/2020    TRANSESOPHOGEAL ECHO REPORT   Patient Name:   DIANE MOCHIZUKI Date of Exam: 08/21/2020 Medical Rec #:  [id number]      Height:       71.0 in Accession #:    [id number]     Weight:       303.2 lb Date of Birth:  11-01-1955      BSA:          2.516 m Patient Age:    32 years       BP:           149/75 mmHg Patient Gender: M              HR:           87 bpm. Exam Location:  Inpatient Procedure: Transesophageal Echo, Cardiac Doppler and Color Doppler Indications:     I35.0 Nonrheumatic aortic (valve) stenosis  History:         Patient has prior history of Echocardiogram examinations, most                  recent 08/17/2020. Previous Myocardial Infarction  and CAD,                  Aortic Valve Disease; Risk Factors:Sleep Apnea. Severe aortic                  stenosis.  Sonographer:     Roseanna Rainbow RDCS Referring Phys:  [id number] Woodfin Ganja Attilio Zeitler Diagnosing Phys: Kirk Ruths MD PROCEDURE: After discussion of the risks and benefits of a TEE, an informed consent was obtained from the patient. The transesophogeal probe was passed without difficulty through the esophogus of the patient. Imaged were obtained with the patient in  a left lateral decubitus position. Sedation performed by different physician. The patient was monitored while under deep sedation. Anesthestetic sedation was provided intravenously by Anesthesiology: 37m of Propofol. The patient's vital signs; including heart rate, blood pressure, and oxygen saturation; remained stable throughout the procedure. The patient developed no complications during the procedure. IMPRESSIONS  1. Severe AS (mean gradient 42 mmHg and peak velocity of 4.2 m/s); mild AI.  2. Left ventricular ejection fraction, by estimation, is 70 to 75%. The left ventricle has hyperdynamic function. There is severe left ventricular hypertrophy.  3. Right ventricular systolic function is normal. The right ventricular size is normal.  4. Left atrial size was moderately dilated. No left atrial/left atrial appendage thrombus was detected.  5. The mitral valve is normal in structure. Trivial mitral valve regurgitation.  6. The aortic valve is tricuspid. Aortic valve regurgitation is mild. Severe aortic valve stenosis.  7. There is Moderate (Grade III) plaque involving the descending aorta. FINDINGS  Left Ventricle: Left ventricular ejection fraction, by estimation, is 70 to 75%. The left ventricle has hyperdynamic function. The left ventricular internal cavity size was normal in size. There is severe left ventricular hypertrophy. Right Ventricle: The right ventricular size is normal. Right vetricular wall thickness was not assessed. Right ventricular systolic function is normal. Left Atrium: Left atrial size was moderately dilated. No left atrial/left atrial appendage thrombus was detected. Right Atrium: Right atrial size was normal in size. Pericardium: There is no evidence of pericardial effusion. Mitral Valve: The mitral valve is normal in structure. Mild mitral annular calcification. Trivial mitral valve regurgitation. MV peak gradient, 5.8 mmHg. The mean mitral valve gradient is 2.0 mmHg. Tricuspid Valve: The  tricuspid valve is normal in structure. Tricuspid valve regurgitation is mild. Aortic Valve: The aortic valve is tricuspid. Aortic valve regurgitation is mild. Severe aortic stenosis is present. Aortic valve mean gradient measures 41.5 mmHg. Aortic valve peak gradient measures 66.7 mmHg. Aortic valve area, by VTI measures 1.04 cm. Pulmonic Valve: The pulmonic valve was normal in structure. Pulmonic valve regurgitation is not visualized. Aorta: The aortic root is normal in size and structure. There is moderate (Grade III) plaque involving the descending aorta. IAS/Shunts: No atrial level shunt detected by color flow Doppler. Additional Comments: Severe AS (mean gradient 42 mmHg and peak velocity of 4.2 m/s); mild AI.  LEFT VENTRICLE PLAX 2D LVOT diam:     2.20 cm LV SV:         81 LV SV Index:   32 LVOT Area:     3.80 cm  AORTIC VALVE AV Area (Vmax):    1.01 cm AV Area (Vmean):   0.92 cm AV Area (VTI):     1.04 cm AV Vmax:           408.50 cm/s AV Vmean:          307.500 cm/s AV VTI:  0.778 m AV Peak Grad:      66.7 mmHg AV Mean Grad:      41.5 mmHg LVOT Vmax:         108.00 cm/s LVOT Vmean:        74.800 cm/s LVOT VTI:          0.214 m LVOT/AV VTI ratio: 0.27 MITRAL VALVE MV Peak grad: 5.8 mmHg  SHUNTS MV Mean grad: 2.0 mmHg  Systemic VTI:  0.21 m MV Vmax:      1.20 m/s  Systemic Diam: 2.20 cm MV Vmean:     60.7 cm/s Kirk Ruths MD Electronically signed by Kirk Ruths MD Signature Date/Time: 08/21/2020/9:21:38 AM    Final    VAS US DOPPLER PRE CABG  Result Date: 08/19/2020 PREOPERATIVE VASCULAR EVALUATION  Indications:  Pre-CABG. Risk Factors: Hypertension, Diabetes. Performing Technologist: Griffin Basil RCT RDMS  Examination Guidelines: A complete evaluation includes B-mode imaging, spectral Doppler, color Doppler, and power Doppler as needed of all accessible portions of each vessel. Bilateral testing is considered an integral part of a complete examination. Limited examinations for  reoccurring indications may be performed as noted.  Right Carotid Findings: +----------+--------+--------+--------+--------+------------------+           PSV cm/sEDV cm/sStenosisDescribeComments           +----------+--------+--------+--------+--------+------------------+ CCA Prox  97      20                                         +----------+--------+--------+--------+--------+------------------+ CCA Distal104     27                      intimal thickening +----------+--------+--------+--------+--------+------------------+ ICA Prox  118     36      1-39%           intimal thickening +----------+--------+--------+--------+--------+------------------+ ICA Distal132     39                      intimal thickening +----------+--------+--------+--------+--------+------------------+ ECA       90      5                                          +----------+--------+--------+--------+--------+------------------+ Portions of this table do not appear on this page. +----------+--------+-------+--------+------------+           PSV cm/sEDV cmsDescribeArm Pressure +----------+--------+-------+--------+------------+ Subclavian188     4                           +----------+--------+-------+--------+------------+ +---------+--------+--+--------+--+---------+ VertebralPSV cm/s93EDV cm/s29Antegrade +---------+--------+--+--------+--+---------+ Left Carotid Findings: +----------+--------+--------+--------+--------+------------------+           PSV cm/sEDV cm/sStenosisDescribeComments           +----------+--------+--------+--------+--------+------------------+ CCA Prox  112     29                                         +----------+--------+--------+--------+--------+------------------+ CCA Distal105     23                      intimal thickening +----------+--------+--------+--------+--------+------------------+ ICA Prox  127  39                       intimal thickening +----------+--------+--------+--------+--------+------------------+ ICA Distal106     41                                         +----------+--------+--------+--------+--------+------------------+ ECA       102     13                                         +----------+--------+--------+--------+--------+------------------+ +----------+--------+--------+--------+------------+ SubclavianPSV cm/sEDV cm/sDescribeArm Pressure +----------+--------+--------+--------+------------+           212     7                            +----------+--------+--------+--------+------------+ +---------+--------+--+--------+--+---------+ VertebralPSV cm/s82EDV cm/s24Antegrade +---------+--------+--+--------+--+---------+  ABI Findings: +--------+------------------+-----+---------+--------+ Right   Rt Pressure (mmHg)IndexWaveform Comment  +--------+------------------+-----+---------+--------+ [id number]                    triphasic         +--------+------------------+-----+---------+--------+ +--------+------------------+-----+---------+-------+ Left    Lt Pressure (mmHg)IndexWaveform Comment +--------+------------------+-----+---------+-------+ Brachial120                    triphasic        +--------+------------------+-----+---------+-------+  Right Doppler Findings: +--------+--------+-----+---------+--------+ Site    PressureIndexDoppler  Comments +--------+--------+-----+---------+--------+ [id number]          triphasic         +--------+--------+-----+---------+--------+ Radial               triphasic         +--------+--------+-----+---------+--------+ Ulnar                triphasic         +--------+--------+-----+---------+--------+  Left Doppler Findings: +--------+--------+-----+---------+--------+ Site    PressureIndexDoppler  Comments +--------+--------+-----+---------+--------+ [id number]           triphasic         +--------+--------+-----+---------+--------+ Radial               triphasic         +--------+--------+-----+---------+--------+ Ulnar                triphasic         +--------+--------+-----+---------+--------+  Summary: Right Carotid: Velocities in the right ICA are consistent with a 1-39% stenosis.                The ECA appears <50% stenosed. Left Carotid: Velocities in the left ICA are consistent with a 1-39% stenosis.               The ECA appears <50% stenosed. Vertebrals: Bilateral vertebral arteries demonstrate antegrade flow. Right Upper Extremity: Doppler waveforms remain within normal limits with right radial compression. Doppler waveforms remain within normal limits with right ulnar compression. Left Upper Extremity: Doppler waveforms decrease 50% with left radial compression. Doppler waveform obliterate with left ulnar compression.  Electronically signed by Curt Jews MD on 08/19/2020 at 3:05:41 PM.    Final    VAS [place] LOWER EXTREMITY VENOUS (DVT)  Result Date: 08/17/2020  Lower Venous DVTStudy Indications: Swelling.  Limitations: Body habitus and poor ultrasound/tissue interface. Comparison Study: No prior study Performing Technologist: Sharyn Lull  Simonetti MHA, RDMS, RVT, RDCS  Examination Guidelines: A complete evaluation includes B-mode imaging, spectral Doppler, color Doppler, and power Doppler as needed of all accessible portions of each vessel. Bilateral testing is considered an integral part of a complete examination. Limited examinations for reoccurring indications may be performed as noted. The reflux portion of the exam is performed with the patient in reverse Trendelenburg.  +---------+---------------+---------+-----------+----------+--------------+ RIGHT    CompressibilityPhasicitySpontaneityPropertiesThrombus Aging +---------+---------------+---------+-----------+----------+--------------+ CFV      Full           Yes      Yes                                  +---------+---------------+---------+-----------+----------+--------------+ SFJ      Full                                                        +---------+---------------+---------+-----------+----------+--------------+ FV Prox  Full                                                        +---------+---------------+---------+-----------+----------+--------------+ FV Mid   Full                                                        +---------+---------------+---------+-----------+----------+--------------+ FV DistalFull                                                        +---------+---------------+---------+-----------+----------+--------------+ PFV      Full                                                        +---------+---------------+---------+-----------+----------+--------------+ POP      Full           Yes      Yes                                 +---------+---------------+---------+-----------+----------+--------------+ PTV      Full                                                        +---------+---------------+---------+-----------+----------+--------------+ PERO     Full                                                        +---------+---------------+---------+-----------+----------+--------------+   +---------+---------------+---------+-----------+----------+--------------+  LEFT     CompressibilityPhasicitySpontaneityPropertiesThrombus Aging +---------+---------------+---------+-----------+----------+--------------+ CFV      Full           Yes      Yes                                 +---------+---------------+---------+-----------+----------+--------------+ SFJ      Full                                                        +---------+---------------+---------+-----------+----------+--------------+ FV Prox  Full                                                         +---------+---------------+---------+-----------+----------+--------------+ FV Mid   Full                                                        +---------+---------------+---------+-----------+----------+--------------+ FV DistalFull                                                        +---------+---------------+---------+-----------+----------+--------------+ PFV      Full                                                        +---------+---------------+---------+-----------+----------+--------------+ POP      Full           Yes      Yes                                 +---------+---------------+---------+-----------+----------+--------------+ PTV      Full                                                        +---------+---------------+---------+-----------+----------+--------------+ PERO     Full                                                        +---------+---------------+---------+-----------+----------+--------------+     Summary: RIGHT: - There is no evidence of deep vein thrombosis in the lower extremity.  - No cystic structure found in the popliteal fossa.  LEFT: - There is no evidence of deep vein thrombosis in the lower extremity.  - No  cystic structure found in the popliteal fossa.  *See table(s) above for measurements and observations. Electronically signed by Ruta Hinds MD on 08/17/2020 at 7:11:02 PM.    Final    ECHOCARDIOGRAM LIMITED  Result Date: 09/01/2020    ECHOCARDIOGRAM LIMITED REPORT   Patient Name:   IMRE VECCHIONE Date of Exam: 09/01/2020 Medical Rec #:  [id number]      Height:       71.0 in Accession #:    [id number]     Weight:       307.4 lb Date of Birth:  1956/03/17      BSA:          2.531 m Patient Age:    56 years       BP:           151/71 mmHg Patient Gender: M              HR:           81 bpm. Exam Location:  Inpatient Procedure: Limited Echo, Cardiac Doppler and Color Doppler Indications:     I35.0 Nonrheumatic aortic  (valve) stenosis  History:         Patient has prior history of Echocardiogram examinations, most                  recent 08/21/2020. Previous Myocardial Infarction, Aortic Valve                  Disease and Mitral Valve Disease, Signs/Symptoms:Dyspnea; Risk                  Factors:Sleep Apnea. Severe aortic stenosis. Hypoxia.  Sonographer:     Roseanna Rainbow RDCS Referring Phys:  [place] Diagnosing Phys: Ena Dawley MD  Sonographer Comments: Technically difficult study due to poor echo windows and patient is morbidly obese. TAVR procedure using 71m Medtronic valve. IMPRESSIONS  1. Periprocedural TTE during a TAVR procedure. A 29 mm Medtronic Evolut Pro + was successfully deployed in the aortic position with improvement of peak/mean transaortic gradients 56/39 mmHg to 6/3 mmHg. There was trivial paravalvular leak and no pericardial effusion post procedure. Dimensionless index improved from 0.29 to 0.88.  2. Left ventricular ejection fraction, by estimation, is 60 to 65%. The left ventricle has normal function. There is severe concentric left ventricular hypertrophy. Left ventricular diastolic function could not be evaluated.  3. Mild mitral valve regurgitation. Moderate mitral annular calcification.  4. There is severe calcifcation of the aortic valve. There is severe thickening of the aortic valve. Severe aortic valve stenosis. Aortic valve mean gradient measures 39.0 mmHg. FINDINGS  Left Ventricle: Left ventricular ejection fraction, by estimation, is 60 to 65%. The left ventricle has normal function. There is severe concentric left ventricular hypertrophy. Left ventricular diastolic function could not be evaluated. Mitral Valve: There is moderate thickening of the mitral valve leaflet(s). There is moderate calcification of the mitral valve leaflet(s). Moderate mitral annular calcification. Mild mitral valve regurgitation. Tricuspid Valve: Tricuspid valve regurgitation is mild. Aortic Valve:  There is severe calcifcation of the aortic valve. There is severe thickening of the aortic valve. Severe aortic stenosis is present. Aortic valve mean gradient measures 39.0 mmHg. Aortic valve peak gradient measures 32.9 mmHg. Aortic valve area, by VTI measures 1.22 cm. LEFT VENTRICLE PLAX 2D LVIDd:         4.80 cm LVIDs:         3.40 cm LV PW:  1.20 cm LV IVS:        1.40 cm LVOT diam:     2.10 cm LV SV:         86 LV SV Index:   34 LVOT Area:     3.46 cm  LEFT ATRIUM         Index LA diam:    3.80 cm 1.50 cm/m  AORTIC VALVE AV Area (Vmax):    1.35 cm AV Area (Vmean):   1.12 cm AV Area (VTI):     1.22 cm AV Vmax:           287.00 cm/s AV Vmean:          224.933 cm/s AV VTI:            0.702 m AV Peak Grad:      32.9 mmHg AV Mean Grad:      39.0 mmHg LVOT Vmax:         111.50 cm/s LVOT Vmean:        72.500 cm/s LVOT VTI:          0.248 m LVOT/AV VTI ratio: 0.35  AORTA Ao Root diam: 3.30 cm  SHUNTS Systemic VTI:  0.25 m Systemic Diam: 2.10 cm Ena Dawley MD Electronically signed by Ena Dawley MD Signature Date/Time: 09/01/2020/11:33:41 AM    Final (Updated)    Structural Heart Procedure  Result Date: 09/01/2020 See surgical note for result.  CT Angio Abd/Pel w/ and/or w/o  Result Date: 08/20/2020 CLINICAL DATA:  64 year old male with history of severe aortic stenosis. Preprocedural study prior to potential transcatheter aortic valve replacement (TAVR) procedure. EXAM: CT ANGIOGRAPHY CHEST, ABDOMEN AND PELVIS TECHNIQUE: Non-contrast CT of the chest was initially obtained. Multidetector CT imaging through the chest, abdomen and pelvis was performed using the standard protocol during bolus administration of intravenous contrast. Multiplanar reconstructed images and MIPs were obtained and reviewed to evaluate the vascular anatomy. CONTRAST:  164m OMNIPAQUE IOHEXOL 350 MG/ML SOLN COMPARISON:  None. FINDINGS: CTA CHEST FINDINGS Cardiovascular: Heart size is mildly enlarged. There is no  significant pericardial fluid, thickening or pericardial calcification. There is aortic atherosclerosis, as well as atherosclerosis of the great vessels of the mediastinum and the coronary arteries, including calcified atherosclerotic plaque in the left anterior descending, left circumflex and right coronary arteries. Severe thickening calcification of the aortic valve. Mild calcifications of the mitral annulus. Mediastinum/Nodes: No pathologically enlarged mediastinal or hilar lymph nodes. Esophagus is unremarkable in appearance. No axillary lymphadenopathy. Lungs/Pleura: Patchy areas of ground-glass attenuation and mild interlobular septal thickening noted throughout the lungs bilaterally, favored to reflect a background of mild interstitial pulmonary edema. No confluent consolidative airspace disease. Moderate right and small left pleural effusions lying dependently with some associated passive subsegmental atelectasis in the lower lobes of the lungs bilaterally. Mild scarring in the medial aspect of the right upper lobe and inferior segment of the lingula. Musculoskeletal: There are no aggressive appearing lytic or blastic lesions noted in the visualized portions of the skeleton. CTA ABDOMEN AND PELVIS FINDINGS Hepatobiliary: Diffuse low attenuation throughout the hepatic parenchyma, indicative of a background of hepatic steatosis. In the inferior aspect of segment 5 of the liver (axial image 134 of series 4) there is a small hypovascular lesion measuring 1.3 cm in diameter (44 HU), which is indeterminate. No other larger more suspicious appearing hepatic lesions. No intra or extrahepatic biliary ductal dilatation. Gallbladder is normal in appearance. Pancreas: No pancreatic mass. No pancreatic ductal dilatation. No pancreatic or peripancreatic  fluid collections or inflammatory changes. Spleen: Unremarkable. Adrenals/Urinary Tract: Subcentimeter low-attenuation lesions in the right kidney, too small to  characterize, but statistically likely to represent cysts. Left kidney and bilateral adrenal glands are normal in appearance. No hydroureteronephrosis. Urinary bladder is normal in appearance. Stomach/Bowel: The appearance of the stomach is normal. No pathologic dilatation of small bowel or colon. Normal appendix. Vascular/Lymphatic: Aortic atherosclerosis, with vascular findings and measurements pertinent to potential TAVR procedure, as detailed below. No aneurysm or dissection noted in the abdominal or pelvic vasculature. No lymphadenopathy noted in the abdomen or pelvis. Reproductive: Prostate gland and seminal vesicles are unremarkable in appearance. Other: Moderate-sized umbilical hernia containing only omental fat. Left inguinal hernia containing predominantly fat. No significant volume of ascites. No pneumoperitoneum. Musculoskeletal: There are no aggressive appearing lytic or blastic lesions noted in the visualized portions of the skeleton. VASCULAR MEASUREMENTS PERTINENT TO TAVR: AORTA: Minimal Aortic Diameter-13 x 12 mm Severity of Aortic Calcification-moderate to severe RIGHT PELVIS: Right Common Iliac Artery - Minimal Diameter-7.3 x 7.8 mm Tortuosity - mild Calcification-moderate Right External Iliac Artery - Minimal Diameter-8.1 x 4.2 mm Tortuosity - mild Calcification-mild Right Common Femoral Artery - Minimal Diameter-7.9 x 7.6 mm Tortuosity - mild Calcification-mild LEFT PELVIS: Left Common Iliac Artery - Minimal Diameter-7.4 x 7.8 mm Tortuosity - mild Calcification-moderate Left External Iliac Artery - Minimal Diameter-7.7 x 5.8 mm Tortuosity - mild Calcification-none Left Common Femoral Artery - Minimal Diameter-8.2 x 8.2 mm Tortuosity-mild Calcification-minimal Review of the MIP images confirms the above findings. IMPRESSION: 1. Vascular findings and measurements pertinent to potential TAVR procedure, as detailed above. 2. Severe thickening calcification of the aortic valve, compatible with the  reported clinical history of severe aortic stenosis. 3. Aortic atherosclerosis, in addition to three vessel coronary artery disease. Please note that although the presence of coronary artery calcium documents the presence of coronary artery disease, the severity of this disease and any potential stenosis cannot be assessed on this non-gated CT examination. Assessment for potential risk factor modification, dietary therapy or pharmacologic therapy may be warranted, if clinically indicated. 4. Cardiomegaly with evidence of interstitial pulmonary edema and bilateral pleural effusions (right greater than left); imaging findings suggestive of congestive heart failure. 5. Hepatic steatosis. 6. 1.3 cm indeterminate hypovascular lesion in segment 5 of the liver. Correlation with nonemergent abdominal MRI with and without IV gadolinium is recommended in the near future to definitively characterize this lesion and exclude neoplasm. 7. Additional incidental findings, as above. Electronically Signed   By: Vinnie Langton M.D.   On: 08/20/2020 08:53   Disposition   Pt is being discharged home today in good condition.  Follow-up Plans & Appointments     Follow-up Information    Eileen Stanford, PA-C. Go on 09/16/2020.   Specialties: Cardiology, Radiology Why: @ 2:30pm, please arrive at least 10 minutes early.  Contact information: [address] [place] [address] [phone]                Discharge Medications   Allergies as of 09/02/2020      Reactions   Cocoa Hives   Sulfamethoxazole-trimethoprim Anaphylaxis   Chocolate Hives   Citrus Hives   Reaction to oranges and orange juice   Fish Allergy Hives, Swelling   Throat swelling   Shellfish Allergy Hives, Swelling   Throat swelling   Sulfa Antibiotics Hives, Swelling   Throat swelling   Tetracycline Swelling      Medication List    TAKE these medications  albuterol 108 (90 Base) MCG/ACT inhaler Commonly  known as: VENTOLIN HFA Inhale 2 puffs into the lungs every 4 (four) hours as needed for wheezing or shortness of breath.   aspirin EC 81 MG tablet Take 81 mg by mouth at bedtime. Swallow whole.   Cholecalciferol 25 MCG (1000 UT) tablet Take 1,000 Units by mouth at bedtime.   clopidogrel 75 MG tablet Commonly known as: PLAVIX Take 1 tablet (75 mg total) by mouth daily with breakfast. Start taking on: September 03, 2020   Fifty50 Glucose Meter 2.0 w/Device Kit See admin instructions.   furosemide 40 MG tablet Commonly known as: LASIX Take 0.5 tablets (20 mg total) by mouth daily. Notes to patient: Restart on 11/18   gabapentin 300 MG capsule Commonly known as: NEURONTIN Take 600 mg by mouth 2 (two) times daily.   HYDROcodone-acetaminophen 10-325 MG tablet Commonly known as: NORCO Take 1 tablet by mouth every 6 (six) hours as needed for moderate pain.   latanoprost 0.005 % ophthalmic solution Commonly known as: XALATAN Place 1 drop into both eyes at bedtime.   lisinopril 40 MG tablet Commonly known as: ZESTRIL Take 1 tablet (40 mg total) by mouth at bedtime. Notes to patient: Restart on 11/18   metFORMIN 1000 MG tablet Commonly known as: GLUCOPHAGE Take 1,000 mg by mouth 2 (two) times daily. Notes to patient: Restart on 11/18PM   metoprolol tartrate 25 MG tablet Commonly known as: LOPRESSOR Take 1 tablet (25 mg total) by mouth 2 (two) times daily.   multivitamin with minerals Tabs tablet Take 1 tablet by mouth at bedtime.   pantoprazole 40 MG tablet Commonly known as: PROTONIX Take 40 mg by mouth 2 (two) times daily.   potassium chloride SA 20 MEQ tablet Commonly known as: KLOR-CON Take 1 tablet (20 mEq total) by mouth daily.   PRESCRIPTION MEDICATION Inhale into the lungs at bedtime. CPAP   rosuvastatin 20 MG tablet Commonly known as: CRESTOR Take 20 mg by mouth daily.   Xtampza ER 9 MG C12a Generic drug: oxyCODONE ER Take 9 mg by mouth every 12  (twelve) hours.           Outstanding Labs/Studies   BMET  Duration of Discharge Encounter   Greater than 30 minutes including physician time.  SignedAngelena Form, PA-C 09/02/2020, 11:50 AM [phone]

## 2020-09-02 NOTE — Progress Notes (Signed)
Pt refused to ambulate this morning. Stated he has knees pain after trying to get out of bed to check his daily weight.   Pain tolerated well and able to rest after Oxycodone given PRN. He's hemodynamically stable. NSR on monitor. Remained afebrile. SPO2 96% on CPAP with 4 LPM of O2 at night. Left and right groin dressing dry and clean. Negative for hematoma. Continue to monitor.  Filiberto Pinks, RN

## 2020-09-03 ENCOUNTER — Telehealth: Payer: Self-pay

## 2020-09-03 LAB — ECHOCARDIOGRAM LIMITED
AR max vel: 1.76 cm2
AV Area VTI: 2.09 cm2
AV Area mean vel: 1.95 cm2
AV Mean grad: 17 mmHg
AV Peak grad: 34.3 mmHg
Ao pk vel: 2.93 m/s
Height: 71 in
Weight: 4936 oz

## 2020-09-03 NOTE — Telephone Encounter (Signed)
Patient contacted regarding discharge from [facility] on 09/02/2020 .  Patient understands to follow up with provider Carlean Jews PA-C on 09/16/20 at 2:30 PM at [facility] location. Patient understands discharge instructions? yes Patient understands medications and regiment? yes Patient understands to bring all medications to this visit? yes

## 2020-09-09 ENCOUNTER — Telehealth: Payer: Self-pay | Admitting: Internal Medicine

## 2020-09-09 LAB — GLUCOSE, CAPILLARY: Glucose-Capillary: 158 mg/dL — ABNORMAL HIGH (ref 70–99)

## 2020-09-09 NOTE — Telephone Encounter (Signed)
Spoke with the patient. Folic acid, diltiazem and clonidine were not on his medication list on discharge paperwork.  Wanted to verify that he is not to restart them. Adv there are marked as "stop at discharge" so he should not restart at this time.  Voices understanding.  Will discuss further at clinic follow up next week.

## 2020-09-09 NOTE — Telephone Encounter (Signed)
Per [facility], patient is needing someone to go over his medications with him.

## 2020-09-15 NOTE — Progress Notes (Signed)
[facility] [place]                                       Cardiology Office Note    Date:  09/16/2020   ID:  [name], DOB 1955-11-04, MRN [id number]  PCP:  [name], [name], MD  Cardiologist: Werner Lean, MD / Dr. Buena Irish & Dr. Cyndia Bent (TAVR)  CC: [facility] s/p TAVR  History of Present Illness:  Lawrence Rose is a 64 y.o. male with a history of HTN, HLD, anemia,morbid obesity,chronic back and knee pain with limited mobility,OSAon CPAP, CKD stage III, pulmonary HTN, sick euthyroid syndrome, mild MS and severe aortic stenosis s/p TAVR (09/01/20) who presents to clinic for follow up.   The patient was recently admitted for acute heart failure requiring IV diuresis. Transthoracic echocardiogram showed EF 60 to 65%, moderate concentric LVH, indeterminate diastolic function, severe left atrial dilation, mild mitral stenosis (mean gradient of 6 mm Hg at HR of 101bpm), severe aortic valve stenosis with anAVA0.9 cm, mean gradient 51 mmHg, peak gradient 82 mmHg, V-max 4.65 cm, DVI 0.2. He underwent left and right heart catheterization on 08/18/2020 which showed widely patent coronary arteries, high cardiac output at 9.06 L/min by Fick and 9.23 L/min by thermal dilution,severe pulmonary hypertension with a mean pressure of 48 mmHg, pulmonary capillary wedge pressure mean 34 mmHg, pulmonary vascular resistance 1.55 Woods units, mean right atrial pressure 19 mmHg, aortic valve peak gradient of 33 mmHg, mean gradient 22 mmHg, aortic valve area 1.94 cm by Fick and 1.98 cm by thermal dilution, LVEDP 20 mmHg. Further diuresis was recommended. There was some question of hishigh cardiac output being related to possible thyrotoxicosis as he was noted to have a suppressed TSH with a normal T4. He underwent full TAVR work-up during his admission and felt to be a good TAVR candidate at that time. However, it was recommended that this be delayed given  acute kidney injury secondary to diuresis and contrast dye studies  He was evaluated by the multidisciplinary valve team and underwent successful TAVR with a 29 mm Medtronic Evolut Pro+ THV via the TF approach on 09/01/20. Post operative echo showed EF >75%, normally functioning TAVR with a mean gradient of 17 mmHg and no PVL. He had mild AKI with creat 0.9--> 1.34. He was discharged on aspirin and plavix.   Today he presents to clinic for follow up. Here with his brother and feeling much better after TAVR. No CP or SOB. No LE edema, orthopnea or PND. No dizziness or syncope. No blood in stool or urine. No palpitations.     Past Medical History:  Diagnosis Date  . Acid reflux   . Allergic arthritis, hand    per patient, both hands  . Apnea   . Asthma   . Carpal tunnel syndrome    per patient, both hands  . Chronic back pain   . Chronic knee pain   . Diabetes mellitus without complication ([place])   . Hypertension   . 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 Angelena Form and Dr. Cyndia Bent.   . Severe aortic stenosis   . Sleep apnea    wears CPAP every night    Past Surgical History:  Procedure Laterality Date  . BACK SURGERY    .  RIGHT/LEFT HEART CATH AND CORONARY ANGIOGRAPHY N/A 08/18/2020   Procedure: RIGHT/LEFT HEART CATH AND CORONARY ANGIOGRAPHY;  Surgeon: [name], MD;  Location: [place] [facility];  Service: Cardiovascular;  Laterality: N/A;  . TEE WITHOUT CARDIOVERSION N/A 08/21/2020   Procedure: TRANSESOPHAGEAL ECHOCARDIOGRAM (TEE);  Surgeon: [name], MD;  Location: [facility] ENDOSCOPY;  Service: Cardiovascular;  Laterality: N/A;  . TEE WITHOUT CARDIOVERSION N/A 09/01/2020   Procedure: TRANSESOPHAGEAL ECHOCARDIOGRAM (TEE);  Surgeon: [name], MD;  Location: [place] [facility];  Service: Open Heart Surgery;  Laterality: N/A;  . TRANSCATHETER AORTIC VALVE REPLACEMENT,  TRANSFEMORAL N/A 09/01/2020   Procedure: TRANSCATHETER AORTIC VALVE REPLACEMENT, TRANSFEMORAL;  Surgeon: [name], MD;  Location: [place] [facility];  Service: Open Heart Surgery;  Laterality: N/A;    Current Medications: Outpatient Medications Prior to Visit  Medication Sig Dispense Refill  . albuterol (VENTOLIN HFA) 108 (90 Base) MCG/ACT inhaler Inhale 2 puffs into the lungs every 4 (four) hours as needed for wheezing or shortness of breath.     Marland Kitchen aspirin EC 81 MG tablet Take 81 mg by mouth at bedtime. Swallow whole.    . Blood Glucose Monitoring Suppl (FIFTY50 GLUCOSE METER 2.0) w/Device KIT See admin instructions.    . Cholecalciferol 25 MCG (1000 UT) tablet Take 1,000 Units by mouth at bedtime.     . clopidogrel (PLAVIX) 75 MG tablet Take 1 tablet (75 mg total) by mouth daily with breakfast. 90 tablet 1  . gabapentin (NEURONTIN) 300 MG capsule Take 600 mg by mouth 2 (two) times daily.     Marland Kitchen HYDROcodone-acetaminophen (NORCO) 10-325 MG tablet Take 1 tablet by mouth every 6 (six) hours as needed for moderate pain.     Marland Kitchen latanoprost (XALATAN) 0.005 % ophthalmic solution Place 1 drop into both eyes at bedtime.     . metFORMIN (GLUCOPHAGE) 1000 MG tablet Take 1,000 mg by mouth 2 (two) times daily.    . metoprolol tartrate (LOPRESSOR) 25 MG tablet Take 1 tablet (25 mg total) by mouth 2 (two) times daily. 60 tablet 3  . Multiple Vitamin (MULTIVITAMIN WITH MINERALS) TABS tablet Take 1 tablet by mouth at bedtime.     Marland Kitchen oxyCODONE ER (XTAMPZA ER) 9 MG C12A Take 9 mg by mouth every 12 (twelve) hours.    . pantoprazole (PROTONIX) 40 MG tablet Take 40 mg by mouth 2 (two) times daily.    Marland Kitchen PRESCRIPTION MEDICATION Inhale into the lungs at bedtime. CPAP    . rosuvastatin (CRESTOR) 20 MG tablet Take 20 mg by mouth daily.     . furosemide (LASIX) 40 MG tablet Take 0.5 tablets (20 mg total) by mouth daily. 30 tablet   . lisinopril (ZESTRIL) 40 MG tablet Take 1 tablet (40 mg total) by mouth at  bedtime.    . potassium chloride SA (KLOR-CON) 20 MEQ tablet Take 1 tablet (20 mEq total) by mouth daily.     No facility-administered medications prior to visit.     Allergies:   Cocoa, Sulfamethoxazole-trimethoprim, Chocolate, Citrus, Fish allergy, Shellfish allergy, Sulfa antibiotics, and Tetracycline   Social History   Socioeconomic History  . Marital status: Single    Spouse name: Not on file  . Number of children: Not on file  . Years of education: Not on file  . Highest education level: Not on file  Occupational History  . Not on file  Tobacco Use  . Smoking status: Former Research scientist (life sciences)  . Smokeless tobacco: Never Used  .  Tobacco comment: per patient quit over 30 years ago  Vaping Use  . Vaping Use: Never used  Substance and Sexual Activity  . Alcohol use: No  . Drug use: Not Currently  . Sexual activity: Not on file  Other Topics Concern  . Not on file  Social History Narrative  . Not on file   Social Determinants of Health   Financial Resource Strain:   . Difficulty of Paying Living Expenses: Not on file  Food Insecurity:   . Worried About Charity fundraiser in the Last Year: Not on file  . Ran Out of Food in the Last Year: Not on file  Transportation Needs:   . Lack of Transportation (Medical): Not on file  . Lack of Transportation (Non-Medical): Not on file  Physical Activity:   . Days of Exercise per Week: Not on file  . Minutes of Exercise per Session: Not on file  Stress:   . Feeling of Stress : Not on file  Social Connections:   . Frequency of Communication with Friends and Family: Not on file  . Frequency of Social Gatherings with Friends and Family: Not on file  . Attends Religious Services: Not on file  . Active Member of Clubs or Organizations: Not on file  . Attends Archivist Meetings: Not on file  . Marital Status: Not on file     Family History:  The patient'sfamily history is not on file.     ROS:   Please see the history of  present illness.    ROS All other systems reviewed and are negative.   PHYSICAL EXAM:   VS:  BP (!) 84/60   Pulse (!) 136   Ht 5' 11" (1.803 m)   Wt (!) 316 lb 3.2 oz (143.4 kg)   SpO2 95%   BMI 44.10 kg/m    GEN: Well nourished, well developed, in no acute distress, morbidly obese HEENT: normal Neck: no JVD or masses Cardiac: RR tachy; no murmurs, rubs, or gallops,no edema  Respiratory:  clear to auscultation bilaterally, normal work of breathing GI: soft, nontender, nondistended, + BS MS: no deformity or atrophy Skin: warm and dry, no rash  Groin sites clear without hematoma or ecchymosis  Neuro:  Alert and Oriented x 3, Strength and sensation are intact Psych: euthymic mood, full affect   Wt Readings from Last 3 Encounters:  09/16/20 (!) 316 lb 3.2 oz (143.4 kg)  09/02/20 (!) 308 lb 8 oz (139.9 kg)  08/27/20 (!) 307 lb 6.4 oz (139.4 kg)      Studies/Labs Reviewed:   EKG:  EKG is ordered today.  The ekg ordered today demonstrates sinus tach with diffuse ST elevation, HR 136  Recent Labs: 08/16/2020: TSH 0.176 08/27/2020: ALT 18; B Natriuretic Peptide 48.3 09/02/2020: Magnesium 2.0 09/16/2020: BUN 32; Creatinine, Ser 1.45; Hemoglobin 8.5; Platelets 274; Potassium 5.0; Sodium 136   Lipid Panel    Component Value Date/Time   CHOL 127 08/17/2020 0351   TRIG 69 08/17/2020 0351   HDL 39 (L) 08/17/2020 0351   CHOLHDL 3.3 08/17/2020 0351   VLDL 14 08/17/2020 0351   LDLCALC 74 08/17/2020 0351    Additional studies/ records that were reviewed today include:  TAVR OPERATIVE NOTE  Lawrence Rose [id number]  Date of Procedure:09/01/2020  Preoperative Diagnosis:Severe Aortic Stenosis   Postoperative Diagnosis:Same   Procedure:   Transcatheter Aortic Valve Replacement - Percutaneous RightTransfemoral Approach Medtronic Evolut-Pro+(size25m, serial # [id number]  Co-Surgeons:Bryan KAlveria Apley MD andChristopher  Angelena Form, MD   Anesthesiologist:Robert Ola Spurr, MD  Dala Dock, MD  Pre-operative Echo Findings: ? Severe aortic stenosis and moderate AI ? Normalleft ventricular systolic function  Post-operative Echo Findings: ? Noparavalvular leak ? Normalleft ventricular systolic function  _____________   Echo 09/02/20: IMPRESSIONS    1. Left ventricular ejection fraction, by estimation, is >75%. The left  ventricle has hyperdynamic function. The left ventricle has no regional  wall motion abnormalities.  2. Right ventricular systolic function is normal. The right ventricular  size is normal.  3. The mitral valve is degenerative. Trivial mitral valve regurgitation.  No evidence of mitral stenosis. Severe mitral annular calcification.  4. Post TAVR using 29 mm Evolut Pro Medtronic supra annlular valve.  Imaging is sub otpimal very trivial PVL not localized by SA images.  Gradients on non imaging pedoff SS notch images seem high. I measure peak  velocity 1.5 m/sec mean gradient 5 mmHg  peak 9 mmHg. This is in setting of very hyperdynamic LV fucnton with LVOT  velocity 1.3 m/sec. AVA 2.1 cm2 and DVI 0.5 . The aortic valve has been  repaired/replaced. Aortic valve regurgitation is trivial. No aortic  stenosis is present.  5. The inferior vena cava is normal in size with greater than 50%  respiratory variability, suggesting right atrial pressure of 3 mmHg.    ______________________  Stat limited echo 09/16/20 IMPRESSIONS  1. Left ventricular ejection fraction, by estimation, is 55 to 60%. The  left ventricle has normal function. The left ventricle has no regional  wall motion abnormalities. There is mild concentric left ventricular  hypertrophy. Indeterminate diastolic  filling due to E-A fusion.  2. Right ventricular systolic function is normal. The right ventricular  size is normal.  Tricuspid regurgitation signal is inadequate for assessing  PA pressure.  3. The mitral valve is degenerative. No evidence of mitral valve  regurgitation. No evidence of mitral stenosis. The mean mitral valve  gradient is 3.0 mmHg with average heart rate of 130 bpm.  4. The aortic valve has been repaired/replaced. Aortic valve  regurgitation is not visualized. There is a CoreValve-Evolut Pro  prosthetic (TAVR) valve present in the aortic position. Procedure Date:  09/01/2020. Echo findings are consistent with normal  structure and function of the aortic valve prosthesis.  5. The inferior vena cava is normal in size with greater than 50%  respiratory variability, suggesting right atrial pressure of 3 mmHg.   Comparison(s): Prior images reviewed side by side. The left ventricular systolic function is substantially lower compared to 2 weeks earlier, but remains in normal range.  ASSESSMENT & PLAN:   Severe AS s/p TAVR:doing excellent clinically. Groin sites are healing well. ECG shows sinus tachy with diffuse ST elevation. Pt denies and chest pain or shortness of breath and feels fine. Given hypotension and tachycardia with ST elevation he was set up for stat BMET, CBC, HS trop and limited echo to rule out WMA or pericardial effusion. This showed EF 55-60% (down from 75%), no regional WMAs, normally functioning TAVR valve with no pericardial effusion. Troponin was negative and other labs revealed mild AKI and worsening of chronic anemia. Question pericarditis, but pt is asymptomatic. Discussed case with Dr. Burt Knack and felt okay for discharge home with close follow up in the office next week.   Hypotension: I personally repeated BP and got 80/55. Will stop Lisinopril and lasix.   AKI: creat up to 1.45 today. Holding nephrotoxic medications as above. Will repeat BMET next week.   Acute  on chronic anemia: Hg down to 8.5 from baseline of 9-11. Will recheck CBC next week.    Liver lesion: pre  TAVR scan showed 1.3 cm indeterminate hypovascular lesion in segment 5 of the liver. Correlation with nonemergent abdominal MRI with and without IV gadolinium is recommended in the near future to definitively characterize this lesion and exclude neoplasm. This was not discussed today.   Total time spent with patient was over 40 minutes which included evaluating patient, reviewing record and coordinating care. Face to face time >50%. I discussed case with Dr. Quentin Ore, Dr. Burt Knack and Dr. Sallyanne Kuster.    Medication Adjustments/Labs and Tests Ordered: Current medicines are reviewed at length with the patient today.  Concerns regarding medicines are outlined above.  Medication changes, Labs and Tests ordered today are listed in the Patient Instructions below. Patient Instructions  Medication Instructions:  Your physician has recommended you make the following change in your medication:  1.) stop lisinopril 2.) stop furosemide 3.) stop potassium  *If you need a refill on your cardiac medications before your next appointment, please call your pharmacy*   Lab Work: TODAY: BMET, CBC, TROPONIN T) If you have labs (blood work) drawn today and your tests are completely normal, you will receive your results only by: Marland Kitchen MyChart Message (if you have MyChart) OR . A paper copy in the mail If you have any lab test that is abnormal or we need to change your treatment, we will call you to review the results.   Testing/Procedures: Limited STAT echo --TODAY at 3:15 pm. Go to [facility] "[facility]"   Follow-Up: As scheduled     Signed, Angelena Form, PA-C  09/16/2020 9:24 PM    [address], [place], [place]  [address] Phone: [phone]; Fax: [phone]

## 2020-09-16 ENCOUNTER — Other Ambulatory Visit: Payer: Self-pay

## 2020-09-16 ENCOUNTER — Ambulatory Visit (HOSPITAL_COMMUNITY)
Admission: RE | Admit: 2020-09-16 | Discharge: 2020-09-16 | Disposition: A | Discharge status: Home / Self Care | Payer: Medicare HMO | Source: Ambulatory Visit | Attending: Physician Assistant | Admitting: Physician Assistant

## 2020-09-16 ENCOUNTER — Encounter: Payer: Self-pay | Admitting: Physician Assistant

## 2020-09-16 ENCOUNTER — Ambulatory Visit: Payer: Medicare HMO | Admitting: Physician Assistant

## 2020-09-16 VITALS — BP 84/60 | HR 136 | Ht 71.0 in | Wt 316.2 lb

## 2020-09-16 DIAGNOSIS — K922 Gastrointestinal hemorrhage, unspecified: Secondary | ICD-10-CM

## 2020-09-16 DIAGNOSIS — K769 Liver disease, unspecified: Secondary | ICD-10-CM | POA: Diagnosis not present

## 2020-09-16 DIAGNOSIS — I080 Rheumatic disorders of both mitral and aortic valves: Secondary | ICD-10-CM | POA: Diagnosis not present

## 2020-09-16 DIAGNOSIS — I313 Pericardial effusion (noninflammatory): Secondary | ICD-10-CM | POA: Diagnosis not present

## 2020-09-16 DIAGNOSIS — N179 Acute kidney failure, unspecified: Secondary | ICD-10-CM | POA: Insufficient documentation

## 2020-09-16 DIAGNOSIS — Z952 Presence of prosthetic heart valve: Secondary | ICD-10-CM | POA: Diagnosis not present

## 2020-09-16 DIAGNOSIS — R000 Tachycardia, unspecified: Secondary | ICD-10-CM | POA: Diagnosis not present

## 2020-09-16 DIAGNOSIS — I272 Pulmonary hypertension, unspecified: Secondary | ICD-10-CM | POA: Diagnosis not present

## 2020-09-16 DIAGNOSIS — I959 Hypotension, unspecified: Secondary | ICD-10-CM | POA: Diagnosis not present

## 2020-09-16 DIAGNOSIS — I9589 Other hypotension: Secondary | ICD-10-CM | POA: Diagnosis not present

## 2020-09-16 DIAGNOSIS — G4730 Sleep apnea, unspecified: Secondary | ICD-10-CM | POA: Insufficient documentation

## 2020-09-16 HISTORY — DX: Gastrointestinal hemorrhage, unspecified: K92.2

## 2020-09-16 LAB — CBC
Hematocrit: 27.2 % — ABNORMAL LOW (ref 37.5–51.0)
Hemoglobin: 8.5 g/dL — ABNORMAL LOW (ref 13.0–17.7)
MCH: 24.9 pg — ABNORMAL LOW (ref 26.6–33.0)
MCHC: 31.3 g/dL — ABNORMAL LOW (ref 31.5–35.7)
MCV: 80 fL (ref 79–97)
Platelets: 274 10*3/uL (ref 150–450)
RBC: 3.41 x10E6/uL — ABNORMAL LOW (ref 4.14–5.80)
RDW: 16.2 % — ABNORMAL HIGH (ref 11.6–15.4)
WBC: 6.2 10*3/uL (ref 3.4–10.8)

## 2020-09-16 LAB — BASIC METABOLIC PANEL
BUN/Creatinine Ratio: 22 (ref 10–24)
BUN: 32 mg/dL — ABNORMAL HIGH (ref 8–27)
CO2: 29 mmol/L (ref 20–29)
Calcium: 9.8 mg/dL (ref 8.6–10.2)
Chloride: 98 mmol/L (ref 96–106)
Creatinine, Ser: 1.45 mg/dL — ABNORMAL HIGH (ref 0.76–1.27)
GFR calc Af Amer: 58 mL/min/{1.73_m2} — ABNORMAL LOW (ref >59)
GFR calc non Af Amer: 50 mL/min/{1.73_m2} — ABNORMAL LOW (ref >59)
Glucose: 125 mg/dL — ABNORMAL HIGH (ref 65–99)
Potassium: 5 mmol/L (ref 3.5–5.2)
Sodium: 136 mmol/L (ref 134–144)

## 2020-09-16 LAB — ECHOCARDIOGRAM LIMITED
AR max vel: 3.03 cm2
AV Area VTI: 3.43 cm2
AV Area mean vel: 3.33 cm2
AV Mean grad: 4.5 mmHg
AV Peak grad: 8.8 mmHg
Ao pk vel: 1.49 m/s
Area-P 1/2: 5.84 cm2
Height: 71 in
S' Lateral: 2.37 cm
Weight: 5059.2 oz

## 2020-09-16 LAB — TROPONIN T: Troponin T (Highly Sensitive): 19 ng/L (ref 0–22)

## 2020-09-16 NOTE — Progress Notes (Signed)
  Echocardiogram 2D Echocardiogram has been performed.  Lawrence Rose 09/16/2020, 4:13 PM

## 2020-09-16 NOTE — Patient Instructions (Signed)
Medication Instructions:  Your physician has recommended you make the following change in your medication:  1.) stop lisinopril 2.) stop furosemide 3.) stop potassium  *If you need a refill on your cardiac medications before your next appointment, please call your pharmacy*   Lab Work: TODAY: BMET, CBC, TROPONIN T) If you have labs (blood work) drawn today and your tests are completely normal, you will receive your results only by: Marland Kitchen MyChart Message (if you have MyChart) OR . A paper copy in the mail If you have any lab test that is abnormal or we need to change your treatment, we will call you to review the results.   Testing/Procedures: Limited STAT echo --TODAY at 3:15 pm. Go to [facility] "C"   Follow-Up: As scheduled

## 2020-09-17 ENCOUNTER — Telehealth: Payer: Self-pay | Admitting: Physician Assistant

## 2020-09-17 NOTE — Telephone Encounter (Signed)
I already spoke with him. It told him he didn't have any restrictions. I will see him in the office next Thursday at 1:30pm.

## 2020-09-17 NOTE — Telephone Encounter (Signed)
Pt called In and would like to know if he is still under "Quarantine" and he can he drive ?  He requested to speak to Carlean Jews nurse   Best number t [phone]

## 2020-09-21 NOTE — Progress Notes (Signed)
Lawrence Rose                                       Cardiology Office Note    Date:  09/24/2020   ID:  [name], DOB 27-Dec-1955, MRN [id number]  PCP:  [name], [name], MD  Cardiologist: Werner Lean, MD / Dr. Buena Irish & Dr. Cyndia Bent (TAVR)  CC: follow up for hypotension, abnormal ECG, AKI and anemia.  History of Present Illness:  Lawrence Rose is a 64 y.o. male with a history of HTN, HLD, anemia,morbid obesity,chronic back and knee pain with limited mobility,OSAon CPAP, CKD stage III, pulmonary HTN, sick euthyroid syndrome, mild MS and severe aortic stenosis s/p TAVR (09/01/20) who was seen in the office last week for transition of care visit. At that time he was hypotensive, tachycardic and there was diffuse ST elevation on ECG. Stat labs showed mild AKI and slight worsening of his chronic anemia. HS trop negative. Stat echo normal aside from slight decrease in EF from >75%--> 55%. Interestingly, the patient was feeling very well with no complaints. His lisinopril, lasix and potassium were held.   Today he presents to clinic for close outpatient follow up. Still feeling well. No complaints. No worsening dyspnea, swelling or fatigue. Does not feel his heart racing. No palpitations or chest pain.     Past Medical History:  Diagnosis Date  . Acid reflux   . Allergic arthritis, hand    per patient, both hands  . Apnea   . Asthma   . Carpal tunnel syndrome    per patient, both hands  . Chronic back pain   . Chronic knee pain   . Diabetes mellitus without complication ([place])   . Hypertension   . 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 Angelena Form and Dr. Cyndia Bent.   . Severe aortic stenosis   . Sleep apnea    wears CPAP every night    Past Surgical History:  Procedure Laterality Date  . BACK SURGERY    . RIGHT/LEFT  HEART CATH AND CORONARY ANGIOGRAPHY N/A 08/18/2020   Procedure: RIGHT/LEFT HEART CATH AND CORONARY ANGIOGRAPHY;  Surgeon: [name], MD;  Location: [facility];  Service: Cardiovascular;  Laterality: N/A;  . TEE WITHOUT CARDIOVERSION N/A 08/21/2020   Procedure: TRANSESOPHAGEAL ECHOCARDIOGRAM (TEE);  Surgeon: [name], MD;  Location: [facility] ENDOSCOPY;  Service: Cardiovascular;  Laterality: N/A;  . TEE WITHOUT CARDIOVERSION N/A 09/01/2020   Procedure: TRANSESOPHAGEAL ECHOCARDIOGRAM (TEE);  Surgeon: [name], MD;  Location: [facility];  Service: Open Heart Surgery;  Laterality: N/A;  . TRANSCATHETER AORTIC VALVE REPLACEMENT, TRANSFEMORAL N/A 09/01/2020   Procedure: TRANSCATHETER AORTIC VALVE REPLACEMENT, TRANSFEMORAL;  Surgeon: [name], MD;  Location: [facility];  Service: Open Heart Surgery;  Laterality: N/A;    Current Medications: Outpatient Medications Prior to Visit  Medication Sig Dispense Refill  . albuterol (VENTOLIN HFA) 108 (90 Base) MCG/ACT inhaler Inhale 2 puffs into the lungs every 4 (four) hours as needed for wheezing or shortness of breath.     . Blood Glucose Monitoring Suppl (FIFTY50 GLUCOSE METER 2.0) w/Device KIT See admin instructions.    . Cholecalciferol 25 MCG (1000 UT) tablet Take 1,000 Units by mouth at bedtime.     Marland Kitchen  diltiazem (CARDIZEM CD) 240 MG 24 hr capsule Take 1 capsule by mouth.    . gabapentin (NEURONTIN) 300 MG capsule Take 600 mg by mouth 2 (two) times daily.     Marland Kitchen HYDROcodone-acetaminophen (NORCO) 10-325 MG tablet Take 1 tablet by mouth every 6 (six) hours as needed for moderate pain.     Marland Kitchen latanoprost (XALATAN) 0.005 % ophthalmic solution Place 1 drop into both eyes at bedtime.     . metFORMIN (GLUCOPHAGE) 1000 MG tablet Take 1,000 mg by mouth 2 (two) times daily.    . metoprolol tartrate (LOPRESSOR) 25 MG tablet Take 1 tablet (25 mg total) by mouth 2 (two) times daily. 60 tablet 3  . Multiple  Vitamin (MULTIVITAMIN WITH MINERALS) TABS tablet Take 1 tablet by mouth at bedtime.     Marland Kitchen oxyCODONE ER (XTAMPZA ER) 9 MG C12A Take 9 mg by mouth every 12 (twelve) hours.    . pantoprazole (PROTONIX) 40 MG tablet Take 40 mg by mouth 2 (two) times daily.    Marland Kitchen PRESCRIPTION MEDICATION Inhale into the lungs at bedtime. CPAP    . rosuvastatin (CRESTOR) 20 MG tablet Take 20 mg by mouth daily.     Marland Kitchen aspirin EC 81 MG tablet Take 81 mg by mouth at bedtime. Swallow whole.    . clopidogrel (PLAVIX) 75 MG tablet Take 1 tablet (75 mg total) by mouth daily with breakfast. 90 tablet 1   No facility-administered medications prior to visit.     Allergies:   Cocoa, Sulfamethoxazole-trimethoprim, Chocolate, Citrus, Fish allergy, Shellfish allergy, Sulfa antibiotics, and Tetracycline   Social History   Socioeconomic History  . Marital status: Single    Spouse name: Not on file  . Number of children: Not on file  . Years of education: Not on file  . Highest education level: Not on file  Occupational History  . Not on file  Tobacco Use  . Smoking status: Former Research scientist (life sciences)  . Smokeless tobacco: Never Used  . Tobacco comment: per patient quit over 30 years ago  Vaping Use  . Vaping Use: Never used  Substance and Sexual Activity  . Alcohol use: No  . Drug use: Not Currently  . Sexual activity: Not on file  Other Topics Concern  . Not on file  Social History Narrative  . Not on file   Social Determinants of Health   Financial Resource Strain: Not on file  Food Insecurity: Not on file  Transportation Needs: Not on file  Physical Activity: Not on file  Stress: Not on file  Social Connections: Not on file     Family History:  The patient'sfamily history is not on file.     ROS:   Please see the history of present illness.    ROS All other systems reviewed and are negative.   PHYSICAL EXAM:   VS:  BP 102/62   Pulse (!) 136   Ht '5\' 11"'  (1.803 m)   Wt (!) 315 lb (142.9 kg)   SpO2 96%   BMI  43.93 kg/m    GEN: Well nourished, well developed, in no acute distress, morbidly obese HEENT: normal Neck: no JVD or masses Cardiac: RR tachy; no murmurs, rubs, or gallops,no edema  Respiratory:  clear to auscultation bilaterally, normal work of breathing GI: soft, nontender, nondistended, + BS MS: no deformity or atrophy Skin: warm and dry, no rash    Neuro:  Alert and Oriented x 3, Strength and sensation are intact Psych: euthymic mood, full affect  Wt Readings from Last 3 Encounters:  09/24/20 (!) 315 lb (142.9 kg)  09/16/20 (!) 316 lb 3.2 oz (143.4 kg)  09/02/20 (!) 308 lb 8 oz (139.9 kg)      Studies/Labs Reviewed:   EKG:  EKG is ordered today.  The ekg ordered today demonstrates atrial flutter with saw tooth pattern, HR 136   Recent Labs: 08/16/2020: TSH 0.176 08/27/2020: ALT 18; B Natriuretic Peptide 48.3 09/02/2020: Magnesium 2.0 09/16/2020: BUN 32; Creatinine, Ser 1.45; Hemoglobin 8.5; Platelets 274; Potassium 5.0; Sodium 136   Lipid Panel    Component Value Date/Time   CHOL 127 08/17/2020 0351   TRIG 69 08/17/2020 0351   HDL 39 (L) 08/17/2020 0351   CHOLHDL 3.3 08/17/2020 0351   VLDL 14 08/17/2020 0351   LDLCALC 74 08/17/2020 0351    Additional studies/ records that were reviewed today include:  TAVR OPERATIVE NOTE  Lawrence Rose [id number]  Date of Procedure:09/01/2020  Preoperative Diagnosis:Severe Aortic Stenosis   Postoperative Diagnosis:Same   Procedure:   Transcatheter Aortic Valve Replacement - Percutaneous RightTransfemoral Approach Medtronic Evolut-Pro+(size62m, serial # [id number]  Co-Surgeons:Bryan KAlveria Apley MD andChristopher MAngelena Form MD   Anesthesiologist:Robert FOla Spurr MD  EDala Dock MD  Pre-operative Echo Findings: ? Severe aortic stenosis and moderate AI ? Normalleft  ventricular systolic function  Post-operative Echo Findings: ? Noparavalvular leak ? Normalleft ventricular systolic function  _____________   Echo 09/02/20: IMPRESSIONS  1. Left ventricular ejection fraction, by estimation, is >75%. The left  ventricle has hyperdynamic function. The left ventricle has no regional  wall motion abnormalities.  2. Right ventricular systolic function is normal. The right ventricular  size is normal.  3. The mitral valve is degenerative. Trivial mitral valve regurgitation.  No evidence of mitral stenosis. Severe mitral annular calcification.  4. Post TAVR using 29 mm Evolut Pro Medtronic supra annlular valve.  Imaging is sub otpimal very trivial PVL not localized by SA images.  Gradients on non imaging pedoff SS notch images seem high. I measure peak  velocity 1.5 m/sec mean gradient 5 mmHg  peak 9 mmHg. This is in setting of very hyperdynamic LV fucnton with LVOT  velocity 1.3 m/sec. AVA 2.1 cm2 and DVI 0.5 . The aortic valve has been  repaired/replaced. Aortic valve regurgitation is trivial. No aortic  stenosis is present.  5. The inferior vena cava is normal in size with greater than 50%  respiratory variability, suggesting right atrial pressure of 3 mmHg.    ______________________  Stat limited echo 09/16/20 IMPRESSIONS  1. Left ventricular ejection fraction, by estimation, is 55 to 60%. The  left ventricle has normal function. The left ventricle has no regional  wall motion abnormalities. There is mild concentric left ventricular  hypertrophy. Indeterminate diastolic  filling due to E-A fusion.  2. Right ventricular systolic function is normal. The right ventricular  size is normal. Tricuspid regurgitation signal is inadequate for assessing  PA pressure.  3. The mitral valve is degenerative. No evidence of mitral valve  regurgitation. No evidence of mitral stenosis. The mean mitral valve  gradient is 3.0 mmHg with average heart  rate of 130 bpm.  4. The aortic valve has been repaired/replaced. Aortic valve  regurgitation is not visualized. There is a CoreValve-Evolut Pro  prosthetic (TAVR) valve present in the aortic position. Procedure Date:  09/01/2020. Echo findings are consistent with normal  structure and function of the aortic valve prosthesis.  5. The inferior vena cava is normal in size with greater than 50%  respiratory  variability, suggesting right atrial pressure of 3 mmHg.   Comparison(s): Prior images reviewed side by side. The left ventricular systolic function is substantially lower compared to 2 weeks earlier, but remains in normal range.  ASSESSMENT & PLAN:   Severe AS s/p TAVR:limited echo last week showed EF 55-60% (down from 75%), no regional WMAs, normally functioning TAVR valve with no pericardial effusion. 1 month TAVR follow up echo next week. Has been on DAPT but will stop this today and start Eliquis 68m BID (see below)  Hypotension: BP still soft today but improved from last week.   AKI: creat up to 1.45 . I held all nephrotoxic medications. BMET today.   Acute on chronic anemia: Hg down to 8.5 from baseline of 9-11. CBC today   New onset atrial flutter: HR remains elevated at 136. He is asymptomatic with this. Reviewed ECG with Dr. CJanace Hoardand we feel he is in rapid atrial flutter. We will plan to stop DAPT and start Eliquis 526mBID. He already takes Lopressor 2539mID and Cardizem CD 240m64mily. BP soft. Will load him with amiodarone 400mg63m and see him back next week for close follow up.    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 Correlation with nonemergent abdominal MRI with and without IV gadolinium is recommended in the near future to definitively characterize this lesion and exclude neoplasm. This was not discussed today.   Medication Adjustments/Labs and Tests Ordered: Current medicines are reviewed at  length with the patient today.  Concerns regarding medicines are outlined above.  Medication changes, Labs and Tests ordered today are listed in the Patient Instructions below. Patient Instructions  Medication Instructions:  1) STOP ASPIRIN 2) STOP PLAVIX 3) START ELIQUIS 5 mg twice daily 4) START AMIODARONE 400 mg twice daily *If you need a refill on your cardiac medications before your next appointment, please call your pharmacy*  Lab Work: TODAY! BMET, CBC If you have labs (blood work) drawn today and your tests are completely normal, you will receive your results only by: . MyCMarland Kitchenart Message (if you have MyChart) OR . A paper copy in the mail If you have any lab test that is abnormal or we need to change your treatment, we will call you to review the results.  Follow-Up: Please keep your appointments as scheduled!    Signed, KathrAngelena FormC  09/24/2020 2:45 PM    [facility] [address] [id number]: [phone]: [phone]

## 2020-09-24 ENCOUNTER — Encounter: Payer: Self-pay | Admitting: Physician Assistant

## 2020-09-24 ENCOUNTER — Other Ambulatory Visit: Payer: Self-pay

## 2020-09-24 ENCOUNTER — Ambulatory Visit: Payer: Medicare HMO | Admitting: Physician Assistant

## 2020-09-24 VITALS — BP 102/62 | HR 136 | Ht 71.0 in | Wt 315.0 lb

## 2020-09-24 DIAGNOSIS — D649 Anemia, unspecified: Secondary | ICD-10-CM | POA: Diagnosis not present

## 2020-09-24 DIAGNOSIS — K769 Liver disease, unspecified: Secondary | ICD-10-CM

## 2020-09-24 DIAGNOSIS — Z952 Presence of prosthetic heart valve: Secondary | ICD-10-CM

## 2020-09-24 DIAGNOSIS — I4892 Unspecified atrial flutter: Secondary | ICD-10-CM

## 2020-09-24 DIAGNOSIS — N179 Acute kidney failure, unspecified: Secondary | ICD-10-CM | POA: Diagnosis not present

## 2020-09-24 DIAGNOSIS — I959 Hypotension, unspecified: Secondary | ICD-10-CM

## 2020-09-24 MED ORDER — APIXABAN 5 MG PO TABS: 5.0000 mg | ORAL_TABLET | Freq: Two times a day (BID) | ORAL | 0 refills | Status: DC

## 2020-09-24 MED ORDER — APIXABAN 5 MG PO TABS: 5.0000 mg | ORAL_TABLET | Freq: Two times a day (BID) | ORAL | 3 refills | Status: DC

## 2020-09-24 MED ORDER — AMIODARONE HCL 200 MG PO TABS: 400.0000 mg | ORAL_TABLET | Freq: Two times a day (BID) | ORAL | 0 refills | Status: DC

## 2020-09-24 NOTE — Patient Instructions (Signed)
Medication Instructions:  1) STOP ASPIRIN 2) STOP PLAVIX 3) START ELIQUIS 5 mg twice daily 4) START AMIODARONE 400 mg twice daily *If you need a refill on your cardiac medications before your next appointment, please call your pharmacy*  Lab Work: TODAY! BMET, CBC If you have labs (blood work) drawn today and your tests are completely normal, you will receive your results only by: Marland Kitchen MyChart Message (if you have MyChart) OR . A paper copy in the mail If you have any lab test that is abnormal or we need to change your treatment, we will call you to review the results.  Follow-Up: Please keep your appointments as scheduled!

## 2020-09-25 LAB — CBC WITH DIFFERENTIAL/PLATELET
Basophils Absolute: 0.1 10*3/uL (ref 0.0–0.2)
Basos: 1 %
EOS (ABSOLUTE): 0.1 10*3/uL (ref 0.0–0.4)
Eos: 1 %
Hematocrit: 23.8 % — ABNORMAL LOW (ref 37.5–51.0)
Hemoglobin: 7.4 g/dL — ABNORMAL LOW (ref 13.0–17.7)
Immature Grans (Abs): 0.1 10*3/uL (ref 0.0–0.1)
Immature Granulocytes: 1 %
Lymphocytes Absolute: 1.3 10*3/uL (ref 0.7–3.1)
Lymphs: 12 %
MCH: 25.4 pg — ABNORMAL LOW (ref 26.6–33.0)
MCHC: 31.1 g/dL — ABNORMAL LOW (ref 31.5–35.7)
MCV: 82 fL (ref 79–97)
Monocytes Absolute: 0.8 10*3/uL (ref 0.1–0.9)
Monocytes: 7 %
Neutrophils Absolute: 8.6 10*3/uL — ABNORMAL HIGH (ref 1.4–7.0)
Neutrophils: 78 %
Platelets: 232 10*3/uL (ref 150–450)
RBC: 2.91 x10E6/uL — ABNORMAL LOW (ref 4.14–5.80)
RDW: 14.4 % (ref 11.6–15.4)
WBC: 11 10*3/uL — ABNORMAL HIGH (ref 3.4–10.8)

## 2020-09-25 LAB — BASIC METABOLIC PANEL
BUN/Creatinine Ratio: 15 (ref 10–24)
BUN: 21 mg/dL (ref 8–27)
CO2: 23 mmol/L (ref 20–29)
Calcium: 8.8 mg/dL (ref 8.6–10.2)
Chloride: 103 mmol/L (ref 96–106)
Creatinine, Ser: 1.42 mg/dL — ABNORMAL HIGH (ref 0.76–1.27)
GFR calc Af Amer: 59 mL/min/{1.73_m2} — ABNORMAL LOW (ref >59)
GFR calc non Af Amer: 51 mL/min/{1.73_m2} — ABNORMAL LOW (ref >59)
Glucose: 101 mg/dL — ABNORMAL HIGH (ref 65–99)
Potassium: 4.6 mmol/L (ref 3.5–5.2)
Sodium: 140 mmol/L (ref 134–144)

## 2020-09-26 ENCOUNTER — Telehealth: Payer: Self-pay | Admitting: Physician Assistant

## 2020-09-26 NOTE — Telephone Encounter (Signed)
Reached out to the patient again, he is requesting a antibiotics for a bladder infection. I recommended check that with his PCP or urologist.

## 2020-09-26 NOTE — Telephone Encounter (Signed)
Received page from patient regarding pain due to kidney infection, attempted to call the patient twice, both went straight into voicemail. Will try to call him again in 30 min.

## 2020-09-28 NOTE — Progress Notes (Deleted)
[facility] [place]                                       Cardiology Office Note    Date:  09/28/2020   ID:  [name], DOB 1955-12-17, MRN [id number]  PCP:  [name], [name], MD  Cardiologist: Werner Lean, MD / Dr. Buena Irish & Dr. Cyndia Bent (TAVR)  CC: 1 month s/p TAVR  History of Present Illness:  Lawrence Rose is a 64 y.o. male with a history of HTN, HLD, anemia,morbid obesity,chronic back and knee pain with limited mobility,OSAon CPAP, CKD stage III, pulmonary HTN, sick euthyroid syndrome, mild MS and severe aortic stenosis s/p TAVR (09/01/20) who presents to clinic for follow up.   The patient was recently admitted for acute heart failure requiring IV diuresis.Transthoracic echocardiogram showed EF 60 to 65%, moderate concentric LVH, indeterminate diastolic function, severe left atrial dilation, mild mitral stenosis (mean gradient of 6 mm Hg at HR of 101bpm), severe aortic valve stenosis with anAVA0.9 cm, mean gradient 51 mmHg, peak gradient 82 mmHg, V-max 4.65 cm, DVI 0.2. He underwent left and right heart catheterization on 08/18/2020 which showed widely patent coronary arteries, high cardiac output at 9.06 L/min by Fick and 9.23 L/min by thermal dilution,severe pulmonary hypertension with a mean pressure of 48 mmHg, pulmonary capillary wedge pressure mean 34 mmHg, pulmonary vascular resistance 1.55 Woods units, mean right atrial pressure 19 mmHg, aortic valve peak gradient of 33 mmHg, mean gradient 22 mmHg, aortic valve area 1.94 cm by Fick and 1.98 cm by thermal dilution, LVEDP 20 mmHg. Further diuresis was recommended. There was some question of hishigh cardiac output being related to possible thyrotoxicosis as he was noted to have a suppressed TSH with a normal T4.He underwent full TAVR work-up during his admission and felt to be a good TAVR candidate at that time. However, it was recommended that this be delayed  given acute kidney injury secondary to diuresis and contrast dye studies  He was evaluated by the multidisciplinary valve team and underwent successful TAVR with a56m Medtronic Evolut Pro+THV via the TF approach on11/16/21. Post operative echoshowed EF >75%, normally functioning TAVR with a mean gradient of 17 mmHg and no PVL. He had mild AKI with creat 0.9--> 1.34. He was discharged on aspirin and plavix.   At one week follow up he was noted to be hypotensive, tachycardic and there was diffuse ST elevation on ECG. Stat labs showed mild AKI and slight worsening of his chronic anemia. HS trop negative. Stat echo normal aside from slight decrease in EF from >75%--> 55%. Interestingly, the patient was feeling very well with no complaints. His lisinopril, lasix and potassium were held.   He presented for close follow up last week. He continued to be asymptomatic. Repeat ECG showed continued tachycardia. This was reviewed with Dr. NJohnsie Canceland Dr. COwens Sharkand felt to be atrial flutter with RVR. He was started on amiodarone load and Eliquis and DAPT stopped. Follow up labs show worsening anemia with hg of 7.4. Creat remained elevated but stable at 1.42. He was told to hold Eliquis and contact PCP for FOBT and anemia work up.  Today he presents to clinic for follow up.     Past Medical History:  Diagnosis Date  . Acid reflux   . Allergic arthritis, hand    per patient, both hands  .  Apnea   . Asthma   . Carpal tunnel syndrome    per patient, both hands  . Chronic back pain   . Chronic knee pain   . Diabetes mellitus without complication ([place])   . Hypertension   . 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 Angelena Form and Dr. Cyndia Bent.   . Severe aortic stenosis   . Sleep apnea    wears CPAP every night    Past Surgical History:  Procedure Laterality Date  . BACK SURGERY    . RIGHT/LEFT HEART  CATH AND CORONARY ANGIOGRAPHY N/A 08/18/2020   Procedure: RIGHT/LEFT HEART CATH AND CORONARY ANGIOGRAPHY;  Surgeon: [name], MD;  Location: [place] [facility];  Service: Cardiovascular;  Laterality: N/A;  . TEE WITHOUT CARDIOVERSION N/A 08/21/2020   Procedure: TRANSESOPHAGEAL ECHOCARDIOGRAM (TEE);  Surgeon: [name], MD;  Location: [facility];  Service: Cardiovascular;  Laterality: N/A;  . TEE WITHOUT CARDIOVERSION N/A 09/01/2020   Procedure: TRANSESOPHAGEAL ECHOCARDIOGRAM (TEE);  Surgeon: [name], MD;  Location: [place] [facility];  Service: Open Heart Surgery;  Laterality: N/A;  . TRANSCATHETER AORTIC VALVE REPLACEMENT, TRANSFEMORAL N/A 09/01/2020   Procedure: TRANSCATHETER AORTIC VALVE REPLACEMENT, TRANSFEMORAL;  Surgeon: [name], MD;  Location: [place] [facility];  Service: Open Heart Surgery;  Laterality: N/A;    Current Medications: Outpatient Medications Prior to Visit  Medication Sig Dispense Refill  . albuterol (VENTOLIN HFA) 108 (90 Base) MCG/ACT inhaler Inhale 2 puffs into the lungs every 4 (four) hours as needed for wheezing or shortness of breath.     Marland Kitchen amiodarone (PACERONE) 200 MG tablet Take 2 tablets (400 mg total) by mouth 2 (two) times daily. 120 tablet 0  . apixaban (ELIQUIS) 5 MG TABS tablet Take 1 tablet (5 mg total) by mouth 2 (two) times daily. 180 tablet 3  . Blood Glucose Monitoring Suppl (FIFTY50 GLUCOSE METER 2.0) w/Device KIT See admin instructions.    . Cholecalciferol 25 MCG (1000 UT) tablet Take 1,000 Units by mouth at bedtime.     Marland Kitchen diltiazem (CARDIZEM CD) 240 MG 24 hr capsule Take 1 capsule by mouth.    . gabapentin (NEURONTIN) 300 MG capsule Take 600 mg by mouth 2 (two) times daily.     Marland Kitchen HYDROcodone-acetaminophen (NORCO) 10-325 MG tablet Take 1 tablet by mouth every 6 (six) hours as needed for moderate pain.     Marland Kitchen latanoprost (XALATAN) 0.005 % ophthalmic solution Place 1 drop into both eyes at bedtime.     .  metFORMIN (GLUCOPHAGE) 1000 MG tablet Take 1,000 mg by mouth 2 (two) times daily.    . metoprolol tartrate (LOPRESSOR) 25 MG tablet Take 1 tablet (25 mg total) by mouth 2 (two) times daily. 60 tablet 3  . Multiple Vitamin (MULTIVITAMIN WITH MINERALS) TABS tablet Take 1 tablet by mouth at bedtime.     Marland Kitchen oxyCODONE ER (XTAMPZA ER) 9 MG C12A Take 9 mg by mouth every 12 (twelve) hours.    . pantoprazole (PROTONIX) 40 MG tablet Take 40 mg by mouth 2 (two) times daily.    Marland Kitchen PRESCRIPTION MEDICATION Inhale into the lungs at bedtime. CPAP    . rosuvastatin (CRESTOR) 20 MG tablet Take 20 mg by mouth daily.      No facility-administered medications prior to visit.     Allergies:   Cocoa, Sulfamethoxazole-trimethoprim, Chocolate, Citrus, Fish allergy, Shellfish allergy, Sulfa  antibiotics, and Tetracycline   Social History   Socioeconomic History  . Marital status: Single    Spouse name: Not on file  . Number of children: Not on file  . Years of education: Not on file  . Highest education level: Not on file  Occupational History  . Not on file  Tobacco Use  . Smoking status: Former Research scientist (life sciences)  . Smokeless tobacco: Never Used  . Tobacco comment: per patient quit over 30 years ago  Vaping Use  . Vaping Use: Never used  Substance and Sexual Activity  . Alcohol use: No  . Drug use: Not Currently  . Sexual activity: Not on file  Other Topics Concern  . Not on file  Social History Narrative  . Not on file   Social Determinants of Health   Financial Resource Strain: Not on file  Food Insecurity: Not on file  Transportation Needs: Not on file  Physical Activity: Not on file  Stress: Not on file  Social Connections: Not on file     Family History:  The patient'sfamily history is not on file.     ROS:   Please see the history of present illness.    ROS All other systems reviewed and are negative.   PHYSICAL EXAM:   VS:  There were no vitals taken for this visit.   GEN: Well nourished,  well developed, in no acute distress, morbidly obese HEENT: normal Neck: no JVD or masses Cardiac: RR tachy; no murmurs, rubs, or gallops,no edema  Respiratory:  clear to auscultation bilaterally, normal work of breathing GI: soft, nontender, nondistended, + BS MS: no deformity or atrophy Skin: warm and dry, no rash    Neuro:  Alert and Oriented x 3, Strength and sensation are intact Psych: euthymic mood, full affect   Wt Readings from Last 3 Encounters:  09/24/20 (!) 315 lb (142.9 kg)  09/16/20 (!) 316 lb 3.2 oz (143.4 kg)  09/02/20 (!) 308 lb 8 oz (139.9 kg)      Studies/Labs Reviewed:   EKG:  EKG is ordered today.  The ekg ordered today demonstrates atrial flutter with saw tooth pattern, HR 136   Recent Labs: 08/16/2020: TSH 0.176 08/27/2020: ALT 18; B Natriuretic Peptide 48.3 09/02/2020: Magnesium 2.0 09/24/2020: BUN 21; Creatinine, Ser 1.42; Hemoglobin 7.4; Platelets 232; Potassium 4.6; Sodium 140   Lipid Panel    Component Value Date/Time   CHOL 127 08/17/2020 0351   TRIG 69 08/17/2020 0351   HDL 39 (L) 08/17/2020 0351   CHOLHDL 3.3 08/17/2020 0351   VLDL 14 08/17/2020 0351   LDLCALC 74 08/17/2020 0351    Additional studies/ records that were reviewed today include:  TAVR OPERATIVE NOTE  Lawrence Rose [id number]  Date of Procedure:09/01/2020  Preoperative Diagnosis:Severe Aortic Stenosis   Postoperative Diagnosis:Same   Procedure:   Transcatheter Aortic Valve Replacement - Percutaneous RightTransfemoral Approach Medtronic Evolut-Pro+(size1m, serial # [id number]  Co-Surgeons:Bryan KAlveria Apley MD andChristopher MAngelena Form MD   Anesthesiologist:Robert FOla Spurr MD  EDala Dock MD  Pre-operative Echo Findings: ? Severe aortic stenosis and moderate AI ? Normalleft ventricular systolic  function  Post-operative Echo Findings: ? Noparavalvular leak ? Normalleft ventricular systolic function  _____________   Echo 09/02/20: IMPRESSIONS  1. Left ventricular ejection fraction, by estimation, is >75%. The left  ventricle has hyperdynamic function. The left ventricle has no regional  wall motion abnormalities.  2. Right ventricular systolic function is normal. The right ventricular  size is normal.  3. The mitral valve is degenerative.  Trivial mitral valve regurgitation.  No evidence of mitral stenosis. Severe mitral annular calcification.  4. Post TAVR using 29 mm Evolut Pro Medtronic supra annlular valve.  Imaging is sub otpimal very trivial PVL not localized by SA images.  Gradients on non imaging pedoff SS notch images seem high. I measure peak  velocity 1.5 m/sec mean gradient 5 mmHg  peak 9 mmHg. This is in setting of very hyperdynamic LV fucnton with LVOT  velocity 1.3 m/sec. AVA 2.1 cm2 and DVI 0.5 . The aortic valve has been  repaired/replaced. Aortic valve regurgitation is trivial. No aortic  stenosis is present.  5. The inferior vena cava is normal in size with greater than 50%  respiratory variability, suggesting right atrial pressure of 3 mmHg.    ______________________  Stat limited echo 09/16/20 IMPRESSIONS  1. Left ventricular ejection fraction, by estimation, is 55 to 60%. The  left ventricle has normal function. The left ventricle has no regional  wall motion abnormalities. There is mild concentric left ventricular  hypertrophy. Indeterminate diastolic  filling due to E-A fusion.  2. Right ventricular systolic function is normal. The right ventricular  size is normal. Tricuspid regurgitation signal is inadequate for assessing  PA pressure.  3. The mitral valve is degenerative. No evidence of mitral valve  regurgitation. No evidence of mitral stenosis. The mean mitral valve  gradient is 3.0 mmHg with average heart rate of 130 bpm.   4. The aortic valve has been repaired/replaced. Aortic valve  regurgitation is not visualized. There is a CoreValve-Evolut Pro  prosthetic (TAVR) valve present in the aortic position. Procedure Date:  09/01/2020. Echo findings are consistent with normal  structure and function of the aortic valve prosthesis.  5. The inferior vena cava is normal in size with greater than 50%  respiratory variability, suggesting right atrial pressure of 3 mmHg.   Comparison(s): Prior images reviewed side by side. The left ventricular systolic function is substantially lower compared to 2 weeks earlier, but remains in normal range.   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 Correlation with nonemergent abdominal MRI with and without IV gadolinium is recommended in the near future to definitively characterize this lesion and exclude neoplasm. This was not discussed today.   Medication Adjustments/Labs and Tests Ordered: Current medicines are reviewed at length with the patient today.  Concerns regarding medicines are outlined above.  Medication changes, Labs and Tests ordered today are listed in the Patient Instructions below. There are no Patient Instructions on file for this visit.   Signed, Angelena Form, PA-C  09/28/2020 [facility] [place], [place], [place]  [address] Phone: [phone]; Fax: [phone]

## 2020-09-29 ENCOUNTER — Inpatient Hospital Stay (HOSPITAL_COMMUNITY)
Admission: EM | Admit: 2020-09-29 | Discharge: 2020-10-02 | DRG: 378 | Disposition: A | Discharge status: Home / Self Care | Payer: Medicare Other | Attending: Internal Medicine | Admitting: Internal Medicine

## 2020-09-29 ENCOUNTER — Encounter (HOSPITAL_COMMUNITY): Payer: Self-pay

## 2020-09-29 ENCOUNTER — Other Ambulatory Visit: Payer: Self-pay

## 2020-09-29 DIAGNOSIS — N179 Acute kidney failure, unspecified: Secondary | ICD-10-CM | POA: Diagnosis not present

## 2020-09-29 DIAGNOSIS — E0781 Sick-euthyroid syndrome: Secondary | ICD-10-CM | POA: Diagnosis present

## 2020-09-29 DIAGNOSIS — K254 Chronic or unspecified gastric ulcer with hemorrhage: Principal | ICD-10-CM | POA: Diagnosis present

## 2020-09-29 DIAGNOSIS — I272 Pulmonary hypertension, unspecified: Secondary | ICD-10-CM | POA: Diagnosis not present

## 2020-09-29 DIAGNOSIS — Z20822 Contact with and (suspected) exposure to covid-19: Secondary | ICD-10-CM | POA: Diagnosis not present

## 2020-09-29 DIAGNOSIS — E1122 Type 2 diabetes mellitus with diabetic chronic kidney disease: Secondary | ICD-10-CM | POA: Diagnosis not present

## 2020-09-29 DIAGNOSIS — Z7984 Long term (current) use of oral hypoglycemic drugs: Secondary | ICD-10-CM

## 2020-09-29 DIAGNOSIS — E66813 Obesity, class 3: Secondary | ICD-10-CM | POA: Diagnosis present

## 2020-09-29 DIAGNOSIS — N1831 Chronic kidney disease, stage 3a: Secondary | ICD-10-CM | POA: Diagnosis not present

## 2020-09-29 DIAGNOSIS — Z79899 Other long term (current) drug therapy: Secondary | ICD-10-CM

## 2020-09-29 DIAGNOSIS — Z953 Presence of xenogenic heart valve: Secondary | ICD-10-CM

## 2020-09-29 DIAGNOSIS — E669 Obesity, unspecified: Secondary | ICD-10-CM

## 2020-09-29 DIAGNOSIS — Z881 Allergy status to other antibiotic agents status: Secondary | ICD-10-CM

## 2020-09-29 DIAGNOSIS — Z7982 Long term (current) use of aspirin: Secondary | ICD-10-CM

## 2020-09-29 DIAGNOSIS — N183 Chronic kidney disease, stage 3 unspecified: Secondary | ICD-10-CM | POA: Diagnosis present

## 2020-09-29 DIAGNOSIS — E1169 Type 2 diabetes mellitus with other specified complication: Secondary | ICD-10-CM

## 2020-09-29 DIAGNOSIS — Z7901 Long term (current) use of anticoagulants: Secondary | ICD-10-CM | POA: Diagnosis not present

## 2020-09-29 DIAGNOSIS — G4733 Obstructive sleep apnea (adult) (pediatric): Secondary | ICD-10-CM | POA: Diagnosis not present

## 2020-09-29 DIAGNOSIS — M549 Dorsalgia, unspecified: Secondary | ICD-10-CM | POA: Diagnosis not present

## 2020-09-29 DIAGNOSIS — I129 Hypertensive chronic kidney disease with stage 1 through stage 4 chronic kidney disease, or unspecified chronic kidney disease: Secondary | ICD-10-CM | POA: Diagnosis present

## 2020-09-29 DIAGNOSIS — Z87891 Personal history of nicotine dependence: Secondary | ICD-10-CM

## 2020-09-29 DIAGNOSIS — M25569 Pain in unspecified knee: Secondary | ICD-10-CM | POA: Diagnosis not present

## 2020-09-29 DIAGNOSIS — I480 Paroxysmal atrial fibrillation: Secondary | ICD-10-CM | POA: Diagnosis present

## 2020-09-29 DIAGNOSIS — G8929 Other chronic pain: Secondary | ICD-10-CM | POA: Diagnosis not present

## 2020-09-29 DIAGNOSIS — I4892 Unspecified atrial flutter: Secondary | ICD-10-CM

## 2020-09-29 DIAGNOSIS — D649 Anemia, unspecified: Secondary | ICD-10-CM | POA: Diagnosis present

## 2020-09-29 DIAGNOSIS — Z6841 Body Mass Index (BMI) 40.0 and over, adult: Secondary | ICD-10-CM | POA: Diagnosis not present

## 2020-09-29 DIAGNOSIS — K219 Gastro-esophageal reflux disease without esophagitis: Secondary | ICD-10-CM | POA: Diagnosis present

## 2020-09-29 DIAGNOSIS — D62 Acute posthemorrhagic anemia: Secondary | ICD-10-CM | POA: Diagnosis present

## 2020-09-29 DIAGNOSIS — K922 Gastrointestinal hemorrhage, unspecified: Secondary | ICD-10-CM | POA: Diagnosis present

## 2020-09-29 DIAGNOSIS — R195 Other fecal abnormalities: Secondary | ICD-10-CM

## 2020-09-29 DIAGNOSIS — Z882 Allergy status to sulfonamides status: Secondary | ICD-10-CM

## 2020-09-29 DIAGNOSIS — Z8719 Personal history of other diseases of the digestive system: Secondary | ICD-10-CM

## 2020-09-29 DIAGNOSIS — Z86010 Personal history of colonic polyps: Secondary | ICD-10-CM

## 2020-09-29 DIAGNOSIS — K5909 Other constipation: Secondary | ICD-10-CM | POA: Diagnosis not present

## 2020-09-29 DIAGNOSIS — E785 Hyperlipidemia, unspecified: Secondary | ICD-10-CM | POA: Diagnosis present

## 2020-09-29 HISTORY — DX: Cardiac arrhythmia, unspecified: I49.9

## 2020-09-29 HISTORY — DX: Chronic kidney disease, unspecified: N18.9

## 2020-09-29 HISTORY — DX: Anemia, unspecified: D64.9

## 2020-09-29 LAB — BASIC METABOLIC PANEL
Anion gap: 9 (ref 5–15)
BUN: 21 mg/dL (ref 8–23)
CO2: 25 mmol/L (ref 22–32)
Calcium: 9.2 mg/dL (ref 8.9–10.3)
Chloride: 102 mmol/L (ref 98–111)
Creatinine, Ser: 1.57 mg/dL — ABNORMAL HIGH (ref 0.61–1.24)
GFR, Estimated: 48 mL/min — ABNORMAL LOW (ref >60)
Glucose, Bld: 148 mg/dL — ABNORMAL HIGH (ref 70–99)
Potassium: 4.3 mmol/L (ref 3.5–5.1)
Sodium: 136 mmol/L (ref 135–145)

## 2020-09-29 LAB — CBC
HCT: 26.2 % — ABNORMAL LOW (ref 39.0–52.0)
Hemoglobin: 7.4 g/dL — ABNORMAL LOW (ref 13.0–17.0)
MCH: 24.7 pg — ABNORMAL LOW (ref 26.0–34.0)
MCHC: 28.2 g/dL — ABNORMAL LOW (ref 30.0–36.0)
MCV: 87.6 fL (ref 80.0–100.0)
Platelets: 293 10*3/uL (ref 150–400)
RBC: 2.99 MIL/uL — ABNORMAL LOW (ref 4.22–5.81)
RDW: 16.1 % — ABNORMAL HIGH (ref 11.5–15.5)
WBC: 8.1 10*3/uL (ref 4.0–10.5)
nRBC: 0 % (ref 0.0–0.2)

## 2020-09-29 NOTE — ED Triage Notes (Signed)
Pt comes by POV for eval of abnormal lab/anemia. Per pt, was seen by [facility] provider & got a call saying that "blood count was low." Denies lightheadedness, N/V/D, dark/tarry stools, endorses taking baby aspirin. States that he had a similar problem approx 13yrs ago, received a iron transfusion.

## 2020-09-30 ENCOUNTER — Ambulatory Visit: Payer: Medicare HMO | Admitting: Physician Assistant

## 2020-09-30 ENCOUNTER — Encounter (HOSPITAL_COMMUNITY): Payer: Self-pay | Admitting: Family Medicine

## 2020-09-30 ENCOUNTER — Inpatient Hospital Stay (HOSPITAL_COMMUNITY): Payer: Medicare Other

## 2020-09-30 ENCOUNTER — Other Ambulatory Visit (HOSPITAL_COMMUNITY): Payer: Medicare HMO

## 2020-09-30 DIAGNOSIS — Z6841 Body Mass Index (BMI) 40.0 and over, adult: Secondary | ICD-10-CM | POA: Diagnosis not present

## 2020-09-30 DIAGNOSIS — D509 Iron deficiency anemia, unspecified: Secondary | ICD-10-CM

## 2020-09-30 DIAGNOSIS — N183 Chronic kidney disease, stage 3 unspecified: Secondary | ICD-10-CM

## 2020-09-30 DIAGNOSIS — N179 Acute kidney failure, unspecified: Secondary | ICD-10-CM | POA: Diagnosis not present

## 2020-09-30 DIAGNOSIS — Z952 Presence of prosthetic heart valve: Secondary | ICD-10-CM | POA: Diagnosis not present

## 2020-09-30 DIAGNOSIS — E1169 Type 2 diabetes mellitus with other specified complication: Secondary | ICD-10-CM

## 2020-09-30 DIAGNOSIS — Z881 Allergy status to other antibiotic agents status: Secondary | ICD-10-CM | POA: Diagnosis not present

## 2020-09-30 DIAGNOSIS — K922 Gastrointestinal hemorrhage, unspecified: Secondary | ICD-10-CM | POA: Diagnosis present

## 2020-09-30 DIAGNOSIS — K5909 Other constipation: Secondary | ICD-10-CM | POA: Diagnosis not present

## 2020-09-30 DIAGNOSIS — K2960 Other gastritis without bleeding: Secondary | ICD-10-CM | POA: Diagnosis not present

## 2020-09-30 DIAGNOSIS — G8929 Other chronic pain: Secondary | ICD-10-CM | POA: Diagnosis not present

## 2020-09-30 DIAGNOSIS — K219 Gastro-esophageal reflux disease without esophagitis: Secondary | ICD-10-CM | POA: Diagnosis not present

## 2020-09-30 DIAGNOSIS — G4733 Obstructive sleep apnea (adult) (pediatric): Secondary | ICD-10-CM

## 2020-09-30 DIAGNOSIS — K254 Chronic or unspecified gastric ulcer with hemorrhage: Secondary | ICD-10-CM | POA: Diagnosis not present

## 2020-09-30 DIAGNOSIS — M549 Dorsalgia, unspecified: Secondary | ICD-10-CM | POA: Diagnosis not present

## 2020-09-30 DIAGNOSIS — Z882 Allergy status to sulfonamides status: Secondary | ICD-10-CM | POA: Diagnosis not present

## 2020-09-30 DIAGNOSIS — Z7901 Long term (current) use of anticoagulants: Secondary | ICD-10-CM

## 2020-09-30 DIAGNOSIS — Z20822 Contact with and (suspected) exposure to covid-19: Secondary | ICD-10-CM | POA: Diagnosis not present

## 2020-09-30 DIAGNOSIS — I4892 Unspecified atrial flutter: Secondary | ICD-10-CM

## 2020-09-30 DIAGNOSIS — I272 Pulmonary hypertension, unspecified: Secondary | ICD-10-CM | POA: Diagnosis not present

## 2020-09-30 DIAGNOSIS — I480 Paroxysmal atrial fibrillation: Secondary | ICD-10-CM | POA: Diagnosis not present

## 2020-09-30 DIAGNOSIS — E0781 Sick-euthyroid syndrome: Secondary | ICD-10-CM | POA: Diagnosis not present

## 2020-09-30 DIAGNOSIS — Z953 Presence of xenogenic heart valve: Secondary | ICD-10-CM | POA: Diagnosis not present

## 2020-09-30 DIAGNOSIS — E785 Hyperlipidemia, unspecified: Secondary | ICD-10-CM | POA: Diagnosis not present

## 2020-09-30 DIAGNOSIS — N1831 Chronic kidney disease, stage 3a: Secondary | ICD-10-CM | POA: Diagnosis not present

## 2020-09-30 DIAGNOSIS — I129 Hypertensive chronic kidney disease with stage 1 through stage 4 chronic kidney disease, or unspecified chronic kidney disease: Secondary | ICD-10-CM | POA: Diagnosis not present

## 2020-09-30 DIAGNOSIS — D62 Acute posthemorrhagic anemia: Secondary | ICD-10-CM | POA: Diagnosis not present

## 2020-09-30 DIAGNOSIS — M25569 Pain in unspecified knee: Secondary | ICD-10-CM | POA: Diagnosis not present

## 2020-09-30 DIAGNOSIS — E1122 Type 2 diabetes mellitus with diabetic chronic kidney disease: Secondary | ICD-10-CM | POA: Diagnosis not present

## 2020-09-30 DIAGNOSIS — Z9989 Dependence on other enabling machines and devices: Secondary | ICD-10-CM

## 2020-09-30 LAB — PREPARE RBC (CROSSMATCH)

## 2020-09-30 LAB — ECHOCARDIOGRAM COMPLETE
AR max vel: 2.05 cm2
AV Area VTI: 2.4 cm2
AV Area mean vel: 2.23 cm2
AV Mean grad: 7 mmHg
AV Peak grad: 14.1 mmHg
Ao pk vel: 1.88 m/s
Area-P 1/2: 4.49 cm2
S' Lateral: 3.3 cm
Single Plane A4C EF: 60.8 %

## 2020-09-30 LAB — HEMOGLOBIN AND HEMATOCRIT, BLOOD
HCT: 26.9 % — ABNORMAL LOW (ref 39.0–52.0)
Hemoglobin: 7.9 g/dL — ABNORMAL LOW (ref 13.0–17.0)

## 2020-09-30 LAB — RESP PANEL BY RT-PCR (FLU A&B, COVID) ARPGX2
Influenza A by PCR: NEGATIVE
Influenza B by PCR: NEGATIVE
SARS Coronavirus 2 by RT PCR: NEGATIVE

## 2020-09-30 LAB — POC OCCULT BLOOD, ED: Fecal Occult Bld: POSITIVE — AB

## 2020-09-30 MED ORDER — LATANOPROST 0.005 % OP SOLN
1.0000 [drp] | Freq: Every day | OPHTHALMIC | Status: DC
  Administered 2020-09-30: 22:00:00 1 [drp] via OPHTHALMIC
  Filled 2020-09-30 (×2): qty 2.5

## 2020-09-30 MED ORDER — ACETAMINOPHEN 325 MG PO TABS
650.0000 mg | ORAL_TABLET | Freq: Four times a day (QID) | ORAL | Status: DC | PRN
  Administered 2020-09-30 – 2020-10-01 (×2): 650 mg via ORAL
  Filled 2020-09-30 (×2): qty 2

## 2020-09-30 MED ORDER — ROSUVASTATIN CALCIUM 20 MG PO TABS
20.0000 mg | ORAL_TABLET | Freq: Every day | ORAL | Status: DC
  Administered 2020-09-30 – 2020-10-02 (×3): 20 mg via ORAL
  Filled 2020-09-30 (×3): qty 1

## 2020-09-30 MED ORDER — ALBUTEROL SULFATE (2.5 MG/3ML) 0.083% IN NEBU: 2.5000 mg | INHALATION_SOLUTION | RESPIRATORY_TRACT | Status: DC | PRN

## 2020-09-30 MED ORDER — GABAPENTIN 300 MG PO CAPS
600.0000 mg | ORAL_CAPSULE | Freq: Two times a day (BID) | ORAL | Status: DC
  Administered 2020-09-30 – 2020-10-02 (×5): 600 mg via ORAL
  Filled 2020-09-30 (×5): qty 2

## 2020-09-30 MED ORDER — PERFLUTREN LIPID MICROSPHERE
1.0000 mL | INTRAVENOUS | Status: AC | PRN
  Administered 2020-09-30: 2 mL via INTRAVENOUS
  Filled 2020-09-30: qty 10

## 2020-09-30 MED ORDER — DILTIAZEM HCL ER COATED BEADS 240 MG PO CP24
240.0000 mg | ORAL_CAPSULE | Freq: Every day | ORAL | Status: DC
  Administered 2020-09-30 – 2020-10-02 (×3): 240 mg via ORAL
  Filled 2020-09-30 (×2): qty 1
  Filled 2020-09-30: qty 2
  Filled 2020-09-30: qty 1

## 2020-09-30 MED ORDER — SODIUM CHLORIDE 0.9 % IV SOLN
10.0000 mL/h | Freq: Once | INTRAVENOUS | Status: AC
  Administered 2020-09-30: 06:00:00 10 mL/h via INTRAVENOUS

## 2020-09-30 MED ORDER — LACTATED RINGERS IV SOLN: INTRAVENOUS | Status: DC

## 2020-09-30 MED ORDER — SODIUM CHLORIDE 0.9 % IV SOLN: 10.0000 mL/h | Freq: Once | INTRAVENOUS | Status: DC

## 2020-09-30 MED ORDER — METOPROLOL TARTRATE 25 MG PO TABS
25.0000 mg | ORAL_TABLET | Freq: Two times a day (BID) | ORAL | Status: DC
  Administered 2020-09-30 – 2020-10-02 (×5): 25 mg via ORAL
  Filled 2020-09-30 (×5): qty 1

## 2020-09-30 MED ORDER — PANTOPRAZOLE SODIUM 40 MG PO TBEC
40.0000 mg | DELAYED_RELEASE_TABLET | Freq: Two times a day (BID) | ORAL | Status: DC
  Administered 2020-09-30 – 2020-10-02 (×5): 40 mg via ORAL
  Filled 2020-09-30 (×5): qty 1

## 2020-09-30 MED ORDER — SODIUM CHLORIDE 0.9% IV SOLUTION: Freq: Once | INTRAVENOUS | Status: AC

## 2020-09-30 MED ORDER — ACETAMINOPHEN 650 MG RE SUPP: 650.0000 mg | Freq: Four times a day (QID) | RECTAL | Status: DC | PRN

## 2020-09-30 MED ORDER — METFORMIN HCL 500 MG PO TABS: 1000.0000 mg | ORAL_TABLET | Freq: Two times a day (BID) | ORAL | Status: DC

## 2020-09-30 MED ORDER — AMIODARONE HCL 200 MG PO TABS
400.0000 mg | ORAL_TABLET | Freq: Two times a day (BID) | ORAL | Status: DC
  Administered 2020-09-30 – 2020-10-02 (×5): 400 mg via ORAL
  Filled 2020-09-30 (×5): qty 2

## 2020-09-30 MED ORDER — ALBUTEROL SULFATE HFA 108 (90 BASE) MCG/ACT IN AERS: 2.0000 | INHALATION_SPRAY | RESPIRATORY_TRACT | Status: DC | PRN

## 2020-09-30 NOTE — Progress Notes (Signed)
°  Echocardiogram 2D Echocardiogram has been performed.  Lawrence Rose 09/30/2020, 3:23 PM

## 2020-09-30 NOTE — H&P (View-Only) (Signed)
[facility] Consult: 9:51 AM 09/30/2020  LOS: 0 days    Referring Provider: Dr Mal Misty Primary Care Physician:  [name], [name], MD  [facility] teaching service.   Primary Gastroenterologist:  Dr. Mitchell Heir in [facility].    Reason for Consultation: Anemia.   HPI: Lawrence Rose is a 64 y.o. male.  PMH hypertension.  Hyperlipidemia.  Morbid obesity.  Hyperlipidemia.  Chronic back/knee pain, limited mobility.  OSA on CPAP.  CKD stage 3.  Pulmonary hypertension.  Sick euthyroid syndrome.  Mild mitral valve and severe aortic valve stenosis.  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  Atrial flutter at cardiology appointment 1 week ago.  Cardiologist stopped DAPT and started Eliquis. A hematologist, Dr. Dustin Folks in [facility] follows him for IDA.  At office visit of 07/02/2020 patient was doing well and anemia labs obtained. Hgb 10.4, MCV 78 on 08/28/2020  Has had a total of three previous screening colonoscopy.  "Benign polyps" on his second colonoscopy around 2010. 2004 EGD: Erosive antral gastritis, erosive bulbar duodenitis.  Small, sliding hiatal hernia. 12/2015 colonoscopy. Dr Phineas Douglas in [facility]  Screening study.  Benign-appearing, 2 to 3 mm sized, polyps removed from the transverse colon.  Rare sigmoid diverticulosis.  Moderate internal hemorrhoids, not bleeding.  Unable to find pathology report in Care Everywhere. 10/2017 Capsule endoscopy.  Per Dr Mitchell Heir.  Unable to access report in Care Everywhere.    Routine outpatient labs revealed Hgb 7.4, down from 10.9.  FOBT positive.  Sent to the ED and now admitted for further work-up. Patient denies any symptoms related to the anemia such as new or progressive short of breath, increased fatigue.  No bloody or dark stools.  No nausea or vomiting, no heartburn.  No use of NSAIDs but does  take low-dose aspirin daily. Last dose of apixaban was 12/14.  Compliant with Protonix 40 bid.   He has tachycardia but has not noticed any fast heart rate or chest pressure/pain.  Usual or excessive bleeding or bruising.  Has been on a portion controlled diet and managed to lose 50 pounds in the last year or so.  Social history No alcohol.     Past Medical History:  Diagnosis Date  . Acid reflux   . Allergic arthritis, hand    per patient, both hands  . Anemia   . Apnea   . Asthma   . Carpal tunnel syndrome    per patient, both hands  . Chronic back pain   . Chronic kidney disease   . Chronic knee pain   . Diabetes mellitus without complication ([place])   . Dysrhythmia   . Hypertension   . 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 Angelena Form and Dr. Cyndia Bent.   . Severe aortic stenosis   . Sleep apnea    wears CPAP every night    Past Surgical History:  Procedure Laterality Date  . BACK SURGERY    . RIGHT/LEFT HEART CATH AND CORONARY ANGIOGRAPHY N/A  08/18/2020   Procedure: RIGHT/LEFT HEART CATH AND CORONARY ANGIOGRAPHY;  Surgeon: [name], MD;  Location: [place] [facility];  Service: Cardiovascular;  Laterality: N/A;  . TEE WITHOUT CARDIOVERSION N/A 08/21/2020   Procedure: TRANSESOPHAGEAL ECHOCARDIOGRAM (TEE);  Surgeon: [name], MD;  Location: [facility] ENDOSCOPY;  Service: Cardiovascular;  Laterality: N/A;  . TEE WITHOUT CARDIOVERSION N/A 09/01/2020   Procedure: TRANSESOPHAGEAL ECHOCARDIOGRAM (TEE);  Surgeon: [name], MD;  Location: [place] [facility];  Service: Open Heart Surgery;  Laterality: N/A;  . TRANSCATHETER AORTIC VALVE REPLACEMENT, TRANSFEMORAL N/A 09/01/2020   Procedure: TRANSCATHETER AORTIC VALVE REPLACEMENT, TRANSFEMORAL;  Surgeon: [name], MD;  Location: [place] [facility];  Service: Open Heart Surgery;  Laterality: N/A;    Prior to  Admission medications   Medication Sig Start Date End Date Taking? Authorizing Provider  albuterol (VENTOLIN HFA) 108 (90 Base) MCG/ACT inhaler Inhale 2 puffs into the lungs every 4 (four) hours as needed for wheezing or shortness of breath.  08/22/16  Yes [provider]  amiodarone (PACERONE) 200 MG tablet Take 2 tablets (400 mg total) by mouth 2 (two) times daily. 09/24/20  Yes Eileen Stanford, PA-C  apixaban (ELIQUIS) 5 MG TABS tablet Take 1 tablet (5 mg total) by mouth 2 (two) times daily. 09/24/20 09/19/21 Yes Eileen Stanford, PA-C  Blood Glucose Monitoring Suppl (FIFTY50 GLUCOSE METER 2.0) w/Device KIT See admin instructions. 05/16/19  Yes [provider]  Cholecalciferol 25 MCG (1000 UT) tablet Take 1,000 Units by mouth at bedtime.    Yes [provider]  diltiazem (CARDIZEM CD) 240 MG 24 hr capsule Take 240 mg by mouth daily. 09/14/20  Yes [provider]  gabapentin (NEURONTIN) 300 MG capsule Take 600 mg by mouth 2 (two) times daily.  08/12/20  Yes [provider]  HYDROcodone-acetaminophen (NORCO) 10-325 MG tablet Take 1 tablet by mouth every 6 (six) hours as needed for moderate pain.  08/04/20  Yes [provider]  latanoprost (XALATAN) 0.005 % ophthalmic solution Place 1 drop into both eyes at bedtime.  07/27/20  Yes [provider]  metFORMIN (GLUCOPHAGE) 1000 MG tablet Take 1,000 mg by mouth 2 (two) times daily. 07/10/20  Yes [provider]  metoprolol tartrate (LOPRESSOR) 25 MG tablet Take 1 tablet (25 mg total) by mouth 2 (two) times daily. 08/23/20  Yes Charlynne Cousins, MD  Multiple Vitamin (MULTIVITAMIN WITH MINERALS) TABS tablet Take 1 tablet by mouth at bedtime.    Yes [provider]  oxyCODONE ER (XTAMPZA ER) 9 MG C12A Take 9 mg by mouth every 12 (twelve) hours.   Yes [provider]  pantoprazole (PROTONIX) 40 MG tablet Take 40 mg by mouth 2 (two) times daily. 05/26/20  Yes [provider]  [place] into the lungs at bedtime. CPAP   Yes [provider]  rosuvastatin (CRESTOR) 20 MG tablet Take 20 mg by mouth daily.  05/25/20  Yes [provider]    Scheduled Meds: . amiodarone  400 mg Oral BID  . diltiazem  240 mg Oral Daily  . gabapentin  600 mg Oral BID  . latanoprost  1 drop Both Eyes QHS  . metFORMIN  1,000 mg Oral BID  . metoprolol tartrate  25 mg Oral BID  . pantoprazole  40 mg Oral BID  . rosuvastatin  20 mg Oral Daily   Infusions: . lactated ringers 100 mL/hr at 09/30/20 0905   PRN Meds: acetaminophen **OR**  acetaminophen, albuterol   Allergies as of 09/29/2020 - Review Complete 09/29/2020  Allergen Reaction Noted  . Cocoa Hives 10/28/2014  . Sulfamethoxazole-trimethoprim Anaphylaxis 07/22/2014  . Chocolate Hives 06/08/2014  . Citrus Hives 08/16/2020  . Fish allergy Hives and Swelling 06/08/2014  . Shellfish allergy Hives and Swelling 08/16/2020  . Sulfa antibiotics Hives and Swelling 06/08/2014  . Tetracycline Swelling 01/11/2016    History reviewed. No pertinent family history.  Social History   Socioeconomic History  . Marital status: Single    Spouse name: Not on file  . Number of children: Not on file  . Years of education: Not on file  . Highest education level: Not on file  Occupational History  . Not on file  Tobacco Use  . Smoking status: Former Research scientist (life sciences)  . Smokeless tobacco: Never Used  . Tobacco comment: per patient quit over 30 years ago  Vaping Use  . Vaping Use: Never used  Substance and Sexual Activity  . Alcohol use: No  . Drug use: Not Currently  . Sexual activity: Not on file  Other Topics Concern  . Not on file  Social History Narrative  . Not on file   Social Determinants of Health   Financial Resource Strain: Not on file  Food Insecurity: Not on file  Transportation Needs: Not on file  Physical Activity: Not on file  Stress: Not on file  Social  Connections: Not on file  Intimate Partner Violence: Not on file    REVIEW OF SYSTEMS: Constitutional: No weakness, no fatigue. ENT:  No nose bleeds Pulm: No shortness of breath, no cough CV:  No palpitations, no LE edema.  Patient is not aware that his heart rate is accelerated.  No chest pressure or pain GU:  No hematuria, no frequency GI: See HPI Heme: No unusual or excessive bleeding or bruising. Transfusions: Today's PRBCs are his first ever. Neuro:  No headaches, no peripheral tingling or numbness Derm:  No itching, no rash or sores.  Endocrine:  No sweats or chills.  No polyuria or dysuria Immunization: Has been immunized for COVID-19. Travel:  None beyond local counties in last few months.    PHYSICAL EXAM: Vital signs in last 24 hours: Vitals:   09/30/20 0640 09/30/20 0900  BP: (!) 182/98   Pulse: (!) 116   Resp: 19   Temp: 98.6 F (37 C) 98.8 F (37.1 C)  SpO2: 95%    Wt Readings from Last 3 Encounters:  09/24/20 (!) 142.9 kg  09/16/20 (!) 143.4 kg  09/02/20 (!) 139.9 kg    General: Obese, pleasant, comfortable.  Does not look acutely ill. Head: No facial asymmetry or swelling.  No signs of head trauma. Eyes: No scleral icterus.  No conjunctival pallor.  EOMI Ears: Not hard of hearing Nose: No congestion or discharge Mouth: Good dentition.  Tongue midline.  Mucosa pink, moist, clear. Neck: No JVD, no thyromegaly or masses. Lungs: Clear bilaterally without labored breathing.  No cough Heart: RRR.  No MRG.  S1, S2 present Abdomen: Obese, soft.  Umbilical hernia is not tender.  No abdominal tenderness.  Active bowel sounds.  No HSM, masses, bruits appreciated..   Rectal: DRE not repeated this was performed by the PA in the emergency room and revealed brown stool, no masses.  Tested in the lab FOBT positive but no visible blood. Musc/Skeltl: No joint redness, swelling or gross deformity. Extremities: No CCE. Neurologic: Alert.  Oriented x3.  Moves all 4 limbs  without tremor, strength  not tested. Skin: No rash, no sores.  Significant pitting of the skin on his head/face. Tattoos: None observed Nodes: No cervical adenopathy Psych: Cooperative, calm, pleasant.  Fluid speech.  Intake/Output from previous day: No intake/output data recorded. Intake/Output this shift: Total I/O In: 315 [Blood:315] Out: -   LAB RESULTS: Recent Labs    09/29/20 2002  WBC 8.1  HGB 7.4*  HCT 26.2*  PLT 293   BMET Lab Results  Component Value Date   NA 136 09/29/2020   NA 140 09/24/2020   NA 136 09/16/2020   K 4.3 09/29/2020   K 4.6 09/24/2020   K 5.0 09/16/2020   CL 102 09/29/2020   CL 103 09/24/2020   CL 98 09/16/2020   CO2 25 09/29/2020   CO2 23 09/24/2020   CO2 29 09/16/2020   GLUCOSE 148 (H) 09/29/2020   GLUCOSE 101 (H) 09/24/2020   GLUCOSE 125 (H) 09/16/2020   BUN 21 09/29/2020   BUN 21 09/24/2020   BUN 32 (H) 09/16/2020   CREATININE 1.57 (H) 09/29/2020   CREATININE 1.42 (H) 09/24/2020   CREATININE 1.45 (H) 09/16/2020   CALCIUM 9.2 09/29/2020   CALCIUM 8.8 09/24/2020   CALCIUM 9.8 09/16/2020   LFT No results for input(s): PROT, ALBUMIN, AST, ALT, ALKPHOS, BILITOT, BILIDIR, IBILI in the last 72 hours. PT/INR Lab Results  Component Value Date   INR 0.9 08/27/2020   Hepatitis Panel No results for input(s): HEPBSAG, HCVAB, HEPAIGM, HEPBIGM in the last 72 hours. C-Diff No components found for: CDIFF Lipase  No results found for: LIPASE  Drugs of Abuse  No results found for: LABOPIA, COCAINSCRNUR, LABBENZ, AMPHETMU, THCU, LABBARB   RADIOLOGY STUDIES: No results found.   IMPRESSION:   *    FOBT positive. History colon polyps on previous screening colonoscopies.  Polyps removed at 12/2015 colonoscopy but unable to access pathology report.  His GI doctor is Dr. Alphonzo Dublin in [facility]. History gastritis, duodenitis and small hiatal hernia on remote EGD.  Compliant with twice daily full dose Protonix. Previous capsule  endoscopy 2019.    *   Acute on chronic anemia.  Getting 1 PRBC. Anemia is not symptomatic.  *     Sinus tachycardia.  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  Switch from DAPT to Eliquis about a week ago due to new finding of atrial flutter. Last dose eliquis was 12/14.      PLAN:     *     Perform EGD tomorrow. Defer colonoscopy back to Dr. Mitchell Heir in [facility]. CBC in AM.   No need for more than Protonix 40 po bid. Heart healthy diet today.     Azucena Freed  09/30/2020, 9:51 AM Phone [phone]   Attending physician's note   I have taken a history, examined the patient and reviewed the chart. I agree with the Advanced Practitioner's note, impression and recommendations.  64 year old male with multiple comorbidities, morbid obesity, OSA, stage III CKD, pulmonary hypertension, severe aortic stenosis s/p TAVR, a flutter on Eliquis  Admitted with worsening of chronic iron deficiency anemia  Has had work-up as outpatient for chronic iron deficiency at [facility], unable to retrieve all the reports in Care Everywhere  Hemoglobin 7.4 from baseline 10. Fecal Hemoccult positive. No overt bleeding  We will plan to proceed with EGD to exclude any high risk lesions. If negative okay to discharge home tomorrow and follow-up with PMD and outpatient GI with routine monitoring of CBC  and iron panel every 4 weeks with IV iron therapy as needed  Continue Protonix p.o. twice daily N.p.o. after midnight  The risks and benefits as well as alternatives of endoscopic procedure(s) have been discussed and reviewed. All questions answered. The patient agrees to proceed.   The patient was provided an opportunity to ask questions and all were answered. The patient agreed with the plan and demonstrated an understanding of the instructions.  Damaris Hippo , MD [phone]

## 2020-09-30 NOTE — Anesthesia Preprocedure Evaluation (Addendum)
Anesthesia Evaluation  Patient identified by MRN, date of birth, ID band Patient awake    Reviewed: Allergy & Precautions, NPO status , Patient's Chart, lab work & pertinent test results  History of Anesthesia Complications Negative for: history of anesthetic complications  Airway Mallampati: II  TM Distance: >3 FB Neck ROM: Full    Dental   Pulmonary asthma , sleep apnea and Continuous Positive Airway Pressure Ventilation , former smoker,    Pulmonary exam normal        Cardiovascular hypertension, Pt. on home beta blockers and Pt. on medications + dysrhythmias Atrial Fibrillation + Valvular Problems/Murmurs (s/p TAVR 09/01/20) AS  Rhythm:Regular Rate:Tachycardia     Neuro/Psych negative neurological ROS  negative psych ROS   GI/Hepatic Neg liver ROS, GERD  ,+FOBT   Endo/Other  diabetes, Type 2, Oral Hypoglycemic AgentsMorbid obesity  Renal/GU Renal InsufficiencyRenal disease (CKD3, Cr 1.57)  negative genitourinary   Musculoskeletal negative musculoskeletal ROS (+)   Abdominal   Peds  Hematology  (+) anemia , Hgb 7.9 Eliquis   Anesthesia Other Findings  Echo 09/30/20: EF 55-60%, normal RV function, normal PASP, the aortic valve has been replaced with a TAVR valve, no stenosis or perivalvular regurg, normal structure and function of the prosthesis  Reproductive/Obstetrics                            Anesthesia Physical Anesthesia Plan  ASA: IV  Anesthesia Plan: MAC   Post-op Pain Management:    Induction: Intravenous  PONV Risk Score and Plan: 1 and Propofol infusion, TIVA and Treatment may vary due to age or medical condition  Airway Management Planned: Natural Airway, Nasal Cannula and Simple Face Mask  Additional Equipment: None  Intra-op Plan:   Post-operative Plan:   Informed Consent: I have reviewed the patients History and Physical, chart, labs and discussed the  procedure including the risks, benefits and alternatives for the proposed anesthesia with the patient or authorized representative who has indicated his/her understanding and acceptance.       Plan Discussed with:   Anesthesia Plan Comments:        Anesthesia Quick Evaluation

## 2020-09-30 NOTE — ED Notes (Signed)
Lunch Tray Ordered @ 1031. 

## 2020-09-30 NOTE — ED Notes (Signed)
Patient denies pain and is resting comfortably.  

## 2020-09-30 NOTE — Progress Notes (Addendum)
Seen and examined at the bedside.  Her nurse, Emilee,  was at the bedside as well.  Vital signs significant for elevated blood pressure and elevated heart rate (from atrial fibrillation).  He completed transfusion with 1 unit of packed red blood cells this morning.  Hemoglobin went from 7.4-7.9.  Transfuse another unit of packed red blood cells today.  He has been evaluated by gastroenterologist with plan for EGD tomorrow.  Continue current management.

## 2020-09-30 NOTE — ED Provider Notes (Addendum)
[facility] EMERGENCY DEPARTMENT Provider Note   CSN: [id number] Arrival date & time: 09/29/20  1920     History Chief Complaint  Patient presents with  . Anemia  . Abnormal Lab    Lawrence Rose is a 64 y.o. male.  The history is provided by the patient and medical records.  Anemia  Abnormal Lab   64 year old male with history of acid reflux, asthma, chronic back and knee pain, diabetes, hypertension, history of TAVR on eliquis, presenting to the ED with low hemoglobin.  States he had some recent blood work done with his primary care office and received a call that his numbers were dropping.  States overall he has felt pretty good, no profound weakness or syncopal episodes.  Denies any chest pain or shortness of breath.  States he had similar problem like this in the past and received an iron infusion.  Denies history of GI bleeding.  No dark/tarry stools or frank blood in stools recently.  Prior colonoscopy x3, last done within the past 5 years in [place].  Past Medical History:  Diagnosis Date  . Acid reflux   . Allergic arthritis, hand    per patient, both hands  . Apnea   . Asthma   . Carpal tunnel syndrome    per patient, both hands  . Chronic back pain   . Chronic knee pain   . Diabetes mellitus without complication ([place])   . Hypertension   . 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 Angelena Form and Dr. Cyndia Bent.   . Severe aortic stenosis   . Sleep apnea    wears CPAP every night    Patient Active Problem List   Diagnosis Date Noted  . Anemia 09/01/2020  . Pulmonary hypertension, unspecified ([place]) 09/01/2020  . 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 Severe aortic stenosis   . Nonrheumatic mitral valve stenosis   . CKD (chronic kidney disease) stage 3, GFR 30-59 ml/min (HCC) 08/17/2020  . OSA on CPAP 08/17/2020  . Obesity, Class  III, BMI 40-49.9 (morbid obesity) ([place]) 08/17/2020    Past Surgical History:  Procedure Laterality Date  . BACK SURGERY    . RIGHT/LEFT HEART CATH AND CORONARY ANGIOGRAPHY N/A 08/18/2020   Procedure: RIGHT/LEFT HEART CATH AND CORONARY ANGIOGRAPHY;  Surgeon: [name], MD;  Location: [facility];  Service: Cardiovascular;  Laterality: N/A;  . TEE WITHOUT CARDIOVERSION N/A 08/21/2020   Procedure: TRANSESOPHAGEAL ECHOCARDIOGRAM (TEE);  Surgeon: [name], MD;  Location: [facility] END[facility];  Service: Cardiovascular;  Laterality: N/A;  . TEE WITHOUT CARDIOVERSION N/A 09/01/2020   Procedure: TRANSESOPHAGEAL ECHOCARDIOGRAM (TEE);  Surgeon: [name], MD;  Location: [facility];  Service: Open Heart Surgery;  Laterality: N/A;  . TRANSCATHETER AORTIC VALVE REPLACEMENT, TRANSFEMORAL N/A 09/01/2020   Procedure: TRANSCATHETER AORTIC VALVE REPLACEMENT, TRANSFEMORAL;  Surgeon: [name], MD;  Location: [facility];  Service: Open Heart Surgery;  Laterality: N/A;       History reviewed. No pertinent family history.  Social History   Tobacco Use  . Smoking status: Former Research scientist (life sciences)  . Smokeless tobacco: Never Used  . Tobacco comment: per patient quit over 30 years ago  Vaping Use  . Vaping Use: Never used  Substance Use Topics  . Alcohol use: No  . Drug use: Not Currently    Home Medications Prior to  Admission medications   Medication Sig Start Date End Date Taking? Authorizing Provider  albuterol (VENTOLIN HFA) 108 (90 Base) MCG/ACT inhaler Inhale 2 puffs into the lungs every 4 (four) hours as needed for wheezing or shortness of breath.  08/22/16   [provider]  amiodarone (PACERONE) 200 MG tablet Take 2 tablets (400 mg total) by mouth 2 (two) times daily. 09/24/20   Eileen Stanford, PA-C  apixaban (ELIQUIS) 5 MG TABS tablet Take 1 tablet (5 mg total) by mouth 2 (two) times daily. 09/24/20 09/19/21  Eileen Stanford, PA-C  Blood  Glucose Monitoring Suppl (FIFTY50 GLUCOSE METER 2.0) w/Device KIT See admin instructions. 05/16/19   [provider]  Cholecalciferol 25 MCG (1000 UT) tablet Take 1,000 Units by mouth at bedtime.     [provider]  diltiazem (CARDIZEM CD) 240 MG 24 hr capsule Take 1 capsule by mouth. 09/14/20   [provider]  gabapentin (NEURONTIN) 300 MG capsule Take 600 mg by mouth 2 (two) times daily.  08/12/20   [provider]  HYDROcodone-acetaminophen (NORCO) 10-325 MG tablet Take 1 tablet by mouth every 6 (six) hours as needed for moderate pain.  08/04/20   [provider]  latanoprost (XALATAN) 0.005 % ophthalmic solution Place 1 drop into both eyes at bedtime.  07/27/20   [provider]  metFORMIN (GLUCOPHAGE) 1000 MG tablet Take 1,000 mg by mouth 2 (two) times daily. 07/10/20   [provider]  metoprolol tartrate (LOPRESSOR) 25 MG tablet Take 1 tablet (25 mg total) by mouth 2 (two) times daily. 08/23/20   Charlynne Cousins, MD  Multiple Vitamin (MULTIVITAMIN WITH MINERALS) TABS tablet Take 1 tablet by mouth at bedtime.     [provider]  oxyCODONE ER (XTAMPZA ER) 9 MG C12A Take 9 mg by mouth every 12 (twelve) hours.    [provider]  pantoprazole (PROTONIX) 40 MG tablet Take 40 mg by mouth 2 (two) times daily. 05/26/20   [provider]  PRESCRIPTION MEDICATION Inhale into the lungs at bedtime. CPAP    [provider]  rosuvastatin (CRESTOR) 20 MG tablet Take 20 mg by mouth daily.  05/25/20   [provider]    Allergies    Cocoa, Sulfamethoxazole-trimethoprim, Chocolate, Citrus, Fish allergy, Shellfish allergy, Sulfa antibiotics, and Tetracycline  Review of Systems   Review of Systems  Constitutional:       Abnormal labs  All other systems reviewed and are negative.   Physical Exam Updated Vital Signs BP (!) 156/103   Pulse (!) 106   Temp 98.2 F (36.8 C)   Resp 18   SpO2 100%    Physical Exam Vitals and nursing note reviewed.  Constitutional:      Appearance: He is well-developed and well-nourished.  HENT:     Head: Normocephalic and atraumatic.     Mouth/Throat:     Mouth: Oropharynx is clear and moist.  Eyes:     Extraocular Movements: EOM normal.     Conjunctiva/sclera: Conjunctivae normal.     Pupils: Pupils are equal, round, and reactive to light.  Cardiovascular:     Rate and Rhythm: Normal rate and regular rhythm.     Heart sounds: Normal heart sounds.  Pulmonary:     Effort: Pulmonary effort is normal. No respiratory distress.     Breath sounds: Normal breath sounds. No rhonchi.  Abdominal:     General: Bowel sounds are normal.     Palpations: Abdomen is soft.  Comments: Obese abdomen, soft, non-tender  Genitourinary:    Comments: Exam chaperoned by RN Normal rectum, brown stool noted on glove, no frank blood Musculoskeletal:        General: Normal range of motion.     Cervical back: Normal range of motion.  Skin:    General: Skin is warm and dry.  Neurological:     Mental Status: He is alert and oriented to person, place, and time.  Psychiatric:        Mood and Affect: Mood and affect normal.     ED Results / Procedures / Treatments   Labs (all labs ordered are listed, but only abnormal results are displayed) Labs Reviewed  BASIC METABOLIC PANEL - Abnormal; Notable for the following components:      Result Value   Glucose, Bld 148 (*)    Creatinine, Ser 1.57 (*)    GFR, Estimated 48 (*)    All other components within normal limits  CBC - Abnormal; Notable for the following components:   RBC 2.99 (*)    Hemoglobin 7.4 (*)    HCT 26.2 (*)    MCH 24.7 (*)    MCHC 28.2 (*)    RDW 16.1 (*)    All other components within normal limits  POC OCCULT BLOOD, ED - Abnormal; Notable for the following components:   Fecal Occult Bld POSITIVE (*)    All other components within normal limits  RESP PANEL BY RT-PCR (FLU A&B, COVID)  ARPGX2  TYPE AND SCREEN  PREPARE RBC (CROSSMATCH)    EKG None  Radiology No results found.  Procedures Procedures (including critical care time)  CRITICAL CARE Performed by: Larene Pickett   Total critical care time: 45 minutes  Critical care time was exclusive of separately billable procedures and treating other patients.  Critical care was necessary to treat or prevent imminent or life-threatening deterioration.  Critical care was time spent personally by me on the following activities: development of treatment plan with patient and/or surrogate as well as nursing, discussions with consultants, evaluation of patient's response to treatment, examination of patient, obtaining history from patient or surrogate, ordering and performing treatments and interventions, ordering and review of laboratory studies, ordering and review of radiographic studies, pulse oximetry and re-evaluation of patient's condition.   Medications Ordered in ED Medications - No data to display  ED Course  I have reviewed the triage vital signs and the nursing notes.  Pertinent labs & imaging results that were available during my care of the patient were reviewed by me and considered in my medical decision making (see chart for details).    MDM Rules/Calculators/A&P  64 year old male presenting to the ED with abnormal labs from PCP office.  States he was called and told his blood counts were low.  States history of the same in the past requiring iron transfusion.  He is afebrile and nontoxic.  He is somewhat tachycardic but blood pressure is stable.  He denies feeling dizzy or weak.  Abdomen is soft and nontender.  Rectal exam was performed without any gross abnormalities.  No flank blood on rectal exam but hemoccult + stool.  Hemoglobin today is 7.4, appears to have trended down over the past month.  He is currently on Eliquis status post TAVR.  Denies history of GI bleeding and has had prior colonoscopies  in [place].  Will go ahead and tranfuse 1 unit given his tachycardia and worsening hemoglobin.  Will need GI consult in the AM.  Discussed with hospitalist, Dr. Tonie Griffith-- will admit for ongoing care.  Message has been sent to on call GI physician to facilitate AM evaluation.  Final Clinical Impression(s) / ED Diagnoses Final diagnoses:  Anemia, unspecified type  Occult blood positive stool    Rx / DC Orders ED Discharge Orders    None       Larene Pickett, PA-C 09/30/20 0453    Larene Pickett, PA-C 09/30/20 2010    [name], MD 10/04/20 [phone]

## 2020-09-30 NOTE — H&P (Signed)
History and Physical    Lawrence Rose IRJ:[id number] DOB: 03-22-1956 DOA: 09/29/2020  PCP: Doreatha Lew, MD   Patient coming from:  Home  Chief Complaint: abnormal labs  HPI: Lawrence Rose is a 64 y.o. male with medical history significant for  HTN, HLD, anemia,morbid obesity,chronic back and knee pain with limited mobility,OSAon CPAP, CKD stage III, pulmonary HTN, sick euthyroid syndrome, mild MS and severe aortic stenosiss/p TAVR (09/01/20). Last week he was found to be in atrial flutter at follow up cardiology appointment and was taken off DAPT and placed on eliquis at that time.  Yesterday afternoon he was seen at cardiology for follow up.  He reports he was still feeling well and had no complaints. He denied having worsening dyspnea, swelling or fatigue. No palpitations or chest pain. He reports he has chronic constipation and states stools may be slightly darker than normal but he has not had any blood in stool. He denies any abdominal pain, nausea or vomiting. He was called and told that his labs showed decreased hemoglobin level and he needed to come to hospital for evaluation.   ED Course: In the emergency room he was found to have a hemoglobin of 7.4.  Has been dropping over the last month from a baseline of 10.9.  Is now to be Hemoccult positive on rectal exam in the emergency room.  Hospital service was asked to admit for further work-up.  Review of Systems:  General: Denies weakness, fever, chills, weight loss, night sweats.  Denies dizziness.  Denies change in appetite HENT: Denies head trauma, headache, denies change in hearing, tinnitus.  Denies nasal congestion or bleeding.  Denies sore throat, sores in mouth.  Denies difficulty swallowing Eyes: Denies blurry vision, pain in eye, drainage.  Denies discoloration of eyes. Neck: Denies pain.  Denies swelling.  Denies pain with movement. Cardiovascular: Denies chest pain, palpitations.  Denies edema.  Denies  orthopnea Respiratory: Denies shortness of breath, cough.  Denies wheezing.  Denies sputum production Gastrointestinal: Denies abdominal pain, swelling.  Denies nausea, vomiting, diarrhea. Denies hematemesis. Musculoskeletal: Denies limitation of movement.  Denies deformity or swelling.  Denies pain.  Denies arthralgias or myalgias. Genitourinary: Denies pelvic pain.  Denies urinary frequency or hesitancy.  Denies dysuria.  Skin: Denies rash.  Denies petechiae, purpura, ecchymosis. Neurological: Denies headache.  Denies syncope.  Denies seizure activity.  Denies weakness or paresthesia.  Denies slurred speech, drooping face.  Denies visual change. Psychiatric: Denies depression, anxiety.  Denies suicidal thoughts or ideation.  Denies hallucinations.  Past Medical History:  Diagnosis Date  . Acid reflux   . Allergic arthritis, hand    per patient, both hands  . Anemia   . Apnea   . Asthma   . Carpal tunnel syndrome    per patient, both hands  . Chronic back pain   . Chronic kidney disease   . Chronic knee pain   . Diabetes mellitus without complication ([place])   . Dysrhythmia   . Hypertension   . 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 Angelena Form and Dr. Cyndia Bent.   . Severe aortic stenosis   . Sleep apnea    wears CPAP every night    Past Surgical History:  Procedure Laterality Date  . BACK SURGERY    . RIGHT/LEFT HEART CATH AND CORONARY ANGIOGRAPHY N/A 08/18/2020   Procedure: RIGHT/LEFT HEART CATH AND CORONARY ANGIOGRAPHY;  Surgeon: Tamala Julian,  Lynnell Dike, MD;  Location: [facility];  Service: Cardiovascular;  Laterality: N/A;  . TEE WITHOUT CARDIOVERSION N/A 08/21/2020   Procedure: TRANSESOPHAGEAL ECHOCARDIOGRAM (TEE);  Surgeon: [name], MD;  Location: [facility];  Service: Cardiovascular;  Laterality: N/A;  . TEE WITHOUT CARDIOVERSION N/A 09/01/2020   Procedure: TRANSESOPHAGEAL  ECHOCARDIOGRAM (TEE);  Surgeon: [name], MD;  Location: [facility];  Service: Open Heart Surgery;  Laterality: N/A;  . TRANSCATHETER AORTIC VALVE REPLACEMENT, TRANSFEMORAL N/A 09/01/2020   Procedure: TRANSCATHETER AORTIC VALVE REPLACEMENT, TRANSFEMORAL;  Surgeon: [name], MD;  Location: [facility];  Service: Open Heart Surgery;  Laterality: N/A;    Social History  reports that he has quit smoking. He has never used smokeless tobacco. He reports previous drug use. He reports that he does not drink alcohol.  Allergies  Allergen Reactions  . Cocoa Hives  . Sulfamethoxazole-Trimethoprim Anaphylaxis  . Chocolate Hives  . Citrus Hives    Reaction to oranges and orange juice  . Fish Allergy Hives and Swelling    Throat swelling  . Shellfish Allergy Hives and Swelling    Throat swelling  . Sulfa Antibiotics Hives and Swelling    Throat swelling  . Tetracycline Swelling    History reviewed. No pertinent family history.   Prior to Admission medications   Medication Sig Start Date End Date Taking? Authorizing Provider  albuterol (VENTOLIN HFA) 108 (90 Base) MCG/ACT inhaler Inhale 2 puffs into the lungs every 4 (four) hours as needed for wheezing or shortness of breath.  08/22/16  Yes [provider]  amiodarone (PACERONE) 200 MG tablet Take 2 tablets (400 mg total) by mouth 2 (two) times daily. 09/24/20  Yes Eileen Stanford, PA-C  apixaban (ELIQUIS) 5 MG TABS tablet Take 1 tablet (5 mg total) by mouth 2 (two) times daily. 09/24/20 09/19/21 Yes Eileen Stanford, PA-C  Blood Glucose Monitoring Suppl (FIFTY50 GLUCOSE METER 2.0) w/Device KIT See admin instructions. 05/16/19  Yes [provider]  Cholecalciferol 25 MCG (1000 UT) tablet Take 1,000 Units by mouth at bedtime.    Yes [provider]  diltiazem (CARDIZEM CD) 240 MG 24 hr capsule Take 240 mg by mouth daily. 09/14/20  Yes [provider]  gabapentin  (NEURONTIN) 300 MG capsule Take 600 mg by mouth 2 (two) times daily.  08/12/20  Yes [provider]  HYDROcodone-acetaminophen (NORCO) 10-325 MG tablet Take 1 tablet by mouth every 6 (six) hours as needed for moderate pain.  08/04/20  Yes [provider]  latanoprost (XALATAN) 0.005 % ophthalmic solution Place 1 drop into both eyes at bedtime.  07/27/20  Yes [provider]  metFORMIN (GLUCOPHAGE) 1000 MG tablet Take 1,000 mg by mouth 2 (two) times daily. 07/10/20  Yes [provider]  metoprolol tartrate (LOPRESSOR) 25 MG tablet Take 1 tablet (25 mg total) by mouth 2 (two) times daily. 08/23/20  Yes Charlynne Cousins, MD  Multiple Vitamin (MULTIVITAMIN WITH MINERALS) TABS tablet Take 1 tablet by mouth at bedtime.    Yes [provider]  oxyCODONE ER (XTAMPZA ER) 9 MG C12A Take 9 mg by mouth every 12 (twelve) hours.   Yes [provider]  pantoprazole (PROTONIX) 40 MG tablet Take 40 mg by mouth 2 (two) times daily. 05/26/20  Yes [provider]  [place] into the lungs at bedtime. CPAP   Yes [provider]  rosuvastatin (CRESTOR) 20 MG tablet Take 20 mg by  mouth daily.  05/25/20  Yes [provider]    Physical Exam: Vitals:   09/29/20 2247 09/30/20 0132 09/30/20 0308 09/30/20 0419  BP: 128/78 134/80 (!) 156/103 (!) 184/104  Pulse: (!) 109 (!) 108 (!) 106 (!) 113  Resp: '18 18 18 15  ' Temp: 98.5 F (36.9 C) 98.2 F (36.8 C)    TempSrc:      SpO2: 100% 100% 100% 96%    Constitutional: NAD, calm, comfortable Vitals:   09/29/20 2247 09/30/20 0132 09/30/20 0308 09/30/20 0419  BP: 128/78 134/80 (!) 156/103 (!) 184/104  Pulse: (!) 109 (!) 108 (!) 106 (!) 113  Resp: '18 18 18 15  ' Temp: 98.5 F (36.9 C) 98.2 F (36.8 C)    TempSrc:      SpO2: 100% 100% 100% 96%   General: WDWN, Alert and oriented x3.  Eyes: EOMI, PERRL, conjunctivae normal.  Sclera nonicteric HENT:  [place]/AT, external ears  normal.  Nares patent without epistasis.  Mucous membranes are moist. Posterior pharynx clear of any exudate or lesions Neck: Soft, normal range of motion, supple, no masses, no thyromegaly. Trachea midline Respiratory: clear to auscultation bilaterally, no wheezing, no crackles. Normal respiratory effort. No accessory muscle use.  Cardiovascular:  Irregular rhythm, tachycardia, no murmurs / rubs / gallops.  No edema. Abdomen: Soft, no tenderness, nondistended, no rebound or guarding. Morbidly obese. No masses palpated. Bowel sounds normoactive Musculoskeletal: FROM. no clubbing / cyanosis. No joint deformity upper and lower extremities. Normal muscle tone.  Skin: Warm, dry, intact no rashes, lesions, ulcers. No induration Neurologic: CN 2-12 grossly intact.  Normal speech.  Sensation intact. Strength 5/5 in all extremities.   Psychiatric: Normal judgment and insight.  Normal mood.  Stool Hemoccult positive in ER    Labs on Admission: I have personally reviewed following labs and imaging studies  CBC: Recent Labs  Lab 09/24/20 1420 09/29/20 2002  WBC 11.0* 8.1  NEUTROABS 8.6*  --   HGB 7.4* 7.4*  HCT 23.8* 26.2*  MCV 82 87.6  PLT 232 850    Basic Metabolic Panel: Recent Labs  Lab 09/24/20 1420 09/29/20 2002  NA 140 136  K 4.6 4.3  CL 103 102  CO2 23 25  GLUCOSE 101* 148*  BUN 21 21  CREATININE 1.42* 1.57*  CALCIUM 8.8 9.2    GFR: Estimated Creatinine Clearance: 67 mL/min (A) (by C-G formula based on SCr of 1.57 mg/dL (H)).  Liver Function Tests: No results for input(s): AST, ALT, ALKPHOS, BILITOT, PROT, ALBUMIN in the last 168 hours.  Urine analysis:    Component Value Date/Time   COLORURINE YELLOW 08/27/2020 [place] 08/27/2020 1525   LABSPEC 1.009 08/27/2020 1525   PHURINE 6.0 08/27/2020 1525   GLUCOSEU NEGATIVE 08/27/2020 1525   HGBUR NEGATIVE 08/27/2020 1525   BILIRUBINUR NEGATIVE 08/27/2020 [place] 08/27/2020 [place] 08/27/2020 1525   NITRITE NEGATIVE 08/27/2020 [place] 08/27/2020 1525    Radiological Exams on Admission: No results found.  EKG: Independently reviewed. Atrial flutter with no acute ST elevation or depression. QTc is 465  Assessment/Plan Principal Problem:   GI bleed Mr. Kurtzman is admitted to med telemetry floor. One Unit of PRBC ordered for transfusion in Er. Recheck Hgb/Hct to monitor response.  GI consulted for evaluation of GI bleed with hemoccult positive stool. Pt has been on Eliquis with a-flutter and recent TAVR. Eliquis held now.   Active Problems:  Anemia Transfuse PRBC. Monitor Hgb/Hct    CKD (chronic kidney disease) stage 3, GFR 30-59 ml/min (HCC) Stable. IVF hydration with LR at 100 ml/hr.  Recheck electrolytes and renal function in am    OSA on CPAP Use CPAP at night.     Chronic anticoagulation Held secondary to GI bleeding.    Atrial flutter  Followed by Cardiology. Continue cardizem, metoprolol and amiodarone    Diabetes mellitus type 2 Patient hemoglobin A1c last month that was 6.1.  Continue Metformin.    Obesity, Class III, BMI 40-49.9 (morbid obesity)  Follow up with PCP for dietary, lifestyle and possible pharmacotherapy interventions for weight loss    DVT prophylaxis: Patient is on Eliquis which will be held.  Patient given SCDs for DVT prophylaxis in light of GI bleed. Code Status:   Full code Family Communication:  Diagnosis and plan discussed with patient.  He verbalized understanding agrees with plan.  Further recommendations to follow as clinically indicated Disposition Plan:   Patient is from:  Home  Anticipated DC to:  Home  Anticipated DC date:  Dissipate greater than 2 midnights in the hospital to treat acute medical      condition  Anticipated DC barriers: No barriers to discharge identified at this time  Consults called:  Gastroenterology consulted by ER physician to see pt this  am Admission status:  Inpatient.   Yevonne Aline Diyari Cherne MD [facility]  How to contact the [facility] Attending or Consulting provider [place] or covering provider during after hours [place], for this patient?   1. Check the care team in [facility] and look for a) attending/consulting TR[facility] provider listed and b) the [facility] team listed 2. Log into [email or web address] and use [place] universal password to access. If you do not have the password, please contact the hospital operator. 3. Locate the [facility] provider you are looking for under [facility] and page to a number that you can be directly reached. 4. If you still have difficulty reaching the provider, please page the [facility] (Director on Call) for the Hospitalists listed on amion for assistance.  09/30/2020, 5:13 AM

## 2020-09-30 NOTE — ED Notes (Signed)
Report called to 6N RN, patient transported to vascular, will go from there to assigned room.   All belongings transported with patient.   Denied any pain at time of departure  VSS

## 2020-09-30 NOTE — Consult Note (Addendum)
[facility] Consult: 9:51 AM 09/30/2020  LOS: 0 days    Referring Provider: Dr Mal Misty Primary Care Physician:  [name], [name], MD  [facility] teaching service.   Primary Gastroenterologist:  Dr. Mitchell Heir in [facility].    Reason for Consultation: Anemia.   HPI: Lawrence Rose is a 64 y.o. male.  PMH hypertension.  Hyperlipidemia.  Morbid obesity.  Hyperlipidemia.  Chronic back/knee pain, limited mobility.  OSA on CPAP.  CKD stage 3.  Pulmonary hypertension.  Sick euthyroid syndrome.  Mild mitral valve and severe aortic valve stenosis.  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  Atrial flutter at cardiology appointment 1 week ago.  Cardiologist stopped DAPT and started Eliquis. A hematologist, Dr. Dustin Folks in [facility] follows him for IDA.  At office visit of 07/02/2020 patient was doing well and anemia labs obtained. Hgb 10.4, MCV 78 on 08/28/2020  Has had a total of three previous screening colonoscopy.  "Benign polyps" on his second colonoscopy around 2010. 2004 EGD: Erosive antral gastritis, erosive bulbar duodenitis.  Small, sliding hiatal hernia. 12/2015 colonoscopy. Dr Phineas Douglas in [facility].  Screening study.  Benign-appearing, 2 to 3 mm sized, polyps removed from the transverse colon.  Rare sigmoid diverticulosis.  Moderate internal hemorrhoids, not bleeding.  Unable to find pathology report in Care Everywhere. 10/2017 Capsule endoscopy.  Per Dr Mitchell Heir.  Unable to access report in Care Everywhere.    Routine outpatient labs revealed Hgb 7.4, down from 10.9.  FOBT positive.  Sent to the ED and now admitted for further work-up. Patient denies any symptoms related to the anemia such as new or progressive short of breath, increased fatigue.  No bloody or dark stools.  No nausea or vomiting, no heartburn.  No use of NSAIDs but does  take low-dose aspirin daily. Last dose of apixaban was 12/14.  Compliant with Protonix 40 bid.   He has tachycardia but has not noticed any fast heart rate or chest pressure/pain.  Usual or excessive bleeding or bruising.  Has been on a portion controlled diet and managed to lose 50 pounds in the last year or so.  Social history No alcohol.     Past Medical History:  Diagnosis Date  . Acid reflux   . Allergic arthritis, hand    per patient, both hands  . Anemia   . Apnea   . Asthma   . Carpal tunnel syndrome    per patient, both hands  . Chronic back pain   . Chronic kidney disease   . Chronic knee pain   . Diabetes mellitus without complication ([place])   . Dysrhythmia   . Hypertension   . 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 Angelena Form and Dr. Cyndia Bent.   . Severe aortic stenosis   . Sleep apnea    wears CPAP every night    Past Surgical History:  Procedure Laterality Date  . BACK SURGERY    . RIGHT/LEFT HEART CATH AND CORONARY ANGIOGRAPHY N/A  08/18/2020   Procedure: RIGHT/LEFT HEART CATH AND CORONARY ANGIOGRAPHY;  Surgeon: [name], MD;  Location: [place] [facility];  Service: Cardiovascular;  Laterality: N/A;  . TEE WITHOUT CARDIOVERSION N/A 08/21/2020   Procedure: TRANSESOPHAGEAL ECHOCARDIOGRAM (TEE);  Surgeon: [name], MD;  Location: [facility] ENDOSCOPY;  Service: Cardiovascular;  Laterality: N/A;  . TEE WITHOUT CARDIOVERSION N/A 09/01/2020   Procedure: TRANSESOPHAGEAL ECHOCARDIOGRAM (TEE);  Surgeon: [name], MD;  Location: [place] [facility];  Service: Open Heart Surgery;  Laterality: N/A;  . TRANSCATHETER AORTIC VALVE REPLACEMENT, TRANSFEMORAL N/A 09/01/2020   Procedure: TRANSCATHETER AORTIC VALVE REPLACEMENT, TRANSFEMORAL;  Surgeon: [name], MD;  Location: [place] [facility];  Service: Open Heart Surgery;  Laterality: N/A;    Prior to  Admission medications   Medication Sig Start Date End Date Taking? Authorizing Provider  albuterol (VENTOLIN HFA) 108 (90 Base) MCG/ACT inhaler Inhale 2 puffs into the lungs every 4 (four) hours as needed for wheezing or shortness of breath.  08/22/16  Yes [provider]  amiodarone (PACERONE) 200 MG tablet Take 2 tablets (400 mg total) by mouth 2 (two) times daily. 09/24/20  Yes Eileen Stanford, PA-C  apixaban (ELIQUIS) 5 MG TABS tablet Take 1 tablet (5 mg total) by mouth 2 (two) times daily. 09/24/20 09/19/21 Yes Eileen Stanford, PA-C  Blood Glucose Monitoring Suppl (FIFTY50 GLUCOSE METER 2.0) w/Device KIT See admin instructions. 05/16/19  Yes [provider]  Cholecalciferol 25 MCG (1000 UT) tablet Take 1,000 Units by mouth at bedtime.    Yes [provider]  diltiazem (CARDIZEM CD) 240 MG 24 hr capsule Take 240 mg by mouth daily. 09/14/20  Yes [provider]  gabapentin (NEURONTIN) 300 MG capsule Take 600 mg by mouth 2 (two) times daily.  08/12/20  Yes [provider]  HYDROcodone-acetaminophen (NORCO) 10-325 MG tablet Take 1 tablet by mouth every 6 (six) hours as needed for moderate pain.  08/04/20  Yes [provider]  latanoprost (XALATAN) 0.005 % ophthalmic solution Place 1 drop into both eyes at bedtime.  07/27/20  Yes [provider]  metFORMIN (GLUCOPHAGE) 1000 MG tablet Take 1,000 mg by mouth 2 (two) times daily. 07/10/20  Yes [provider]  metoprolol tartrate (LOPRESSOR) 25 MG tablet Take 1 tablet (25 mg total) by mouth 2 (two) times daily. 08/23/20  Yes Charlynne Cousins, MD  Multiple Vitamin (MULTIVITAMIN WITH MINERALS) TABS tablet Take 1 tablet by mouth at bedtime.    Yes [provider]  oxyCODONE ER (XTAMPZA ER) 9 MG C12A Take 9 mg by mouth every 12 (twelve) hours.   Yes [provider]  pantoprazole (PROTONIX) 40 MG tablet Take 40 mg by mouth 2 (two) times daily. 05/26/20  Yes [provider]  [place] into the lungs at bedtime. CPAP   Yes [provider]  rosuvastatin (CRESTOR) 20 MG tablet Take 20 mg by mouth daily.  05/25/20  Yes [provider]    Scheduled Meds: . amiodarone  400 mg Oral BID  . diltiazem  240 mg Oral Daily  . gabapentin  600 mg Oral BID  . latanoprost  1 drop Both Eyes QHS  . metFORMIN  1,000 mg Oral BID  . metoprolol tartrate  25 mg Oral BID  . pantoprazole  40 mg Oral BID  . rosuvastatin  20 mg Oral Daily   Infusions: . lactated ringers 100 mL/hr at 09/30/20 0905   PRN Meds: acetaminophen **OR**  acetaminophen, albuterol   Allergies as of 09/29/2020 - Review Complete 09/29/2020  Allergen Reaction Noted  . Cocoa Hives 10/28/2014  . Sulfamethoxazole-trimethoprim Anaphylaxis 07/22/2014  . Chocolate Hives 06/08/2014  . Citrus Hives 08/16/2020  . Fish allergy Hives and Swelling 06/08/2014  . Shellfish allergy Hives and Swelling 08/16/2020  . Sulfa antibiotics Hives and Swelling 06/08/2014  . Tetracycline Swelling 01/11/2016    History reviewed. No pertinent family history.  Social History   Socioeconomic History  . Marital status: Single    Spouse name: Not on file  . Number of children: Not on file  . Years of education: Not on file  . Highest education level: Not on file  Occupational History  . Not on file  Tobacco Use  . Smoking status: Former Research scientist (life sciences)  . Smokeless tobacco: Never Used  . Tobacco comment: per patient quit over 30 years ago  Vaping Use  . Vaping Use: Never used  Substance and Sexual Activity  . Alcohol use: No  . Drug use: Not Currently  . Sexual activity: Not on file  Other Topics Concern  . Not on file  Social History Narrative  . Not on file   Social Determinants of Health   Financial Resource Strain: Not on file  Food Insecurity: Not on file  Transportation Needs: Not on file  Physical Activity: Not on file  Stress: Not on file  Social  Connections: Not on file  Intimate Partner Violence: Not on file    REVIEW OF SYSTEMS: Constitutional: No weakness, no fatigue. ENT:  No nose bleeds Pulm: No shortness of breath, no cough CV:  No palpitations, no LE edema.  Patient is not aware that his heart rate is accelerated.  No chest pressure or pain GU:  No hematuria, no frequency GI: See HPI Heme: No unusual or excessive bleeding or bruising. Transfusions: Today's PRBCs are his first ever. Neuro:  No headaches, no peripheral tingling or numbness Derm:  No itching, no rash or sores.  Endocrine:  No sweats or chills.  No polyuria or dysuria Immunization: Has been immunized for COVID-19. Travel:  None beyond local counties in last few months.    PHYSICAL EXAM: Vital signs in last 24 hours: Vitals:   09/30/20 0640 09/30/20 0900  BP: (!) 182/98   Pulse: (!) 116   Resp: 19   Temp: 98.6 F (37 C) 98.8 F (37.1 C)  SpO2: 95%    Wt Readings from Last 3 Encounters:  09/24/20 (!) 142.9 kg  09/16/20 (!) 143.4 kg  09/02/20 (!) 139.9 kg    General: Obese, pleasant, comfortable.  Does not look acutely ill. Head: No facial asymmetry or swelling.  No signs of head trauma. Eyes: No scleral icterus.  No conjunctival pallor.  EOMI Ears: Not hard of hearing Nose: No congestion or discharge Mouth: Good dentition.  Tongue midline.  Mucosa pink, moist, clear. Neck: No JVD, no thyromegaly or masses. Lungs: Clear bilaterally without labored breathing.  No cough Heart: RRR.  No MRG.  S1, S2 present Abdomen: Obese, soft.  Umbilical hernia is not tender.  No abdominal tenderness.  Active bowel sounds.  No HSM, masses, bruits appreciated..   Rectal: DRE not repeated this was performed by the PA in the emergency room and revealed brown stool, no masses.  Tested in the lab FOBT positive but no visible blood. Musc/Skeltl: No joint redness, swelling or gross deformity. Extremities: No CCE. Neurologic: Alert.  Oriented x3.  Moves all 4 limbs  without tremor, strength  not tested. Skin: No rash, no sores.  Significant pitting of the skin on his head/face. Tattoos: None observed Nodes: No cervical adenopathy Psych: Cooperative, calm, pleasant.  Fluid speech.  Intake/Output from previous day: No intake/output data recorded. Intake/Output this shift: Total I/O In: 315 [Blood:315] Out: -   LAB RESULTS: Recent Labs    09/29/20 2002  WBC 8.1  HGB 7.4*  HCT 26.2*  PLT 293   BMET Lab Results  Component Value Date   NA 136 09/29/2020   NA 140 09/24/2020   NA 136 09/16/2020   K 4.3 09/29/2020   K 4.6 09/24/2020   K 5.0 09/16/2020   CL 102 09/29/2020   CL 103 09/24/2020   CL 98 09/16/2020   CO2 25 09/29/2020   CO2 23 09/24/2020   CO2 29 09/16/2020   GLUCOSE 148 (H) 09/29/2020   GLUCOSE 101 (H) 09/24/2020   GLUCOSE 125 (H) 09/16/2020   BUN 21 09/29/2020   BUN 21 09/24/2020   BUN 32 (H) 09/16/2020   CREATININE 1.57 (H) 09/29/2020   CREATININE 1.42 (H) 09/24/2020   CREATININE 1.45 (H) 09/16/2020   CALCIUM 9.2 09/29/2020   CALCIUM 8.8 09/24/2020   CALCIUM 9.8 09/16/2020   LFT No results for input(s): PROT, ALBUMIN, AST, ALT, ALKPHOS, BILITOT, BILIDIR, IBILI in the last 72 hours. PT/INR Lab Results  Component Value Date   INR 0.9 08/27/2020   Hepatitis Panel No results for input(s): HEPBSAG, HCVAB, HEPAIGM, HEPBIGM in the last 72 hours. C-Diff No components found for: CDIFF Lipase  No results found for: LIPASE  Drugs of Abuse  No results found for: LABOPIA, COCAINSCRNUR, LABBENZ, AMPHETMU, THCU, LABBARB   RADIOLOGY STUDIES: No results found.   IMPRESSION:   *    FOBT positive. History colon polyps on previous screening colonoscopies.  Polyps removed at 12/2015 colonoscopy but unable to access pathology report.  His GI doctor is Dr. Alphonzo Dublin in [facility]. History gastritis, duodenitis and small hiatal hernia on remote EGD.  Compliant with twice daily full dose Protonix. Previous capsule  endoscopy 2019.    *   Acute on chronic anemia.  Getting 1 PRBC. Anemia is not symptomatic.  *     Sinus tachycardia.  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  Switch from DAPT to Eliquis about a week ago due to new finding of atrial flutter. Last dose eliquis was 12/14.      PLAN:     *     Perform EGD tomorrow. Defer colonoscopy back to Dr. Mitchell Heir in [facility]. CBC in AM.   No need for more than Protonix 40 po bid. Heart healthy diet today.     Azucena Freed  09/30/2020, 9:51 AM Phone [phone]   Attending physician's note   I have taken a history, examined the patient and reviewed the chart. I agree with the Advanced Practitioner's note, impression and recommendations.  64 year old male with multiple comorbidities, morbid obesity, OSA, stage III CKD, pulmonary hypertension, severe aortic stenosis s/p TAVR, a flutter on Eliquis  Admitted with worsening of chronic iron deficiency anemia  Has had work-up as outpatient for chronic iron deficiency at [facility], unable to retrieve all the reports in Care Everywhere  Hemoglobin 7.4 from baseline 10. Fecal Hemoccult positive. No overt bleeding  We will plan to proceed with EGD to exclude any high risk lesions. If negative okay to discharge home tomorrow and follow-up with PMD and outpatient GI with routine monitoring of CBC  and iron panel every 4 weeks with IV iron therapy as needed  Continue Protonix p.o. twice daily N.p.o. after midnight  The risks and benefits as well as alternatives of endoscopic procedure(s) have been discussed and reviewed. All questions answered. The patient agrees to proceed.   The patient was provided an opportunity to ask questions and all were answered. The patient agreed with the plan and demonstrated an understanding of the instructions.  Damaris Hippo , MD [phone]

## 2020-09-30 NOTE — Progress Notes (Signed)
   09/30/20 1542  Assess: MEWS Score  Temp 98.4 F (36.9 C)  BP (!) 149/120  Pulse Rate (!) 112  Resp 19  O2 Device Nasal Cannula  Assess: MEWS Score  MEWS Temp 0  MEWS Systolic 0  MEWS Pulse 2  MEWS RR 0  MEWS LOC 0  MEWS Score 2  MEWS Score Color Yellow  Assess: if the MEWS score is Yellow or Red  Were vital signs taken at a resting state? Yes  Focused Assessment No change from prior assessment  Early Detection of Sepsis Score *See Row Information* Medium  MEWS guidelines implemented *See Row Information* Yes  Treat  MEWS Interventions Escalated (See documentation below)  Pain Scale 0-10  Pain Score 4  Pain Type Chronic pain  Pain Location Knee  Pain Orientation Right;Left  Patients Stated Pain Goal 0  Pain Intervention(s) Relaxation  Take Vital Signs  Increase Vital Sign Frequency  Yellow: Q 2hr X 2 then Q 4hr X 2, if remains yellow, continue Q 4hrs  Escalate  MEWS: Escalate Yellow: discuss with charge nurse/RN and consider discussing with provider and RRT  Notify: Charge Nurse/RN  Name of Charge Nurse/RN Notified Aurea Graff  Date Charge Nurse/RN Notified 09/30/20  Time Charge Nurse/RN Notified 1607

## 2020-09-30 NOTE — ED Notes (Signed)
Patient given bagged lunch, lunch tray not brought.   Patient wears CPAP at home, on 2L [place] at this time while sleeping.

## 2020-09-30 NOTE — ED Notes (Signed)
Consent for blood transfusion signed by patient, with known risks and consequences.

## 2020-09-30 NOTE — Progress Notes (Signed)
Received patient with a yellow mews due to tachycardia, patient has diagnosis of A.Fib and MD is aware.

## 2020-10-01 ENCOUNTER — Inpatient Hospital Stay (HOSPITAL_COMMUNITY): Payer: Medicare Other | Admitting: Anesthesiology

## 2020-10-01 ENCOUNTER — Encounter (HOSPITAL_COMMUNITY): Payer: Self-pay | Admitting: Family Medicine

## 2020-10-01 ENCOUNTER — Encounter (HOSPITAL_COMMUNITY)
Admission: EM | Disposition: A | Discharge status: Home / Self Care | Payer: Self-pay | Source: Home / Self Care | Attending: Internal Medicine

## 2020-10-01 DIAGNOSIS — D500 Iron deficiency anemia secondary to blood loss (chronic): Secondary | ICD-10-CM

## 2020-10-01 DIAGNOSIS — R195 Other fecal abnormalities: Secondary | ICD-10-CM | POA: Diagnosis not present

## 2020-10-01 DIAGNOSIS — N1831 Chronic kidney disease, stage 3a: Secondary | ICD-10-CM | POA: Diagnosis not present

## 2020-10-01 DIAGNOSIS — K254 Chronic or unspecified gastric ulcer with hemorrhage: Secondary | ICD-10-CM | POA: Diagnosis not present

## 2020-10-01 DIAGNOSIS — K922 Gastrointestinal hemorrhage, unspecified: Secondary | ICD-10-CM

## 2020-10-01 DIAGNOSIS — K2960 Other gastritis without bleeding: Secondary | ICD-10-CM | POA: Diagnosis not present

## 2020-10-01 DIAGNOSIS — D62 Acute posthemorrhagic anemia: Secondary | ICD-10-CM | POA: Diagnosis not present

## 2020-10-01 DIAGNOSIS — I4892 Unspecified atrial flutter: Secondary | ICD-10-CM | POA: Diagnosis not present

## 2020-10-01 DIAGNOSIS — D509 Iron deficiency anemia, unspecified: Secondary | ICD-10-CM | POA: Diagnosis not present

## 2020-10-01 DIAGNOSIS — Z6841 Body Mass Index (BMI) 40.0 and over, adult: Secondary | ICD-10-CM | POA: Diagnosis not present

## 2020-10-01 HISTORY — PX: ESOPHAGOGASTRODUODENOSCOPY (EGD) WITH PROPOFOL: SHX5813

## 2020-10-01 LAB — TYPE AND SCREEN
ABO/RH(D): A NEG
Antibody Screen: NEGATIVE
Unit division: 0
Unit division: 0

## 2020-10-01 LAB — CBC
HCT: 29.4 % — ABNORMAL LOW (ref 39.0–52.0)
Hemoglobin: 8.9 g/dL — ABNORMAL LOW (ref 13.0–17.0)
MCH: 25.6 pg — ABNORMAL LOW (ref 26.0–34.0)
MCHC: 30.3 g/dL (ref 30.0–36.0)
MCV: 84.5 fL (ref 80.0–100.0)
Platelets: 298 10*3/uL (ref 150–400)
RBC: 3.48 MIL/uL — ABNORMAL LOW (ref 4.22–5.81)
RDW: 15.9 % — ABNORMAL HIGH (ref 11.5–15.5)
WBC: 8.4 10*3/uL (ref 4.0–10.5)
nRBC: 0 % (ref 0.0–0.2)

## 2020-10-01 LAB — BPAM RBC
Blood Product Expiration Date: 202201022359
Blood Product Expiration Date: 202201102359
ISSUE DATE / TIME: 202112150508
ISSUE DATE / TIME: 202112151710
Unit Type and Rh: 600
Unit Type and Rh: 600

## 2020-10-01 LAB — BASIC METABOLIC PANEL
Anion gap: 9 (ref 5–15)
BUN: 17 mg/dL (ref 8–23)
CO2: 29 mmol/L (ref 22–32)
Calcium: 9.2 mg/dL (ref 8.9–10.3)
Chloride: 103 mmol/L (ref 98–111)
Creatinine, Ser: 1.52 mg/dL — ABNORMAL HIGH (ref 0.61–1.24)
GFR, Estimated: 50 mL/min — ABNORMAL LOW (ref >60)
Glucose, Bld: 125 mg/dL — ABNORMAL HIGH (ref 70–99)
Potassium: 4.2 mmol/L (ref 3.5–5.1)
Sodium: 141 mmol/L (ref 135–145)

## 2020-10-01 SURGERY — ESOPHAGOGASTRODUODENOSCOPY (EGD) WITH PROPOFOL
Anesthesia: Monitor Anesthesia Care

## 2020-10-01 MED ORDER — PROPOFOL 500 MG/50ML IV EMUL
INTRAVENOUS | Status: DC | PRN
  Administered 2020-10-01: 150 ug/kg/min via INTRAVENOUS

## 2020-10-01 MED ORDER — LACTATED RINGERS IV SOLN: INTRAVENOUS | Status: DC | PRN

## 2020-10-01 MED ORDER — OXYCODONE ER 9 MG PO C12A: 9.0000 mg | EXTENDED_RELEASE_CAPSULE | Freq: Two times a day (BID) | ORAL | Status: DC

## 2020-10-01 MED ORDER — POLYETHYLENE GLYCOL 3350 17 G PO PACK
17.0000 g | PACK | Freq: Every day | ORAL | Status: DC | PRN
  Administered 2020-10-01: 21:00:00 17 g via ORAL
  Filled 2020-10-01: qty 1

## 2020-10-01 MED ORDER — HYDROCODONE-ACETAMINOPHEN 10-325 MG PO TABS
1.0000 | ORAL_TABLET | Freq: Four times a day (QID) | ORAL | Status: DC | PRN
  Administered 2020-10-01: 1 via ORAL
  Filled 2020-10-01: qty 1

## 2020-10-01 MED ORDER — SENNA 8.6 MG PO TABS: 1.0000 | ORAL_TABLET | Freq: Every day | ORAL | Status: DC | PRN

## 2020-10-01 MED ORDER — OXYCODONE HCL ER 10 MG PO T12A
10.0000 mg | EXTENDED_RELEASE_TABLET | Freq: Two times a day (BID) | ORAL | Status: DC
  Administered 2020-10-01 – 2020-10-02 (×2): 10 mg via ORAL
  Filled 2020-10-01 (×2): qty 1

## 2020-10-01 MED ORDER — LACTATED RINGERS IV SOLN: INTRAVENOUS | Status: DC

## 2020-10-01 SURGICAL SUPPLY — 15 items

## 2020-10-01 NOTE — Interval H&P Note (Signed)
History and Physical Interval Note:  10/01/2020 7:36 AM  Lawrence Rose  has presented today for surgery, with the diagnosis of FOBT positive brown stool.  Acute on chronic anemia..  The various methods of treatment have been discussed with the patient and family. After consideration of risks, benefits and other options for treatment, the patient has consented to  Procedure(s): ESOPHAGOGASTRODUODENOSCOPY (EGD) WITH PROPOFOL (N/A) as a surgical intervention.  The patient's history has been reviewed, patient examined, no change in status, stable for surgery.  I have reviewed the patient's chart and labs.  Questions were answered to the patient's satisfaction.     Dyan Labarbera

## 2020-10-01 NOTE — Transfer of Care (Signed)
Immediate Anesthesia Transfer of Care Note  Patient: Lawrence Rose  Procedure(s) Performed: ESOPHAGOGASTRODUODENOSCOPY (EGD) WITH PROPOFOL (N/A )  Patient Location: Endoscopy Unit  Anesthesia Type:General  Level of Consciousness: awake and alert   Airway & Oxygen Therapy: Patient Spontanous Breathing and Patient connected to face mask oxygen  Post-op Assessment: Report given to RN and Post -op Vital signs reviewed and stable  Post vital signs: Reviewed and stable  Last Vitals:  Vitals Value Taken Time  BP    Temp    Pulse    Resp    SpO2      Last Pain:  Vitals:   10/01/20 0707  TempSrc: Oral  PainSc: 0-No pain      Patients Stated Pain Goal: 0 (09/30/20 1542)  Complications: No complications documented.

## 2020-10-01 NOTE — Progress Notes (Signed)
MEWS Yellow  Given morning meds and retook vitals, MEWS is now green. MD sent secured chat  10/01/20 1138  Assess: MEWS Score  Temp 98.2 F (36.8 C)  BP 138/87  Pulse Rate 79  Resp 18  Level of Consciousness Alert  O2 Device Room Air  Assess: MEWS Score  MEWS Temp 0  MEWS Systolic 0  MEWS Pulse 0  MEWS RR 0  MEWS LOC 0  MEWS Score 0  MEWS Score Color Green  Assess: if the MEWS score is Yellow or Red  Were vital signs taken at a resting state? Yes  Focused Assessment No change from prior assessment  Early Detection of Sepsis Score *See Row Information* Low  MEWS guidelines implemented *See Row Information* No, vital signs rechecked  Treat  MEWS Interventions Administered scheduled meds/treatments  Pain Scale 0-10  Pain Score 0  Notify: Charge Nurse/RN  Date Charge Nurse/RN Notified 10/01/20  Notify: Provider  Provider Name/Title Donnel Saxon, MD  Date Provider Notified 10/01/20  Time Provider Notified 1210  Notification Type  (secure chat)  Notification Reason Other (Comment) (FYI)  Response No new orders

## 2020-10-01 NOTE — Anesthesia Postprocedure Evaluation (Signed)
Anesthesia Post Note  Patient: Earley Grobe  Procedure(s) Performed: ESOPHAGOGASTRODUODENOSCOPY (EGD) WITH PROPOFOL (N/A )     Patient location during evaluation: Endoscopy Anesthesia Type: MAC Level of consciousness: awake and alert Pain management: pain level controlled Vital Signs Assessment: post-procedure vital signs reviewed and stable Respiratory status: spontaneous breathing, nonlabored ventilation and respiratory function stable Cardiovascular status: blood pressure returned to baseline and stable Postop Assessment: no apparent nausea or vomiting Anesthetic complications: no   No complications documented.  Last Vitals:  Vitals:   10/01/20 0836 10/01/20 0925  BP: (!) 148/82 (!) 160/91  Pulse: (!) 110   Resp: 18   Temp: 36.7 C   SpO2: 98%     Last Pain:  Vitals:   10/01/20 0836  TempSrc: Oral  PainSc:                  Lidia Collum

## 2020-10-01 NOTE — Op Note (Signed)
[facility] Patient Name: Lawrence Rose Procedure Date : 10/01/2020 MRN: [id number] Attending MD: Napoleon Form , MD Date of Birth: [birth year] CSN: [id number] Age: 64 Admit Type: Inpatient Procedure:                Upper GI endoscopy Indications:              Gastrointestinal bleeding of unknown origin,                            Suspected upper gastrointestinal bleeding in                            patient with unexplained iron deficiency anemia Providers:                Napoleon Form, MD, Margaree Mackintosh, RN,                            Rozetta Nunnery, Technician Referring MD:              Medicines:                Monitored Anesthesia Care Complications:            No immediate complications. Estimated Blood Loss:     Estimated blood loss was minimal. Procedure:                Pre-Anesthesia Assessment:                           - Prior to the procedure, a History and Physical                            was performed, and patient medications and                            allergies were reviewed. The patient's tolerance of                            previous anesthesia was also reviewed. The risks                            and benefits of the procedure and the sedation                            options and risks were discussed with the patient.                            All questions were answered, and informed consent                            was obtained. Prior Anticoagulants: The patient has                            taken no previous anticoagulant or antiplatelet  agents. ASA Grade Assessment: III - A patient with                            severe systemic disease. After reviewing the risks                            and benefits, the patient was deemed in                            satisfactory condition to undergo the procedure.                           After obtaining informed consent, the endoscope was                             passed under direct vision. Throughout the                            procedure, the patient's blood pressure, pulse, and                            oxygen saturations were monitored continuously. The                            GIF-H190 ([id number] Olympus gastroscope was                            introduced through the mouth, and advanced to the                            second part of duodenum. The upper GI endoscopy was                            accomplished without difficulty. The patient                            tolerated the procedure well. Scope In: Scope Out: Findings:      The Z-line was regular and was found 38 cm from the incisors.      No gross lesions were noted in the entire esophagus.      Patchy mild inflammation characterized by erosions was found in the       gastric antrum and in the prepyloric region of the stomach.      The cardia and gastric fundus were normal on retroflexion.      The examined duodenum was normal. Impression:               - Z-line regular, 38 cm from the incisors.                           - No gross lesions in esophagus.                           - Erosive gastritis.                           -  Normal examined duodenum.                           - No specimens collected. Recommendation:           - Patient has a contact number available for                            emergencies. The signs and symptoms of potential                            delayed complications were discussed with the                            patient. Return to normal activities tomorrow.                            Written discharge instructions were provided to the                            patient.                           - Resume previous diet.                           - Continue present medications.                           - Protonix 40mg  PO BID                           - No ibuprofen, naproxen, or other non-steroidal                             anti-inflammatory drugs.                           - OK to discharge home from GI standpoint, follow                            up outpatient PMD and GI in [place]                           - Will need routine monitoring of CBC and iron                            panel every 4 weeks as outpatient with IV iron                            therapy as needed                           - GI will sign off, please call with any questions Procedure Code(s):        --- Professional ---  43235, [id number], flexible,                            transoral; diagnostic, including collection of                            specimen(s) by brushing or washing, when performed                            (separate procedure) Diagnosis Code(s):        --- Professional ---                           K29.60, Other gastritis without bleeding                           K92.2, Gastrointestinal hemorrhage, unspecified                           D50.9, Iron deficiency anemia, unspecified CPT copyright 2019 American Medical Association. All rights reserved. The codes documented in this report are preliminary and upon coder review may  be revised to meet current compliance requirements. Napoleon FormKavitha V. Joseth Weigel, MD 10/01/2020 8:17:23 AM This report has been signed electronically. Number of Addenda: 0

## 2020-10-01 NOTE — Progress Notes (Signed)
Progress Note    Lawrence Rose  YBO:[id number] DOB: May 12, 1956  DOA: 09/29/2020 PCP: Lenox Ponds, MD      Brief Narrative:    Medical records reviewed and are as summarized below:  Lawrence Rose is a 64 y.o. male       Assessment/Plan:   Principal Problem:   GI bleed Active Problems:   CKD (chronic kidney disease) stage 3, GFR 30-59 ml/min (HCC)   OSA on CPAP   Obesity, Class III, BMI 40-49.9 (morbid obesity) (HCC)   Anemia   Chronic anticoagulation   Atrial flutter (HCC)   Diabetes mellitus type 2 in obese (HCC)   Occult blood positive stool    Body mass index is 44.58 kg/m.  (Morbid obesity)   Positive heme stools/occult GI bleeding: s/p EGD today which revealed erosive esophagitis.  Continue oral Protonix.  Acute blood loss anemia: s/p transfusion with 2 units of packed red blood cells.  H&H is stable.  Continue to monitor H&H.  AKI on CKD stage 3a: This may be prerenal from volume depletion from blood loss.  Creatinine was normal (0.9) 1 month ago on 09/01/2020.  Start IV fluids (Ringer's lactate infusion) repeat creatinine tomorrow.  Paroxysmal atrial flutter, severe aortic stenosis s/p on 09/01/2020: Resume Eliquis tonight.  OSA: Continue CPAP at night   Diet Order            Diet Heart Room service appropriate? Yes; Fluid consistency: Thin  Diet effective now                    Consultants:  Gastroenterologist  Procedures:  EGD on 10/01/2020    Medications:   . amiodarone  400 mg Oral BID  . diltiazem  240 mg Oral Daily  . gabapentin  600 mg Oral BID  . latanoprost  1 drop Both Eyes QHS  . metoprolol tartrate  25 mg Oral BID  . pantoprazole  40 mg Oral BID  . rosuvastatin  20 mg Oral Daily   Continuous Infusions: . lactated ringers 75 mL/hr at 10/01/20 1338     Anti-infectives (From admission, onward)   None             Family Communication/Anticipated D/C date and plan/Code Status   DVT  prophylaxis: SCDs Start: 09/30/20 2778     Code Status: Full Code  Family Communication: Family (?brother) was at the bedside Disposition Plan:    Status is: Inpatient  Remains inpatient appropriate because:IV treatments appropriate due to intensity of illness or inability to take PO   Dispo: The patient is from: Home              Anticipated d/c is to: Home              Anticipated d/c date is: 1 day              Patient currently is not medically stable to d/c.           Subjective:   Interval events noted. No abdominal pain or rectal bleeding.  Objective:    Vitals:   10/01/20 0819 10/01/20 0836 10/01/20 0925 10/01/20 1138  BP: (!) 163/88 (!) 148/82 (!) 160/91 138/87  Pulse: (!) 122 (!) 110  79  Resp: 18 18 20 18   Temp:  98.1 F (36.7 C)  98.2 F (36.8 C)  TempSrc:  Oral  Oral  SpO2: 99% 98%    Weight:      Height:  No data found.   Intake/Output Summary (Last 24 hours) at 10/01/2020 1418 Last data filed at 10/01/2020 1012 Gross per 24 hour  Intake 1728.33 ml  Output 2725 ml  Net -996.67 ml   Filed Weights   10/01/20 0707  Weight: (!) 145 kg    Exam:  GEN: NAD SKIN: Warm and dry EYES: No pallor or icterus ENT: MMM CV: RRR PULM: CTA B ABD: soft, obese, NT, +BS CNS: AAO x 3, non focal EXT: No edema or tenderness   Data Reviewed:   I have personally reviewed following labs and imaging studies:  Labs: Labs show the following:   Basic Metabolic Panel: Recent Labs  Lab 09/24/20 1420 09/29/20 2002 10/01/20 0205  NA 140 136 141  K 4.6 4.3 4.2  CL 103 102 103  CO2 23 25 29   GLUCOSE 101* 148* 125*  BUN 21 21 17   CREATININE 1.42* 1.57* 1.52*  CALCIUM 8.8 9.2 9.2   GFR Estimated Creatinine Clearance: 69.8 mL/min (A) (by C-G formula based on SCr of 1.52 mg/dL (H)). Liver Function Tests: No results for input(s): AST, ALT, ALKPHOS, BILITOT, PROT, ALBUMIN in the last 168 hours. No results for input(s): LIPASE, AMYLASE in  the last 168 hours. No results for input(s): AMMONIA in the last 168 hours. Coagulation profile No results for input(s): INR, PROTIME in the last 168 hours.  CBC: Recent Labs  Lab 09/24/20 1420 09/29/20 2002 09/30/20 1039 10/01/20 0205  WBC 11.0* 8.1  --  8.4  NEUTROABS 8.6*  --   --   --   HGB 7.4* 7.4* 7.9* 8.9*  HCT 23.8* 26.2* 26.9* 29.4*  MCV 82 87.6  --  84.5  PLT 232 293  --  298   Cardiac Enzymes: No results for input(s): CKTOTAL, CKMB, CKMBINDEX, TROPONINI in the last 168 hours. BNP (last 3 results) No results for input(s): PROBNP in the last 8760 hours. CBG: No results for input(s): GLUCAP in the last 168 hours. D-Dimer: No results for input(s): DDIMER in the last 72 hours. Hgb A1c: No results for input(s): HGBA1C in the last 72 hours. Lipid Profile: No results for input(s): CHOL, HDL, LDLCALC, TRIG, CHOLHDL, LDLDIRECT in the last 72 hours. Thyroid function studies: No results for input(s): TSH, T4TOTAL, T3FREE, THYROIDAB in the last 72 hours.  Invalid input(s): FREET3 Anemia work up: No results for input(s): VITAMINB12, FOLATE, FERRITIN, TIBC, IRON, RETICCTPCT in the last 72 hours. Sepsis Labs: Recent Labs  Lab 09/24/20 1420 09/29/20 2002 10/01/20 0205  WBC 11.0* 8.1 8.4    Microbiology Recent Results (from the past 240 hour(s))  Resp Panel by RT-PCR (Flu A&B, Covid) Nasopharyngeal Swab     Status: None   Collection Time: 09/30/20  4:49 AM   Specimen: Nasopharyngeal Swab; Nasopharyngeal(NP) swabs in vial transport medium  Result Value Ref Range Status   SARS Coronavirus 2 by RT PCR NEGATIVE NEGATIVE Final    Comment: (NOTE) SARS-CoV-2 target nucleic acids are NOT DETECTED.  The SARS-CoV-2 RNA is generally detectable in upper respiratory specimens during the acute phase of infection. The lowest concentration of SARS-CoV-2 viral copies this assay can detect is 138 copies/mL. A negative result does not preclude SARS-Cov-2 infection and should not be  used as the sole basis for treatment or other patient management decisions. A negative result may occur with  improper specimen collection/handling, submission of specimen other than nasopharyngeal swab, presence of viral mutation(s) within the areas targeted by this assay, and inadequate number of viral copies(<138 copies/mL).  A negative result must be combined with clinical observations, patient history, and epidemiological information. The expected result is Negative.  Fact Sheet for Patients:  BloggerCourse.com  Fact Sheet for Healthcare Providers:  SeriousBroker.it  This test is no t yet approved or cleared by the [place] FDA and  has been authorized for detection and/or diagnosis of SARS-CoV-2 by FDA under an Emergency Use Authorization (EUA). This EUA will remain  in effect (meaning this test can be used) for the duration of the COVID-19 declaration under Section 564(b)(1) of the Act, 21 U.S.C.section 360bbb-3(b)(1), unless the authorization is terminated  or revoked sooner.       Influenza A by PCR NEGATIVE NEGATIVE Final   Influenza B by PCR NEGATIVE NEGATIVE Final    Comment: (NOTE) The Xpert Xpress SARS-CoV-2/FLU/RSV plus assay is intended as an aid in the diagnosis of influenza from Nasopharyngeal swab specimens and should not be used as a sole basis for treatment. Nasal washings and aspirates are unacceptable for Xpert Xpress SARS-CoV-2/FLU/RSV testing.  Fact Sheet for Patients: BloggerCourse.com  Fact Sheet for Healthcare Providers: SeriousBroker.it  This test is not yet approved or cleared by the [place] FDA and has been authorized for detection and/or diagnosis of SARS-CoV-2 by FDA under an Emergency Use Authorization (EUA). This EUA will remain in effect (meaning this test can be used) for the duration of the COVID-19 declaration under Section  564(b)(1) of the Act, 21 U.S.C. section 360bbb-3(b)(1), unless the authorization is terminated or revoked.  Performed at [facility], [address], [place], [place] [address]     Procedures and diagnostic studies:  ECHOCARDIOGRAM COMPLETE  Result Date: 09/30/2020    ECHOCARDIOGRAM REPORT   Patient Name:   KIMI KROFT Date of Exam: 09/30/2020 Medical Rec #:  [id number]      Height:       71.0 in Accession #:    [id number]     Weight:       315.0 lb Date of Birth:  [birth year]      BSA:          2.558 m Patient Age:    64 years       BP:           147/94 mmHg Patient Gender: M              HR:           96 bpm. Exam Location:  Inpatient Procedure: 2D Echo, Cardiac Doppler, Color Doppler and Intracardiac            Opacification Agent Indications:    S/P TAVR (transcatheter aortic valve replacement) [[id number]]  History:        Patient has prior history of Echocardiogram examinations, most                 recent 09/16/2020. COPD; Risk Factors:Sleep Apnea, Hypertension,                 Diabetes and Former Smoker.                 Aortic Valve: 29 mm CoreValve-Evolut Pro prosthetic, stented                 (TAVR) valve is present in the aortic position. Procedure Date:                 09/01/2020.  Sonographer:    Renella Cunas RDCS Referring Phys: [id number] KATHRYN R THOMPSON IMPRESSIONS  1. Left  ventricular ejection fraction, by estimation, is 55 to 60%. The left ventricle has normal function. The left ventricle has no regional wall motion abnormalities. Left ventricular diastolic parameters are indeterminate.  2. Right ventricular systolic function is normal. The right ventricular size is normal. There is normal pulmonary artery systolic pressure. The estimated right ventricular systolic pressure is 31.6 mmHg.  3. The mitral valve is normal in structure. Trivial mitral valve regurgitation. No evidence of mitral stenosis.  4. The aortic valve has been repaired/replaced. Perivalvular Aortic valve  regurgitation is not visualized. No aortic stenosis is present. There is a 29 mm CoreValve-Evolut Pro prosthetic (TAVR) valve present in the aortic position. Procedure Date: 09/01/2020. Echo findings are consistent with normal structure and function of the aortic valve prosthesis. Aortic valve area, by VTI measures 2.40 cm. Aortic valve mean gradient measures 7.0 mmHg. Aortic valve Vmax measures 1.88 m/s. Diminsionless index 0.49.  5. The inferior vena cava is dilated in size with <50% respiratory variability, suggesting right atrial pressure of 15 mmHg. FINDINGS  Left Ventricle: Left ventricular ejection fraction, by estimation, is 55 to 60%. The left ventricle has normal function. The left ventricle has no regional wall motion abnormalities. Definity contrast agent was given IV to delineate the left ventricular  endocardial borders. The left ventricular internal cavity size was normal in size. There is no left ventricular hypertrophy. Left ventricular diastolic parameters are indeterminate. Right Ventricle: The right ventricular size is normal. No increase in right ventricular wall thickness. Right ventricular systolic function is normal. There is normal pulmonary artery systolic pressure. The tricuspid regurgitant velocity is 2.04 m/s, and  with an assumed right atrial pressure of 15 mmHg, the estimated right ventricular systolic pressure is 31.6 mmHg. Left Atrium: Left atrial size was normal in size. Right Atrium: Right atrial size was normal in size. Pericardium: There is no evidence of pericardial effusion. Mitral Valve: The mitral valve is normal in structure. Mild to moderate mitral annular calcification. Trivial mitral valve regurgitation. No evidence of mitral valve stenosis. Tricuspid Valve: The tricuspid valve is normal in structure. Tricuspid valve regurgitation is trivial. No evidence of tricuspid stenosis. Aortic Valve: The aortic valve has been repaired/replaced. Aortic valve regurgitation is not  visualized. No aortic stenosis is present. Aortic valve mean gradient measures 7.0 mmHg. Aortic valve peak gradient measures 14.1 mmHg. Aortic valve area, by VTI  measures 2.40 cm. There is a 29 mm CoreValve-Evolut Pro prosthetic, stented (TAVR) valve present in the aortic position. Procedure Date: 09/01/2020. Echo findings are consistent with normal structure and function of the aortic valve prosthesis. Pulmonic Valve: The pulmonic valve was normal in structure. Pulmonic valve regurgitation is not visualized. No evidence of pulmonic stenosis. Aorta: The aortic root is normal in size and structure. Venous: The inferior vena cava is dilated in size with less than 50% respiratory variability, suggesting right atrial pressure of 15 mmHg. IAS/Shunts: No atrial level shunt detected by color flow Doppler.  LEFT VENTRICLE PLAX 2D LVIDd:         4.60 cm LVIDs:         3.30 cm LV PW:         0.90 cm LV IVS:        0.90 cm LVOT diam:     2.50 cm LV SV:         76 LV SV Index:   30 LVOT Area:     4.91 cm  LV Volumes (MOD) LV vol d, MOD A4C: 113.0 ml LV vol  s, MOD A4C: 44.3 ml LV SV MOD A4C:     113.0 ml RIGHT VENTRICLE TAPSE (M-mode): 1.8 cm LEFT ATRIUM             Index       RIGHT ATRIUM           Index LA diam:        4.40 cm 1.72 cm/m  RA Area:     15.30 cm LA Vol (A2C):   56.6 ml 22.13 ml/m RA Volume:   32.60 ml  12.75 ml/m LA Vol (A4C):   47.8 ml 18.69 ml/m LA Biplane Vol: 52.2 ml 20.41 ml/m  AORTIC VALVE AV Area (Vmax):    2.05 cm AV Area (Vmean):   2.23 cm AV Area (VTI):     2.40 cm AV Vmax:           187.50 cm/s AV Vmean:          120.000 cm/s AV VTI:            0.315 m AV Peak Grad:      14.1 mmHg AV Mean Grad:      7.0 mmHg LVOT Vmax:         78.30 cm/s LVOT Vmean:        54.600 cm/s LVOT VTI:          0.154 m LVOT/AV VTI ratio: 0.49  AORTA Ao Asc diam: 3.40 cm MITRAL VALVE                TRICUSPID VALVE MV Area (PHT): 4.49 cm     TR Peak grad:   16.6 mmHg MV Decel Time: 169 msec     TR Vmax:         204.00 cm/s MV E velocity: 146.00 cm/s MV A velocity: 107.00 cm/s  SHUNTS MV E/A ratio:  1.36         Systemic VTI:  0.15 m                             Systemic Diam: 2.50 cm Armanda Magicraci Turner MD Electronically signed by Armanda Magicraci Turner MD Signature Date/Time: 09/30/2020/4:21:00 PM    Final                LOS: 1 day   Marguerette Sheller  [facility]   Pager on [email or web address]. If 7PM-7AM, please contact night-coverage at [email or web address]     10/01/2020, 2:18 PM

## 2020-10-02 ENCOUNTER — Encounter (HOSPITAL_COMMUNITY): Payer: Self-pay | Admitting: Gastroenterology

## 2020-10-02 DIAGNOSIS — E669 Obesity, unspecified: Secondary | ICD-10-CM

## 2020-10-02 DIAGNOSIS — K254 Chronic or unspecified gastric ulcer with hemorrhage: Secondary | ICD-10-CM | POA: Diagnosis not present

## 2020-10-02 DIAGNOSIS — D509 Iron deficiency anemia, unspecified: Secondary | ICD-10-CM | POA: Diagnosis not present

## 2020-10-02 DIAGNOSIS — K922 Gastrointestinal hemorrhage, unspecified: Secondary | ICD-10-CM | POA: Diagnosis not present

## 2020-10-02 DIAGNOSIS — E1169 Type 2 diabetes mellitus with other specified complication: Secondary | ICD-10-CM

## 2020-10-02 DIAGNOSIS — N1831 Chronic kidney disease, stage 3a: Secondary | ICD-10-CM | POA: Diagnosis not present

## 2020-10-02 LAB — CBC WITH DIFFERENTIAL/PLATELET
Abs Immature Granulocytes: 0.07 10*3/uL (ref 0.00–0.07)
Basophils Absolute: 0.1 10*3/uL (ref 0.0–0.1)
Basophils Relative: 1 %
Eosinophils Absolute: 0.2 10*3/uL (ref 0.0–0.5)
Eosinophils Relative: 2 %
HCT: 28.7 % — ABNORMAL LOW (ref 39.0–52.0)
Hemoglobin: 9 g/dL — ABNORMAL LOW (ref 13.0–17.0)
Immature Granulocytes: 1 %
Lymphocytes Relative: 18 %
Lymphs Abs: 1.5 10*3/uL (ref 0.7–4.0)
MCH: 26 pg (ref 26.0–34.0)
MCHC: 31.4 g/dL (ref 30.0–36.0)
MCV: 82.9 fL (ref 80.0–100.0)
Monocytes Absolute: 0.9 10*3/uL (ref 0.1–1.0)
Monocytes Relative: 11 %
Neutro Abs: 5.7 10*3/uL (ref 1.7–7.7)
Neutrophils Relative %: 67 %
Platelets: 308 10*3/uL (ref 150–400)
RBC: 3.46 MIL/uL — ABNORMAL LOW (ref 4.22–5.81)
RDW: 16.1 % — ABNORMAL HIGH (ref 11.5–15.5)
WBC: 8.4 10*3/uL (ref 4.0–10.5)
nRBC: 0 % (ref 0.0–0.2)

## 2020-10-02 LAB — BASIC METABOLIC PANEL
Anion gap: 10 (ref 5–15)
BUN: 16 mg/dL (ref 8–23)
CO2: 26 mmol/L (ref 22–32)
Calcium: 8.8 mg/dL — ABNORMAL LOW (ref 8.9–10.3)
Chloride: 100 mmol/L (ref 98–111)
Creatinine, Ser: 1.36 mg/dL — ABNORMAL HIGH (ref 0.61–1.24)
GFR, Estimated: 57 mL/min — ABNORMAL LOW (ref >60)
Glucose, Bld: 148 mg/dL — ABNORMAL HIGH (ref 70–99)
Potassium: 4.1 mmol/L (ref 3.5–5.1)
Sodium: 136 mmol/L (ref 135–145)

## 2020-10-02 NOTE — Progress Notes (Signed)
Lawrence Fearing McCollumto be D/C'd per MD order. Discussed with the patient and all questions fully answered. ? VSS, Skin clean, dry and intact without evidence of skin break down, no evidence of skin tears noted. ? IV catheter discontinued intact. Site without signs and symptoms of complications. Dressing and pressure applied. ? An After Visit Summary was printed and given to the patient. Patient informed where to pickup prescriptions. ? D/c education completed with patient/family including follow up instructions, medication list, d/c activities limitations if indicated, with other d/c instructions as indicated by MD - patient able to verbalize understanding, all questions fully answered.  ? Patient instructed to return to ED, call 911, or call MD for any changes in condition.  ? Patient to be escorted via WC, and D/C home via private auto.

## 2020-10-02 NOTE — Discharge Summary (Signed)
Physician Discharge Summary  Lawrence Rose PHX:[id number] DOB: Oct 25, 1955 DOA: 09/29/2020  PCP: Doreatha Lew, MD  Admit date: 09/29/2020 Discharge date: 10/02/2020  Discharge disposition: Home   Recommendations for Outpatient Follow-Up:   Follow-up with PCP in 1 week   Discharge Diagnosis:   Principal Problem:   GI bleed Active Problems:   CKD (chronic kidney disease) stage 3, GFR 30-59 ml/min (HCC)   OSA on CPAP   Obesity, Class III, BMI 40-49.9 (morbid obesity) (HCC)   Anemia   Chronic anticoagulation   Atrial flutter (HCC)   Diabetes mellitus type 2 in obese ([place])   Occult blood positive stool    Discharge Condition: Stable.  Diet recommendation:  Diet Order            Diet - low sodium heart healthy           Diet Carb Modified           Diet Heart Room service appropriate? Yes; Fluid consistency: Thin  Diet effective now                   Code Status: Full Code     Hospital Course:   [name] is a 64 y.o. male with medical history significant for  HTN, HLD, anemia,morbid obesity,chronic back and knee pain with limited mobility,OSAon CPAP, CKD stage IIIa, pulmonary HTN, sick euthyroid syndrome, severe aortic stenosiss/p TAVR (09/01/20).  He was seen at the cardiology office on 09/24/2020 where he was found to have atrial flutter.  Eliquis was substituted for  with aspirin and Plavix.  [facility].  He was called by his PCP at go to the emergency room for evaluation of low hemoglobin.  His hemoglobin was 7.4 in the emergency room.  FOBT test was positive in the ED.  On follow-up, his hemoglobin was 10.9 on 09/01/2020 and 8.5 on 09/16/2020.  He was admitted to the hospital for occult GI bleeding and acute blood loss anemia. He was treated with IV Protonix and he was transfused with 2 units of packed red blood cells.  Gastroenterologist was consulted and he underwent EGD on 10/01/2020 which showed erosive  esophagitis.  H&H remained stable posttransfusion.  He also had AKI which was treated with IV fluids.  His condition has improved and is deemed stable for discharge to home today.   Medical Consultants:    Gastroenterologist   Discharge Exam:    Vitals:   10/01/20 1934 10/01/20 2240 10/02/20 0047 10/02/20 0535  BP: (!) 150/79  (!) 142/75 128/71  Pulse: 83 68 70 64  Resp: '18 16 17 18  ' Temp: 99.1 F (37.3 C)  98.9 F (37.2 C) 98.5 F (36.9 C)  TempSrc: Oral  Oral Oral  SpO2: 97% 97% 98% 99%  Weight:      Height:         GEN: NAD SKIN: Warm and dry EYES: No pallor or icterus ENT: MMM CV: RRR PULM: CTA B ABD: soft, ND, NT, +BS CNS: AAO x 3, non focal EXT: No edema or tenderness   The results of significant diagnostics from this hospitalization (including imaging, microbiology, ancillary and laboratory) are listed below for reference.     Procedures and Diagnostic Studies:   ECHOCARDIOGRAM COMPLETE  Result Date: 09/30/2020    ECHOCARDIOGRAM REPORT   [name] Date of Exam: 09/30/2020 Medical Rec #:  [id number]      Height:  71.0 in Accession #:    [id number]     Weight:       315.0 lb Date of Birth:  09-Apr-1956      BSA:          2.558 m Patient Age:    64 years       BP:           147/94 mmHg Patient Gender: M              HR:           96 bpm. Exam Location:  Inpatient Procedure: 2D Echo, Cardiac Doppler, Color Doppler and Intracardiac            Opacification Agent Indications:    S/P TAVR (transcatheter aortic valve replacement) [[id number]]  History:        Patient has prior history of Echocardiogram examinations, most                 recent 09/16/2020. COPD; Risk Factors:Sleep Apnea, Hypertension,                 Diabetes and Former Smoker.                 Aortic Valve: 29 mm CoreValve-Evolut Pro prosthetic, stented                 (TAVR) valve is present in the aortic position. Procedure Date:                 09/01/2020.  Sonographer:    Vickie Epley RDCS Referring Phys: [id number] [place]  1. Left ventricular ejection fraction, by estimation, is 55 to 60%. The left ventricle has normal function. The left ventricle has no regional wall motion abnormalities. Left ventricular diastolic parameters are indeterminate.  2. Right ventricular systolic function is normal. The right ventricular size is normal. There is normal pulmonary artery systolic pressure. The estimated right ventricular systolic pressure is 58.5 mmHg.  3. The mitral valve is normal in structure. Trivial mitral valve regurgitation. No evidence of mitral stenosis.  4. The aortic valve has been repaired/replaced. Perivalvular Aortic valve regurgitation is not visualized. No aortic stenosis is present. There is a 29 mm CoreValve-Evolut Pro prosthetic (TAVR) valve present in the aortic position. Procedure Date: 09/01/2020. Echo findings are consistent with normal structure and function of the aortic valve prosthesis. Aortic valve area, by VTI measures 2.40 cm. Aortic valve mean gradient measures 7.0 mmHg. Aortic valve Vmax measures 1.88 m/s. Diminsionless index 0.49.  5. The inferior vena cava is dilated in size with <50% respiratory variability, suggesting right atrial pressure of 15 mmHg. FINDINGS  Left Ventricle: Left ventricular ejection fraction, by estimation, is 55 to 60%. The left ventricle has normal function. The left ventricle has no regional wall motion abnormalities. Definity contrast agent was given IV to delineate the left ventricular  endocardial borders. The left ventricular internal cavity size was normal in size. There is no left ventricular hypertrophy. Left ventricular diastolic parameters are indeterminate. Right Ventricle: The right ventricular size is normal. No increase in right ventricular wall thickness. Right ventricular systolic function is normal. There is normal pulmonary artery systolic pressure. The tricuspid regurgitant velocity is 2.04 m/s,  and  with an assumed right atrial pressure of 15 mmHg, the estimated right ventricular systolic pressure is 92.9 mmHg. Left Atrium: Left atrial size was normal in size. Right Atrium: Right atrial size was normal in size. Pericardium: There is no evidence  of pericardial effusion. Mitral Valve: The mitral valve is normal in structure. Mild to moderate mitral annular calcification. Trivial mitral valve regurgitation. No evidence of mitral valve stenosis. Tricuspid Valve: The tricuspid valve is normal in structure. Tricuspid valve regurgitation is trivial. No evidence of tricuspid stenosis. Aortic Valve: The aortic valve has been repaired/replaced. Aortic valve regurgitation is not visualized. No aortic stenosis is present. Aortic valve mean gradient measures 7.0 mmHg. Aortic valve peak gradient measures 14.1 mmHg. Aortic valve area, by VTI  measures 2.40 cm. There is a 29 mm CoreValve-Evolut Pro prosthetic, stented (TAVR) valve present in the aortic position. Procedure Date: 09/01/2020. Echo findings are consistent with normal structure and function of the aortic valve prosthesis. Pulmonic Valve: The pulmonic valve was normal in structure. Pulmonic valve regurgitation is not visualized. No evidence of pulmonic stenosis. Aorta: The aortic root is normal in size and structure. Venous: The inferior vena cava is dilated in size with less than 50% respiratory variability, suggesting right atrial pressure of 15 mmHg. IAS/Shunts: No atrial level shunt detected by color flow Doppler.  LEFT VENTRICLE PLAX 2D LVIDd:         4.60 cm LVIDs:         3.30 cm LV PW:         0.90 cm LV IVS:        0.90 cm LVOT diam:     2.50 cm LV SV:         76 LV SV Index:   30 LVOT Area:     4.91 cm  LV Volumes (MOD) LV vol d, MOD A4C: 113.0 ml LV vol s, MOD A4C: 44.3 ml LV SV MOD A4C:     113.0 ml RIGHT VENTRICLE TAPSE (M-mode): 1.8 cm LEFT ATRIUM             Index       RIGHT ATRIUM           Index LA diam:        4.40 cm 1.72 cm/m  RA Area:      15.30 cm LA Vol (A2C):   56.6 ml 22.13 ml/m RA Volume:   32.60 ml  12.75 ml/m LA Vol (A4C):   47.8 ml 18.69 ml/m LA Biplane Vol: 52.2 ml 20.41 ml/m  AORTIC VALVE AV Area (Vmax):    2.05 cm AV Area (Vmean):   2.23 cm AV Area (VTI):     2.40 cm AV Vmax:           187.50 cm/s AV Vmean:          120.000 cm/s AV VTI:            0.315 m AV Peak Grad:      14.1 mmHg AV Mean Grad:      7.0 mmHg LVOT Vmax:         78.30 cm/s LVOT Vmean:        54.600 cm/s LVOT VTI:          0.154 m LVOT/AV VTI ratio: 0.49  AORTA Ao Asc diam: 3.40 cm MITRAL VALVE                TRICUSPID VALVE MV Area (PHT): 4.49 cm     TR Peak grad:   16.6 mmHg MV Decel Time: 169 msec     TR Vmax:        204.00 cm/s MV E velocity: 146.00 cm/s MV A velocity: 107.00 cm/s  SHUNTS MV E/A ratio:  1.36  Systemic VTI:  0.15 m                             Systemic Diam: 2.50 cm Fransico Him MD Electronically signed by Fransico Him MD Signature Date/Time: 09/30/2020/4:21:00 PM    Final      Labs:   Basic Metabolic Panel: Recent Labs  Lab 09/29/20 2002 10/01/20 0205 10/02/20 0015  NA 136 141 136  K 4.3 4.2 4.1  CL 102 103 100  CO2 '25 29 26  ' GLUCOSE 148* 125* 148*  BUN '21 17 16  ' CREATININE 1.57* 1.52* 1.36*  CALCIUM 9.2 9.2 8.8*   GFR Estimated Creatinine Clearance: 78 mL/min (A) (by C-G formula based on SCr of 1.36 mg/dL (H)). Liver Function Tests: No results for input(s): AST, ALT, ALKPHOS, BILITOT, PROT, ALBUMIN in the last 168 hours. No results for input(s): LIPASE, AMYLASE in the last 168 hours. No results for input(s): AMMONIA in the last 168 hours. Coagulation profile No results for input(s): INR, PROTIME in the last 168 hours.  CBC: Recent Labs  Lab 09/29/20 2002 09/30/20 1039 10/01/20 0205 10/02/20 0015  WBC 8.1  --  8.4 8.4  NEUTROABS  --   --   --  5.7  HGB 7.4* 7.9* 8.9* 9.0*  HCT 26.2* 26.9* 29.4* 28.7*  MCV 87.6  --  84.5 82.9  PLT 293  --  298 308   Cardiac Enzymes: No results for input(s):  CKTOTAL, CKMB, CKMBINDEX, TROPONINI in the last 168 hours. BNP: Invalid input(s): POCBNP CBG: No results for input(s): GLUCAP in the last 168 hours. D-Dimer No results for input(s): DDIMER in the last 72 hours. Hgb A1c No results for input(s): HGBA1C in the last 72 hours. Lipid Profile No results for input(s): CHOL, HDL, LDLCALC, TRIG, CHOLHDL, LDLDIRECT in the last 72 hours. Thyroid function studies No results for input(s): TSH, T4TOTAL, T3FREE, THYROIDAB in the last 72 hours.  Invalid input(s): FREET3 Anemia work up No results for input(s): VITAMINB12, FOLATE, FERRITIN, TIBC, IRON, RETICCTPCT in the last 72 hours. Microbiology Recent Results (from the past 240 hour(s))  Resp Panel by RT-PCR (Flu A&B, Covid) Nasopharyngeal Swab     Status: None   Collection Time: 09/30/20  4:49 AM   Specimen: Nasopharyngeal Swab; Nasopharyngeal(NP) swabs in vial transport medium  Result Value Ref Range Status   SARS Coronavirus 2 by RT PCR NEGATIVE NEGATIVE Final    Comment: (NOTE) SARS-CoV-2 target nucleic acids are NOT DETECTED.  The SARS-CoV-2 RNA is generally detectable in upper respiratory specimens during the acute phase of infection. The lowest concentration of SARS-CoV-2 viral copies this assay can detect is 138 copies/mL. A negative result does not preclude SARS-Cov-2 infection and should not be used as the sole basis for treatment or other patient management decisions. A negative result may occur with  improper specimen collection/handling, submission of specimen other than nasopharyngeal swab, presence of viral mutation(s) within the areas targeted by this assay, and inadequate number of viral copies(<138 copies/mL). A negative result must be combined with clinical observations, patient history, and epidemiological information. The expected result is Negative.  Fact Sheet for Patients:  EntrepreneurPulse.com.au  Fact Sheet for Healthcare Providers:   IncredibleEmployment.be  This test is no t yet approved or cleared by the [place] FDA and  has been authorized for detection and/or diagnosis of SARS-CoV-2 by FDA under an Emergency Use Authorization (EUA). This EUA will remain  in effect (meaning this test  can be used) for the duration of the COVID-19 declaration under Section 564(b)(1) of the Act, 21 U.S.C.section 360bbb-3(b)(1), unless the authorization is terminated  or revoked sooner.       Influenza A by PCR NEGATIVE NEGATIVE Final   Influenza B by PCR NEGATIVE NEGATIVE Final    Comment: (NOTE) The Xpert Xpress SARS-CoV-2/FLU/RSV plus assay is intended as an aid in the diagnosis of influenza from Nasopharyngeal swab specimens and should not be used as a sole basis for treatment. Nasal washings and aspirates are unacceptable for Xpert Xpress SARS-CoV-2/FLU/RSV testing.  Fact Sheet for Patients: EntrepreneurPulse.com.au  Fact Sheet for Healthcare Providers: IncredibleEmployment.be  This test is not yet approved or cleared by the [place] FDA and has been authorized for detection and/or diagnosis of SARS-CoV-2 by FDA under an Emergency Use Authorization (EUA). This EUA will remain in effect (meaning this test can be used) for the duration of the COVID-19 declaration under Section 564(b)(1) of the Act, 21 U.S.C. section 360bbb-3(b)(1), unless the authorization is terminated or revoked.  Performed at [facility], [place] [address], [place], [place] [address]      Discharge Instructions:   Discharge Instructions    Diet - low sodium heart healthy   Complete by: As directed    Diet Carb Modified   Complete by: As directed    Increase activity slowly   Complete by: As directed      Allergies as of 10/02/2020      Reactions   Cocoa Hives   Sulfamethoxazole-trimethoprim Anaphylaxis   Chocolate Hives   Citrus Hives   Reaction to oranges and  orange juice   Fish Allergy Hives, Swelling   Throat swelling   Shellfish Allergy Hives, Swelling   Throat swelling   Sulfa Antibiotics Hives, Swelling   Throat swelling   Tetracycline Swelling      Medication List    TAKE these medications   albuterol 108 (90 Base) MCG/ACT inhaler Commonly known as: VENTOLIN HFA Inhale 2 puffs into the lungs every 4 (four) hours as needed for wheezing or shortness of breath.   amiodarone 200 MG tablet Commonly known as: Pacerone Take 2 tablets (400 mg total) by mouth 2 (two) times daily.   apixaban 5 MG Tabs tablet Commonly known as: ELIQUIS Take 1 tablet (5 mg total) by mouth 2 (two) times daily.   Cholecalciferol 25 MCG (1000 UT) tablet Take 1,000 Units by mouth at bedtime.   diltiazem 240 MG 24 hr capsule Commonly known as: CARDIZEM CD Take 240 mg by mouth daily.   Fifty50 Glucose Meter 2.0 w/Device Kit See admin instructions.   gabapentin 300 MG capsule Commonly known as: NEURONTIN Take 600 mg by mouth 2 (two) times daily.   HYDROcodone-acetaminophen 10-325 MG tablet Commonly known as: NORCO Take 1 tablet by mouth every 6 (six) hours as needed for moderate pain.   latanoprost 0.005 % ophthalmic solution Commonly known as: XALATAN Place 1 drop into both eyes at bedtime.   metFORMIN 1000 MG tablet Commonly known as: GLUCOPHAGE Take 1,000 mg by mouth 2 (two) times daily.   metoprolol tartrate 25 MG tablet Commonly known as: LOPRESSOR Take 1 tablet (25 mg total) by mouth 2 (two) times daily.   multivitamin with minerals Tabs tablet Take 1 tablet by mouth at bedtime.   pantoprazole 40 MG tablet Commonly known as: PROTONIX Take 40 mg by mouth 2 (two) times daily.   PRESCRIPTION MEDICATION Inhale into the lungs at bedtime. CPAP  rosuvastatin 20 MG tablet Commonly known as: CRESTOR Take 20 mg by mouth daily.   Xtampza ER 9 MG C12a Generic drug: oxyCODONE ER Take 9 mg by mouth every 12 (twelve) hours.        Follow-up Information    Lake Bells., MD. Schedule an appointment as soon as possible for a visit.   Specialty: Gastroenterology Why: call to make appointment for follow up of anemia (low blood counts) and blood detected in stool Contact information: [address] [place] [address] [phone]        Eileen Stanford, [place]. Go on 10/14/2020.   Specialties: Cardiology, Radiology Why: @ 1:30pm, please arrive at least 15 minutes early.  Contact information: [address] [place]  [phone] [phone]                Time coordinating discharge: 31 minutes  Signed:  Jennye Boroughs  [facility] 10/02/2020, 10:06 AM   Pager on [email or web address]. If 7PM-7AM, please contact night-coverage at [email or web address]

## 2020-10-14 ENCOUNTER — Other Ambulatory Visit: Payer: Self-pay

## 2020-10-14 ENCOUNTER — Ambulatory Visit: Payer: Medicare HMO | Admitting: Physician Assistant

## 2020-10-14 ENCOUNTER — Encounter: Payer: Self-pay | Admitting: Physician Assistant

## 2020-10-14 VITALS — BP 178/80 | HR 98 | Ht 71.0 in | Wt 320.6 lb

## 2020-10-14 DIAGNOSIS — N179 Acute kidney failure, unspecified: Secondary | ICD-10-CM | POA: Diagnosis not present

## 2020-10-14 DIAGNOSIS — I959 Hypotension, unspecified: Secondary | ICD-10-CM | POA: Diagnosis not present

## 2020-10-14 DIAGNOSIS — K769 Liver disease, unspecified: Secondary | ICD-10-CM

## 2020-10-14 DIAGNOSIS — Z952 Presence of prosthetic heart valve: Secondary | ICD-10-CM

## 2020-10-14 DIAGNOSIS — D649 Anemia, unspecified: Secondary | ICD-10-CM

## 2020-10-14 DIAGNOSIS — I4892 Unspecified atrial flutter: Secondary | ICD-10-CM

## 2020-10-14 MED ORDER — METOPROLOL TARTRATE 50 MG PO TABS: 50.0000 mg | ORAL_TABLET | Freq: Two times a day (BID) | ORAL | 3 refills | Status: AC

## 2020-10-14 MED ORDER — AMIODARONE HCL 200 MG PO TABS: 200.0000 mg | ORAL_TABLET | Freq: Every day | ORAL | 3 refills | Status: DC

## 2020-10-14 NOTE — Progress Notes (Addendum)
[facility] [place]                                       Cardiology Office Note    Date:  10/14/2020   ID:  Lawrence Rose, DOB July 11, 1956, MRN [id number]  PCP:  [name], [name], MD  Cardiologist: Werner Lean, MD / Dr. Buena Irish & Dr. Cyndia Bent (TAVR)  CC: 1 month s/p TAVR  History of Present Illness:  Lawrence Rose is a 64 y.o. male with a history of HTN, HLD, anemia,morbid obesity,chronic back and knee pain with limited mobility,OSAon CPAP, CKD stage III, pulmonary HTN, sick euthyroid syndrome, mild MS, atrial flutter on eliquis, recent GI bleed and severe aortic stenosis s/p TAVR (09/01/20) who presents to clinic for follow up.   The patient was recently admitted for acute heart failure requiring IV diuresis.Transthoracic echocardiogram showed EF 60 to 65%, moderate concentric LVH, indeterminate diastolic function, severe left atrial dilation, mild mitral stenosis (mean gradient of 6 mm Hg at HR of 101bpm), severe aortic valve stenosis with anAVA0.9 cm, mean gradient 51 mmHg, peak gradient 82 mmHg, V-max 4.65 cm, DVI 0.2. He underwent left and right heart catheterization on 08/18/2020 which showed widely patent coronary arteries, high cardiac output at 9.06 L/min by Fick and 9.23 L/min by thermal dilution,severe pulmonary hypertension with a mean pressure of 48 mmHg, pulmonary capillary wedge pressure mean 34 mmHg, pulmonary vascular resistance 1.55 Woods units, mean right atrial pressure 19 mmHg, aortic valve peak gradient of 33 mmHg, mean gradient 22 mmHg, aortic valve area 1.94 cm by Fick and 1.98 cm by thermal dilution, LVEDP 20 mmHg. Further diuresis was recommended. There was some question of hishigh cardiac output being related to possible thyrotoxicosis as he was noted to have a suppressed TSH with a normal T4.He underwent full TAVR work-up during his admission and felt to be a good TAVR candidate at that time.  However, it was recommended that this be delayed given acute kidney injury secondary to diuresis and contrast dye studies  He was evaluated by the multidisciplinary valve team and underwent successful TAVR with a22m Medtronic Evolut Pro+THV via the TF approach on11/16/21. Post operative echoshowed EF >75%, normally functioning TAVR with a mean gradient of 17 mmHg and no PVL. He had mild AKI with creat 0.9--> 1.34. He was discharged on aspirin and plavix.   In follow up the patient was noted to be tachycardic, hypotensive with an abnormal ECG, worrisome for ST elevation. Stat limited echo was normal. His lasix and lisinopril was discontinued in the setting of hypotension and AKI. He was later diagnosed with atrial flutter with RVR and started with eliquis and amiodarone (DAPT stopped). Started on a loading dose of amio given low BPs and inability to titrate AV nodal blocking agents. He also developed acute kidney injury and acute blood loss anemia with a hg down to 7.4.  FOBT+. He was ultimately admitted 121/4-12/17/21 for transfusion and GI work up. EGD on 10/01/2020 showed erosive esophagitis. He was started on a PPI. H&H remained stable posttransfusion.  He also had AKI with creat up to 1.4 which was treated with IV fluids. Echo during admission showed EF 55%, normally functioning TAVR with a mean gradient of 7 mmHg and no PVL.  Today he presents to clinic for follow up. Here with his brother. Aggravated he has to see more doctors and  get more tests. Overall no complaints. No CP or SOB. No LE edema, orthopnea or PND. Says he has gained weight from overeating over the holidays. No dizziness or syncope. No blood in stool or urine. No palpitations. Mostly concerned about when he can get his knees done.   Past Medical History:  Diagnosis Date  . Acid reflux   . Allergic arthritis, hand    per patient, both hands  . Anemia   . Apnea   . Asthma   . Carpal tunnel syndrome    per patient, both  hands  . Chronic back pain   . Chronic kidney disease   . Chronic knee pain   . Diabetes mellitus without complication ([place])   . Dysrhythmia   . GI bleeding 09/2020  . Hypertension   . 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 Angelena Form and Dr. Cyndia Bent.   . Severe aortic stenosis   . Sleep apnea    wears CPAP every night    Past Surgical History:  Procedure Laterality Date  . BACK SURGERY    . ESOPHAGOGASTRODUODENOSCOPY (EGD) WITH PROPOFOL N/A 10/01/2020   Procedure: ESOPHAGOGASTRODUODENOSCOPY (EGD) WITH PROPOFOL;  Surgeon: [name], MD;  Location: [facility];  Service: Endoscopy;  Laterality: N/A;  . RIGHT/LEFT HEART CATH AND CORONARY ANGIOGRAPHY N/A 08/18/2020   Procedure: RIGHT/LEFT HEART CATH AND CORONARY ANGIOGRAPHY;  Surgeon: [name], MD;  Location: [facility];  Service: Cardiovascular;  Laterality: N/A;  . TEE WITHOUT CARDIOVERSION N/A 08/21/2020   Procedure: TRANSESOPHAGEAL ECHOCARDIOGRAM (TEE);  Surgeon: [name], MD;  Location: [facility] ENDOSCOPY;  Service: Cardiovascular;  Laterality: N/A;  . TEE WITHOUT CARDIOVERSION N/A 09/01/2020   Procedure: TRANSESOPHAGEAL ECHOCARDIOGRAM (TEE);  Surgeon: [name], MD;  Location: [place] [facility];  Service: Open Heart Surgery;  Laterality: N/A;  . TRANSCATHETER AORTIC VALVE REPLACEMENT, TRANSFEMORAL N/A 09/01/2020   Procedure: TRANSCATHETER AORTIC VALVE REPLACEMENT, TRANSFEMORAL;  Surgeon: [name], MD;  Location: [place] [facility];  Service: Open Heart Surgery;  Laterality: N/A;    Current Medications: Outpatient Medications Prior to Visit  Medication Sig Dispense Refill  . albuterol (VENTOLIN HFA) 108 (90 Base) MCG/ACT inhaler Inhale 2 puffs into the lungs every 4 (four) hours as needed for wheezing or shortness of breath.     Marland Kitchen apixaban (ELIQUIS) 5 MG TABS tablet Take 1 tablet (5 mg  total) by mouth 2 (two) times daily. 180 tablet 3  . Blood Glucose Monitoring Suppl (FIFTY50 GLUCOSE METER 2.0) w/Device KIT See admin instructions.    . Cholecalciferol 25 MCG (1000 UT) tablet Take 1,000 Units by mouth at bedtime.     Marland Kitchen diltiazem (CARDIZEM CD) 240 MG 24 hr capsule Take 240 mg by mouth daily.    Marland Kitchen gabapentin (NEURONTIN) 300 MG capsule Take 600 mg by mouth 2 (two) times daily.     Marland Kitchen HYDROcodone-acetaminophen (NORCO) 10-325 MG tablet Take 1 tablet by mouth every 6 (six) hours as needed for moderate pain.     Marland Kitchen latanoprost (XALATAN) 0.005 % ophthalmic solution Place 1 drop into both eyes at bedtime.     . metFORMIN (GLUCOPHAGE) 1000 MG tablet Take 1,000 mg by mouth 2 (two) times daily.    . Multiple Vitamin (MULTIVITAMIN WITH MINERALS) TABS tablet Take 1 tablet by mouth at bedtime.     Marland Kitchen oxyCODONE ER (XTAMPZA ER) 9 MG C12A Take  9 mg by mouth every 12 (twelve) hours.    . pantoprazole (PROTONIX) 40 MG tablet Take 40 mg by mouth 2 (two) times daily.    Marland Kitchen PRESCRIPTION MEDICATION Inhale into the lungs at bedtime. CPAP    . rosuvastatin (CRESTOR) 20 MG tablet Take 20 mg by mouth daily.     Marland Kitchen amiodarone (PACERONE) 200 MG tablet Take 2 tablets (400 mg total) by mouth 2 (two) times daily. 120 tablet 0  . metoprolol tartrate (LOPRESSOR) 25 MG tablet Take 1 tablet (25 mg total) by mouth 2 (two) times daily. 60 tablet 3   No facility-administered medications prior to visit.     Allergies:   Cocoa, Sulfamethoxazole-trimethoprim, Chocolate, Citrus, Fish allergy, Shellfish allergy, Sulfa antibiotics, and Tetracycline   Social History   Socioeconomic History  . Marital status: Single    Spouse name: Not on file  . Number of children: Not on file  . Years of education: Not on file  . Highest education level: Not on file  Occupational History  . Not on file  Tobacco Use  . Smoking status: Former Research scientist (life sciences)  . Smokeless tobacco: Never Used  . Tobacco comment: per patient quit over 30 years  ago  Vaping Use  . Vaping Use: Never used  Substance and Sexual Activity  . Alcohol use: No  . Drug use: Not Currently  . Sexual activity: Not on file  Other Topics Concern  . Not on file  Social History Narrative  . Not on file   Social Determinants of Health   Financial Resource Strain: Not on file  Food Insecurity: Not on file  Transportation Needs: Not on file  Physical Activity: Not on file  Stress: Not on file  Social Connections: Not on file     Family History:  The patient'sfamily history is not on file.     ROS:   Please see the history of present illness.    ROS All other systems reviewed and are negative.   PHYSICAL EXAM:   VS:  BP (!) 178/80   Pulse 98   Ht _0  (1.803 m)   Wt (!) 320 lb 9.6 oz (145.4 kg)   SpO2 93%   BMI 44.71 kg/m    GEN: Well nourished, well developed, in no acute distress, morbidly obese HEENT: normal Neck: no JVD or masses Cardiac: RR tachy; no murmurs, rubs, or gallops,no edema  Respiratory:  clear to auscultation bilaterally, normal work of breathing GI: soft, nontender, nondistended, + BS MS: no deformity or atrophy Skin: warm and dry, no rash    Neuro:  Alert and Oriented x 3, Strength and sensation are intact Psych: euthymic mood, full affect   Wt Readings from Last 3 Encounters:  10/14/20 (!) 320 lb 9.6 oz (145.4 kg)  10/01/20 (!) 319 lb 10.7 oz (145 kg)  09/24/20 (!) 315 lb (142.9 kg)      Studies/Labs Reviewed:   EKG:  EKG is ordered today.  The ekg ordered today demonstrates atrial flutter with saw tooth pattern, HR 136   Recent Labs: 08/16/2020: TSH 0.176 08/27/2020: ALT 18; B Natriuretic Peptide 48.3 09/02/2020: Magnesium 2.0 10/02/2020: BUN 16; Creatinine, Ser 1.36; Hemoglobin 9.0; Platelets 308; Potassium 4.1; Sodium 136   Lipid Panel    Component Value Date/Time   CHOL 127 08/17/2020 0351   TRIG 69 08/17/2020 0351   HDL 39 (L) 08/17/2020 0351   CHOLHDL 3.3 08/17/2020 0351   VLDL 14 08/17/2020  0351   LDLCALC 74 08/17/2020  0351    Additional studies/ records that were reviewed today include:  TAVR OPERATIVE NOTE  Shulem Mader [id number]  Date of Procedure:09/01/2020  Preoperative Diagnosis:Severe Aortic Stenosis   Postoperative Diagnosis:Same   Procedure:   Transcatheter Aortic Valve Replacement - Percutaneous RightTransfemoral Approach Medtronic Evolut-Pro+(size78m, serial # [id number]  Co-Surgeons:Bryan KAlveria Apley MD andChristopher MAngelena Form MD   Anesthesiologist:Robert FOla Spurr MD  EDala Dock MD  Pre-operative Echo Findings: ? Severe aortic stenosis and moderate AI ? Normalleft ventricular systolic function  Post-operative Echo Findings: ? Noparavalvular leak ? Normalleft ventricular systolic function  _____________   Echo 09/02/20: IMPRESSIONS  1. Left ventricular ejection fraction, by estimation, is >75%. The left  ventricle has hyperdynamic function. The left ventricle has no regional  wall motion abnormalities.  2. Right ventricular systolic function is normal. The right ventricular  size is normal.  3. The mitral valve is degenerative. Trivial mitral valve regurgitation.  No evidence of mitral stenosis. Severe mitral annular calcification.  4. Post TAVR using 29 mm Evolut Pro Medtronic supra annlular valve.  Imaging is sub otpimal very trivial PVL not localized by SA images.  Gradients on non imaging pedoff SS notch images seem high. I measure peak  velocity 1.5 m/sec mean gradient 5 mmHg  peak 9 mmHg. This is in setting of very hyperdynamic LV fucnton with LVOT  velocity 1.3 m/sec. AVA 2.1 cm2 and DVI 0.5 . The aortic valve has been  repaired/replaced. Aortic valve regurgitation is trivial. No aortic  stenosis is present.  5. The inferior vena cava is normal in size with greater than 50%   respiratory variability, suggesting right atrial pressure of 3 mmHg.    ______________________  Stat limited echo 09/16/20 IMPRESSIONS  1. Left ventricular ejection fraction, by estimation, is 55 to 60%. The  left ventricle has normal function. The left ventricle has no regional  wall motion abnormalities. There is mild concentric left ventricular  hypertrophy. Indeterminate diastolic  filling due to E-A fusion.  2. Right ventricular systolic function is normal. The right ventricular  size is normal. Tricuspid regurgitation signal is inadequate for assessing  PA pressure.  3. The mitral valve is degenerative. No evidence of mitral valve  regurgitation. No evidence of mitral stenosis. The mean mitral valve  gradient is 3.0 mmHg with average heart rate of 130 bpm.  4. The aortic valve has been repaired/replaced. Aortic valve  regurgitation is not visualized. There is a CoreValve-Evolut Pro  prosthetic (TAVR) valve present in the aortic position. Procedure Date:  09/01/2020. Echo findings are consistent with normal  structure and function of the aortic valve prosthesis.  5. The inferior vena cava is normal in size with greater than 50%  respiratory variability, suggesting right atrial pressure of 3 mmHg.   Comparison(s): Prior images reviewed side by side. The left ventricular systolic function is substantially lower compared to 2 weeks earlier, but remains in normal range.   _____________________  Echo 09/30/20 IMPRESSIONS 1. Left ventricular ejection fraction, by estimation, is 55 to 60%. The  left ventricle has normal function. The left ventricle has no regional  wall motion abnormalities. Left ventricular diastolic parameters are  indeterminate.  2. Right ventricular systolic function is normal. The right ventricular  size is normal. There is normal pulmonary artery systolic pressure. The  estimated right ventricular systolic pressure is 316.3mmHg.  3. The mitral  valve is normal in structure. Trivial mitral valve  regurgitation. No evidence of mitral stenosis.  4. The aortic valve has been repaired/replaced. Perivalvular  Aortic valve  regurgitation is not visualized. No aortic stenosis is present. There is a  29 mm CoreValve-Evolut Pro prosthetic (TAVR) valve present in the aortic  position. Procedure Date:  09/01/2020. Echo findings are consistent with normal structure and  function of the aortic valve prosthesis. Aortic valve area, by VTI  measures 2.40 cm. Aortic valve mean gradient measures 7.0 mmHg. Aortic  valve Vmax measures 1.88 m/s. Diminsionless  index 0.49.  5. The inferior vena cava is dilated in size with <50% respiratory  variability, suggesting right atrial pressure of 15 mmHg.    ASSESSMENT & PLAN:   Severe AS s/p TAVR: echo 09/30/20 showed EF 55%, normally functioning TAVR with a mean gradient of 7 mmHg and no PVL. The patient is doing well with NYHA class II symptoms. He will continue on Eliquis alone. I will see him back in 1 year with echo.  Hypotension: likely related to hypovolemia in the setting of acute GI bleed. Now resolved. BP elevated. 152/90 on my personal recheck with the correct sized cuff. Plan to increase Lopressor 25 mg twice daily to 50 mg twice daily. He will need close follow-up of blood pressure.  AKI: Creatinine improved to 1.36 at hospital discharge. Recheck a BMET today.  Acute blood loss anemia: Recently admitted and transfused. He underwent extensive GI work-up and hemoglobin has remained stable. We'll recheck a CBC today.  Atrial flutter: this was a new finding as post hospital follow up. His HR was initially very elevated and BP was soft limiting titration of AV nodal blocking agents. He has been Lopressor 40m BID and Cardizem CD 2464mdaily as well as amiodarone 400 mg BID (was loaded with plans to taper down but pt subsequently got admitted and it was never decreased). Ecg today shows atrial flutter  with variable block, HR 98. Now that BP is better (actually elevated today), will increase his Lopressor from 25 to 50 mg twice daily and decrease amiodarone to 200 mg daily. The patient is largely asymptomatic from his atrial flutter and is not interested in cardioversion. If a rate control strategy is pursued, amiodarone may be able to be discontinued in the future. Given new onset atrial flutter and recent suppressed TSH, I will repeat a thyroid study today.   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 Correlation with nonemergent abdominal MRI with and without IV gadolinium is recommended in the near future to definitively characterize this lesion and exclude neoplasm. Will get this set up today.   Knee osteoarthritis: pt anxious to get knee surgery. I think he can clear him for surgery once BP and HR under good control and blood counts remain stable.   Total time spent with patient was over 40 minutes which included evaluating patient, reviewing record and coordinating care. Face to face time >50%.   Medication Adjustments/Labs and Tests Ordered: Current medicines are reviewed at length with the patient today.  Concerns regarding medicines are outlined above.  Medication changes, Labs and Tests ordered today are listed in the Patient Instructions below. Patient Instructions  Medication Instructions:  Your physician has recommended you make the following change in your medication:  1-Decrease Amiodarone 200 mg by mouth daily 2-Increase Metoprolol 50 mg by mouth twice daily 3-START Eliquis 5 mg by mouth twice daily 4-STOP Aspirin   *If you need a refill on your cardiac medications before your next appointment, please call your pharmacy*  Lab Work: Your physician recommends  that you have lab work today- BMET, TSH and CBC  If you have labs (blood work) drawn today and your tests are completely normal, you will receive your results  only by: Marland Kitchen MyChart Message (if you have MyChart) OR . A paper copy in the mail If you have any lab test that is abnormal or we need to change your treatment, we will call you to review the results.  Testing/Procedures: Your physician has requested that you have a MRI of the liver.  MRI uses a computer to create images of your liver .   Follow-Up: At [facility], you and your health needs are our priority.  As part of our continuing mission to provide you with exceptional heart care, we have created designated Provider Care Teams.  These Care Teams include your primary Cardiologist (physician) and Advanced Practice Providers (APPs -  Physician Assistants and Nurse Practitioners) who all work together to provide you with the care you need, when you need it.  We recommend signing up for the patient portal called "MyChart".  Sign up information is provided on this After Visit Summary.  MyChart is used to connect with patients for Virtual Visits (Telemedicine).  Patients are able to view lab/test results, encounter notes, upcoming appointments, etc.  Non-urgent messages can be sent to your provider as well.   To learn more about what you can do with MyChart, go to [email or web address].    Your next appointment:   2 weeks  The format for your next appointment:   In Person  Provider:   You may see Werner Lean, MD or one of the following Advanced Practice Providers on your designated Care Team:    Melina Copa, PA-C  Ermalinda Barrios, PA-C      Signed, Angelena Form, PA-C  10/14/2020 8:10 PM    [facility] [address], [place], [place]  [address] Phone: [phone]; Fax: [phone]

## 2020-10-14 NOTE — Patient Instructions (Addendum)
Medication Instructions:  Your physician has recommended you make the following change in your medication:  1-Decrease Amiodarone 200 mg by mouth daily 2-Increase Metoprolol 50 mg by mouth twice daily 3-START Eliquis 5 mg by mouth twice daily 4-STOP Aspirin   *If you need a refill on your cardiac medications before your next appointment, please call your pharmacy*  Lab Work: Your physician recommends that you have lab work today- BMET, TSH and CBC  If you have labs (blood work) drawn today and your tests are completely normal, you will receive your results only by: Marland Kitchen MyChart Message (if you have MyChart) OR . A paper copy in the mail If you have any lab test that is abnormal or we need to change your treatment, we will call you to review the results.  Testing/Procedures: Your physician has requested that you have a MRI of the liver.  MRI uses a computer to create images of your liver .   Follow-Up: At [facility], you and your health needs are our priority.  As part of our continuing mission to provide you with exceptional heart care, we have created designated Provider Care Teams.  These Care Teams include your primary Cardiologist (physician) and Advanced Practice Providers (APPs -  Physician Assistants and Nurse Practitioners) who all work together to provide you with the care you need, when you need it.  We recommend signing up for the patient portal called "MyChart".  Sign up information is provided on this After Visit Summary.  MyChart is used to connect with patients for Virtual Visits (Telemedicine).  Patients are able to view lab/test results, encounter notes, upcoming appointments, etc.  Non-urgent messages can be sent to your provider as well.   To learn more about what you can do with MyChart, go to [email or web address].    Your next appointment:   2 weeks  The format for your next appointment:   In Person  Provider:   You may see Christell Constant, MD or one  of the following Advanced Practice Providers on your designated Care Team:    Ronie Spies, PA-C  Jacolyn Reedy, PA-C

## 2020-10-15 LAB — CBC WITH DIFFERENTIAL/PLATELET
Basophils Absolute: 0.1 10*3/uL (ref 0.0–0.2)
Basos: 1 %
EOS (ABSOLUTE): 0.1 10*3/uL (ref 0.0–0.4)
Eos: 1 %
Hematocrit: 30.5 % — ABNORMAL LOW (ref 37.5–51.0)
Hemoglobin: 9.2 g/dL — ABNORMAL LOW (ref 13.0–17.7)
Immature Grans (Abs): 0 10*3/uL (ref 0.0–0.1)
Immature Granulocytes: 0 %
Lymphocytes Absolute: 1.2 10*3/uL (ref 0.7–3.1)
Lymphs: 15 %
MCH: 25.3 pg — ABNORMAL LOW (ref 26.6–33.0)
MCHC: 30.2 g/dL — ABNORMAL LOW (ref 31.5–35.7)
MCV: 84 fL (ref 79–97)
Monocytes Absolute: 0.6 10*3/uL (ref 0.1–0.9)
Monocytes: 8 %
Neutrophils Absolute: 5.9 10*3/uL (ref 1.4–7.0)
Neutrophils: 75 %
Platelets: 292 10*3/uL (ref 150–450)
RBC: 3.64 x10E6/uL — ABNORMAL LOW (ref 4.14–5.80)
RDW: 14.8 % (ref 11.6–15.4)
WBC: 7.8 10*3/uL (ref 3.4–10.8)

## 2020-10-15 LAB — BASIC METABOLIC PANEL
BUN/Creatinine Ratio: 15 (ref 10–24)
BUN: 23 mg/dL (ref 8–27)
CO2: 24 mmol/L (ref 20–29)
Calcium: 9.4 mg/dL (ref 8.6–10.2)
Chloride: 98 mmol/L (ref 96–106)
Creatinine, Ser: 1.5 mg/dL — ABNORMAL HIGH (ref 0.76–1.27)
GFR calc Af Amer: 55 mL/min/{1.73_m2} — ABNORMAL LOW (ref >59)
GFR calc non Af Amer: 48 mL/min/{1.73_m2} — ABNORMAL LOW (ref >59)
Glucose: 97 mg/dL (ref 65–99)
Potassium: 4.5 mmol/L (ref 3.5–5.2)
Sodium: 139 mmol/L (ref 134–144)

## 2020-10-15 LAB — TSH: TSH: 0.865 u[IU]/mL (ref 0.450–4.500)

## 2020-10-15 NOTE — Addendum Note (Signed)
Addended by: Janetta Hora on: 10/15/2020 02:37 AM   Modules accepted: Level of Service

## 2020-10-19 ENCOUNTER — Telehealth: Payer: Self-pay | Admitting: Internal Medicine

## 2020-10-19 NOTE — Telephone Encounter (Signed)
Pt called back in returning Pams call for lab results.

## 2020-10-20 NOTE — Telephone Encounter (Signed)
Reviewed results with patient who verbalized understanding. 

## 2020-10-20 NOTE — Telephone Encounter (Signed)
-----   Message from Janetta Hora, PA-C sent at 10/15/2020  2:38 AM EST ----- Blood counts improved, renal function remains elevated, TSH normal. No change in plans

## 2020-10-27 NOTE — Addendum Note (Signed)
Addended by: Kerrie Buffalo on: 10/27/2020 07:48 AM   Modules accepted: Orders

## 2020-10-27 NOTE — Addendum Note (Signed)
Addended by: Kerrie Buffalo on: 10/27/2020 07:50 AM   Modules accepted: Orders

## 2020-10-28 ENCOUNTER — Ambulatory Visit (HOSPITAL_COMMUNITY)
Admission: RE | Admit: 2020-10-28 | Discharge: 2020-10-28 | Disposition: A | Discharge status: Home / Self Care | Payer: Medicare HMO | Source: Ambulatory Visit | Attending: Physician Assistant | Admitting: Physician Assistant

## 2020-10-28 ENCOUNTER — Other Ambulatory Visit: Payer: Self-pay

## 2020-10-28 DIAGNOSIS — K769 Liver disease, unspecified: Secondary | ICD-10-CM | POA: Insufficient documentation

## 2020-10-28 MED ORDER — GADOBUTROL 1 MMOL/ML IV SOLN
10.0000 mL | Freq: Once | INTRAVENOUS | Status: AC | PRN
  Administered 2020-10-28: 10 mL via INTRAVENOUS

## 2020-10-29 ENCOUNTER — Ambulatory Visit: Payer: Medicare HMO | Admitting: Internal Medicine

## 2020-10-29 ENCOUNTER — Encounter: Payer: Self-pay | Admitting: Internal Medicine

## 2020-10-29 VITALS — BP 110/70 | HR 102 | Ht 71.0 in | Wt 308.0 lb

## 2020-10-29 DIAGNOSIS — I4892 Unspecified atrial flutter: Secondary | ICD-10-CM | POA: Diagnosis not present

## 2020-10-29 DIAGNOSIS — Z952 Presence of prosthetic heart valve: Secondary | ICD-10-CM

## 2020-10-29 NOTE — Progress Notes (Signed)
Cardiology Office Note:    Date:  10/29/2020   ID:  Lawrence Rose, DOB 11-01-1955, MRN [id number]  PCP:  [name], [name], MD  [facility] Cardiologist:  Werner Lean, MD  [facility] [place] [facility] Electrophysiologist:  None   CC: Post TAVR Follow Up  History of Present Illness:    Lawrence Rose is a 65 y.o. male with a hx of HTN, HLD, Morbid Obesity, OSA on CPAP, CKD Stage III, Atrial flutter on eliquis with recent GI bleed initially since inpatient with severe aortic stenosis s/p 09/01/20 TAVR. Post TAVR was diagnosed with AFl and put on eliquis and amiodarone.  This leg to GI bleed with 10/02/20 hospitalization.    Patient notes that he is doing well.  Noticed no bleeding on eliquis; even checked his stool. . Since last visit notes no new bleeding changes.  Relevant interval testing or therapy include restart of eliquis.  There are no interval hospital/ED visit since TAVR team eval.    No chest pain or pressure.  No SOB/DOE and no PND/Orthopnea.  No weight gain; has lost 12 lbs since 10/14/20.  Nor leg swelling.  No palpitations or syncope .   Past Medical History:  Diagnosis Date  . Acid reflux   . Allergic arthritis, hand    per patient, both hands  . Anemia   . Apnea   . Asthma   . Carpal tunnel syndrome    per patient, both hands  . Chronic back pain   . Chronic kidney disease   . Chronic knee pain   . Diabetes mellitus without complication ([place])   . Dysrhythmia   . GI bleeding 09/2020  . Hypertension   . 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 Angelena Form and Dr. Cyndia Bent.   . Severe aortic stenosis   . Sleep apnea    wears CPAP every night    Past Surgical History:  Procedure Laterality Date  . BACK SURGERY    . ESOPHAGOGASTRODUODENOSCOPY (EGD) WITH PROPOFOL N/A 10/01/2020   Procedure: ESOPHAGOGASTRODUODENOSCOPY (EGD) WITH PROPOFOL;  Surgeon: [name],  MD;  Location: [place] [facility];  Service: Endoscopy;  Laterality: N/A;  . RIGHT/LEFT HEART CATH AND CORONARY ANGIOGRAPHY N/A 08/18/2020   Procedure: RIGHT/LEFT HEART CATH AND CORONARY ANGIOGRAPHY;  Surgeon: [name], MD;  Location: [place] [facility];  Service: Cardiovascular;  Laterality: N/A;  . TEE WITHOUT CARDIOVERSION N/A 08/21/2020   Procedure: TRANSESOPHAGEAL ECHOCARDIOGRAM (TEE);  Surgeon: [name], MD;  Location: [facility];  Service: Cardiovascular;  Laterality: N/A;  . TEE WITHOUT CARDIOVERSION N/A 09/01/2020   Procedure: TRANSESOPHAGEAL ECHOCARDIOGRAM (TEE);  Surgeon: [name], MD;  Location: [place] [facility];  Service: Open Heart Surgery;  Laterality: N/A;  . TRANSCATHETER AORTIC VALVE REPLACEMENT, TRANSFEMORAL N/A 09/01/2020   Procedure: TRANSCATHETER AORTIC VALVE REPLACEMENT, TRANSFEMORAL;  Surgeon: [name], MD;  Location: [place] [facility];  Service: Open Heart Surgery;  Laterality: N/A;    Current Medications: Current Meds  Medication Sig  . albuterol (VENTOLIN HFA) 108 (90 Base) MCG/ACT inhaler Inhale 2 puffs into the lungs every 4 (four) hours as needed for wheezing or shortness of breath.   Marland Kitchen amiodarone (PACERONE) 200 MG tablet Take 1 tablet (200 mg total) by mouth daily.  Marland Kitchen apixaban (ELIQUIS) 5 MG TABS tablet Take 1 tablet (5 mg total) by mouth 2 (two) times daily.  . Blood Glucose Monitoring  Suppl (FIFTY50 GLUCOSE METER 2.0) w/Device KIT See admin instructions.  . Cholecalciferol 25 MCG (1000 UT) tablet Take 1,000 Units by mouth at bedtime.   Marland Kitchen diltiazem (CARDIZEM CD) 240 MG 24 hr capsule Take 240 mg by mouth daily.  Marland Kitchen gabapentin (NEURONTIN) 300 MG capsule Take 600 mg by mouth 2 (two) times daily.   Marland Kitchen HYDROcodone-acetaminophen (NORCO) 10-325 MG tablet Take 1 tablet by mouth every 6 (six) hours as needed for moderate pain.   Marland Kitchen latanoprost (XALATAN) 0.005 % ophthalmic solution Place 1 drop into both eyes at bedtime.   .  metFORMIN (GLUCOPHAGE) 1000 MG tablet Take 1,000 mg by mouth 2 (two) times daily.  . metoprolol tartrate (LOPRESSOR) 50 MG tablet Take 1 tablet (50 mg total) by mouth 2 (two) times daily.  . Multiple Vitamin (MULTI VITAMIN MENS PO) Take by mouth.  . Multiple Vitamin (MULTIVITAMIN WITH MINERALS) TABS tablet Take 1 tablet by mouth at bedtime.   Marland Kitchen oxyCODONE ER (XTAMPZA ER) 9 MG C12A Take 9 mg by mouth every 12 (twelve) hours.  . pantoprazole (PROTONIX) 40 MG tablet Take 40 mg by mouth 2 (two) times daily.  Marland Kitchen PRESCRIPTION MEDICATION Inhale into the lungs at bedtime. CPAP  . rosuvastatin (CRESTOR) 20 MG tablet Take 20 mg by mouth daily.      Allergies:   Cocoa, Sulfamethoxazole-trimethoprim, Chocolate, Citrus, Fish allergy, Shellfish allergy, Sulfa antibiotics, and Tetracycline   Social History   Socioeconomic History  . Marital status: Single    Spouse name: Not on file  . Number of children: Not on file  . Years of education: Not on file  . Highest education level: Not on file  Occupational History  . Not on file  Tobacco Use  . Smoking status: Former Research scientist (life sciences)  . Smokeless tobacco: Never Used  . Tobacco comment: per patient quit over 30 years ago  Vaping Use  . Vaping Use: Never used  Substance and Sexual Activity  . Alcohol use: No  . Drug use: Not Currently  . Sexual activity: Not on file  Other Topics Concern  . Not on file  Social History Narrative  . 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 History of heart failure notable for no members. History of arrhythmia notable for no members.  ROS:   Please see the history of present illness.    All other systems reviewed and are  negative.  EKGs/Labs/Other Studies Reviewed:    The following studies were reviewed today:   EKG:   10/29/20: Atrial Flutter rate 102 10/14/20  Atrial Flutter heart rate 98  Transthoracic Echocardiogram: Echo 09/30/20 IMPRESSIONS 1. Left ventricular ejection fraction, by estimation, is 55 to 60%. The  left ventricle has normal function. The left ventricle has no regional  wall motion abnormalities. Left ventricular diastolic parameters are  indeterminate.  2. Right ventricular systolic function is normal. The right ventricular  size is normal. There is normal pulmonary artery systolic pressure. The  estimated right ventricular systolic pressure is 76.1 mmHg.  3. The mitral valve is normal in structure. Trivial mitral valve  regurgitation. No evidence of mitral stenosis.  4. The aortic valve has been repaired/replaced. Perivalvular Aortic valve  regurgitation is not visualized. No aortic stenosis is present. There is a  29 mm CoreValve-Evolut Pro prosthetic (TAVR) valve present in the aortic  position. Procedure Date:  09/01/2020. Echo findings are consistent with normal structure and  function of the aortic valve prosthesis. Aortic valve area, by VTI  measures 2.40 cm. Aortic valve mean gradient measures 7.0 mmHg. Aortic  valve Vmax measures 1.88 m/s. Diminsionless  index 0.49.  5. The inferior vena cava is dilated in size with <50% respiratory  variability, suggesting right atrial pressure of 15 mmHg.   Transesophageal Echocardiogram: Date:08/21/2020 Results: SIZING IMPRESSIONS  1. Severe AS (mean gradient 42 mmHg and peak velocity of 4.2 m/s); mild  AI.  2. Left ventricular ejection fraction, by estimation, is 70 to 75%. The  left ventricle has hyperdynamic function. There is severe left ventricular  hypertrophy.  3. Right ventricular systolic function is normal. The right ventricular  size is normal.  4. Left atrial size was moderately dilated. No left  atrial/left atrial  appendage thrombus was detected.  5. The mitral valve is normal in structure. Trivial mitral valve  regurgitation.  6. The aortic valve is tricuspid. Aortic valve regurgitation is mild.  Severe aortic valve stenosis.  7. There is Moderate (Grade III) plaque involving the descending aorta.  Sizing Measurements for Watchman Flex: 45 degree: 17 mm, Depth of 27 mm 90 18 mm Depth of 21 mm 135 26 mm, Depth of 30 mm 0 degree not done  Cardiac CT: Date: 08/19/20 Results: FINDINGS: Aortic Valve: Calcium score 1645 Tri leaflet with restricted motion  Aorta: No aneurysm moderate calcific atherosclerosis normal arch vessels  Sino-tubular Junction: 25 mm  Ascending Thoracic Aorta: 33 mm  Aortic Arch: 25 mm  Descending Thoracic Aorta: 25 mm  Sinus of Valsalva Measurements:  Non-coronary: 30.8 mm  Right - coronary: 28.2 mm  Left - coronary: 31.3 mm  Coronary Artery Height above Annulus:  Left Main: 11.1 mm above annulus  Right Coronary: 15.3 mm above annulus  Virtual Basal Annulus Measurements:  Maximum/Minimum Diameter: 27.4 mm x 22.8 mm  Perimeter: 82 mm  Area: 505 mm2  Coronary Arteries: Sufficient height above annulus for deployment  Optimum Fluoroscopic Angle for Delivery: LAO 3 Caudal 6 degrees  IMPRESSION: 1. Tri-leaflet AV with calcium score 1645 and annular area of 505 mm2 suitable for a 26 mm Sapien 3 valve  2.  Coronary arteries sufficient height above annulus for deployment  3.  Optimum angiographic angle for deployment LAO 3 Caudal 6 degrees  4. Some nodular calcification in annulus at base of left coronary cusp  5.  Normal aortic root 3.3 cm  Left/Right Heart Catheterizations: Date: 08/18/20 Results:  Widely patent coronary arteries  High cardiac output: 9.06 L/min by Fick and 9.23 L/min by thermal dilution.  Etiology uncertain.  Suppressed TSH with normal T4 raising question of T3 thyrotoxicosis  versus other mechanism.  Severe pulmonary hypertension, mean pressure 48 mmHg, based upon hemodynamics WHO group 2  Pulmonary capillary wedge pressure mean 34 mmHg, pulmonary vascular resistance 1.55 Woods units.  Mean right atrial pressure 19 mmHg  Aortic valve gradient peak to peak 33 mmHg with mean gradient 22 mmHg.  Aortic valve area 1.94 cm by Fick cardiac output and 1.98 cm by thermodilution  LV function normal by echocardiography with moderate LVH  Recent Labs: 08/27/2020: ALT 18; B Natriuretic Peptide 48.3 09/02/2020: Magnesium 2.0 10/14/2020: BUN 23; Creatinine, Ser 1.50; Hemoglobin 9.2; Platelets 292; Potassium 4.5; Sodium 139; TSH 0.865  Recent  Lipid Panel    Component Value Date/Time   CHOL 127 08/17/2020 0351   TRIG 69 08/17/2020 0351   HDL 39 (L) 08/17/2020 0351   CHOLHDL 3.3 08/17/2020 0351   VLDL 14 08/17/2020 0351   LDLCALC 74 08/17/2020 0351     Risk Assessment/Calculations:     CHA2DS2-VASc Score = 2  This indicates a 2.2% annual risk of stroke. The patient's score is based upon: CHF History: No HTN History: Yes Diabetes History: No Stroke History: No Vascular Disease History: No Age Score: 1 Gender Score: 0      Physical Exam:    VS:  BP 110/70   Pulse (!) 102   Ht '5\' 11"'  (1.803 m)   Wt (!) 308 lb (139.7 kg)   SpO2 97%   BMI 42.96 kg/m     Wt Readings from Last 3 Encounters:  10/29/20 (!) 308 lb (139.7 kg)  10/14/20 (!) 320 lb 9.6 oz (145.4 kg)  10/01/20 (!) 319 lb 10.7 oz (145 kg)     GEN: Obese Male, well developed in no acute distress HEENT: Normal NECK: No JVD; No carotid bruits LYMPHATICS: No lymphadenopathy CARDIAC: Irregularly irregular with tachycardia, no murmurs, rubs, gallops RESPIRATORY:  Clear to auscultation without rales, wheezing or rhonchi  ABDOMEN: Soft, non-tender, non-distended MUSCULOSKELETAL:  No edema; No deformity  SKIN: Warm and dry NEUROLOGIC:  Alert and oriented x 3 PSYCHIATRIC:  Normal affect    ASSESSMENT:    1. S/P TAVR (transcatheter aortic valve replacement)   2. Atrial flutter, unspecified type ([place])   3. Morbid obesity ([place])    PLAN:    In order of problems listed above:  Severe Aortic Stenosis s/p TAVR - minimal residual gradient without evidence of significant paravalvular leak - antithrombotic plan: Eliquis - IE prophylasxis: augmentin 2 g PO PRN dental procedure  Atrial Flutter OSA on CPAP Recent discharge for anemia - CHADSVASC=2. - TSH low normal 12.29.21, imaging notable for left atrial dilation and maximal left atrial appendage diameter 26 mm:  - Sizing Measurements for Watchman Flex: 45 degree: 17 mm, Depth of 27 mm 90 18 mm Depth of 21 mm 135 26 mm, Depth of 30 mm 0 degree not done - Continue anticoagulation with eliquis.  - Continue rate control with metoprolol and diltiazem - will need CBC, CMP follow up; at next visit will get PFTs if still needing amiodarone - Rhythm control options:  Continue amiodarone 200 mg PO daily - Consideration for WATCHMAN FLX:  EP Evaluation - I have seen Lawrence Rose in the office today.  He was referred by [name], [name], MD for consideration for Left Atrial Appendage Closure with the Watchman Device for management of stroke risk. Based upon past history, it has been determined that he is a poor candidate for long-term oral anticoagulation.  However, he may be tolerant of short-term treatment with an anticoagulant as necessary.  A shared decision has been made utilizing the [employer] of Cardiology shared decision tool to undergo Left Atrial Appendage Closure with Watchman device at this time if appropriate per EP evaluation.  Essential Hypertension, Morbid Obesity - continue home medications - discussed diet (DASH/low sodium), and exercise/weight loss interventions   3-4 follow up unless new symptoms or abnormal test results warranting change in plan  Would be reasonable for  APP Follow  up   Medication Adjustments/Labs and Tests Ordered: Current medicines are reviewed at length with the patient today.  Concerns regarding medicines are outlined above.  Orders Placed This Encounter  Procedures  . Comp Met (CMET)  . CBC w/Diff  . EKG 12-Lead   No orders of the defined types were placed in this encounter.   Patient Instructions  Medication Instructions:  Your physician has recommended you make the following change in your medication: -- You will need to take Augmenten 2g prior to dental procedures -- When you have any dental procedures scheduled you will need to call our office and let us know so the prescription can be filled appropriately. *If you need a refill on your cardiac medications before your next appointment, please call your pharmacy*  Lab Work: Your physician has recommended that you have lab work today: CBC and CMET If you have labs (blood work) drawn today and your tests are completely normal, you will receive your results only by: Marland Kitchen MyChart Message (if you have MyChart) OR . A paper copy in the mail If you have any lab test that is abnormal or we need to change your treatment, we will call you to review the results.  Follow-Up: At [facility], you and your health needs are our priority.  As part of our continuing mission to provide you with exceptional heart care, we have created designated Provider Care Teams.  These Care Teams include your primary Cardiologist (physician) and Advanced Practice Providers (APPs -  Physician Assistants and Nurse Practitioners) who all work together to provide you with the care you need, when you need it.  We recommend signing up for the patient portal called "MyChart".  Sign up information is provided on this After Visit Summary.  MyChart is used to connect with patients for Virtual Visits (Telemedicine).  Patients are able to view lab/test results, encounter notes, upcoming appointments, etc.  Non-urgent messages can be  sent to your provider as well.   To learn more about what you can do with MyChart, go to [email or web address].    Your next appointment:   Your physician recommends that you schedule a follow-up appointment -- 1st available with Dr. Burt Knack or Dr. Quentin Ore for consultation of the Watchman procedure versus medication management.   Your physician recommends that you schedule a follow-up appointment in: 4 MONTHS with Dr [name].  The format for your next appointment:   In Person with Rudean Haskell, MD       Signed, Werner Lean, MD  10/29/2020 4:21 PM    [place]

## 2020-10-29 NOTE — Patient Instructions (Addendum)
Medication Instructions:  Your physician has recommended you make the following change in your medication: -- You will need to take Amoxicillin 2g prior to dental procedures -- When you have any dental procedures scheduled you will need to call our office and let us know so the prescription can be filled appropriately. *If you need a refill on your cardiac medications before your next appointment, please call your pharmacy*  Lab Work: Your physician has recommended that you have lab work today: CBC and CMET If you have labs (blood work) drawn today and your tests are completely normal, you will receive your results only by: Marland Kitchen MyChart Message (if you have MyChart) OR . A paper copy in the mail If you have any lab test that is abnormal or we need to change your treatment, we will call you to review the results.  Follow-Up: At [facility], you and your health needs are our priority.  As part of our continuing mission to provide you with exceptional heart care, we have created designated Provider Care Teams.  These Care Teams include your primary Cardiologist (physician) and Advanced Practice Providers (APPs -  Physician Assistants and Nurse Practitioners) who all work together to provide you with the care you need, when you need it.  We recommend signing up for the patient portal called "MyChart".  Sign up information is provided on this After Visit Summary.  MyChart is used to connect with patients for Virtual Visits (Telemedicine).  Patients are able to view lab/test results, encounter notes, upcoming appointments, etc.  Non-urgent messages can be sent to your provider as well.   To learn more about what you can do with MyChart, go to [email or web address].    Your next appointment:   Your physician recommends that you schedule a follow-up appointment -- 1st available with Dr. Excell Seltzer or Dr. Lalla Brothers for consultation of the Watchman procedure versus medication management.   Your physician  recommends that you schedule a follow-up appointment in: 4 MONTHS with Dr Izora Ribas.  The format for your next appointment:   In Person with Riley Lam, MD

## 2020-10-30 ENCOUNTER — Telehealth: Payer: Self-pay

## 2020-10-30 ENCOUNTER — Telehealth: Payer: Self-pay | Admitting: *Deleted

## 2020-10-30 LAB — COMPREHENSIVE METABOLIC PANEL
ALT: 13 IU/L (ref 0–44)
AST: 14 IU/L (ref 0–40)
Albumin/Globulin Ratio: 1.1 — ABNORMAL LOW (ref 1.2–2.2)
Albumin: 3.9 g/dL (ref 3.8–4.8)
Alkaline Phosphatase: 75 IU/L (ref 44–121)
BUN/Creatinine Ratio: 13 (ref 10–24)
BUN: 20 mg/dL (ref 8–27)
Bilirubin Total: 0.3 mg/dL (ref 0.0–1.2)
CO2: 28 mmol/L (ref 20–29)
Calcium: 9.5 mg/dL (ref 8.6–10.2)
Chloride: 98 mmol/L (ref 96–106)
Creatinine, Ser: 1.57 mg/dL — ABNORMAL HIGH (ref 0.76–1.27)
GFR calc Af Amer: 52 mL/min/{1.73_m2} — ABNORMAL LOW (ref >59)
GFR calc non Af Amer: 45 mL/min/{1.73_m2} — ABNORMAL LOW (ref >59)
Globulin, Total: 3.4 g/dL (ref 1.5–4.5)
Glucose: 108 mg/dL — ABNORMAL HIGH (ref 65–99)
Potassium: 4.7 mmol/L (ref 3.5–5.2)
Sodium: 141 mmol/L (ref 134–144)
Total Protein: 7.3 g/dL (ref 6.0–8.5)

## 2020-10-30 LAB — CBC WITH DIFFERENTIAL/PLATELET
Basophils Absolute: 0.1 10*3/uL (ref 0.0–0.2)
Basos: 1 %
EOS (ABSOLUTE): 0.2 10*3/uL (ref 0.0–0.4)
Eos: 2 %
Hematocrit: 32.4 % — ABNORMAL LOW (ref 37.5–51.0)
Hemoglobin: 10 g/dL — ABNORMAL LOW (ref 13.0–17.7)
Immature Grans (Abs): 0 10*3/uL (ref 0.0–0.1)
Immature Granulocytes: 0 %
Lymphocytes Absolute: 1.3 10*3/uL (ref 0.7–3.1)
Lymphs: 17 %
MCH: 24.6 pg — ABNORMAL LOW (ref 26.6–33.0)
MCHC: 30.9 g/dL — ABNORMAL LOW (ref 31.5–35.7)
MCV: 80 fL (ref 79–97)
Monocytes Absolute: 1 10*3/uL — ABNORMAL HIGH (ref 0.1–0.9)
Monocytes: 13 %
Neutrophils Absolute: 5.1 10*3/uL (ref 1.4–7.0)
Neutrophils: 67 %
Platelets: 283 10*3/uL (ref 150–450)
RBC: 4.07 x10E6/uL — ABNORMAL LOW (ref 4.14–5.80)
RDW: 15.2 % (ref 11.6–15.4)
WBC: 7.7 10*3/uL (ref 3.4–10.8)

## 2020-10-30 MED ORDER — AMOXICILLIN 500 MG PO CAPS: ORAL_CAPSULE | ORAL | Status: DC

## 2020-10-30 NOTE — Telephone Encounter (Signed)
The patient has been notified of the result and verbalized understanding.  All questions (if any) were answered. Leanord Hawking, RN 10/30/2020 9:25 AM

## 2020-10-30 NOTE — Telephone Encounter (Signed)
The patient has been notified of the result and verbalized understanding.  All questions (if any) were answered. Loa Socks, LPN 2/67/1245 8:09 PM

## 2020-10-30 NOTE — Telephone Encounter (Signed)
-----   Message from Janetta Hora, [place] sent at 10/29/2020 11:27 AM EST ----- Can you please let the pt know that the images are indeterminate but lesion on liver favored to be a hemangioma. 3 month follow up MRI can be considered. This will be deferred to his PCP, Dr. Sharion Dove.

## 2020-10-30 NOTE — Telephone Encounter (Signed)
Left the patient a second message to call the office regarding results.

## 2020-10-30 NOTE — Telephone Encounter (Signed)
-----   Message from Christell Constant, MD sent at 10/30/2020  2:06 PM EST ----- Regarding: Labs Stable LFTs, Hgb and kidney function.  I exited the quick actions in error, would appreciate if we can get the news to the patient.  Thanks so much.  Mahesh

## 2020-10-30 NOTE — Telephone Encounter (Signed)
-----   Message from Kathryn R Thompson, PA-C sent at 10/29/2020 11:27 AM EST ----- Can you please let the pt know that the images are indeterminate but lesion on liver favored to be a hemangioma. 3 month follow up MRI can be considered. This will be deferred to his PCP, Dr. Zapata.   

## 2020-10-30 NOTE — Telephone Encounter (Signed)
Patient is returning call.  °

## 2020-10-30 NOTE — Addendum Note (Signed)
Addended by: Dareen Piano on: 10/30/2020 08:37 AM   Modules accepted: Orders

## 2020-11-03 ENCOUNTER — Telehealth: Payer: Self-pay

## 2020-11-03 NOTE — Telephone Encounter (Signed)
Called to arrange Watchman consult with Dr. Excell Seltzer per Dr. Izora Ribas.  Left message to call back.

## 2020-11-03 NOTE — Telephone Encounter (Signed)
Left message to call back  

## 2020-11-03 NOTE — Telephone Encounter (Signed)
Scheduled the patient for Watchman consult with Dr. Excell Seltzer 11/05/20. The patient was grateful for call and agrees with plan.

## 2020-11-04 NOTE — Telephone Encounter (Signed)
Patient is calling to reschedule Watchman consult, currently scheduled for 11/05/20 with Dr. Excell Seltzer. He states he will not have transportation at that time and he is requesting a call back to reschedule.

## 2020-11-04 NOTE — Telephone Encounter (Signed)
Rescheduled the patient for Watchman consult to 11/25/2020. He was grateful for call and agrees with plan.

## 2020-11-05 ENCOUNTER — Institutional Professional Consult (permissible substitution): Payer: Medicare HMO | Admitting: Cardiovascular Disease

## 2020-11-10 ENCOUNTER — Other Ambulatory Visit: Payer: Self-pay | Admitting: Physician Assistant

## 2020-11-10 NOTE — Telephone Encounter (Addendum)
Eliquis 5mg  refill request received. Patient is 65 years old, weight-139.7kg, Crea-1.57 on 10/29/2020, Diagnosis-Aflutter, and last seen by Dr. 10/31/2020 on 10/29/20 and pending appt with Dr. 10/31/20 on 11/25/2020. Dose is appropriate based on dosing criteria.   Refill was sent on 09/24/2020 3 month supply with 3 refills, which equals a year supply. Need to call & confirm if they have this.   Called and spoke with 14/06/2020 regarding this and she states it may be that he wants only a 30 day supply and not the 3 month supply so she said yep, he doesn't want to pay the big price but will do it month to month and it is set up for a year so she will remove it to stop it from being sent to [facility]; she is aware I will deny this one since it is there. She will prepare the Eliquis refill for him at this time.

## 2020-11-17 ENCOUNTER — Other Ambulatory Visit: Payer: Self-pay | Admitting: Physician Assistant

## 2020-11-17 NOTE — Telephone Encounter (Signed)
Eliquis 5mg  refill request received. Patient is 65 years old, weight-139.7kg, Crea-1.57 on 10/29/2020, Diagnosis-Afib, and last seen by Dr. 10/31/2020 on 10/29/2020. Dose is appropriate based on dosing criteria. Will send in refill to requested pharmacy.

## 2020-11-24 ENCOUNTER — Telehealth: Payer: Self-pay | Admitting: Physician Assistant

## 2020-11-24 MED ORDER — APIXABAN 5 MG PO TABS: 5.0000 mg | ORAL_TABLET | Freq: Two times a day (BID) | ORAL | 1 refills | Status: DC

## 2020-11-24 NOTE — Telephone Encounter (Signed)
*  STAT* If patient is at the pharmacy, call can be transferred to refill team.   1. Which medications need to be refilled? (please list name of each medication and dose if known)  Eliquis  2. Which pharmacy/location (including street and city if local pharmacy) is medication to be sent to? [facility]- [address], [place]  3. Do they need a 30 day or 90 day supply? 90 days and refills

## 2020-11-24 NOTE — Telephone Encounter (Signed)
Prescription refill request for Eliquis received.  Indication: A flutter Last office visit: 10/29/2020, Chandrasekhar Scr: 1.57, 10/29/2020 Age: 65 yo  Weight: 139.7 kg   Prescription refill sent.

## 2020-11-24 NOTE — Progress Notes (Signed)
Watchman Consult Note   Date:  11/25/2020   ID:  Lawrence Rose, DOB 05/20/1956, MRN [id number]  PCP:  [name], [name], MD  Cardiologist:  Dr [name] Primary Electrophysiologist: none Referring Physician: Dr [name]   CC: to discuss Watchman implant    History of Present Illness: Lawrence Rose is a 65 y.o. male referred by Dr [name] for evaluation of atrial fibrillation and stroke prevention. He has persistent atrial flutter.   The patient has been evaluated by their referring physician and is felt to be a poor candidate for long term [place] due to recent GI bleed.  He therefore presents today for Watchman evaluation.  The patient's medical history includes TAVR for treatment of severe aortic stenosis in November 2021.  Postoperatively he developed atrial flutter and was placed on apixaban and amiodarone at that time.  He was transitioned from aspirin and clopidogrel to apixaban when he experienced gastrointestinal bleeding about 5 days after making the transition.  He was noted to have erosive gastritis but no obvious bleeding source on upper endoscopy.   Today, he denies symptoms of palpitations, chest pain, shortness of breath, orthopnea, PND, lower extremity edema, claudication, dizziness, presyncope, syncope, bleeding, or neurologic sequela.  He states his breathing has improved significantly since undergoing TAVR.  The patient is tolerating medications without difficulties and is otherwise without complaint today.    Past Medical History:  Diagnosis Date  . Acid reflux   . Allergic arthritis, hand    per patient, both hands  . Anemia   . Apnea   . Asthma   . Carpal tunnel syndrome    per patient, both hands  . Chronic back pain   . Chronic kidney disease   . Chronic knee pain   . Diabetes mellitus without complication ([place])   . Dysrhythmia   . GI bleeding 09/2020  . Hypertension   . 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 Angelena Form and Dr. Cyndia Bent.   . Severe aortic stenosis   . Sleep apnea    wears CPAP every night   Past Surgical History:  Procedure Laterality Date  . BACK SURGERY    . ESOPHAGOGASTRODUODENOSCOPY (EGD) WITH PROPOFOL N/A 10/01/2020   Procedure: ESOPHAGOGASTRODUODENOSCOPY (EGD) WITH PROPOFOL;  Surgeon: [name], MD;  Location: [facility];  Service: Endoscopy;  Laterality: N/A;  . RIGHT/LEFT HEART CATH AND CORONARY ANGIOGRAPHY N/A 08/18/2020   Procedure: RIGHT/LEFT HEART CATH AND CORONARY ANGIOGRAPHY;  Surgeon: [name], MD;  Location: [facility];  Service: Cardiovascular;  Laterality: N/A;  . TEE WITHOUT CARDIOVERSION N/A 08/21/2020   Procedure: TRANSESOPHAGEAL ECHOCARDIOGRAM (TEE);  Surgeon: [name], MD;  Location: [facility];  Service: Cardiovascular;  Laterality: N/A;  . TEE WITHOUT CARDIOVERSION N/A 09/01/2020   Procedure: TRANSESOPHAGEAL ECHOCARDIOGRAM (TEE);  Surgeon: [name], MD;  Location: [facility];  Service: Open Heart Surgery;  Laterality: N/A;  . TRANSCATHETER AORTIC VALVE REPLACEMENT, TRANSFEMORAL N/A 09/01/2020   Procedure: TRANSCATHETER AORTIC VALVE REPLACEMENT, TRANSFEMORAL;  Surgeon: [name], MD;  Location: [facility];  Service: Open Heart Surgery;  Laterality: N/A;     Current Outpatient Medications  Medication Sig Dispense Refill  . albuterol (VENTOLIN HFA) 108 (90 Base) MCG/ACT inhaler Inhale 2 puffs into the lungs every 4 (four) hours as needed for wheezing or shortness of breath.     Marland Kitchen amiodarone (PACERONE)  200 MG tablet Take 1 tablet (200 mg total) by mouth daily. 90 tablet 3  . amoxicillin (AMOXIL) 500 MG capsule Take 2000 mg at least 1 hour prior to any dental procedure.    Marland Kitchen apixaban (ELIQUIS) 5 MG TABS tablet Take 1 tablet (5 mg total) by mouth 2 (two) times daily. 180 tablet 1  . Blood Glucose Monitoring Suppl  (FIFTY50 GLUCOSE METER 2.0) w/Device KIT See admin instructions.    . Cholecalciferol 25 MCG (1000 UT) tablet Take 1,000 Units by mouth at bedtime.     Marland Kitchen diltiazem (CARDIZEM CD) 240 MG 24 hr capsule Take 240 mg by mouth daily.    Marland Kitchen gabapentin (NEURONTIN) 300 MG capsule Take 600 mg by mouth 2 (two) times daily.     Marland Kitchen HYDROcodone-acetaminophen (NORCO) 10-325 MG tablet Take 1 tablet by mouth every 6 (six) hours as needed for moderate pain.     Marland Kitchen latanoprost (XALATAN) 0.005 % ophthalmic solution Place 1 drop into both eyes at bedtime.     . metFORMIN (GLUCOPHAGE) 1000 MG tablet Take 1,000 mg by mouth 2 (two) times daily.    . metoprolol tartrate (LOPRESSOR) 50 MG tablet Take 1 tablet (50 mg total) by mouth 2 (two) times daily. 180 tablet 3  . Multiple Vitamin (MULTI VITAMIN MENS PO) Take by mouth.    . Multiple Vitamin (MULTIVITAMIN WITH MINERALS) TABS tablet Take 1 tablet by mouth at bedtime.     Marland Kitchen oxyCODONE ER (XTAMPZA ER) 9 MG C12A Take 9 mg by mouth every 12 (twelve) hours.    . pantoprazole (PROTONIX) 40 MG tablet Take 40 mg by mouth 2 (two) times daily.    Marland Kitchen PRESCRIPTION MEDICATION Inhale into the lungs at bedtime. CPAP    . rosuvastatin (CRESTOR) 20 MG tablet Take 20 mg by mouth daily.      No current facility-administered medications for this visit.    Allergies:   Cocoa, Sulfamethoxazole-trimethoprim, Chocolate, Citrus, Fish allergy, Shellfish allergy, Sulfa antibiotics, and Tetracycline   Social History:  The patient  reports that he has quit smoking. He has never used smokeless tobacco. He reports previous drug use. He reports that he does not drink alcohol.   Family History:  The patient's  family history is not on file.    ROS:  Please see the history of present illness.   All other systems are reviewed and negative.    PHYSICAL EXAM: VS:  BP (!) 160/94   Pulse (!) 101   Ht '5\' 11"'  (1.803 m)   Wt (!) 311 lb 12.8 oz (141.4 kg)   SpO2 97%   BMI 43.49 kg/m  , BMI Body mass  index is 43.49 kg/m. GEN: pleasant, morbidly obese male, in wheelchair, in no acute distress  HEENT: normal  Neck: no JVD, carotid bruits, or masses Cardiac: irregularly irregular; no murmurs, rubs, or gallops,no edema  Respiratory:  clear to auscultation bilaterally, normal work of breathing GI: soft, nontender, nondistended, + BS MS: no deformity or atrophy  Skin: warm and dry  Neuro:  Strength and sensation are intact Psych: euthymic mood, full affect  EKG:  EKG is not ordered today.   Recent Labs: 08/27/2020: B Natriuretic Peptide 48.3 09/02/2020: Magnesium 2.0 10/14/2020: TSH 0.865 10/29/2020: ALT 13; BUN 20; Creatinine, Ser 1.57; Hemoglobin 10.0; Platelets 283; Potassium 4.7; Sodium 141    Lipid Panel     Component Value Date/Time   CHOL 127 08/17/2020 0351   TRIG 69 08/17/2020 0351   HDL 39 (L)  08/17/2020 0351   CHOLHDL 3.3 08/17/2020 0351   VLDL 14 08/17/2020 0351   LDLCALC 74 08/17/2020 0351     Wt Readings from Last 3 Encounters:  11/25/20 (!) 311 lb 12.8 oz (141.4 kg)  10/29/20 (!) 308 lb (139.7 kg)  10/14/20 (!) 320 lb 9.6 oz (145.4 kg)      Other studies Reviewed: Additional studies/ records that were reviewed today include:  Echo 09/30/2020: IMPRESSIONS    1. Left ventricular ejection fraction, by estimation, is 55 to 60%. The  left ventricle has normal function. The left ventricle has no regional  wall motion abnormalities. Left ventricular diastolic parameters are  indeterminate.  2. Right ventricular systolic function is normal. The right ventricular  size is normal. There is normal pulmonary artery systolic pressure. The  estimated right ventricular systolic pressure is 50.3 mmHg.  3. The mitral valve is normal in structure. Trivial mitral valve  regurgitation. No evidence of mitral stenosis.  4. The aortic valve has been repaired/replaced. Perivalvular Aortic valve  regurgitation is not visualized. No aortic stenosis is present. There is  a  29 mm CoreValve-Evolut Pro prosthetic (TAVR) valve present in the aortic  position. Procedure Date:  09/01/2020. Echo findings are consistent with normal structure and  function of the aortic valve prosthesis. Aortic valve area, by VTI  measures 2.40 cm. Aortic valve mean gradient measures 7.0 mmHg. Aortic  valve Vmax measures 1.88 m/s. Diminsionless  index 0.49.  5. The inferior vena cava is dilated in size with <50% respiratory  variability, suggesting right atrial pressure of 15 mmHg.     ASSESSMENT AND PLAN:  1.  Persistent atrial flutter: I have seen Lawrence Rose is a 65 y.o. male in the office today who has been referred by Dr [name] for a Watchman left atrial appendage closure device.  He has a history of persistent atrial flutter.  This patients CHA2DS2-VASc Score and unadjusted Ischemic Stroke Rate (% per year) is equal to 3.2 % stroke rate/year from a score of 3 which necessitates long term oral anticoagulation to prevent stroke. HasBled score is 3.    I have reviewed the watchman procedure indications with the patient.  I explained the procedural steps and demonstrated a procedural video animation to the patient and his son today.  I reviewed potential risks and typical post procedure recovery.  The patient does not wish to move forward with any further invasive procedures at this time.  He has tolerated Eliquis alone without recurrent GI bleeding to date.  His GI bleed occurred on a combination of Eliquis and aspirin.  He ran out of his Eliquis prescription and has had trouble getting a renewal.  He is picking up a 4-monthsupply today.  I did review consideration of cardioversion with him as I think he would do better with an attempt to maintain sinus rhythm considering his multiple comorbidities.  He is on amiodarone which should help him maintain sinus rhythm with cardioversion.  He certainly has risk factors for recurrent atrial dysrhythmias in the setting of valvular  heart disease, morbid obesity, hypertension, and obstructive sleep apnea.  I will arrange a follow-up appointment with Dr. CGasper Sellsin 4-6 weeks and DCCV can be consider if felt to be appropriate at that time. I would be happy to see him in the future if issues arise. Thanks  Current medicines are reviewed at length with the patient today.   The patient does not have concerns regarding his medicines.  The following changes were  made today:  none  Labs/ tests ordered today include:  No orders of the defined types were placed in this encounter.    Signed, Sherren Mocha, MD 11/25/2020  10:41 AM     [facility] [address] [place] [address] [address] [phone] (office) [phone] (fax)

## 2020-11-25 ENCOUNTER — Other Ambulatory Visit: Payer: Self-pay

## 2020-11-25 ENCOUNTER — Encounter: Payer: Self-pay | Admitting: Cardiovascular Disease

## 2020-11-25 ENCOUNTER — Ambulatory Visit: Payer: Medicare HMO | Admitting: Cardiovascular Disease

## 2020-11-25 VITALS — BP 160/94 | HR 101 | Ht 71.0 in | Wt 311.8 lb

## 2020-11-25 DIAGNOSIS — I4892 Unspecified atrial flutter: Secondary | ICD-10-CM

## 2020-11-25 NOTE — Patient Instructions (Signed)
Medication Instructions:  Make sure to take your Eliquis as directed! *If you need a refill on your cardiac medications before your next appointment, please call your pharmacy*  Follow-Up: Your provider recommends that you schedule a follow-up appointment in 3 months with Dr. Izora Ribas.

## 2020-12-24 ENCOUNTER — Encounter: Payer: Self-pay | Admitting: Internal Medicine

## 2020-12-24 ENCOUNTER — Ambulatory Visit: Payer: Medicare HMO | Admitting: Internal Medicine

## 2020-12-24 ENCOUNTER — Other Ambulatory Visit: Payer: Self-pay

## 2020-12-24 VITALS — BP 142/78 | HR 114 | Ht 71.0 in | Wt 311.0 lb

## 2020-12-24 DIAGNOSIS — Z5181 Encounter for therapeutic drug level monitoring: Secondary | ICD-10-CM

## 2020-12-24 DIAGNOSIS — I4892 Unspecified atrial flutter: Secondary | ICD-10-CM | POA: Diagnosis not present

## 2020-12-24 DIAGNOSIS — Z952 Presence of prosthetic heart valve: Secondary | ICD-10-CM

## 2020-12-24 DIAGNOSIS — E1159 Type 2 diabetes mellitus with other circulatory complications: Secondary | ICD-10-CM

## 2020-12-24 DIAGNOSIS — Z79899 Other long term (current) drug therapy: Secondary | ICD-10-CM | POA: Insufficient documentation

## 2020-12-24 DIAGNOSIS — I152 Hypertension secondary to endocrine disorders: Secondary | ICD-10-CM | POA: Insufficient documentation

## 2020-12-24 NOTE — Patient Instructions (Signed)
Medication Instructions:  Your physician recommends that you continue on your current medications as directed. Please refer to the Current Medication list given to you today.  *If you need a refill on your cardiac medications before your next appointment, please call your pharmacy*   Lab Work: NONE If you have labs (blood work) drawn today and your tests are completely normal, you will receive your results only by: Marland Kitchen MyChart Message (if you have MyChart) OR . A paper copy in the mail If you have any lab test that is abnormal or we need to change your treatment, we will call you to review the results.   Testing/Procedures: Your physician has requested that you have a CARDIOVERSION.  Due to recent COVID-19 restrictions implemented by our local and state authorities and in an effort to keep both patients and staff as safe as possible, our hospital system requires COVID-19 testing prior to certain scheduled hospital procedures.  Please go to [address] [facility]. [place], [place] [address] on January 15, 2021 at 11:05AM  .  This is a drive up testing site.  You will not need to exit your vehicle.  You will not be billed at the time of testing but may receive a bill later depending on your insurance. You must agree to self-quarantine from the time of your testing until the procedure date on January 18, 2021.  This should included staying home with ONLY the people you live with.  Avoid take-out, grocery store shopping or leaving the house for any non-emergent reason.  Failure to have your COVID-19 test done on the date and time you have been scheduled will result in cancellation of your procedure.  Please call our office at [phone] if you have any questions.  Your physician has requested that you have a Pulmonary Function Test. Patient Instructions for Pulmonary Function Test  1. Do not smoke within at least 1 hour before the test. 2. Do not consume Caffeine 4 hours prior to the test. 3. Do not consume  Alcohol within 4 hours prior to testing. 4. Do not perform any Vigorous Exercise within 30 minutes before the test. 5. Do not wear clothes that restrict the chest area or abdomen. Do not use albuterol or Xopenex 3 hours before the test or any other nebulizer medications or inhalers.    Follow-Up: At [facility], you and your health needs are our priority.  As part of our continuing mission to provide you with exceptional heart care, we have created designated Provider Care Teams.  These Care Teams include your primary Cardiologist (physician) and Advanced Practice Providers (APPs -  Physician Assistants and Nurse Practitioners) who all work together to provide you with the care you need, when you need it.  We recommend signing up for the patient portal called "MyChart".  Sign up information is provided on this After Visit Summary.  MyChart is used to connect with patients for Virtual Visits (Telemedicine).  Patients are able to view lab/test results, encounter notes, upcoming appointments, etc.  Non-urgent messages can be sent to your provider as well.   To learn more about what you can do with MyChart, go to [email or web address].    Your next appointment:   4-6 week(s)  The format for your next appointment:   In Person  Provider:   You may see Christell Constant, MD or one of the following Advanced Practice Providers on your designated Care Team:    Ronie Spies, PA-C  Jacolyn Reedy, PA-C  Other Instructions    Dear Lawrence Rose  You are scheduled for a Cardioversion on January 18, 2021 with Dr. Izora Ribas.  Please arrive at the [facility] (Main Entrance A) at [facility]: [address] [place], [place] [address] at 8 am. (1 hour prior to procedure unless lab work is needed; if lab work is needed arrive 1.5 hours ahead)  DIET: Nothing to eat or drink after midnight except a sip of water with medications (see medication instructions below)  Medication  Instructions:  Continue your anticoagulant: Eliquis You will need to continue your anticoagulant after your procedure until you  are told by your Provider that it is safe to stop   Labs: Day of Procedure   You must have a responsible person to drive you home and stay in the waiting area during your procedure. Failure to do so could result in cancellation.  Bring your insurance cards.  *Special Note: Every effort is made to have your procedure done on time. Occasionally there are emergencies that occur at the hospital that may cause delays. Please be patient if a delay does occur.

## 2020-12-24 NOTE — H&P (View-Only) (Signed)
Cardiology Office Note:    Date:  12/24/2020   ID:  Lawrence Rose, DOB 1955-12-05, MRN [id number]  PCP:  [name], [name], MD  [facility] Cardiologist:  Werner Lean, MD  [facility] Electrophysiologist:  None   CC: Post TAVR Follow Up  History of Present Illness:    Lawrence Rose is a 65 y.o. male with a hx of HTN, HLD, Morbid Obesity, OSA on CPAP, CKD Stage III, Atrial flutter on eliquis with recent GI bleed initially since inpatient with severe aortic stenosis s/p 09/01/20 TAVR. Post TAVR was diagnosed with AFl and put on eliquis and amiodarone.  This leg to GI bleed with 10/02/20 hospitalization.  Seen 10/29/20.  Since interval saw Dr. Burt Knack with no present plans for LAA occluder implantation.  Patient notes that he is doing relatively ok.  Since last visit notes  That he doesn't like the flutters.  He feels his heart racing at night and it is scary.   There are no interval hospital/ED visit.    No chest pain or pressure .  No SOB/DOE and no PND/Orthopnea.  No weight gain or leg swelling.    Ambulatory blood pressure not done.   Past Medical History:  Diagnosis Date  . Acid reflux   . Allergic arthritis, hand    per patient, both hands  . Anemia   . Apnea   . Asthma   . Carpal tunnel syndrome    per patient, both hands  . Chronic back pain   . Chronic kidney disease   . Chronic knee pain   . Diabetes mellitus without complication ([place])   . Dysrhythmia   . GI bleeding 09/2020  . Hypertension   . 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 Angelena Form and Dr. Cyndia Bent.   . Severe aortic stenosis   . Sleep apnea    wears CPAP every night    Past Surgical History:  Procedure Laterality Date  . BACK SURGERY    . ESOPHAGOGASTRODUODENOSCOPY (EGD) WITH PROPOFOL N/A 10/01/2020   Procedure: ESOPHAGOGASTRODUODENOSCOPY (EGD) WITH PROPOFOL;  Surgeon: [name],  MD;  Location: [facility];  Service: Endoscopy;  Laterality: N/A;  . RIGHT/LEFT HEART CATH AND CORONARY ANGIOGRAPHY N/A 08/18/2020   Procedure: RIGHT/LEFT HEART CATH AND CORONARY ANGIOGRAPHY;  Surgeon: [name], MD;  Location: [place] [facility];  Service: Cardiovascular;  Laterality: N/A;  . TEE WITHOUT CARDIOVERSION N/A 08/21/2020   Procedure: TRANSESOPHAGEAL ECHOCARDIOGRAM (TEE);  Surgeon: [name], MD;  Location: [facility];  Service: Cardiovascular;  Laterality: N/A;  . TEE WITHOUT CARDIOVERSION N/A 09/01/2020   Procedure: TRANSESOPHAGEAL ECHOCARDIOGRAM (TEE);  Surgeon: [name], MD;  Location: [place] [facility];  Service: Open Heart Surgery;  Laterality: N/A;  . TRANSCATHETER AORTIC VALVE REPLACEMENT, TRANSFEMORAL N/A 09/01/2020   Procedure: TRANSCATHETER AORTIC VALVE REPLACEMENT, TRANSFEMORAL;  Surgeon: [name], MD;  Location: [place] [facility];  Service: Open Heart Surgery;  Laterality: N/A;    Current Medications: Current Meds  Medication Sig  . albuterol (VENTOLIN HFA) 108 (90 Base) MCG/ACT inhaler Inhale 2 puffs into the lungs every 4 (four) hours as needed for wheezing or shortness of breath.   Marland Kitchen amiodarone (PACERONE) 200 MG tablet Take 1 tablet (200 mg total) by mouth daily.  Marland Kitchen amoxicillin (AMOXIL) 500 MG capsule Take 2000 mg at least 1 hour prior to any dental procedure.  Marland Kitchen  apixaban (ELIQUIS) 5 MG TABS tablet Take 1 tablet (5 mg total) by mouth 2 (two) times daily.  . Blood Glucose Monitoring Suppl (FIFTY50 GLUCOSE METER 2.0) w/Device KIT See admin instructions.  . Cholecalciferol 25 MCG (1000 UT) tablet Take 1,000 Units by mouth at bedtime.   Marland Kitchen diltiazem (CARDIZEM CD) 240 MG 24 hr capsule Take 240 mg by mouth daily.  Marland Kitchen gabapentin (NEURONTIN) 300 MG capsule Take 600 mg by mouth 2 (two) times daily.   Marland Kitchen HYDROcodone-acetaminophen (NORCO) 10-325 MG tablet Take 1 tablet by mouth every 6 (six) hours as needed for moderate pain.   Marland Kitchen  latanoprost (XALATAN) 0.005 % ophthalmic solution Place 1 drop into both eyes at bedtime.   . metFORMIN (GLUCOPHAGE) 1000 MG tablet Take 1,000 mg by mouth 2 (two) times daily.  . metoprolol tartrate (LOPRESSOR) 50 MG tablet Take 1 tablet (50 mg total) by mouth 2 (two) times daily.  . Multiple Vitamin (MULTI VITAMIN MENS PO) Take by mouth.  . Multiple Vitamin (MULTIVITAMIN WITH MINERALS) TABS tablet Take 1 tablet by mouth at bedtime.   Marland Kitchen oxyCODONE ER (XTAMPZA ER) 9 MG C12A Take 9 mg by mouth every 12 (twelve) hours.  . pantoprazole (PROTONIX) 40 MG tablet Take 40 mg by mouth 2 (two) times daily.  Marland Kitchen PRESCRIPTION MEDICATION Inhale into the lungs at bedtime. CPAP  . rosuvastatin (CRESTOR) 20 MG tablet Take 20 mg by mouth daily.      Allergies:   Cocoa, Sulfamethoxazole-trimethoprim, Chocolate, Citrus, Fish allergy, Shellfish allergy, Sulfa antibiotics, and Tetracycline   Social History   Socioeconomic History  . Marital status: Single    Spouse name: Not on file  . Number of children: Not on file  . Years of education: Not on file  . Highest education level: Not on file  Occupational History  . Not on file  Tobacco Use  . Smoking status: Former Research scientist (life sciences)  . Smokeless tobacco: Never Used  . Tobacco comment: per patient quit over 30 years ago  Vaping Use  . Vaping Use: Never used  Substance and Sexual Activity  . Alcohol use: No  . Drug use: Not Currently  . Sexual activity: Not on file  Other Topics Concern  . Not on file  Social History Narrative  . Not on file   Social Determinants of Health   Financial Resource Strain: Not on file  Food Insecurity: Not on file  Transportation Needs: Not on file  Physical Activity: Not on file  Stress: Not on file  Social Connections: Not on file    Social:  Dollene Cleveland always comes with his friend  Family History: History of coronary artery disease notable for no members. History of heart failure notable for no members. History of  arrhythmia notable for no members.  ROS:   Please see the history of present illness.    All other systems reviewed and are negative.  EKGs/Labs/Other Studies Reviewed:    The following studies were reviewed today:  EKG:   12/24/2020: AFl 114  10/29/20: Atrial Flutter rate 102 10/14/20  Atrial Flutter heart rate 98  Transthoracic Echocardiogram: Echo 09/30/20 IMPRESSIONS 1. Left ventricular ejection fraction, by estimation, is 55 to 60%. The  left ventricle has normal function. The left ventricle has no regional  wall motion abnormalities. Left ventricular diastolic parameters are  indeterminate.  2. Right ventricular systolic function is normal. The right ventricular  size is normal. There is normal pulmonary artery systolic pressure. The  estimated right ventricular systolic pressure  is 31.6 mmHg.  3. The mitral valve is normal in structure. Trivial mitral valve  regurgitation. No evidence of mitral stenosis.  4. The aortic valve has been repaired/replaced. Perivalvular Aortic valve  regurgitation is not visualized. No aortic stenosis is present. There is a  29 mm CoreValve-Evolut Pro prosthetic (TAVR) valve present in the aortic  position. Procedure Date:  09/01/2020. Echo findings are consistent with normal structure and  function of the aortic valve prosthesis. Aortic valve area, by VTI  measures 2.40 cm. Aortic valve mean gradient measures 7.0 mmHg. Aortic  valve Vmax measures 1.88 m/s. Diminsionless  index 0.49.  5. The inferior vena cava is dilated in size with <50% respiratory  variability, suggesting right atrial pressure of 15 mmHg.   Transesophageal Echocardiogram: Date:08/21/2020 Results: SIZING IMPRESSIONS  1. Severe AS (mean gradient 42 mmHg and peak velocity of 4.2 m/s); mild  AI.  2. Left ventricular ejection fraction, by estimation, is 70 to 75%. The  left ventricle has hyperdynamic function. There is severe left ventricular  hypertrophy.  3.  Right ventricular systolic function is normal. The right ventricular  size is normal.  4. Left atrial size was moderately dilated. No left atrial/left atrial  appendage thrombus was detected.  5. The mitral valve is normal in structure. Trivial mitral valve  regurgitation.  6. The aortic valve is tricuspid. Aortic valve regurgitation is mild.  Severe aortic valve stenosis.  7. There is Moderate (Grade III) plaque involving the descending aorta.  Sizing Measurements for Watchman Flex: 45 degree: 17 mm, Depth of 27 mm 90 18 mm Depth of 21 mm 135 26 mm, Depth of 30 mm 0 degree not done  Cardiac CT: Date: 08/19/20 Results: FINDINGS: Aortic Valve: Calcium score 1645 Tri leaflet with restricted motion  Aorta: No aneurysm moderate calcific atherosclerosis normal arch vessels  Sino-tubular Junction: 25 mm  Ascending Thoracic Aorta: 33 mm  Aortic Arch: 25 mm  Descending Thoracic Aorta: 25 mm  Sinus of Valsalva Measurements:  Non-coronary: 30.8 mm  Right - coronary: 28.2 mm  Left - coronary: 31.3 mm  Coronary Artery Height above Annulus:  Left Main: 11.1 mm above annulus  Right Coronary: 15.3 mm above annulus  Virtual Basal Annulus Measurements:  Maximum/Minimum Diameter: 27.4 mm x 22.8 mm  Perimeter: 82 mm  Area: 505 mm2  Coronary Arteries: Sufficient height above annulus for deployment  Optimum Fluoroscopic Angle for Delivery: LAO 3 Caudal 6 degrees  IMPRESSION: 1. Tri-leaflet AV with calcium score 1645 and annular area of 505 mm2 suitable for a 26 mm Sapien 3 valve  2.  Coronary arteries sufficient height above annulus for deployment  3.  Optimum angiographic angle for deployment LAO 3 Caudal 6 degrees  4. Some nodular calcification in annulus at base of left coronary cusp  5.  Normal aortic root 3.3 cm  Left/Right Heart Catheterizations: Date: 08/18/20 Results:  Widely patent coronary arteries  High cardiac output: 9.06  L/min by Fick and 9.23 L/min by thermal dilution.  Etiology uncertain.  Suppressed TSH with normal T4 raising question of T3 thyrotoxicosis versus other mechanism.  Severe pulmonary hypertension, mean pressure 48 mmHg, based upon hemodynamics WHO group 2  Pulmonary capillary wedge pressure mean 34 mmHg, pulmonary vascular resistance 1.55 Woods units.  Mean right atrial pressure 19 mmHg  Aortic valve gradient peak to peak 33 mmHg with mean gradient 22 mmHg.  Aortic valve area 1.94 cm by Fick cardiac output and 1.98 cm by thermodilution  LV function normal by echocardiography  with moderate LVH  Recent Labs: 08/27/2020: B Natriuretic Peptide 48.3 09/02/2020: Magnesium 2.0 10/14/2020: TSH 0.865 10/29/2020: ALT 13; BUN 20; Creatinine, Ser 1.57; Hemoglobin 10.0; Platelets 283; Potassium 4.7; Sodium 141  Recent Lipid Panel    Component Value Date/Time   CHOL 127 08/17/2020 0351   TRIG 69 08/17/2020 0351   HDL 39 (L) 08/17/2020 0351   CHOLHDL 3.3 08/17/2020 0351   VLDL 14 08/17/2020 0351   LDLCALC 74 08/17/2020 0351     Risk Assessment/Calculations:     CHA2DS2-VASc Score = 2  This indicates a 2.2% annual risk of stroke. The patient's score is based upon: CHF History: No HTN History: Yes Diabetes History: No Stroke History: No Vascular Disease History: No Age Score: 1 Gender Score: 0      Physical Exam:    VS:  BP (!) 142/78   Pulse (!) 114   Ht '5\' 11"'  (1.803 m)   Wt (!) 311 lb (141.1 kg)   SpO2 96%   BMI 43.38 kg/m     Wt Readings from Last 3 Encounters:  12/24/20 (!) 311 lb (141.1 kg)  11/25/20 (!) 311 lb 12.8 oz (141.4 kg)  10/29/20 (!) 308 lb (139.7 kg)     GEN: Obese Male, well developed in no acute distress HEENT: Normal NECK: No JVD; No carotid bruits LYMPHATICS: No lymphadenopathy CARDIAC: Irregularly irregular tachycardia, no murmurs, rubs, gallops (distant heart sounds) RESPIRATORY:  Clear to auscultation without rales, wheezing or rhonchi   ABDOMEN: Soft, non-tender, non-distended MUSCULOSKELETAL:  No edema; No deformity  SKIN: Warm and dry NEUROLOGIC:  Alert and oriented x 3 PSYCHIATRIC:  Normal affect   ASSESSMENT:    1. Atrial flutter, unspecified type ([place])   2. Therapeutic drug monitoring   3. Morbid obesity ([place])   4. S/P TAVR (transcatheter aortic valve replacement)   5. Hypertension associated with diabetes ([place])    PLAN:    In order of problems listed above:  Persistent Atrial Flutter OSA on CPAP Morbid Obesity - CHADSVASC=2. - TSH low normal 12.29.21, imaging notable for left atrial dilation and maximal left atrial appendage diameter 26 mm:  - Continue anticoagulation with eliquis.  - Continue rate control with metoprolol and diltiazem  - PFTs for amiodarone - Rhythm control options:  Continue amiodarone 200 mg PO daily - discussed the risks and benefits of DCCV; patient is amenable to DCCV  Severe Aortic Stenosis s/p TAVR - minimal residual gradient without evidence of significant paravalvular leak - antithrombotic plan: Eliquis - IE prophylasxis: augmentin 2 g PO PRN dental procedure  Essential Hypertension - continue home medications - discussed diet (DASH/low sodium), and exercise/weight loss interventions   Post cardioversion follow up unless new symptoms or abnormal test results warranting change in plan  Would be reasonable for  APP Follow up   Medication Adjustments/Labs and Tests Ordered: Current medicines are reviewed at length with the patient today.  Concerns regarding medicines are outlined above.  Orders Placed This Encounter  Procedures  . EKG 12-Lead  . Pulmonary Function Test   No orders of the defined types were placed in this encounter.   Patient Instructions  Medication Instructions:  Your physician recommends that you continue on your current medications as directed. Please refer to the Current Medication list given to you today.  *If you need a refill on your  cardiac medications before your next appointment, please call your pharmacy*   Lab Work: NONE If you have labs (blood work) drawn today and your tests  are completely normal, you will receive your results only by: Marland Kitchen MyChart Message (if you have MyChart) OR . A paper copy in the mail If you have any lab test that is abnormal or we need to change your treatment, we will call you to review the results.   Testing/Procedures: Your physician has requested that you have a CARDIOVERSION.  Due to recent COVID-19 restrictions implemented by our local and state authorities and in an effort to keep both patients and staff as safe as possible, our hospital system requires COVID-19 testing prior to certain scheduled hospital procedures.  Please go to [facility]. [place], [place] [id number] on January 15, 2021 at 11:05AM  .  This is a drive up testing site.  You will not need to exit your vehicle.  You will not be billed at the time of testing but may receive a bill later depending on your insurance. You must agree to self-quarantine from the time of your testing until the procedure date on January 18, 2021.  This should included staying home with ONLY the people you live with.  Avoid take-out, grocery store shopping or leaving the house for any non-emergent reason.  Failure to have your COVID-19 test done on the date and time you have been scheduled will result in cancellation of your procedure.  Please call our office at [phone] if you have any questions.  Your physician has requested that you have a Pulmonary Function Test. Patient Instructions for Pulmonary Function Test  1. Do not smoke within at least 1 hour before the test. 2. Do not consume Caffeine 4 hours prior to the test. 3. Do not consume Alcohol within 4 hours prior to testing. 4. Do not perform any Vigorous Exercise within 30 minutes before the test. 5. Do not wear clothes that restrict the chest area or abdomen. Do not use albuterol or  Xopenex 3 hours before the test or any other nebulizer medications or inhalers.    Follow-Up: At [facility], you and your health needs are our priority.  As part of our continuing mission to provide you with exceptional heart care, we have created designated Provider Care Teams.  These Care Teams include your primary Cardiologist (physician) and Advanced Practice Providers (APPs -  Physician Assistants and Nurse Practitioners) who all work together to provide you with the care you need, when you need it.  We recommend signing up for the patient portal called "MyChart".  Sign up information is provided on this After Visit Summary.  MyChart is used to connect with patients for Virtual Visits (Telemedicine).  Patients are able to view lab/test results, encounter notes, upcoming appointments, etc.  Non-urgent messages can be sent to your provider as well.   To learn more about what you can do with MyChart, go to [email or web address].    Your next appointment:   4-6 week(s)  The format for your next appointment:   In Person  Provider:   You may see Werner Lean, MD or one of the following Advanced Practice Providers on your designated Care Team:    Melina Copa, PA-C  Ermalinda Barrios, PA-C    Other Instructions    Dear Mirza Fessel  You are scheduled for a Cardioversion on January 18, 2021 with Dr. [name].  Please arrive at the [facility] (Main Entrance A) at [facility]: [address] [place], [place] [address] at 8 am. (1 hour prior to procedure unless lab work is needed; if lab work is  needed arrive 1.5 hours ahead)  DIET: Nothing to eat or drink after midnight except a sip of water with medications (see medication instructions below)  Medication Instructions:  Continue your anticoagulant: Eliquis You will need to continue your anticoagulant after your procedure until you  are told by your Provider that it is safe to stop   Labs: Day of  Procedure   You must have a responsible person to drive you home and stay in the waiting area during your procedure. Failure to do so could result in cancellation.  Bring your insurance cards.  *Special Note: Every effort is made to have your procedure done on time. Occasionally there are emergencies that occur at the hospital that may cause delays. Please be patient if a delay does occur.       Signed, Werner Lean, MD  12/24/2020 10:55 AM    [place]

## 2020-12-24 NOTE — Progress Notes (Signed)
Cardiology Office Note:    Date:  12/24/2020   ID:  Rudolfo Brandow, DOB [birth year], MRN [id number]  PCP:  [name], [name], MD  [facility] Cardiologist:  Werner Lean, MD  [facility] Electrophysiologist:  None   CC: Post TAVR Follow Up  History of Present Illness:    Kharee Lesesne is a 65 y.o. male with a hx of HTN, HLD, Morbid Obesity, OSA on CPAP, CKD Stage III, Atrial flutter on eliquis with recent GI bleed initially since inpatient with severe aortic stenosis s/p 09/01/20 TAVR. Post TAVR was diagnosed with AFl and put on eliquis and amiodarone.  This leg to GI bleed with 10/02/20 hospitalization.  Seen 10/29/20.  Since interval saw Dr. Burt Knack with no present plans for LAA occluder implantation.  Patient notes that he is doing relatively ok.  Since last visit notes  That he doesn't like the flutters.  He feels his heart racing at night and it is scary.   There are no interval hospital/ED visit.    No chest pain or pressure .  No SOB/DOE and no PND/Orthopnea.  No weight gain or leg swelling.    Ambulatory blood pressure not done.   Past Medical History:  Diagnosis Date  . Acid reflux   . Allergic arthritis, hand    per patient, both hands  . Anemia   . Apnea   . Asthma   . Carpal tunnel syndrome    per patient, both hands  . Chronic back pain   . Chronic kidney disease   . Chronic knee pain   . Diabetes mellitus without complication ([place])   . Dysrhythmia   . GI bleeding 09/2020  . Hypertension   . 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 Angelena Form and Dr. Cyndia Bent.   . Severe aortic stenosis   . Sleep apnea    wears CPAP every night    Past Surgical History:  Procedure Laterality Date  . BACK SURGERY    . ESOPHAGOGASTRODUODENOSCOPY (EGD) WITH PROPOFOL N/A 10/01/2020   Procedure: ESOPHAGOGASTRODUODENOSCOPY (EGD) WITH PROPOFOL;  Surgeon: [name],  MD;  Location: [place] ENDOSCOPY;  Service: Endoscopy;  Laterality: N/A;  . RIGHT/LEFT HEART CATH AND CORONARY ANGIOGRAPHY N/A 08/18/2020   Procedure: RIGHT/LEFT HEART CATH AND CORONARY ANGIOGRAPHY;  Surgeon: [name], MD;  Location: [place] [facility];  Service: Cardiovascular;  Laterality: N/A;  . TEE WITHOUT CARDIOVERSION N/A 08/21/2020   Procedure: TRANSESOPHAGEAL ECHOCARDIOGRAM (TEE);  Surgeon: [name], MD;  Location: [facility];  Service: Cardiovascular;  Laterality: N/A;  . TEE WITHOUT CARDIOVERSION N/A 09/01/2020   Procedure: TRANSESOPHAGEAL ECHOCARDIOGRAM (TEE);  Surgeon: [name], MD;  Location: [place] [facility];  Service: Open Heart Surgery;  Laterality: N/A;  . TRANSCATHETER AORTIC VALVE REPLACEMENT, TRANSFEMORAL N/A 09/01/2020   Procedure: TRANSCATHETER AORTIC VALVE REPLACEMENT, TRANSFEMORAL;  Surgeon: [name], MD;  Location: [place] [facility];  Service: Open Heart Surgery;  Laterality: N/A;    Current Medications: Current Meds  Medication Sig  . albuterol (VENTOLIN HFA) 108 (90 Base) MCG/ACT inhaler Inhale 2 puffs into the lungs every 4 (four) hours as needed for wheezing or shortness of breath.   Marland Kitchen amiodarone (PACERONE) 200 MG tablet Take 1 tablet (200 mg total) by mouth daily.  Marland Kitchen amoxicillin (AMOXIL) 500 MG capsule Take 2000 mg at least 1 hour prior to any dental procedure.  Marland Kitchen  apixaban (ELIQUIS) 5 MG TABS tablet Take 1 tablet (5 mg total) by mouth 2 (two) times daily.  . Blood Glucose Monitoring Suppl (FIFTY50 GLUCOSE METER 2.0) w/Device KIT See admin instructions.  . Cholecalciferol 25 MCG (1000 UT) tablet Take 1,000 Units by mouth at bedtime.   Marland Kitchen diltiazem (CARDIZEM CD) 240 MG 24 hr capsule Take 240 mg by mouth daily.  Marland Kitchen gabapentin (NEURONTIN) 300 MG capsule Take 600 mg by mouth 2 (two) times daily.   Marland Kitchen HYDROcodone-acetaminophen (NORCO) 10-325 MG tablet Take 1 tablet by mouth every 6 (six) hours as needed for moderate pain.   Marland Kitchen  latanoprost (XALATAN) 0.005 % ophthalmic solution Place 1 drop into both eyes at bedtime.   . metFORMIN (GLUCOPHAGE) 1000 MG tablet Take 1,000 mg by mouth 2 (two) times daily.  . metoprolol tartrate (LOPRESSOR) 50 MG tablet Take 1 tablet (50 mg total) by mouth 2 (two) times daily.  . Multiple Vitamin (MULTI VITAMIN MENS PO) Take by mouth.  . Multiple Vitamin (MULTIVITAMIN WITH MINERALS) TABS tablet Take 1 tablet by mouth at bedtime.   Marland Kitchen oxyCODONE ER (XTAMPZA ER) 9 MG C12A Take 9 mg by mouth every 12 (twelve) hours.  . pantoprazole (PROTONIX) 40 MG tablet Take 40 mg by mouth 2 (two) times daily.  Marland Kitchen PRESCRIPTION MEDICATION Inhale into the lungs at bedtime. CPAP  . rosuvastatin (CRESTOR) 20 MG tablet Take 20 mg by mouth daily.      Allergies:   Cocoa, Sulfamethoxazole-trimethoprim, Chocolate, Citrus, Fish allergy, Shellfish allergy, Sulfa antibiotics, and Tetracycline   Social History   Socioeconomic History  . Marital status: Single    Spouse name: Not on file  . Number of children: Not on file  . Years of education: Not on file  . Highest education level: Not on file  Occupational History  . Not on file  Tobacco Use  . Smoking status: Former Research scientist (life sciences)  . Smokeless tobacco: Never Used  . Tobacco comment: per patient quit over 30 years ago  Vaping Use  . Vaping Use: Never used  Substance and Sexual Activity  . Alcohol use: No  . Drug use: Not Currently  . Sexual activity: Not on file  Other Topics Concern  . Not on file  Social History Narrative  . Not on file   Social Determinants of Health   Financial Resource Strain: Not on file  Food Insecurity: Not on file  Transportation Needs: Not on file  Physical Activity: Not on file  Stress: Not on file  Social Connections: Not on file    Social:  Dollene Cleveland always comes with his friend  Family History: History of coronary artery disease notable for no members. History of heart failure notable for no members. History of  arrhythmia notable for no members.  ROS:   Please see the history of present illness.    All other systems reviewed and are negative.  EKGs/Labs/Other Studies Reviewed:    The following studies were reviewed today:  EKG:   12/24/2020: AFl 114  10/29/20: Atrial Flutter rate 102 10/14/20  Atrial Flutter heart rate 98  Transthoracic Echocardiogram: Echo 09/30/20 IMPRESSIONS 1. Left ventricular ejection fraction, by estimation, is 55 to 60%. The  left ventricle has normal function. The left ventricle has no regional  wall motion abnormalities. Left ventricular diastolic parameters are  indeterminate.  2. Right ventricular systolic function is normal. The right ventricular  size is normal. There is normal pulmonary artery systolic pressure. The  estimated right ventricular systolic pressure  is 31.6 mmHg.  3. The mitral valve is normal in structure. Trivial mitral valve  regurgitation. No evidence of mitral stenosis.  4. The aortic valve has been repaired/replaced. Perivalvular Aortic valve  regurgitation is not visualized. No aortic stenosis is present. There is a  29 mm CoreValve-Evolut Pro prosthetic (TAVR) valve present in the aortic  position. Procedure Date:  09/01/2020. Echo findings are consistent with normal structure and  function of the aortic valve prosthesis. Aortic valve area, by VTI  measures 2.40 cm. Aortic valve mean gradient measures 7.0 mmHg. Aortic  valve Vmax measures 1.88 m/s. Diminsionless  index 0.49.  5. The inferior vena cava is dilated in size with <50% respiratory  variability, suggesting right atrial pressure of 15 mmHg.   Transesophageal Echocardiogram: Date:08/21/2020 Results: SIZING IMPRESSIONS  1. Severe AS (mean gradient 42 mmHg and peak velocity of 4.2 m/s); mild  AI.  2. Left ventricular ejection fraction, by estimation, is 70 to 75%. The  left ventricle has hyperdynamic function. There is severe left ventricular  hypertrophy.  3.  Right ventricular systolic function is normal. The right ventricular  size is normal.  4. Left atrial size was moderately dilated. No left atrial/left atrial  appendage thrombus was detected.  5. The mitral valve is normal in structure. Trivial mitral valve  regurgitation.  6. The aortic valve is tricuspid. Aortic valve regurgitation is mild.  Severe aortic valve stenosis.  7. There is Moderate (Grade III) plaque involving the descending aorta.  Sizing Measurements for Watchman Flex: 45 degree: 17 mm, Depth of 27 mm 90 18 mm Depth of 21 mm 135 26 mm, Depth of 30 mm 0 degree not done  Cardiac CT: Date: 08/19/20 Results: FINDINGS: Aortic Valve: Calcium score 1645 Tri leaflet with restricted motion  Aorta: No aneurysm moderate calcific atherosclerosis normal arch vessels  Sino-tubular Junction: 25 mm  Ascending Thoracic Aorta: 33 mm  Aortic Arch: 25 mm  Descending Thoracic Aorta: 25 mm  Sinus of Valsalva Measurements:  Non-coronary: 30.8 mm  Right - coronary: 28.2 mm  Left - coronary: 31.3 mm  Coronary Artery Height above Annulus:  Left Main: 11.1 mm above annulus  Right Coronary: 15.3 mm above annulus  Virtual Basal Annulus Measurements:  Maximum/Minimum Diameter: 27.4 mm x 22.8 mm  Perimeter: 82 mm  Area: 505 mm2  Coronary Arteries: Sufficient height above annulus for deployment  Optimum Fluoroscopic Angle for Delivery: LAO 3 Caudal 6 degrees  IMPRESSION: 1. Tri-leaflet AV with calcium score 1645 and annular area of 505 mm2 suitable for a 26 mm Sapien 3 valve  2.  Coronary arteries sufficient height above annulus for deployment  3.  Optimum angiographic angle for deployment LAO 3 Caudal 6 degrees  4. Some nodular calcification in annulus at base of left coronary cusp  5.  Normal aortic root 3.3 cm  Left/Right Heart Catheterizations: Date: 08/18/20 Results:  Widely patent coronary arteries  High cardiac output: 9.06  L/min by Fick and 9.23 L/min by thermal dilution.  Etiology uncertain.  Suppressed TSH with normal T4 raising question of T3 thyrotoxicosis versus other mechanism.  Severe pulmonary hypertension, mean pressure 48 mmHg, based upon hemodynamics WHO group 2  Pulmonary capillary wedge pressure mean 34 mmHg, pulmonary vascular resistance 1.55 Woods units.  Mean right atrial pressure 19 mmHg  Aortic valve gradient peak to peak 33 mmHg with mean gradient 22 mmHg.  Aortic valve area 1.94 cm by Fick cardiac output and 1.98 cm by thermodilution  LV function normal by echocardiography  with moderate LVH  Recent Labs: 08/27/2020: B Natriuretic Peptide 48.3 09/02/2020: Magnesium 2.0 10/14/2020: TSH 0.865 10/29/2020: ALT 13; BUN 20; Creatinine, Ser 1.57; Hemoglobin 10.0; Platelets 283; Potassium 4.7; Sodium 141  Recent Lipid Panel    Component Value Date/Time   CHOL 127 08/17/2020 0351   TRIG 69 08/17/2020 0351   HDL 39 (L) 08/17/2020 0351   CHOLHDL 3.3 08/17/2020 0351   VLDL 14 08/17/2020 0351   LDLCALC 74 08/17/2020 0351     Risk Assessment/Calculations:     CHA2DS2-VASc Score = 2  This indicates a 2.2% annual risk of stroke. The patient's score is based upon: CHF History: No HTN History: Yes Diabetes History: No Stroke History: No Vascular Disease History: No Age Score: 1 Gender Score: 0      Physical Exam:    VS:  BP (!) 142/78   Pulse (!) 114   Ht '5\' 11"'  (1.803 m)   Wt (!) 311 lb (141.1 kg)   SpO2 96%   BMI 43.38 kg/m     Wt Readings from Last 3 Encounters:  12/24/20 (!) 311 lb (141.1 kg)  11/25/20 (!) 311 lb 12.8 oz (141.4 kg)  10/29/20 (!) 308 lb (139.7 kg)     GEN: Obese Male, well developed in no acute distress HEENT: Normal NECK: No JVD; No carotid bruits LYMPHATICS: No lymphadenopathy CARDIAC: Irregularly irregular tachycardia, no murmurs, rubs, gallops (distant heart sounds) RESPIRATORY:  Clear to auscultation without rales, wheezing or rhonchi   ABDOMEN: Soft, non-tender, non-distended MUSCULOSKELETAL:  No edema; No deformity  SKIN: Warm and dry NEUROLOGIC:  Alert and oriented x 3 PSYCHIATRIC:  Normal affect   ASSESSMENT:    1. Atrial flutter, unspecified type ([place])   2. Therapeutic drug monitoring   3. Morbid obesity ([place])   4. S/P TAVR (transcatheter aortic valve replacement)   5. Hypertension associated with diabetes ([place])    PLAN:    In order of problems listed above:  Persistent Atrial Flutter OSA on CPAP Morbid Obesity - CHADSVASC=2. - TSH low normal 12.29.21, imaging notable for left atrial dilation and maximal left atrial appendage diameter 26 mm:  - Continue anticoagulation with eliquis.  - Continue rate control with metoprolol and diltiazem  - PFTs for amiodarone - Rhythm control options:  Continue amiodarone 200 mg PO daily - discussed the risks and benefits of DCCV; patient is amenable to DCCV  Severe Aortic Stenosis s/p TAVR - minimal residual gradient without evidence of significant paravalvular leak - antithrombotic plan: Eliquis - IE prophylasxis: augmentin 2 g PO PRN dental procedure  Essential Hypertension - continue home medications - discussed diet (DASH/low sodium), and exercise/weight loss interventions   Post cardioversion follow up unless new symptoms or abnormal test results warranting change in plan  Would be reasonable for  APP Follow up   Medication Adjustments/Labs and Tests Ordered: Current medicines are reviewed at length with the patient today.  Concerns regarding medicines are outlined above.  Orders Placed This Encounter  Procedures  . EKG 12-Lead  . Pulmonary Function Test   No orders of the defined types were placed in this encounter.   Patient Instructions  Medication Instructions:  Your physician recommends that you continue on your current medications as directed. Please refer to the Current Medication list given to you today.  *If you need a refill on your  cardiac medications before your next appointment, please call your pharmacy*   Lab Work: NONE If you have labs (blood work) drawn today and your tests  are completely normal, you will receive your results only by: Marland Kitchen MyChart Message (if you have MyChart) OR . A paper copy in the mail If you have any lab test that is abnormal or we need to change your treatment, we will call you to review the results.   Testing/Procedures: Your physician has requested that you have a CARDIOVERSION.  Due to recent COVID-19 restrictions implemented by our local and state authorities and in an effort to keep both patients and staff as safe as possible, our hospital system requires COVID-19 testing prior to certain scheduled hospital procedures.  Please go to [facility]. [place], [place] [address] on January 15, 2021 at 11:05AM  .  This is a drive up testing site.  You will not need to exit your vehicle.  You will not be billed at the time of testing but may receive a bill later depending on your insurance. You must agree to self-quarantine from the time of your testing until the procedure date on January 18, 2021.  This should included staying home with ONLY the people you live with.  Avoid take-out, grocery store shopping or leaving the house for any non-emergent reason.  Failure to have your COVID-19 test done on the date and time you have been scheduled will result in cancellation of your procedure.  Please call our office at [phone] if you have any questions.  Your physician has requested that you have a Pulmonary Function Test. Patient Instructions for Pulmonary Function Test  1. Do not smoke within at least 1 hour before the test. 2. Do not consume Caffeine 4 hours prior to the test. 3. Do not consume Alcohol within 4 hours prior to testing. 4. Do not perform any Vigorous Exercise within 30 minutes before the test. 5. Do not wear clothes that restrict the chest area or abdomen. Do not use albuterol or  Xopenex 3 hours before the test or any other nebulizer medications or inhalers.    Follow-Up: At [facility], you and your health needs are our priority.  As part of our continuing mission to provide you with exceptional heart care, we have created designated Provider Care Teams.  These Care Teams include your primary Cardiologist (physician) and Advanced Practice Providers (APPs -  Physician Assistants and Nurse Practitioners) who all work together to provide you with the care you need, when you need it.  We recommend signing up for the patient portal called "MyChart".  Sign up information is provided on this After Visit Summary.  MyChart is used to connect with patients for Virtual Visits (Telemedicine).  Patients are able to view lab/test results, encounter notes, upcoming appointments, etc.  Non-urgent messages can be sent to your provider as well.   To learn more about what you can do with MyChart, go to [email or web address].    Your next appointment:   4-6 week(s)  The format for your next appointment:   In Person  Provider:   You may see Werner Lean, MD or one of the following Advanced Practice Providers on your designated Care Team:    Melina Copa, PA-C  Ermalinda Barrios, PA-C    Other Instructions    Dear Maximos Zayas  You are scheduled for a Cardioversion on January 18, 2021 with Dr. [name].  Please arrive at the [facility] (Main Entrance A) at [facility]: [address] [place], [place] [address] at 8 am. (1 hour prior to procedure unless lab work is needed; if lab work is  needed arrive 1.5 hours ahead)  DIET: Nothing to eat or drink after midnight except a sip of water with medications (see medication instructions below)  Medication Instructions:  Continue your anticoagulant: Eliquis You will need to continue your anticoagulant after your procedure until you  are told by your Provider that it is safe to stop   Labs: Day of  Procedure   You must have a responsible person to drive you home and stay in the waiting area during your procedure. Failure to do so could result in cancellation.  Bring your insurance cards.  *Special Note: Every effort is made to have your procedure done on time. Occasionally there are emergencies that occur at the hospital that may cause delays. Please be patient if a delay does occur.       Signed, Werner Lean, MD  12/24/2020 10:55 AM    [place]

## 2020-12-30 ENCOUNTER — Telehealth: Payer: Self-pay | Admitting: Internal Medicine

## 2020-12-30 NOTE — Telephone Encounter (Signed)
Patient was calling to confirm what time he would need to be at the hospital for his procedure on 01/18/21. Please advise

## 2020-12-30 NOTE — Telephone Encounter (Signed)
Called patient informed him to be at [facility] at 0800 on 01/18/21 for Cardioversion.  He had no further questions or concerns.

## 2021-01-04 ENCOUNTER — Other Ambulatory Visit (HOSPITAL_COMMUNITY)
Admission: RE | Admit: 2021-01-04 | Discharge: 2021-01-04 | Disposition: A | Discharge status: Home / Self Care | Payer: Medicare HMO | Source: Ambulatory Visit | Attending: Internal Medicine | Admitting: Internal Medicine

## 2021-01-04 DIAGNOSIS — Z20822 Contact with and (suspected) exposure to covid-19: Secondary | ICD-10-CM | POA: Diagnosis not present

## 2021-01-04 DIAGNOSIS — Z01812 Encounter for preprocedural laboratory examination: Secondary | ICD-10-CM | POA: Diagnosis present

## 2021-01-05 LAB — SARS CORONAVIRUS 2 (TAT 6-24 HRS): SARS Coronavirus 2: NEGATIVE

## 2021-01-07 ENCOUNTER — Ambulatory Visit (INDEPENDENT_AMBULATORY_CARE_PROVIDER_SITE_OTHER): Payer: Medicare HMO | Admitting: Internal Medicine

## 2021-01-07 ENCOUNTER — Other Ambulatory Visit: Payer: Self-pay

## 2021-01-07 DIAGNOSIS — Z5181 Encounter for therapeutic drug level monitoring: Secondary | ICD-10-CM

## 2021-01-07 DIAGNOSIS — I4892 Unspecified atrial flutter: Secondary | ICD-10-CM

## 2021-01-07 LAB — PULMONARY FUNCTION TEST
DL/VA % pred: 129 %
DL/VA: 5.28 ml/min/mmHg/L
DLCO cor % pred: 74 %
DLCO cor: 20.27 ml/min/mmHg
DLCO unc % pred: 74 %
DLCO unc: 20.27 ml/min/mmHg
FEF 25-75 Post: 2.11 L/sec
FEF 25-75 Pre: 1.48 L/sec
FEF2575-%Change-Post: 42 %
FEF2575-%Pred-Post: 77 %
FEF2575-%Pred-Pre: 54 %
FEV1-%Change-Post: 9 %
FEV1-%Pred-Post: 60 %
FEV1-%Pred-Pre: 55 %
FEV1-Post: 1.87 L
FEV1-Pre: 1.71 L
FEV1FVC-%Change-Post: 3 %
FEV1FVC-%Pred-Pre: 100 %
FEV6-%Change-Post: 6 %
FEV6-%Pred-Post: 60 %
FEV6-%Pred-Pre: 56 %
FEV6-Post: 2.34 L
FEV6-Pre: 2.2 L
FEV6FVC-%Change-Post: 0 %
FEV6FVC-%Pred-Post: 104 %
FEV6FVC-%Pred-Pre: 103 %
FVC-%Change-Post: 5 %
FVC-%Pred-Post: 57 %
FVC-%Pred-Pre: 54 %
FVC-Post: 2.34 L
FVC-Pre: 2.22 L
Post FEV1/FVC ratio: 80 %
Post FEV6/FVC ratio: 100 %
Pre FEV1/FVC ratio: 77 %
Pre FEV6/FVC Ratio: 99 %
RV % pred: 84 %
RV: 2.06 L
TLC % pred: 63 %
TLC: 4.59 L

## 2021-01-07 NOTE — Progress Notes (Signed)
Full PFT performed today. °

## 2021-01-07 NOTE — Patient Instructions (Signed)
Full PFT performed today. °

## 2021-01-12 ENCOUNTER — Telehealth: Payer: Self-pay

## 2021-01-12 DIAGNOSIS — J439 Emphysema, unspecified: Secondary | ICD-10-CM

## 2021-01-12 DIAGNOSIS — J984 Other disorders of lung: Secondary | ICD-10-CM

## 2021-01-12 NOTE — Telephone Encounter (Signed)
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

## 2021-01-12 NOTE — Telephone Encounter (Signed)
The patient has been notified of the result and verbalized understanding.  All questions (if any) were answered.  Placed an order for pulmonlogy referral let patient know that someone will call him to schedule appointment.  Macie Burows, RN 01/12/2021 3:58 PM

## 2021-01-15 ENCOUNTER — Other Ambulatory Visit (HOSPITAL_COMMUNITY)
Admission: RE | Admit: 2021-01-15 | Discharge: 2021-01-15 | Disposition: A | Discharge status: Home / Self Care | Payer: Medicare HMO | Source: Ambulatory Visit | Attending: Internal Medicine | Admitting: Internal Medicine

## 2021-01-15 DIAGNOSIS — Z01812 Encounter for preprocedural laboratory examination: Secondary | ICD-10-CM | POA: Diagnosis present

## 2021-01-15 DIAGNOSIS — Z20822 Contact with and (suspected) exposure to covid-19: Secondary | ICD-10-CM | POA: Diagnosis not present

## 2021-01-16 LAB — SARS CORONAVIRUS 2 (TAT 6-24 HRS): SARS Coronavirus 2: NEGATIVE

## 2021-01-18 ENCOUNTER — Encounter (HOSPITAL_COMMUNITY)
Admission: RE | Disposition: A | Discharge status: Home / Self Care | Payer: Self-pay | Source: Home / Self Care | Attending: Internal Medicine

## 2021-01-18 ENCOUNTER — Other Ambulatory Visit: Payer: Self-pay

## 2021-01-18 ENCOUNTER — Observation Stay (HOSPITAL_COMMUNITY)
Admission: RE | Admit: 2021-01-18 | Discharge: 2021-01-19 | Disposition: A | Discharge status: Home / Self Care | Payer: Medicare HMO | Attending: Cardiovascular Disease | Admitting: Cardiovascular Disease

## 2021-01-18 ENCOUNTER — Other Ambulatory Visit (HOSPITAL_COMMUNITY): Payer: Medicare HMO

## 2021-01-18 ENCOUNTER — Ambulatory Visit (HOSPITAL_COMMUNITY): Payer: Medicare HMO | Admitting: Anesthesiology

## 2021-01-18 ENCOUNTER — Encounter (HOSPITAL_COMMUNITY): Payer: Self-pay | Admitting: Internal Medicine

## 2021-01-18 DIAGNOSIS — I5030 Unspecified diastolic (congestive) heart failure: Secondary | ICD-10-CM

## 2021-01-18 DIAGNOSIS — I5031 Acute diastolic (congestive) heart failure: Secondary | ICD-10-CM | POA: Insufficient documentation

## 2021-01-18 DIAGNOSIS — Z954 Presence of other heart-valve replacement: Secondary | ICD-10-CM | POA: Insufficient documentation

## 2021-01-18 DIAGNOSIS — Z7901 Long term (current) use of anticoagulants: Secondary | ICD-10-CM | POA: Diagnosis not present

## 2021-01-18 DIAGNOSIS — I4891 Unspecified atrial fibrillation: Secondary | ICD-10-CM | POA: Diagnosis not present

## 2021-01-18 DIAGNOSIS — I4892 Unspecified atrial flutter: Principal | ICD-10-CM | POA: Diagnosis present

## 2021-01-18 DIAGNOSIS — I483 Typical atrial flutter: Secondary | ICD-10-CM

## 2021-01-18 DIAGNOSIS — I5020 Unspecified systolic (congestive) heart failure: Secondary | ICD-10-CM | POA: Diagnosis present

## 2021-01-18 DIAGNOSIS — N1831 Chronic kidney disease, stage 3a: Secondary | ICD-10-CM | POA: Insufficient documentation

## 2021-01-18 DIAGNOSIS — Z87891 Personal history of nicotine dependence: Secondary | ICD-10-CM | POA: Insufficient documentation

## 2021-01-18 DIAGNOSIS — N183 Chronic kidney disease, stage 3 unspecified: Secondary | ICD-10-CM | POA: Diagnosis present

## 2021-01-18 DIAGNOSIS — Z7984 Long term (current) use of oral hypoglycemic drugs: Secondary | ICD-10-CM | POA: Diagnosis not present

## 2021-01-18 DIAGNOSIS — Z79899 Other long term (current) drug therapy: Secondary | ICD-10-CM | POA: Insufficient documentation

## 2021-01-18 DIAGNOSIS — Z952 Presence of prosthetic heart valve: Secondary | ICD-10-CM

## 2021-01-18 DIAGNOSIS — I350 Nonrheumatic aortic (valve) stenosis: Secondary | ICD-10-CM | POA: Diagnosis not present

## 2021-01-18 DIAGNOSIS — I160 Hypertensive urgency: Secondary | ICD-10-CM

## 2021-01-18 DIAGNOSIS — I480 Paroxysmal atrial fibrillation: Secondary | ICD-10-CM

## 2021-01-18 DIAGNOSIS — E1122 Type 2 diabetes mellitus with diabetic chronic kidney disease: Secondary | ICD-10-CM | POA: Diagnosis not present

## 2021-01-18 DIAGNOSIS — I130 Hypertensive heart and chronic kidney disease with heart failure and stage 1 through stage 4 chronic kidney disease, or unspecified chronic kidney disease: Secondary | ICD-10-CM | POA: Insufficient documentation

## 2021-01-18 DIAGNOSIS — J45909 Unspecified asthma, uncomplicated: Secondary | ICD-10-CM | POA: Insufficient documentation

## 2021-01-18 HISTORY — PX: CARDIOVERSION: SHX1299

## 2021-01-18 LAB — CBC WITH DIFFERENTIAL/PLATELET
Abs Immature Granulocytes: 0.02 10*3/uL (ref 0.00–0.07)
Basophils Absolute: 0.1 10*3/uL (ref 0.0–0.1)
Basophils Relative: 1 %
Eosinophils Absolute: 0.1 10*3/uL (ref 0.0–0.5)
Eosinophils Relative: 1 %
HCT: 37.6 % — ABNORMAL LOW (ref 39.0–52.0)
Hemoglobin: 11.1 g/dL — ABNORMAL LOW (ref 13.0–17.0)
Immature Granulocytes: 0 %
Lymphocytes Relative: 18 %
Lymphs Abs: 1.3 10*3/uL (ref 0.7–4.0)
MCH: 25.8 pg — ABNORMAL LOW (ref 26.0–34.0)
MCHC: 29.5 g/dL — ABNORMAL LOW (ref 30.0–36.0)
MCV: 87.2 fL (ref 80.0–100.0)
Monocytes Absolute: 0.6 10*3/uL (ref 0.1–1.0)
Monocytes Relative: 8 %
Neutro Abs: 5.1 10*3/uL (ref 1.7–7.7)
Neutrophils Relative %: 72 %
Platelets: 230 10*3/uL (ref 150–400)
RBC: 4.31 MIL/uL (ref 4.22–5.81)
RDW: 17.3 % — ABNORMAL HIGH (ref 11.5–15.5)
WBC: 7.1 10*3/uL (ref 4.0–10.5)
nRBC: 0 % (ref 0.0–0.2)

## 2021-01-18 LAB — BASIC METABOLIC PANEL
Anion gap: 11 (ref 5–15)
BUN: 24 mg/dL — ABNORMAL HIGH (ref 8–23)
CO2: 30 mmol/L (ref 22–32)
Calcium: 9.5 mg/dL (ref 8.9–10.3)
Chloride: 100 mmol/L (ref 98–111)
Creatinine, Ser: 1.53 mg/dL — ABNORMAL HIGH (ref 0.61–1.24)
GFR, Estimated: 50 mL/min — ABNORMAL LOW (ref >60)
Glucose, Bld: 100 mg/dL — ABNORMAL HIGH (ref 70–99)
Potassium: 4.3 mmol/L (ref 3.5–5.1)
Sodium: 141 mmol/L (ref 135–145)

## 2021-01-18 LAB — POCT I-STAT, CHEM 8
BUN: 28 mg/dL — ABNORMAL HIGH (ref 8–23)
Calcium, Ion: 1.24 mmol/L (ref 1.15–1.40)
Chloride: 102 mmol/L (ref 98–111)
Creatinine, Ser: 1.5 mg/dL — ABNORMAL HIGH (ref 0.61–1.24)
Glucose, Bld: 114 mg/dL — ABNORMAL HIGH (ref 70–99)
HCT: 35 % — ABNORMAL LOW (ref 39.0–52.0)
Hemoglobin: 11.9 g/dL — ABNORMAL LOW (ref 13.0–17.0)
Potassium: 4.3 mmol/L (ref 3.5–5.1)
Sodium: 142 mmol/L (ref 135–145)
TCO2: 30 mmol/L (ref 22–32)

## 2021-01-18 LAB — GLUCOSE, CAPILLARY
Glucose-Capillary: 195 mg/dL — ABNORMAL HIGH (ref 70–99)
Glucose-Capillary: 92 mg/dL (ref 70–99)

## 2021-01-18 LAB — BRAIN NATRIURETIC PEPTIDE: B Natriuretic Peptide: 268.1 pg/mL — ABNORMAL HIGH (ref 0.0–100.0)

## 2021-01-18 LAB — MAGNESIUM: Magnesium: 2.1 mg/dL (ref 1.7–2.4)

## 2021-01-18 LAB — HEMOGLOBIN A1C
Hgb A1c MFr Bld: 5.6 % (ref 4.8–5.6)
Mean Plasma Glucose: 114.02 mg/dL

## 2021-01-18 SURGERY — CARDIOVERSION
Anesthesia: General

## 2021-01-18 MED ORDER — VITAMIN D 25 MCG (1000 UNIT) PO TABS
1000.0000 [IU] | ORAL_TABLET | Freq: Every day | ORAL | Status: DC
  Administered 2021-01-18: 1000 [IU] via ORAL
  Filled 2021-01-18: qty 1

## 2021-01-18 MED ORDER — FUROSEMIDE 10 MG/ML IJ SOLN
INTRAMUSCULAR | Status: AC
  Filled 2021-01-18: qty 8

## 2021-01-18 MED ORDER — GABAPENTIN 300 MG PO CAPS
600.0000 mg | ORAL_CAPSULE | Freq: Two times a day (BID) | ORAL | Status: DC
  Administered 2021-01-18 – 2021-01-19 (×2): 600 mg via ORAL
  Filled 2021-01-18 (×2): qty 2

## 2021-01-18 MED ORDER — ACETAMINOPHEN 325 MG PO TABS
650.0000 mg | ORAL_TABLET | ORAL | Status: DC | PRN
  Administered 2021-01-18 – 2021-01-19 (×2): 650 mg via ORAL
  Filled 2021-01-18 (×3): qty 2

## 2021-01-18 MED ORDER — SODIUM CHLORIDE 0.9% FLUSH: 3.0000 mL | INTRAVENOUS | Status: DC | PRN

## 2021-01-18 MED ORDER — LIDOCAINE HCL (CARDIAC) PF 100 MG/5ML IV SOSY
PREFILLED_SYRINGE | INTRAVENOUS | Status: DC | PRN
  Administered 2021-01-18: 100 mg via INTRAVENOUS

## 2021-01-18 MED ORDER — INSULIN ASPART 100 UNIT/ML ~~LOC~~ SOLN
0.0000 [IU] | Freq: Three times a day (TID) | SUBCUTANEOUS | Status: DC
  Administered 2021-01-18: 4 [IU] via SUBCUTANEOUS
  Administered 2021-01-19 (×2): 3 [IU] via SUBCUTANEOUS

## 2021-01-18 MED ORDER — ALBUTEROL SULFATE HFA 108 (90 BASE) MCG/ACT IN AERS: 2.0000 | INHALATION_SPRAY | RESPIRATORY_TRACT | Status: DC | PRN

## 2021-01-18 MED ORDER — HYDRALAZINE HCL 20 MG/ML IJ SOLN
10.0000 mg | Freq: Once | INTRAMUSCULAR | Status: AC
  Administered 2021-01-18: 10 mg via INTRAVENOUS

## 2021-01-18 MED ORDER — AMIODARONE HCL 200 MG PO TABS
200.0000 mg | ORAL_TABLET | Freq: Every day | ORAL | Status: DC
  Administered 2021-01-18 – 2021-01-19 (×2): 200 mg via ORAL
  Filled 2021-01-18 (×2): qty 1

## 2021-01-18 MED ORDER — PROPOFOL 10 MG/ML IV BOLUS
INTRAVENOUS | Status: DC | PRN
  Administered 2021-01-18: 100 mg via INTRAVENOUS

## 2021-01-18 MED ORDER — HYDROCODONE-ACETAMINOPHEN 10-325 MG PO TABS
1.0000 | ORAL_TABLET | Freq: Four times a day (QID) | ORAL | Status: DC | PRN
Start: 2021-01-18 — End: 2021-01-19
  Administered 2021-01-18: 1 via ORAL
  Filled 2021-01-18: qty 1

## 2021-01-18 MED ORDER — HYDRALAZINE HCL 20 MG/ML IJ SOLN
INTRAMUSCULAR | Status: AC
  Filled 2021-01-18: qty 1

## 2021-01-18 MED ORDER — FUROSEMIDE 10 MG/ML IJ SOLN
60.0000 mg | Freq: Two times a day (BID) | INTRAMUSCULAR | Status: DC
  Administered 2021-01-18: 60 mg via INTRAVENOUS
  Filled 2021-01-18: qty 6

## 2021-01-18 MED ORDER — LATANOPROST 0.005 % OP SOLN
1.0000 [drp] | Freq: Every day | OPHTHALMIC | Status: DC
  Administered 2021-01-18: 1 [drp] via OPHTHALMIC
  Filled 2021-01-18: qty 2.5

## 2021-01-18 MED ORDER — APIXABAN 5 MG PO TABS
5.0000 mg | ORAL_TABLET | Freq: Once | ORAL | Status: AC
  Administered 2021-01-18: 5 mg via ORAL
  Filled 2021-01-18: qty 1

## 2021-01-18 MED ORDER — SODIUM CHLORIDE 0.9% FLUSH
3.0000 mL | Freq: Two times a day (BID) | INTRAVENOUS | Status: DC
  Administered 2021-01-18 – 2021-01-19 (×3): 3 mL via INTRAVENOUS

## 2021-01-18 MED ORDER — SODIUM CHLORIDE 0.9 % IV SOLN: 250.0000 mL | INTRAVENOUS | Status: DC | PRN

## 2021-01-18 MED ORDER — HYDRALAZINE HCL 25 MG PO TABS
25.0000 mg | ORAL_TABLET | Freq: Three times a day (TID) | ORAL | Status: DC
  Administered 2021-01-18 – 2021-01-19 (×4): 25 mg via ORAL
  Filled 2021-01-18 (×5): qty 1

## 2021-01-18 MED ORDER — FUROSEMIDE 10 MG/ML IJ SOLN
60.0000 mg | Freq: Once | INTRAMUSCULAR | Status: AC
  Administered 2021-01-18: 60 mg via INTRAVENOUS

## 2021-01-18 MED ORDER — OXYCODONE HCL ER 10 MG PO T12A
10.0000 mg | EXTENDED_RELEASE_TABLET | Freq: Two times a day (BID) | ORAL | Status: DC
Start: 2021-01-18 — End: 2021-01-19
  Administered 2021-01-18 – 2021-01-19 (×2): 10 mg via ORAL
  Filled 2021-01-18 (×2): qty 1

## 2021-01-18 MED ORDER — ONDANSETRON HCL 4 MG/2ML IJ SOLN: 4.0000 mg | Freq: Four times a day (QID) | INTRAMUSCULAR | Status: DC | PRN

## 2021-01-18 MED ORDER — INSULIN ASPART 100 UNIT/ML ~~LOC~~ SOLN: 0.0000 [IU] | Freq: Every day | SUBCUTANEOUS | Status: DC

## 2021-01-18 MED ORDER — SODIUM CHLORIDE 0.9 % IV SOLN: INTRAVENOUS | Status: DC | PRN

## 2021-01-18 MED ORDER — PANTOPRAZOLE SODIUM 40 MG PO TBEC
40.0000 mg | DELAYED_RELEASE_TABLET | Freq: Two times a day (BID) | ORAL | Status: DC
  Administered 2021-01-18 – 2021-01-19 (×2): 40 mg via ORAL
  Filled 2021-01-18 (×2): qty 1

## 2021-01-18 MED ORDER — INSULIN ASPART 100 UNIT/ML ~~LOC~~ SOLN
6.0000 [IU] | Freq: Three times a day (TID) | SUBCUTANEOUS | Status: DC
  Administered 2021-01-18 – 2021-01-19 (×2): 6 [IU] via SUBCUTANEOUS

## 2021-01-18 MED ORDER — METOPROLOL TARTRATE 50 MG PO TABS
50.0000 mg | ORAL_TABLET | Freq: Two times a day (BID) | ORAL | Status: DC
  Administered 2021-01-18 – 2021-01-19 (×2): 50 mg via ORAL
  Filled 2021-01-18 (×2): qty 1

## 2021-01-18 MED ORDER — ROSUVASTATIN CALCIUM 20 MG PO TABS
20.0000 mg | ORAL_TABLET | Freq: Every day | ORAL | Status: DC
  Administered 2021-01-18 – 2021-01-19 (×2): 20 mg via ORAL
  Filled 2021-01-18 (×2): qty 1

## 2021-01-18 NOTE — Anesthesia Postprocedure Evaluation (Signed)
Anesthesia Post Note  Patient: Lawrence Rose  Procedure(s) Performed: CARDIOVERSION (N/A )     Patient location during evaluation: PACU Anesthesia Type: General Level of consciousness: awake and alert and oriented Pain management: pain level controlled Vital Signs Assessment: post-procedure vital signs reviewed and stable Respiratory status: spontaneous breathing, nonlabored ventilation and respiratory function stable Cardiovascular status: blood pressure returned to baseline Postop Assessment: no apparent nausea or vomiting Anesthetic complications: no   No complications documented.  Last Vitals:  Vitals:   01/18/21 0828 01/18/21 0938  BP: (!) 203/114 (!) 173/86  Pulse: 94 76  Resp: 17 20  Temp: 37 C 36.5 C  SpO2: 94% 94%    Last Pain:  Vitals:   01/18/21 0938  TempSrc:   PainSc: 0-No pain                 [name]

## 2021-01-18 NOTE — H&P (Signed)
Cardiology Admission History and Physical:   Patient ID: Lawrence Rose MRN: [id number]; DOB: [birth year]   Admission date: 01/18/2021  PCP:  Doreatha Lew, MD   [place]  Cardiologist:  Werner Lean, MD  Advanced Practice Provider:  No care team member to display Electrophysiologist:  None    { Click here to update Patient Care Team and Refresh Note - MD (PCP) or APP (Team Member)  Change PCP Type for MD, Specialty for APP is either Cardiology or Clinical Cardiac Electrophysiology  :[id number]}    Chief Complaint:  DOE and Sob peri-cardiversion  Patient Profile:   Lawrence Rose is a 65 y.o. male with HTN, HLD, Morbid Obesity, OSA on CPAP, CKD Stage IIIa, Atrial flutter on eliquis with recent GI bleed initially since inpatient with severe aortic stenosis s/p 09/01/20 TAVR. Found to be in AFL and planned for cardioversion.  Post successful cardioversion he agrees  that his abdominal swelling and LE are significantly worse  History of Present Illness:   Lawrence Rose  notes that he is feeling less palpitations but still feels short of breath.  Has had no chest pain, chest pressure, chest tightness, chest stinging.  Discomfort occurs with as shortness of breath with rest, worsens with activity, and improves with rest.  Patient exertion notable for some ADLs with using a wheelchair with modest exertion and feels worse; his younger brother Lawrence Rose notes that he is far less active.   Notes PND or orthopnea.  Notes bendopnea, weight gain, leg swelling , and abdominal swelling.  No syncope or near syncope . Notes nocturnal palpitations that brought him to get DCCV.  Has his eliquis due at 11 am and has missed no doses.  No bleeding issues.   Notes that his BP is never this high.   Past Medical History:  Diagnosis Date  . Acid reflux   . Allergic arthritis, hand    per patient, both hands  . Anemia   . Apnea   . Asthma   . Carpal tunnel syndrome     per patient, both hands  . Chronic back pain   . Chronic kidney disease   . Chronic knee pain   . Diabetes mellitus without complication ([place])   . Dysrhythmia   . GI bleeding 09/2020  . Hypertension   . 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 Angelena Form and Dr. Cyndia Bent.   . Severe aortic stenosis   . Sleep apnea    wears CPAP every night    Past Surgical History:  Procedure Laterality Date  . BACK SURGERY    . ESOPHAGOGASTRODUODENOSCOPY (EGD) WITH PROPOFOL N/A 10/01/2020   Procedure: ESOPHAGOGASTRODUODENOSCOPY (EGD) WITH PROPOFOL;  Surgeon: [name], MD;  Location: [facility];  Service: Endoscopy;  Laterality: N/A;  . RIGHT/LEFT HEART CATH AND CORONARY ANGIOGRAPHY N/A 08/18/2020   Procedure: RIGHT/LEFT HEART CATH AND CORONARY ANGIOGRAPHY;  Surgeon: [name], MD;  Location: [place] [facility];  Service: Cardiovascular;  Laterality: N/A;  . TEE WITHOUT CARDIOVERSION N/A 08/21/2020   Procedure: TRANSESOPHAGEAL ECHOCARDIOGRAM (TEE);  Surgeon: [name], MD;  Location: [facility] ENDOSCOPY;  Service: Cardiovascular;  Laterality: N/A;  . TEE WITHOUT CARDIOVERSION N/A 09/01/2020   Procedure: TRANSESOPHAGEAL ECHOCARDIOGRAM (TEE);  Surgeon: [name], MD;  Location: [place] CV LAB;  Service: Open Heart Surgery;  Laterality: N/A;  . TRANSCATHETER AORTIC VALVE REPLACEMENT, TRANSFEMORAL  N/A 09/01/2020   Procedure: TRANSCATHETER AORTIC VALVE REPLACEMENT, TRANSFEMORAL;  Surgeon: [name], MD;  Location: [place] CV LAB;  Service: Open Heart Surgery;  Laterality: N/A;     Medications Prior to Admission: Prior to Admission medications   Medication Sig Start Date End Date Taking? Authorizing Provider  albuterol (VENTOLIN HFA) 108 (90 Base) MCG/ACT inhaler Inhale 2 puffs into the lungs every 4 (four) hours as needed for wheezing or shortness of breath.   08/22/16  Yes [provider]  amiodarone (PACERONE) 200 MG tablet Take 1 tablet (200 mg total) by mouth daily. 10/14/20  Yes Eileen Stanford, PA-C  apixaban (ELIQUIS) 5 MG TABS tablet Take 1 tablet (5 mg total) by mouth 2 (two) times daily. 11/24/20  Yes Eileen Stanford, PA-C  Blood Glucose Monitoring Suppl (FIFTY50 GLUCOSE METER 2.0) w/Device KIT See admin instructions. 05/16/19  Yes [provider]  Cholecalciferol 25 MCG (1000 UT) tablet Take 1,000 Units by mouth at bedtime.    Yes [provider]  diltiazem (CARDIZEM CD) 240 MG 24 hr capsule Take 240 mg by mouth daily. 09/14/20  Yes [provider]  gabapentin (NEURONTIN) 300 MG capsule Take 600 mg by mouth 2 (two) times daily.  08/12/20  Yes [provider]  HYDROcodone-acetaminophen (NORCO) 10-325 MG tablet Take 1 tablet by mouth every 6 (six) hours as needed for moderate pain.  08/04/20  Yes [provider]  Iron-Vitamins (GERITOL PO) Take 1 tablet by mouth daily.   Yes [provider]  latanoprost (XALATAN) 0.005 % ophthalmic solution Place 1 drop into both eyes at bedtime.  07/27/20  Yes [provider]  metFORMIN (GLUCOPHAGE) 1000 MG tablet Take 1,000 mg by mouth 2 (two) times daily. 07/10/20  Yes [provider]  metoprolol tartrate (LOPRESSOR) 50 MG tablet Take 1 tablet (50 mg total) by mouth 2 (two) times daily. 10/14/20  Yes Eileen Stanford, PA-C  Multiple Vitamin (MULTIVITAMIN WITH MINERALS) TABS tablet Take 1 tablet by mouth at bedtime.    Yes [provider]  oxyCODONE ER (XTAMPZA ER) 9 MG C12A Take 9 mg by mouth every 12 (twelve) hours.   Yes [provider]  pantoprazole (PROTONIX) 40 MG tablet Take 40 mg by mouth 2 (two) times daily. 05/26/20  Yes [provider]  Napavine into the lungs at bedtime. CPAP   Yes [provider]  rosuvastatin (CRESTOR) 20 MG tablet Take 20 mg by mouth  daily.  05/25/20  Yes [provider]  amoxicillin (AMOXIL) 500 MG capsule Take 2000 mg at least 1 hour prior to any dental procedure. 10/30/20   Werner Lean, MD  neomycin-bacitracin-polymyxin (NEOSPORIN) [phone] ointment Apply 1 application topically 2 (two) times daily as needed (itching).    [provider]     Allergies:    Allergies  Allergen Reactions  . Cocoa Hives  . Sulfamethoxazole-Trimethoprim Anaphylaxis  . Chocolate Hives  . Citrus Hives    Reaction to oranges and orange juice  . Fish Allergy Hives and Swelling    Throat swelling  . Shellfish Allergy Hives and Swelling    Throat swelling  . Sulfa Antibiotics Hives and Swelling    Throat swelling  . Tetracycline Swelling    Social History:   Social History   Socioeconomic History  . Marital status: Single    Spouse name: Not on file  . Number of children: Not on file  . Years of education: Not on file  .  Highest education level: Not on file  Occupational History  . Not on file  Tobacco Use  . Smoking status: Former Research scientist (life sciences)  . Smokeless tobacco: Never Used  . Tobacco comment: per patient quit over 30 years ago  Vaping Use  . Vaping Use: Never used  Substance and Sexual Activity  . Alcohol use: No  . Drug use: Not Currently  . Sexual activity: Not on file  Other Topics Concern  . Not on file  Social History Narrative  . Not on file   Social Determinants of Health   Financial Resource Strain: Not on file  Food Insecurity: Not on file  Transportation Needs: Not on file  Physical Activity: Not on file  Stress: Not on file  Social Connections: Not on file  Intimate Partner Violence: Not on file    Family History:   No HFrEF  ROS:  Please see the history of present illness.  All other ROS reviewed and negative.     Physical Exam/Data:   Vitals:   01/18/21 0828 01/18/21 0938 01/18/21 0940 01/18/21 0950  BP: (!) 203/114 (!) 173/86 (!) 177/88 (!) 171/87  Pulse: 94 76  73 71  Resp: _0 Temp: 98.6 F (37 C) 97.7 F (36.5 C)    TempSrc: Oral     SpO2: 94% 94% 96% 100%  Weight: (!) 140.6 kg     Height: _1  (1.803 m)      No intake or output data in the 24 hours ending 01/18/21 1007 Last 3 Weights 01/18/2021 12/24/2020 11/25/2020  Weight (lbs) 310 lb 311 lb 311 lb 12.8 oz  Weight (kg) 140.615 kg 141.069 kg 141.432 kg     Body mass index is 43.24 kg/m.  General:  Morbidly Obese Male in Mild Distress HEENT: normal poor dentition Lymph: no adenopathy Neck: no JVD but thick neck Endocrine:  No thryomegaly Vascular: No carotid bruits; FA pulses 2+ bilaterally without bruits  Cardiac:  normal S1, S2; RRR; no murmur Lungs:  clear to auscultation bilaterally, no wheezing, rhonchi or rales  Abd: soft, nontender, dullness to percussion Ext: +2 edema skin is warm Musculoskeletal:  No deformities, BUE and BLE strength normal and equal Skin: warm and wet Neuro:  CNs 2-12 intact, no focal abnormalities noted Psych:  Normal affect   EKG:  The ECG that was done  was personally reviewed and demonstrates successful cardioversion  Laboratory Data:  High Sensitivity Troponin:  No results for input(s): TROPONINIHS in the last 720 hours.    Chemistry Recent Labs  Lab 01/18/21 0908  NA 142  K 4.3  CL 102  GLUCOSE 114*  BUN 28*  CREATININE 1.50*    No results for input(s): PROT, ALBUMIN, AST, ALT, ALKPHOS, BILITOT in the last 168 hours. Hematology Recent Labs  Lab 01/18/21 0908  HGB 11.9*  HCT 35.0*   BNPNo results for input(s): BNP, PROBNP in the last 168 hours.  DDimer No results for input(s): DDIMER in the last 168 hours.   Radiology/Studies:  No results found.   Assessment and Plan:   Morbid Obesity HF Phenotype CKD Stage III a Severe AS s/p TAVR AFL s/p successful cardioversion HTN and DM - Needs IV diuresis 60 IV lasix BID and BNP, getting BMP and Mg - Starting hydralazine and home medications, needs BP control - hold  metformin and getting DM coordinator and sliding scale - continue Amio, Eliquis, and metoprolol but hold diltiazem - getting echo - Chronic pain- continue home meds -  non obstructive CAD continue ASA  Full CODE DOAC for DVT Prop Cardiac Carb Consistent Diet Labs ordered from AM Admission orders signed and held  Discussed with Patient and Brother  Risk Assessment/Risk Scores:        New York Heart Association (NYHA) Functional Class NYHA Class IV  CHA2DS2-VASc Score = 4  This indicates a 4.8% annual risk of stroke. The patient's score is based upon: CHF History: No HTN History: Yes Diabetes History: Yes Stroke History: No Vascular Disease History: Yes Age Score: 1 Gender Score: 0      Severity of Illness: The appropriate patient status for this patient is INPATIENT. Inpatient status is judged to be reasonable and necessary in order to provide the required intensity of service to ensure the patient's safety. The patient's presenting symptoms, physical exam findings, and initial radiographic and laboratory data in the context of their chronic comorbidities is felt to place them at high risk for further clinical deterioration. Furthermore, it is not anticipated that the patient will be medically stable for discharge from the hospital within 2 midnights of admission. The following factors support the patient status of inpatient.   " The patient's presenting symptoms include SOB and DOE. " The worrisome physical exam findings include abdominal and leg swelling. " The initial radiographic and laboratory data are worrisome because of pending BNO. " The chronic co-morbidities include Atrial Flutter Severe AS CKD Stage IIIa, .   * I certify that at the point of admission it is my clinical judgment that the patient will require inpatient hospital care spanning beyond 2 midnights from the point of admission due to high intensity of service, high risk for further deterioration and high  frequency of surveillance required.*    For questions or updates, please contact [place] Please consult [email or web address] for contact info under     Signed, Werner Lean, MD  01/18/2021 10:07 AM

## 2021-01-18 NOTE — Interval H&P Note (Signed)
History and Physical Interval Note:  01/18/2021 7:59 AM  Lawrence Rose  has presented today for surgery, with the diagnosis of ATRIAL FLUTTER.  The various methods of treatment have been discussed with the patient and family. After consideration of risks, benefits and other options for treatment, the patient has consented to  Procedure(s): CARDIOVERSION (N/A) as a surgical intervention.  The patient's history has been reviewed, patient examined, no change in status, stable for surgery.  I have reviewed the patient's chart and labs.  Questions were answered to the patient's satisfaction.     Lawrence Rose A Keith Cancio

## 2021-01-18 NOTE — Transfer of Care (Signed)
Immediate Anesthesia Transfer of Care Note  Patient: Lawrence Rose  Procedure(s) Performed: CARDIOVERSION (N/A )  Patient Location: Endoscopy Unit  Anesthesia Type:MAC  Level of Consciousness: awake, drowsy and patient cooperative  Airway & Oxygen Therapy: Patient Spontanous Breathing and Patient connected to nasal cannula oxygen  Post-op Assessment: Report given to RN, Post -op Vital signs reviewed and stable and Patient moving all extremities X 4  Post vital signs: Reviewed and stable  Last Vitals:  Vitals Value Taken Time  BP    Temp    Pulse    Resp    SpO2      Last Pain:  Vitals:   01/18/21 0828  TempSrc: Oral  PainSc: 5          Complications: No complications documented.

## 2021-01-18 NOTE — CV Procedure (Signed)
   Electrical Cardioversion Procedure Note Lawrence Rose [id number] 19-Apr-1956  Procedure: Electrical Cardioversion Indications:  Atrial Flutter  Time Out: Verified patient identification, verified procedure,medications/allergies/relevent history reviewed, required imaging and test results available.  Performed  Procedure Details  The patient was NPO after midnight. Anesthesia was administered at the beside  by Dr.Howze with 50 mg of lidocaine and 60 mg propofol.  Cardioversion was done with synchronized biphasic defibrillation with AP pads with 200 Joules.  The patient converted to normal sinus rhythm. The patient tolerated the procedure well   IMPRESSION:  Successful cardioversion of atrial flutter    Lawrence Rose A Dmitriy Gair 01/18/2021, 9:33 AM   In discussion with patient he notes that he has actually gained some fluid weight, with leg swelling and abdominal swelling.  Would recommend admission for diuresis.

## 2021-01-18 NOTE — Anesthesia Preprocedure Evaluation (Addendum)
Anesthesia Evaluation  Patient identified by MRN, date of birth, ID band Patient awake    Reviewed: Allergy & Precautions, NPO status , Patient's Chart, lab work & pertinent test results  History of Anesthesia Complications Negative for: history of anesthetic complications  Airway Mallampati: III  TM Distance: >3 FB Neck ROM: Full    Dental  (+) Poor Dentition, Missing,    Pulmonary asthma , sleep apnea , former smoker,    Pulmonary exam normal        Cardiovascular hypertension, Pt. on home beta blockers and Pt. on medications Normal cardiovascular exam+ dysrhythmias Atrial Fibrillation   TTE 12/21: EF 55-60%, s/p AVR    Neuro/Psych negative neurological ROS  negative psych ROS   GI/Hepatic Neg liver ROS, GERD  Medicated and Controlled,  Endo/Other  diabetes, Type obesity  Renal/GU Renal InsufficiencyRenal disease  negative genitourinary   Musculoskeletal  (+) Arthritis ,   Abdominal   Peds  Hematology   Anesthesia Other Findings Day of surgery medications reviewed with patient.  Reproductive/Obstetrics negative OB ROS                           Anesthesia Physical Anesthesia Plan  ASA: III  Anesthesia Plan: General   Post-op Pain Management:    Induction: Intravenous  PONV Risk Score and Plan: Treatment may vary due to age or medical condition and Propofol infusion  Airway Management Planned: Mask  Additional Equipment: None  Intra-op Plan:   Post-operative Plan:   Informed Consent: I have reviewed the patients History and Physical, chart, labs and discussed the procedure including the risks, benefits and alternatives for the proposed anesthesia with the patient or authorized representative who has indicated his/her understanding and acceptance.       Plan Discussed with: CRNA  Anesthesia Plan Comments:        Anesthesia Quick Evaluation

## 2021-01-18 NOTE — Progress Notes (Addendum)
Inpatient Diabetes Program Recommendations  AACE/ADA: New Consensus Statement on Inpatient Glycemic Control (2015)  Target Ranges:  Prepandial:   less than 140 mg/dL      Peak postprandial:   less than 180 mg/dL (1-2 hours)      Critically ill patients:  140 - 180 mg/dL   Results for ELOISE, MULA (MRN [id number]) as of 01/18/2021 10:33  Ref. Range 01/18/2021 09:08  Glucose Latest Ref Range: 70 - 99 mg/dL 921 (H)   Results for KREGG, CIHLAR (MRN [id number]) as of 01/18/2021 10:33  Ref. Range 08/27/2020 16:00  Hemoglobin A1C Latest Ref Range: 4.8 - 5.6 % 6.1 (H)    Admit with: Electrical Cardioversion for Atrial Flutter  History: DM, CKD  Home DM Meds: Metformin 1000 mg BID  Current Orders: Novolog Resistant Correction Scale/ SSI (0-20 units) TID AC + HS     Novolog 6 units TID with meals    Note Novolog SSI and Meal Coverage to both start at 12pm today  Current A1c pending  Agree with current orders     --Will follow patient during hospitalization--  Ambrose Finland RN, MSN, CDE Diabetes Coordinator Inpatient Glycemic Control Team Team Pager: [phone] (8a-5p)

## 2021-01-18 NOTE — Progress Notes (Signed)
Pt placed on CPAP pt tolerating well full face mask with 3 lpm bled in.

## 2021-01-19 ENCOUNTER — Inpatient Hospital Stay (HOSPITAL_BASED_OUTPATIENT_CLINIC_OR_DEPARTMENT_OTHER): Payer: Medicare HMO

## 2021-01-19 ENCOUNTER — Other Ambulatory Visit (HOSPITAL_COMMUNITY): Payer: Self-pay

## 2021-01-19 DIAGNOSIS — I361 Nonrheumatic tricuspid (valve) insufficiency: Secondary | ICD-10-CM

## 2021-01-19 DIAGNOSIS — I4891 Unspecified atrial fibrillation: Secondary | ICD-10-CM | POA: Diagnosis not present

## 2021-01-19 DIAGNOSIS — I5031 Acute diastolic (congestive) heart failure: Secondary | ICD-10-CM

## 2021-01-19 DIAGNOSIS — I5030 Unspecified diastolic (congestive) heart failure: Secondary | ICD-10-CM

## 2021-01-19 DIAGNOSIS — I160 Hypertensive urgency: Secondary | ICD-10-CM

## 2021-01-19 DIAGNOSIS — I4892 Unspecified atrial flutter: Secondary | ICD-10-CM

## 2021-01-19 DIAGNOSIS — I480 Paroxysmal atrial fibrillation: Secondary | ICD-10-CM

## 2021-01-19 DIAGNOSIS — I5020 Unspecified systolic (congestive) heart failure: Secondary | ICD-10-CM | POA: Diagnosis present

## 2021-01-19 LAB — GLUCOSE, CAPILLARY
Glucose-Capillary: 140 mg/dL — ABNORMAL HIGH (ref 70–99)
Glucose-Capillary: 141 mg/dL — ABNORMAL HIGH (ref 70–99)

## 2021-01-19 LAB — ECHOCARDIOGRAM COMPLETE
AR max vel: 1.78 cm2
AV Area VTI: 1.83 cm2
AV Area mean vel: 2.36 cm2
AV Mean grad: 13 mmHg
AV Peak grad: 24.2 mmHg
Ao pk vel: 2.46 m/s
Area-P 1/2: 4.49 cm2
Calc EF: 47.3 %
Height: 71 in
MV M vel: 4.39 m/s
MV Peak grad: 77.1 mmHg
S' Lateral: 2.7 cm
Single Plane A2C EF: 59.7 %
Single Plane A4C EF: 29.9 %
Weight: 4776 oz

## 2021-01-19 LAB — BASIC METABOLIC PANEL
Anion gap: 9 (ref 5–15)
BUN: 26 mg/dL — ABNORMAL HIGH (ref 8–23)
CO2: 34 mmol/L — ABNORMAL HIGH (ref 22–32)
Calcium: 9.1 mg/dL (ref 8.9–10.3)
Chloride: 97 mmol/L — ABNORMAL LOW (ref 98–111)
Creatinine, Ser: 1.69 mg/dL — ABNORMAL HIGH (ref 0.61–1.24)
GFR, Estimated: 44 mL/min — ABNORMAL LOW (ref >60)
Glucose, Bld: 101 mg/dL — ABNORMAL HIGH (ref 70–99)
Potassium: 4 mmol/L (ref 3.5–5.1)
Sodium: 140 mmol/L (ref 135–145)

## 2021-01-19 LAB — MAGNESIUM: Magnesium: 2 mg/dL (ref 1.7–2.4)

## 2021-01-19 MED ORDER — HYDRALAZINE HCL 25 MG PO TABS: 25.0000 mg | ORAL_TABLET | Freq: Three times a day (TID) | ORAL | 2 refills | Status: DC

## 2021-01-19 MED ORDER — APIXABAN 5 MG PO TABS: 5.0000 mg | ORAL_TABLET | Freq: Two times a day (BID) | ORAL | Status: DC

## 2021-01-19 MED ORDER — FUROSEMIDE 40 MG PO TABS: 40.0000 mg | ORAL_TABLET | Freq: Every day | ORAL | 2 refills | Status: DC

## 2021-01-19 MED ORDER — FUROSEMIDE 40 MG PO TABS
40.0000 mg | ORAL_TABLET | Freq: Every day | ORAL | Status: DC
  Administered 2021-01-19: 40 mg via ORAL
  Filled 2021-01-19: qty 1

## 2021-01-19 MED ORDER — APIXABAN 5 MG PO TABS
5.0000 mg | ORAL_TABLET | Freq: Two times a day (BID) | ORAL | Status: DC
  Administered 2021-01-19: 5 mg via ORAL

## 2021-01-19 NOTE — TOC Transition Note (Addendum)
Transition of Care [facility]) - CM/SW Discharge Note   Patient Details  Name: Lawrence Rose MRN: [id number] Date of Birth: November 30, 1955  [facility]) CM/SW Contact:  Leone Haven, RN Phone Number: 01/19/2021, 12:59 PM   Clinical Narrative:    Patient is for dc today, his brother will transport him home when he gets off work around 4:30 this evening.  He states he does not need any HH services.  Final next level of care: Home/Self Care Barriers to Discharge: No Barriers Identified   Patient Goals and CMS Choice Patient states their goals for this hospitalization and ongoing recovery are:: return home   Choice offered to / list presented to : NA  Discharge Placement                       Discharge Plan and Services In-house Referral: NA Discharge Planning Services: CM Consult Post Acute Care Choice: NA            DME Agency: NA       HH Arranged: NA          Social Determinants of Health (SDOH) Interventions     Readmission Risk Interventions Readmission Risk Prevention Plan 01/19/2021  Transportation Screening Complete  PCP or Specialist Appt within 3-5 Days Complete  HRI or Home Care Consult Complete  Social Work Consult for Recovery Care Planning/Counseling Complete  Palliative Care Screening Not Applicable  Medication Review Oceanographer) Complete  Some recent data might be hidden

## 2021-01-19 NOTE — Care Management CC44 (Signed)
Condition Code 44 Documentation Completed  Patient Details  Name: Lawrence Rose MRN: [id number] Date of Birth: [birth year]   Condition Code 44 given:  Yes Patient signature on Condition Code 44 notice:  Yes Documentation of 2 MD's agreement:  Yes Code 44 added to claim:  Yes    Leone Haven, RN 01/19/2021, 1:41 PM

## 2021-01-19 NOTE — Progress Notes (Signed)
D/C instructions given and reviewed. Tele and IV removed, tolerated well. Brother at bedside to transport home.

## 2021-01-19 NOTE — TOC Initial Note (Signed)
Transition of Care [facility]) - Initial/Assessment Note    Patient Details  Name: Lawrence Rose MRN: [id number] Date of Birth: [birth year]  [facility]) CM/SW Contact:    Leone Haven, RN Phone Number: 01/19/2021, 12:56 PM  Clinical Narrative:                 NCM spoke with patient, he lives alone, he has a walker and cane at home, he has PCP.  He states his brother will transport him home at dc when he gets off work around 4:30, this NCM offered to get transport for him, he states he needs to wait for his brother because he helps him to get in the house.  He states he has a scale and bp cuff at home.  He tries to eat a low sodium diet, he likes a lot of fruit and veges.    Expected Discharge Plan: Home/Self Care Barriers to Discharge: No Barriers Identified   Patient Goals and CMS Choice Patient states their goals for this hospitalization and ongoing recovery are:: return home   Choice offered to / list presented to : NA  Expected Discharge Plan and Services Expected Discharge Plan: Home/Self Care In-house Referral: NA Discharge Planning Services: CM Consult Post Acute Care Choice: NA Living arrangements for the past 2 months: Single Family Home Expected Discharge Date: 01/19/21                 DME Agency: NA       HH Arranged: NA          Prior Living Arrangements/Services Living arrangements for the past 2 months: Single Family Home Lives with:: Self Patient language and need for interpreter reviewed:: Yes Do you feel safe going back to the place where you live?: Yes      Need for Family Participation in Patient Care: No (Comment) Care giver support system in place?: No (comment) Current home services: DME (walker and cane) Criminal Activity/Legal Involvement Pertinent to Current Situation/Hospitalization: No - Comment as needed  Activities of Daily Living Home Assistive Devices/Equipment: Walker (specify type),Wheelchair,Cane (specify quad or  straight) ADL Screening (condition at time of admission) Patient's cognitive ability adequate to safely complete daily activities?: Yes Is the patient deaf or have difficulty hearing?: No Does the patient have difficulty seeing, even when wearing glasses/contacts?: No Does the patient have difficulty concentrating, remembering, or making decisions?: No Patient able to express need for assistance with ADLs?: Yes Does the patient have difficulty dressing or bathing?: No Independently performs ADLs?: Yes (appropriate for developmental age) Does the patient have difficulty walking or climbing stairs?: Yes Weakness of Legs: Both Weakness of Arms/Hands: None  Permission Sought/Granted                  Emotional Assessment   Attitude/Demeanor/Rapport: Engaged Affect (typically observed): Appropriate Orientation: : Oriented to Self,Oriented to Place,Oriented to  Time,Oriented to Situation Alcohol / Substance Use: Not Applicable Psych Involvement: No (comment)  Admission diagnosis:  HFrEF (heart failure with reduced ejection fraction) (HCC) [I50.20] Patient Active Problem List   Diagnosis Date Noted  . Atrial fibrillation status post cardioversion (HCC) 01/19/2021  . (HFpEF) heart failure with preserved ejection fraction (HCC) 01/19/2021  . Asymptomatic hypertensive urgency 01/19/2021  . Therapeutic drug monitoring 12/24/2020  . Hypertension associated with diabetes (HCC) 12/24/2020  . Morbid obesity (HCC) 10/29/2020  . Occult blood positive stool   . GI bleed 09/30/2020  . Chronic anticoagulation 09/30/2020  . Atrial flutter (HCC)  09/30/2020  . Diabetes mellitus type 2 in obese (HCC) 09/30/2020  . Anemia 09/01/2020  . Pulmonary hypertension, unspecified (HCC) 09/01/2020  . 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 Severe aortic stenosis   . Nonrheumatic mitral valve stenosis   . CKD (chronic kidney disease) stage 3, GFR 30-59 ml/min (HCC) 08/17/2020  .  OSA on CPAP 08/17/2020  . Obesity, Class III, BMI 40-49.9 (morbid obesity) (HCC) 08/17/2020   PCP:  Lenox Ponds, MD Pharmacy:   [facility] [address] [place], [place] - [address] [place] [address] Phone: [phone] Fax: [phone]     Social Determinants of Health (SDOH) Interventions    Readmission Risk Interventions Readmission Risk Prevention Plan 01/19/2021  Transportation Screening Complete  PCP or Specialist Appt within 3-5 Days Complete  HRI or Home Care Consult Complete  Social Work Consult for Recovery Care Planning/Counseling Complete  Palliative Care Screening Not Applicable  Medication Review Oceanographer) Complete  Some recent data might be hidden

## 2021-01-19 NOTE — Discharge Summary (Signed)
Discharge Summary    Patient ID: Lawrence Rose MRN: [id number]; DOB: [birth year]  Admit date: 01/18/2021 Discharge date: 01/19/2021  PCP:  [name], [name], MD   [place]  Cardiologist:  Werner Lean, MD    Discharge Diagnoses    Principal Problem:   (HFpEF) heart failure with preserved ejection fraction [facility]) Active Problems:   CKD (chronic kidney disease) stage 3, GFR 30-59 ml/min (HCC)   Severe aortic stenosis   S/P TAVR (transcatheter aortic valve replacement)   Atrial flutter ([place])   Atrial fibrillation status post cardioversion [facility])   Asymptomatic hypertensive urgency  Diagnostic Studies/Procedures    DCCV 01/18/21:  Procedure: Electrical Cardioversion Indications:  Atrial Flutter  Time Out: Verified patient identification, verified procedure,medications/allergies/relevent history reviewed, required imaging and test results available.  Performed  Procedure Details  The patient was NPO after midnight. Anesthesia was administered at the beside  by Dr.Howze with 50 mg of lidocaine and 60 mg propofol.  Cardioversion was done with synchronized biphasic defibrillation with AP pads with 200 Joules.  The patient converted to normal sinus rhythm. The patient tolerated the procedure well   IMPRESSION:  Successful cardioversion of atrial flutter   History of Present Illness     Lawrence Rose is a 65 y.o. male with a hx of persistent atrial fibrillation (occurring after TAVR), recurrent GI bleed (on ASA and Eliquis however has been stable on Eliquis alone), severe AS s/p TAVR 08/2020, HTN, HLD, OSA on CPAP, CKD stage III and DM2.     He underwent TAVR for severe AS 08/2020 and unfortunately postoperatively developed atrial flutter and was placed on apixaban and amiodarone at that time. He was transitioned from aspirin and clopidogrel to apixaban when he experienced gastrointestinal bleeding about 5 days after making the transition. He  was noted to have erosive gastritis but no obvious bleeding source on upper endoscopy. He was referred to Dr. Burt Knack for the evaluation of Watchman however did not meet qualifications with the plan for follow up DCCV.   Hospital Course    This was performed as an OP procedure on 01/18/21 with 200Jx1 shock to NSR. The patient tolerated the procedure well however was felt to be fluid volume overloaded with weight gain and leg and abdominal swelling. Given this, he was admitted to [facility] overnight for diuresis.   He was given one dose of IV Lasix 90m with good UO and transitioned to PO dosing Lasix 436mQD. All other medications will remain the same. Labs on discharge day show a mildly elevated Cr at 1.69 with baseline in the 1.5 range., We will have this rechecked at follow up. CBC is stable with Hb ar 11.1. He is net negative 5.3L since admission yesterday. Weight is 298lb>>down from 310lb.   His BP was markedly elevated therefore he was started on Hydralazine 2533mID.   Consultants: None    The patient was seen and examined by Dr. O'NAudie Boxo feels that the patient is stable and ready for discharge today, 01/19/21.   Did the patient have an acute coronary syndrome (MI, NSTEMI, STEMI, etc) this admission?:  No                               Did the patient have a percutaneous coronary intervention (stent / angioplasty)?:  No.     _____________  Discharge Vitals Blood pressure (!) 133/59, pulse 66, temperature 98.7 F (37.1 C), temperature  source Oral, resp. rate 20, height '5\' 11"'  (1.803 m), weight 135.4 kg, SpO2 98 %.  Filed Weights   01/18/21 0828 01/18/21 1426 01/19/21 0437  Weight: (!) 140.6 kg (!) 138.4 kg 135.4 kg    Labs & Radiologic Studies    CBC Recent Labs    01/18/21 0908 01/18/21 1453  WBC  --  7.1  NEUTROABS  --  5.1  HGB 11.9* 11.1*  HCT 35.0* 37.6*  MCV  --  87.2  PLT  --  403   Basic Metabolic Panel Recent Labs    01/18/21 1453 01/19/21 0357  NA 141 140  K 4.3  4.0  CL 100 97*  CO2 30 34*  GLUCOSE 100* 101*  BUN 24* 26*  CREATININE 1.53* 1.69*  CALCIUM 9.5 9.1  MG 2.1 2.0   Liver Function Tests No results for input(s): AST, ALT, ALKPHOS, BILITOT, PROT, ALBUMIN in the last 72 hours. No results for input(s): LIPASE, AMYLASE in the last 72 hours. High Sensitivity Troponin:   No results for input(s): TROPONINIHS in the last 720 hours.  BNP Invalid input(s): POCBNP D-Dimer No results for input(s): DDIMER in the last 72 hours. Hemoglobin A1C Recent Labs    01/18/21 1453  HGBA1C 5.6   Fasting Lipid Panel No results for input(s): CHOL, HDL, LDLCALC, TRIG, CHOLHDL, LDLDIRECT in the last 72 hours. Thyroid Function Tests No results for input(s): TSH, T4TOTAL, T3FREE, THYROIDAB in the last 72 hours.  Invalid input(s): FREET3 _____________  No results found. Disposition   Pt is being discharged home today in good condition.  Follow-up Plans & Appointments    Follow-up Information    Lendon Colonel, NP Follow up on 02/12/2021.   Specialties: Nurse Practitioner, Radiology, Cardiology Why: at 11:15am  Contact information: [address] [place] [address] [phone]              Discharge Instructions    Call MD for:  difficulty breathing, headache or visual disturbances   Complete by: As directed    Call MD for:  extreme fatigue   Complete by: As directed    Call MD for:  hives   Complete by: As directed    Call MD for:  persistant dizziness or light-headedness   Complete by: As directed    Call MD for:  persistant nausea and vomiting   Complete by: As directed    Call MD for:  redness, tenderness, or signs of infection (pain, swelling, redness, odor or green/yellow discharge around incision site)   Complete by: As directed    Call MD for:  severe uncontrolled pain   Complete by: As directed    Call MD for:  temperature >100.4   Complete by: As directed    Diet - low sodium heart healthy   Complete  by: As directed    Discharge instructions   Complete by: As directed    Please note that your follow up appointment will be at the [facility] office located in the [facility] due to availability. Please call if you would like directions to that office.   Increase activity slowly   Complete by: As directed      Discharge Medications   Allergies as of 01/19/2021      Reactions   Cocoa Hives   Sulfamethoxazole-trimethoprim Anaphylaxis   Chocolate Hives   Citrus Hives   Reaction to oranges and orange juice   Fish Allergy Hives, Swelling   Throat swelling   Shellfish  Allergy Hives, Swelling   Throat swelling   Sulfa Antibiotics Hives, Swelling   Throat swelling   Tetracycline Swelling      Medication List    TAKE these medications   albuterol 108 (90 Base) MCG/ACT inhaler Commonly known as: VENTOLIN HFA Inhale 2 puffs into the lungs every 4 (four) hours as needed for wheezing or shortness of breath.   amiodarone 200 MG tablet Commonly known as: Pacerone Take 1 tablet (200 mg total) by mouth daily.   amoxicillin 500 MG capsule Commonly known as: AMOXIL Take 2000 mg at least 1 hour prior to any dental procedure.   apixaban 5 MG Tabs tablet Commonly known as: Eliquis Take 1 tablet (5 mg total) by mouth 2 (two) times daily.   Cholecalciferol 25 MCG (1000 UT) tablet Take 1,000 Units by mouth at bedtime.   diltiazem 240 MG 24 hr capsule Commonly known as: CARDIZEM CD Take 240 mg by mouth daily.   Fifty50 Glucose Meter 2.0 w/Device Kit See admin instructions.   furosemide 40 MG tablet Commonly known as: LASIX Take 1 tablet (40 mg total) by mouth daily.   gabapentin 300 MG capsule Commonly known as: NEURONTIN Take 600 mg by mouth 2 (two) times daily.   GERITOL PO Take 1 tablet by mouth daily.   hydrALAZINE 25 MG tablet Commonly known as: APRESOLINE Take 1 tablet (25 mg total) by mouth every 8 (eight) hours.   HYDROcodone-acetaminophen 10-325 MG  tablet Commonly known as: NORCO Take 1 tablet by mouth every 6 (six) hours as needed for moderate pain.   latanoprost 0.005 % ophthalmic solution Commonly known as: XALATAN Place 1 drop into both eyes at bedtime.   metFORMIN 1000 MG tablet Commonly known as: GLUCOPHAGE Take 1,000 mg by mouth 2 (two) times daily.   metoprolol tartrate 50 MG tablet Commonly known as: LOPRESSOR Take 1 tablet (50 mg total) by mouth 2 (two) times daily.   multivitamin with minerals Tabs tablet Take 1 tablet by mouth at bedtime.   neomycin-bacitracin-polymyxin [phone] ointment Apply 1 application topically 2 (two) times daily as needed (itching).   pantoprazole 40 MG tablet Commonly known as: PROTONIX Take 40 mg by mouth 2 (two) times daily.   PRESCRIPTION MEDICATION Inhale into the lungs at bedtime. CPAP   rosuvastatin 20 MG tablet Commonly known as: CRESTOR Take 20 mg by mouth daily.   Xtampza ER 9 MG C12a Generic drug: oxyCODONE ER Take 9 mg by mouth every 12 (twelve) hours.        Outstanding Labs/Studies   CBC, BMET  Duration of Discharge Encounter   Greater than 30 minutes including physician time.  Signed, Kathyrn Drown, NP 01/19/2021, 10:16 AM

## 2021-01-19 NOTE — Plan of Care (Signed)

## 2021-01-19 NOTE — Care Management Obs Status (Signed)
MEDICARE OBSERVATION STATUS NOTIFICATION   Patient Details  Name: Lawrence Rose MRN: [id number] Date of Birth: 02-Nov-1955   Medicare Observation Status Notification Given:  Yes    Leone Haven, RN 01/19/2021, 1:41 PM

## 2021-01-20 ENCOUNTER — Encounter (HOSPITAL_COMMUNITY): Payer: Self-pay | Admitting: Internal Medicine

## 2021-01-20 ENCOUNTER — Other Ambulatory Visit: Payer: Self-pay | Admitting: *Deleted

## 2021-01-20 MED ORDER — FUROSEMIDE 40 MG PO TABS: 40.0000 mg | ORAL_TABLET | Freq: Every day | ORAL | 2 refills | Status: DC

## 2021-01-29 ENCOUNTER — Telehealth: Payer: Self-pay | Admitting: Internal Medicine

## 2021-01-29 DIAGNOSIS — E1159 Type 2 diabetes mellitus with other circulatory complications: Secondary | ICD-10-CM

## 2021-01-29 DIAGNOSIS — I350 Nonrheumatic aortic (valve) stenosis: Secondary | ICD-10-CM

## 2021-01-29 NOTE — Telephone Encounter (Signed)
Patient was in hospital his discharge creatinine level 1.69 normal baseline is 1.5 labs today level is now 1.81. would like to know if you would want to change the patient lasix dose. Please advise Chyna ([facility]) [phone]

## 2021-01-29 NOTE — Telephone Encounter (Signed)
Called patient to notify him of Dr. Izora Ribas recommendations.  He reports that he weighs himself daily but does not write them down.  He says he may have lost 1 pound last week.  He denies taking NSAIDS: no naproxen/ aleve or ibuprofen.  I advised him to write down weights and take them with him to FU OV with Joni Reining, NP on 02/12/21 at 11:15. He was unaware of appointment so I went over details with him.  I will place orders for fu labs to be drawn same day as 02/12/21 OV.  I sent NP and covering a message to remind patient to get labs drawn.

## 2021-02-11 NOTE — Progress Notes (Signed)
Cardiology Office Note   Date:  02/12/2021   ID:  Lawrence Rose, DOB 01-Sep-1956, MRN [id number]  PCP:  [name], [name], MD  Cardiologist:  Dr. [name]  No chief complaint on file.    History of Present Illness: Lawrence Rose is a 65 y.o. male who presents for posthospitalization follow-up after admission on 01/18/2021 in the setting of decompensated CHF, with preserved ejection fraction, with other history to include severe aortic valve stenosis status post TAVR on 09/05/2020 with postoperative, atrial flutter, atrial fibrillation status post cardioversion, asymptomatic hypertensive urgency, with other history to include chronic kidney disease stage III with a GFR between 30 and 59 mL/min, OSA on CPAP, and diabetes type 2.    It was noted that the patient was initially placed on apixaban but experienced GI bleeding for about 5 days after beginning this medication.  He was found to have erosive gastritis but no obviously bleeding source on an EGD.  He was also referred to Dr. Sherren Mocha for further evaluation of a watchman device however he did not meet qualifications with the plan for a follow-up DCCV.  During hospitalization the patient did have a DCCV on 01/18/2021 which was successful in returning into normal sinus rhythm.  However, it was felt the patient was volume overloaded with weight gain and leg and abdominal swelling, and therefore he was admitted for overnight diuresis.  He was treated with IV Lasix with good urine output and transition to p.o. Lasix 40 mg daily.  Weight on discharge was 298 pounds (down from 310 pounds) due to hypertension noted during hospitalization he was started on hydralazine 25 mg 3 times daily. Discharge blood pressure 133/69.   He comes today with complaints of frequent urination.  He states that he is afraid he is going to get dehydrated.  He mentioned that when he had been on daily Lasix that occurred and Lasix had to be stopped.  This eventually  led to volume overload.  He denies any issues with bleeding, hemoptysis, or dyspnea.  He is in a wheelchair for ambulation.  He is able to lie in his bed without significant trouble breathing.  He is medically compliant.  Past Medical History:  Diagnosis Date  . Acid reflux   . Allergic arthritis, hand    per patient, both hands  . Anemia   . Apnea   . Asthma   . Carpal tunnel syndrome    per patient, both hands  . Chronic back pain   . Chronic kidney disease   . Chronic knee pain   . Diabetes mellitus without complication ([place])   . Dysrhythmia   . GI bleeding 09/2020  . Hypertension   . 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 Angelena Form and Dr. Cyndia Bent.   . Severe aortic stenosis   . Sleep apnea    wears CPAP every night    Past Surgical History:  Procedure Laterality Date  . BACK SURGERY    . CARDIOVERSION  01/18/2021  . CARDIOVERSION N/A 01/18/2021   Procedure: CARDIOVERSION;  Surgeon: Werner Lean, MD;  Location: [facility];  Service: Cardiovascular;  Laterality: N/A;  . ESOPHAGOGASTRODUODENOSCOPY (EGD) WITH PROPOFOL N/A 10/01/2020   Procedure: ESOPHAGOGASTRODUODENOSCOPY (EGD) WITH PROPOFOL;  Surgeon: [name], MD;  Location: [place];  Service: Endoscopy;  Laterality: N/A;  . RIGHT/LEFT HEART CATH AND CORONARY ANGIOGRAPHY N/A 08/18/2020   Procedure: RIGHT/LEFT HEART  CATH AND CORONARY ANGIOGRAPHY;  Surgeon: [name], MD;  Location: [place] CV LAB;  Service: Cardiovascular;  Laterality: N/A;  . TEE WITHOUT CARDIOVERSION N/A 08/21/2020   Procedure: TRANSESOPHAGEAL ECHOCARDIOGRAM (TEE);  Surgeon: [name], MD;  Location: [facility] ENDOSCOPY;  Service: Cardiovascular;  Laterality: N/A;  . TEE WITHOUT CARDIOVERSION N/A 09/01/2020   Procedure: TRANSESOPHAGEAL ECHOCARDIOGRAM (TEE);  Surgeon: [name], MD;  Location: [place] CV LAB;   Service: Open Heart Surgery;  Laterality: N/A;  . TRANSCATHETER AORTIC VALVE REPLACEMENT, TRANSFEMORAL N/A 09/01/2020   Procedure: TRANSCATHETER AORTIC VALVE REPLACEMENT, TRANSFEMORAL;  Surgeon: [name], MD;  Location: [place] CV LAB;  Service: Open Heart Surgery;  Laterality: N/A;     Current Outpatient Medications  Medication Sig Dispense Refill  . albuterol (VENTOLIN HFA) 108 (90 Base) MCG/ACT inhaler Inhale 2 puffs into the lungs every 4 (four) hours as needed for wheezing or shortness of breath.     Marland Kitchen amiodarone (PACERONE) 200 MG tablet Take 1 tablet (200 mg total) by mouth daily. 90 tablet 3  . amoxicillin (AMOXIL) 500 MG capsule Take 2000 mg at least 1 hour prior to any dental procedure.    Marland Kitchen apixaban (ELIQUIS) 5 MG TABS tablet Take 1 tablet (5 mg total) by mouth 2 (two) times daily. 180 tablet 1  . Blood Glucose Monitoring Suppl (FIFTY50 GLUCOSE METER 2.0) w/Device KIT See admin instructions.    . Cholecalciferol 25 MCG (1000 UT) tablet Take 1,000 Units by mouth at bedtime.     Marland Kitchen diltiazem (CARDIZEM CD) 240 MG 24 hr capsule Take 240 mg by mouth daily.    . furosemide (LASIX) 40 MG tablet Take 1 tablet (40 mg total) by mouth daily. 90 tablet 2  . gabapentin (NEURONTIN) 300 MG capsule Take 600 mg by mouth 2 (two) times daily.     . hydrALAZINE (APRESOLINE) 25 MG tablet Take 1 tablet (25 mg total) by mouth every 8 (eight) hours. 180 tablet 2  . HYDROcodone-acetaminophen (NORCO) 10-325 MG tablet Take 1 tablet by mouth every 6 (six) hours as needed for moderate pain.     . Iron-Vitamins (GERITOL PO) Take 1 tablet by mouth daily.    Marland Kitchen latanoprost (XALATAN) 0.005 % ophthalmic solution Place 1 drop into both eyes at bedtime.     . metFORMIN (GLUCOPHAGE) 1000 MG tablet Take 1,000 mg by mouth 2 (two) times daily.    . metoprolol tartrate (LOPRESSOR) 50 MG tablet Take 1 tablet (50 mg total) by mouth 2 (two) times daily. 180 tablet 3  . Multiple Vitamin (MULTIVITAMIN WITH  MINERALS) TABS tablet Take 1 tablet by mouth at bedtime.     Marland Kitchen neomycin-bacitracin-polymyxin (NEOSPORIN) [phone] ointment Apply 1 application topically 2 (two) times daily as needed (itching).    Marland Kitchen oxyCODONE ER (XTAMPZA ER) 9 MG C12A Take 9 mg by mouth every 12 (twelve) hours.    . pantoprazole (PROTONIX) 40 MG tablet Take 40 mg by mouth 2 (two) times daily.    Marland Kitchen PRESCRIPTION MEDICATION Inhale into the lungs at bedtime. CPAP    . rosuvastatin (CRESTOR) 20 MG tablet Take 20 mg by mouth daily.      No current facility-administered medications for this visit.    Allergies:   Cocoa, Sulfamethoxazole-trimethoprim, Chocolate, Citrus, Fish allergy, Shellfish allergy, Sulfa antibiotics, and Tetracycline    Social History:  The patient  reports that he has quit smoking. He has never used smokeless tobacco. He reports previous drug use. He reports that  he does not drink alcohol.   Family History:  The patient's family history is not on file.    ROS: All other systems are reviewed and negative. Unless otherwise mentioned in H&P    PHYSICAL EXAM: VS:  BP (!) 147/74   Pulse 61   Ht $R'5\' 11"'RY$  (1.803 m)   Wt 298 lb (135.2 kg)   SpO2 98%   BMI 41.56 kg/m  , BMI Body mass index is 41.56 kg/m. GEN: Well nourished, well developed, in no acute distress, obese HEENT: normal Neck: no JVD, carotid bruits, or masses Cardiac: RRR; no murmurs, rubs, or gallops,no edema  Respiratory:  Clear to auscultation bilaterally, normal work of breathing GI: soft, nontender, nondistended, + BS MS: no deformity or atrophy Skin: warm and dry, no rash Neuro:  Strength and sensation are intact Psych: euthymic mood, full affect   EKG: (Personally reviewed) normal sinus rhythm, heart rate of 61 bpm with low voltage QRS.   Recent Labs: 10/14/2020: TSH 0.865 10/29/2020: ALT 13 01/18/2021: B Natriuretic Peptide 268.1; Hemoglobin 11.1; Platelets 230 2021/02/14: BUN 26; Creatinine, Ser 1.69; Magnesium 2.0; Potassium 4.0;  Sodium 140    Lipid Panel    Component Value Date/Time   CHOL 127 08/17/2020 0351   TRIG 69 08/17/2020 0351   HDL 39 (L) 08/17/2020 0351   CHOLHDL 3.3 08/17/2020 0351   VLDL 14 08/17/2020 0351   LDLCALC 74 08/17/2020 0351      Wt Readings from Last 3 Encounters:  02/12/21 298 lb (135.2 kg)  Feb 14, 2021 298 lb 8 oz (135.4 kg)  12/24/20 (!) 311 lb (141.1 kg)      Other studies Reviewed: Echocardiogram 02/14/2021  1. Left ventricular ejection fraction, by estimation, is 60 to 65%. The  left ventricle has normal function. The left ventricle has no regional  wall motion abnormalities. There is moderate concentric left ventricular  hypertrophy. Left ventricular  diastolic parameters are consistent with Grade III diastolic dysfunction  (restrictive). Elevated left atrial pressure.  2. Right ventricular systolic function is normal. The right ventricular  size is normal. There is severely elevated pulmonary artery systolic  pressure. The estimated right ventricular systolic pressure is 83.8 mmHg.  3. Left atrial size was severely dilated.  4. Right atrial size was mildly dilated.  5. The mitral valve is normal in structure. Trivial mitral valve  regurgitation. No evidence of mitral stenosis.  6. Tricuspid valve regurgitation is mild to moderate.  7. The aortic valve has been repaired/replaced. Aortic valve  regurgitation is not visualized. There is a 29 mm CoreValve-Evolut Pro  prosthetic (TAVR) valve present in the aortic position. Procedure Date:  09/01/2020. Echo findings are consistent with  normal structure and function of the aortic valve prosthesis. Aortic valve  mean gradient measures 13.0 mmHg. Aortic valve Vmax measures 2.46 m/s.  8. The inferior vena cava is dilated in size with >50% respiratory  variability, suggesting right atrial pressure of 8 mmHg.   ASSESSMENT AND PLAN:  1.  Paroxysmal atrial fibrillation: He remains on Eliquis 5 mg twice daily, with heart  rate controlled with diltiazem 240 mg daily, metoprolol 50 mg twice daily, and amiodarone 200 mg daily.  He denies any issues with palpitations or irregular heart rate since discharge from the hospital.  He is given samples of Eliquis due to cost issues and being able to afford this medication.  He will follow-up in 3 months unless symptomatic  2.  Chronic diastolic CHF: He remains on diuretic.  He is concerned  that he is urinating too much and is afraid of dehydration.  I will check a BMET today.  I have asked him to continue current dose of furosemide 40 mg daily, check his weight, and avoid salted foods.  3.  Hypertension: Slightly elevated today compared to discharge blood pressure.  He will continue hydralazine 25 mg every 8 hours along with above medications.  He is instructed on continuing a low-sodium diet.  We will see him again in 3 months.  4.  Hyperlipidemia: Continue rosuvastatin 20 mg daily.  Will need to have follow-up lipids and LFTs in 6 months if not completed by primary care.   Current medicines are reviewed at length with the patient today.  I have spent 24 minutes  dedicated to the care of this patient on the date of this encounter to include pre-visit review of records, assessment, management and diagnostic testing,with shared decision making.  Labs/ tests ordered today include: BMET Phill Myron. [place] Pugh, ANP, AACC   02/12/2021 12:22 PM    [facility] [place] [address] Office [phone] Fax [phone]  Notice: This dictation was prepared with Dragon dictation along with smaller phrase technology. Any transcriptional errors that result from this process are unintentional and may not be corrected upon review.

## 2021-02-12 ENCOUNTER — Ambulatory Visit: Payer: Medicare HMO | Admitting: Adult Health

## 2021-02-12 ENCOUNTER — Other Ambulatory Visit: Payer: Self-pay

## 2021-02-12 ENCOUNTER — Encounter: Payer: Self-pay | Admitting: Adult Health

## 2021-02-12 VITALS — BP 147/74 | HR 61 | Ht 71.0 in | Wt 298.0 lb

## 2021-02-12 DIAGNOSIS — I5020 Unspecified systolic (congestive) heart failure: Secondary | ICD-10-CM

## 2021-02-12 DIAGNOSIS — I4892 Unspecified atrial flutter: Secondary | ICD-10-CM | POA: Diagnosis not present

## 2021-02-12 DIAGNOSIS — I10 Essential (primary) hypertension: Secondary | ICD-10-CM | POA: Diagnosis not present

## 2021-02-12 DIAGNOSIS — Z79899 Other long term (current) drug therapy: Secondary | ICD-10-CM

## 2021-02-12 NOTE — Patient Instructions (Signed)
Medication Instructions:  The current medical regimen is effective;  continue present plan and medications as directed. Please refer to the Current Medication list given to you today.  *If you need a refill on your cardiac medications before your next appointment, please call your pharmacy*  Lab Work: BMET AND BNP TODAY If you have labs (blood work) drawn today and your tests are completely normal, you will receive your results only by:  MyChart Message (if you have MyChart) OR A paper copy in the mail.  If you have any lab test that is abnormal or we need to change your treatment, we will call you to review the results. You may go to any [facility] that is convenient for you however, we do have a lab in our office that is able to assist you. You DO NOT need an appointment for our lab. The lab is open 8:00am and closes at 4:00pm. Lunch 12:45 - 1:45pm.  Follow-Up: Your next appointment:  3 month(s) In Person with You may see Christell Constant, MD OR Joni Reining, DNP or one of the following Advanced Practice Providers on your designated Care Team:    Ronie Spies, PA-C  Jacolyn Reedy, PA-C  At [facility], you and your health needs are our priority.  As part of our continuing mission to provide you with exceptional heart care, we have created designated Provider Care Teams.  These Care Teams include your primary Cardiologist (physician) and Advanced Practice Providers (APPs -  Physician Assistants and Nurse Practitioners) who all work together to provide you with the care you need, when you need it.  We recommend signing up for the patient portal called "MyChart".  Sign up information is provided on this After Visit Summary.  MyChart is used to connect with patients for Virtual Visits (Telemedicine).  Patients are able to view lab/test results, encounter notes, upcoming appointments, etc.  Non-urgent messages can be sent to your provider as well.   To learn more about what you can do with  MyChart, go to [email or web address].

## 2021-02-13 LAB — BASIC METABOLIC PANEL
BUN/Creatinine Ratio: 17 (ref 10–24)
BUN: 27 mg/dL (ref 8–27)
CO2: 25 mmol/L (ref 20–29)
Calcium: 9.4 mg/dL (ref 8.6–10.2)
Chloride: 98 mmol/L (ref 96–106)
Creatinine, Ser: 1.62 mg/dL — ABNORMAL HIGH (ref 0.76–1.27)
Glucose: 93 mg/dL (ref 65–99)
Potassium: 4.4 mmol/L (ref 3.5–5.2)
Sodium: 143 mmol/L (ref 134–144)
eGFR: 46 mL/min/{1.73_m2} — ABNORMAL LOW (ref >59)

## 2021-02-13 LAB — BRAIN NATRIURETIC PEPTIDE: BNP: 433.7 pg/mL — ABNORMAL HIGH (ref 0.0–100.0)

## 2021-02-26 ENCOUNTER — Other Ambulatory Visit: Payer: Self-pay

## 2021-02-26 ENCOUNTER — Ambulatory Visit: Payer: Medicare HMO | Admitting: Pulmonary Disease

## 2021-02-26 ENCOUNTER — Encounter: Payer: Self-pay | Admitting: Pulmonary Disease

## 2021-02-26 VITALS — BP 124/84 | HR 60 | Temp 98.0°F | Ht 71.0 in | Wt 295.4 lb

## 2021-02-26 DIAGNOSIS — I272 Pulmonary hypertension, unspecified: Secondary | ICD-10-CM | POA: Diagnosis not present

## 2021-02-26 MED ORDER — ALBUTEROL SULFATE HFA 108 (90 BASE) MCG/ACT IN AERS: 2.0000 | INHALATION_SPRAY | RESPIRATORY_TRACT | 3 refills | Status: AC | PRN

## 2021-02-26 NOTE — Progress Notes (Signed)
Last night              Lawrence Rose    [id number]    [birth year]  Primary Care Physician:Silva Ocie Bob, MD  Referring Physician: Werner Lean, [place],  [place] [address]  Chief complaint:   Patient seen for shortness of breath  HPI:  Shortness of breath with exertion unfortunately, unable to exert himself because he has 2 bad knees that needs replaced In the wheelchair today Only able to walk a few steps because of his knees  He gets tired with activity not because he is winded but because his knees cannot hold his weight  He does not wheeze, does not really get short of breath  He does have a prescription for albuterol which he rarely uses, no recent exacerbation of asthma  Recently had a pulmonary function test which was reviewed today showing stable diffusing capacity reason for the pulmonary function study is because he is chronically been on amiodarone  His health is otherwise stable  Reformed smoker, quit over 30 years ago, was only smoking socially  His most significant symptom is just knee pain and limitations that come from it  Outpatient Encounter Medications as of 02/26/2021  Medication Sig  . albuterol (VENTOLIN HFA) 108 (90 Base) MCG/ACT inhaler Inhale 2 puffs into the lungs every 4 (four) hours as needed for wheezing or shortness of breath.   Marland Kitchen amiodarone (PACERONE) 200 MG tablet Take 1 tablet (200 mg total) by mouth daily.  Marland Kitchen amoxicillin (AMOXIL) 500 MG capsule Take 2000 mg at least 1 hour prior to any dental procedure.  Marland Kitchen apixaban (ELIQUIS) 5 MG TABS tablet Take 1 tablet (5 mg total) by mouth 2 (two) times daily.  . Blood Glucose Monitoring Suppl (FIFTY50 GLUCOSE METER 2.0) w/Device KIT See admin instructions.  . Cholecalciferol 25 MCG (1000 UT) tablet Take 1,000 Units by mouth at bedtime.   Marland Kitchen diltiazem (CARDIZEM CD) 240 MG 24 hr capsule Take 240 mg by mouth daily.  . furosemide (LASIX) 40 MG tablet Take 1 tablet (40  mg total) by mouth daily.  Marland Kitchen gabapentin (NEURONTIN) 300 MG capsule Take 600 mg by mouth 2 (two) times daily.   . hydrALAZINE (APRESOLINE) 25 MG tablet Take 1 tablet (25 mg total) by mouth every 8 (eight) hours.  Marland Kitchen HYDROcodone-acetaminophen (NORCO) 10-325 MG tablet Take 1 tablet by mouth every 6 (six) hours as needed for moderate pain.   . Iron-Vitamins (GERITOL PO) Take 1 tablet by mouth daily.  Marland Kitchen latanoprost (XALATAN) 0.005 % ophthalmic solution Place 1 drop into both eyes at bedtime.   . metFORMIN (GLUCOPHAGE) 1000 MG tablet Take 1,000 mg by mouth 2 (two) times daily.  . metoprolol tartrate (LOPRESSOR) 50 MG tablet Take 1 tablet (50 mg total) by mouth 2 (two) times daily.  . Multiple Vitamin (MULTIVITAMIN WITH MINERALS) TABS tablet Take 1 tablet by mouth at bedtime.   Marland Kitchen neomycin-bacitracin-polymyxin (NEOSPORIN) [phone] ointment Apply 1 application topically 2 (two) times daily as needed (itching).  Marland Kitchen oxyCODONE ER (XTAMPZA ER) 9 MG C12A Take 9 mg by mouth every 12 (twelve) hours.  . pantoprazole (PROTONIX) 40 MG tablet Take 40 mg by mouth 2 (two) times daily.  Marland Kitchen PRESCRIPTION MEDICATION Inhale into the lungs at bedtime. CPAP  . rosuvastatin (CRESTOR) 20 MG tablet Take 20 mg by mouth daily.    No facility-administered encounter medications on file as of 02/26/2021.    Allergies as of 02/26/2021 - Review  Complete 02/26/2021  Allergen Reaction Noted  . Cocoa Hives 10/28/2014  . Sulfamethoxazole-trimethoprim Anaphylaxis 07/22/2014  . Chocolate Hives 06/08/2014  . Citrus Hives 08/16/2020  . Fish allergy Hives and Swelling 06/08/2014  . Shellfish allergy Hives and Swelling 08/16/2020  . Sulfa antibiotics Hives and Swelling 06/08/2014  . Tetracycline Swelling 01/11/2016    Past Medical History:  Diagnosis Date  . Acid reflux   . Allergic arthritis, hand    per patient, both hands  . Anemia   . Apnea   . Asthma   . Carpal tunnel syndrome    per patient, both hands  . Chronic back  pain   . Chronic kidney disease   . Chronic knee pain   . Diabetes mellitus without complication ([place])   . Dysrhythmia   . GI bleeding 09/2020  . Hypertension   . 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 Angelena Form and Dr. Cyndia Bent.   . Severe aortic stenosis   . Sleep apnea    wears CPAP every night    Past Surgical History:  Procedure Laterality Date  . BACK SURGERY    . CARDIOVERSION  01/18/2021  . CARDIOVERSION N/A 01/18/2021   Procedure: CARDIOVERSION;  Surgeon: Werner Lean, MD;  Location: [facility];  Service: Cardiovascular;  Laterality: N/A;  . ESOPHAGOGASTRODUODENOSCOPY (EGD) WITH PROPOFOL N/A 10/01/2020   Procedure: ESOPHAGOGASTRODUODENOSCOPY (EGD) WITH PROPOFOL;  Surgeon: [name], MD;  Location: [facility];  Service: Endoscopy;  Laterality: N/A;  . RIGHT/LEFT HEART CATH AND CORONARY ANGIOGRAPHY N/A 08/18/2020   Procedure: RIGHT/LEFT HEART CATH AND CORONARY ANGIOGRAPHY;  Surgeon: [name], MD;  Location: [place] [facility];  Service: Cardiovascular;  Laterality: N/A;  . TEE WITHOUT CARDIOVERSION N/A 08/21/2020   Procedure: TRANSESOPHAGEAL ECHOCARDIOGRAM (TEE);  Surgeon: [name], MD;  Location: [facility] ENDOSCOPY;  Service: Cardiovascular;  Laterality: N/A;  . TEE WITHOUT CARDIOVERSION N/A 09/01/2020   Procedure: TRANSESOPHAGEAL ECHOCARDIOGRAM (TEE);  Surgeon: [name], MD;  Location: [place] [facility];  Service: Open Heart Surgery;  Laterality: N/A;  . TRANSCATHETER AORTIC VALVE REPLACEMENT, TRANSFEMORAL N/A 09/01/2020   Procedure: TRANSCATHETER AORTIC VALVE REPLACEMENT, TRANSFEMORAL;  Surgeon: [name], MD;  Location: [place] [facility];  Service: Open Heart Surgery;  Laterality: N/A;    History reviewed. No pertinent family history.  Social History   Socioeconomic History  . Marital status: Single    Spouse name:  Not on file  . Number of children: Not on file  . Years of education: Not on file  . Highest education level: Not on file  Occupational History  . Not on file  Tobacco Use  . Smoking status: Former Research scientist (life sciences)  . Smokeless tobacco: Never Used  . Tobacco comment: per patient quit over 30 years ago  Vaping Use  . Vaping Use: Never used  Substance and Sexual Activity  . Alcohol use: No  . Drug use: Not Currently  . Sexual activity: Not on file  Other Topics Concern  . Not on file  Social History Narrative  . Not on file   Social Determinants of Health   Financial Resource Strain: Not on file  Food Insecurity: Not on file  Transportation Needs: Not on file  Physical Activity: Not on file  Stress: Not on file  Social Connections: Not on file  Intimate Partner Violence: Not on file    Review of Systems  Constitutional: Positive for fatigue.  Musculoskeletal: Positive for arthralgias and gait problem.    Vitals:   02/26/21 0955  BP: 124/84  Pulse: 60  Temp: 98 F (36.7 C)  SpO2: 96%     Physical Exam Constitutional:      Appearance: He is obese.  HENT:     Head: Normocephalic.     Mouth/Throat:     Mouth: Mucous membranes are moist.  Cardiovascular:     Rate and Rhythm: Normal rate and regular rhythm.     Heart sounds: No murmur heard. No friction rub.  Pulmonary:     Effort: No respiratory distress.     Breath sounds: No stridor. No wheezing or rhonchi.  Musculoskeletal:     Cervical back: No rigidity or tenderness.  Neurological:     Mental Status: He is alert.  Psychiatric:        Mood and Affect: Mood normal.      Data Reviewed: PFT with no significant obstruction, mild restriction, mildly reduced diffusing capacity that is stable compared to prior PFT performed 38month prior  Assessment:  Abnormal pulmonary function test with restriction -This is likely related to body habitus -Concern with being on amiodarone however, diffusing capacity remains  stable  History of asthma -No significant exacerbations -Prescription for albuterol be sent in to be used as needed  Significant pain and discomfort, bilateral knees -He is following up for this and being evaluated for possible intervention  Plan/Recommendations: Continue current medications  Prescription for albuterol sent into pharmacy  Activity as tolerated  Repeat pulmonary function test in about 6 months  Tentative follow-up in 6 months   ASherrilyn RistMD [facility] 02/26/2021, 10:05 AM  CC: CRudean HaskellA*

## 2021-02-26 NOTE — Patient Instructions (Signed)
Follow-up in 6 months  Prescription for albuterol sent to pharmacy for you -Use albuterol as needed  Schedule pulmonary function test for 6 months  Call with significant concerns

## 2021-03-02 ENCOUNTER — Ambulatory Visit: Payer: Medicare HMO | Admitting: Internal Medicine

## 2021-03-12 ENCOUNTER — Other Ambulatory Visit: Payer: Self-pay

## 2021-03-12 MED ORDER — FUROSEMIDE 40 MG PO TABS: 40.0000 mg | ORAL_TABLET | Freq: Every day | ORAL | 2 refills | Status: DC

## 2021-05-21 ENCOUNTER — Encounter: Payer: Self-pay | Admitting: Adult Health

## 2021-05-21 ENCOUNTER — Ambulatory Visit: Payer: Medicare HMO | Admitting: Adult Health

## 2021-05-21 ENCOUNTER — Other Ambulatory Visit: Payer: Self-pay

## 2021-05-21 VITALS — BP 117/61 | HR 62 | Ht 71.0 in | Wt 289.0 lb

## 2021-05-21 DIAGNOSIS — Z953 Presence of xenogenic heart valve: Secondary | ICD-10-CM

## 2021-05-21 DIAGNOSIS — Z952 Presence of prosthetic heart valve: Secondary | ICD-10-CM

## 2021-05-21 DIAGNOSIS — I10 Essential (primary) hypertension: Secondary | ICD-10-CM | POA: Diagnosis not present

## 2021-05-21 DIAGNOSIS — I5032 Chronic diastolic (congestive) heart failure: Secondary | ICD-10-CM

## 2021-05-21 MED ORDER — DILTIAZEM HCL ER COATED BEADS 240 MG PO CP24: 240.0000 mg | ORAL_CAPSULE | Freq: Every day | ORAL | 2 refills | Status: DC

## 2021-05-21 MED ORDER — APIXABAN 5 MG PO TABS
5.0000 mg | ORAL_TABLET | Freq: Two times a day (BID) | ORAL | 1 refills | Status: DC
Start: 2021-05-21 — End: 2022-05-24

## 2021-05-21 MED ORDER — AMIODARONE HCL 200 MG PO TABS: 200.0000 mg | ORAL_TABLET | Freq: Every day | ORAL | 3 refills | Status: DC

## 2021-05-21 MED ORDER — HYDRALAZINE HCL 25 MG PO TABS
25.0000 mg | ORAL_TABLET | Freq: Three times a day (TID) | ORAL | 2 refills | Status: DC
Start: 2021-05-21 — End: 2021-10-13

## 2021-05-21 NOTE — Progress Notes (Signed)
Cardiology Office Note   Date:  05/21/2021   ID:  Lawrence Rose, DOB 1956/05/26, MRN [id number]  PCP:  [name], [name], MD  Cardiologist:  Dr. [name]  No chief complaint on file.    History of Present Illness: Lawrence Rose is a 65 y.o. male who presents for ongoing assessment and management of HFPF, with other history to include severe aortic valve stenosis status post TAVR on 09/05/2020 with postoperative, atrial flutter, atrial fibrillation status post cardioversion, asymptomatic hypertensive urgency, with other history to include chronic kidney disease stage III with a GFR between 30 and 59 mL/min, OSA on CPAP, and diabetes type 2.   It was noted that the patient was initially placed on apixaban but experienced GI bleeding for about 5 days after beginning this medication.  He was found to have erosive gastritis but no obviously bleeding source on an EGD.  He was also referred to Dr. Sherren Mocha for further evaluation of a watchman device however he did not meet qualifications with the plan for a follow-up DCCV.  During hospitalization the patient did have a DCCV on 01/18/2021 which was successful in returning into normal sinus rhythm.  However, it was felt the patient was volume overloaded with weight gain and leg and abdominal swelling, and therefore he was admitted for overnight diuresis.  He was treated with IV Lasix with good urine output and transition to p.o. Lasix 40 mg daily.  Weight on discharge was 298 pounds (down from 310 pounds) due to hypertension noted during hospitalization he was started on hydralazine 25 mg 3 times daily. Discharge blood pressure 133/69.   He comes today in a wheelchair with plan right knee arthroscopic surgery on August 15.  He is doing well and is lost approximately 10 pounds since April 2022.  He denies any chest discomfort dyspnea rapid heart rhythm or palpitations.  He is medically compliant.  He does request help with Eliquis.  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 Angelena Form and Dr. Cyndia Bent.    Severe aortic stenosis    Sleep apnea    wears CPAP every night    Past Surgical History:  Procedure Laterality Date   BACK SURGERY     CARDIOVERSION  01/18/2021   CARDIOVERSION N/A 01/18/2021   Procedure: CARDIOVERSION;  Surgeon: Werner Lean, MD;  Location: [facility];  Service: Cardiovascular;  Laterality: N/A;   ESOPHAGOGASTRODUODENOSCOPY (EGD) WITH PROPOFOL N/A 10/01/2020   Procedure: ESOPHAGOGASTRODUODENOSCOPY (EGD) WITH PROPOFOL;  Surgeon: [name], MD;  Location: [place] ENDOSCOPY;  Service: Endoscopy;  Laterality: N/A;   RIGHT/LEFT HEART CATH AND CORONARY ANGIOGRAPHY N/A 08/18/2020   Procedure: RIGHT/LEFT HEART CATH AND CORONARY ANGIOGRAPHY;  Surgeon: [name], MD;  Location: [place] [facility];  Service: Cardiovascular;  Laterality: N/A;   TEE WITHOUT CARDIOVERSION N/A 08/21/2020   Procedure: TRANSESOPHAGEAL ECHOCARDIOGRAM (TEE);  Surgeon: [name], MD;  [facility]: [facility];  Service: Cardiovascular;  Laterality: N/A;   TEE WITHOUT CARDIOVERSION N/A 09/01/2020   Procedure: TRANSESOPHAGEAL ECHOCARDIOGRAM (TEE);  Surgeon: [name], MD;  Location: [place] [facility];  Service: Open Heart Surgery;  Laterality: N/A;   TRANSCATHETER AORTIC VALVE REPLACEMENT, TRANSFEMORAL N/A 09/01/2020   Procedure: TRANSCATHETER AORTIC VALVE REPLACEMENT, TRANSFEMORAL;  Surgeon: [name], MD;  Location: [place] [facility];   Service: Open Heart Surgery;  Laterality: N/A;     Current Outpatient Medications  Medication Sig Dispense Refill   albuterol (VENTOLIN HFA) 108 (90 Base) MCG/ACT inhaler Inhale 2 puffs into the lungs every 4 (four) hours as needed for wheezing or shortness of breath. 8 g 3   amiodarone (PACERONE) 200 MG tablet Take 1 tablet (200 mg total) by mouth daily. 90 tablet 3   amoxicillin (AMOXIL) 500 MG capsule Take 2000 mg at least 1 hour prior to any dental procedure.     apixaban (ELIQUIS) 5 MG TABS tablet Take 1 tablet (5 mg total) by mouth 2 (two) times daily. 180 tablet 1   Blood Glucose Monitoring Suppl (FIFTY50 GLUCOSE METER 2.0) w/Device KIT See admin instructions.     Cholecalciferol 25 MCG (1000 UT) tablet Take 1,000 Units by mouth at bedtime.      diltiazem (CARDIZEM CD) 240 MG 24 hr capsule Take 240 mg by mouth daily.     furosemide (LASIX) 40 MG tablet Take 1 tablet (40 mg total) by mouth daily. 90 tablet 2   gabapentin (NEURONTIN) 300 MG capsule Take 600 mg by mouth 2 (two) times daily.      hydrALAZINE (APRESOLINE) 25 MG tablet Take 1 tablet (25 mg total) by mouth every 8 (eight) hours. 180 tablet 2   HYDROcodone-acetaminophen (NORCO) 10-325 MG tablet Take 1 tablet by mouth every 6 (six) hours as needed for moderate pain.      Iron-Vitamins (GERITOL PO) Take 1 tablet by mouth daily.     latanoprost (XALATAN) 0.005 % ophthalmic solution Place 1 drop into both eyes at bedtime.      metFORMIN (GLUCOPHAGE) 1000 MG tablet Take 1,000 mg by mouth 2 (two) times daily.     metoprolol tartrate (LOPRESSOR) 50 MG tablet Take 1 tablet (50 mg total) by mouth 2 (two) times daily. 180 tablet 3   Multiple Vitamin (MULTIVITAMIN WITH MINERALS) TABS tablet Take 1 tablet by mouth at bedtime.      neomycin-bacitracin-polymyxin (NEOSPORIN) [phone] ointment Apply 1 application topically 2 (two) times daily as needed (itching).     oxyCODONE ER (XTAMPZA ER) 9 MG C12A Take 9 mg by mouth every 12 (twelve)  hours.     pantoprazole (PROTONIX) 40 MG tablet Take 40 mg by mouth 2 (two) times daily.     PRESCRIPTION MEDICATION Inhale into the lungs at bedtime. CPAP     rosuvastatin (CRESTOR) 20 MG tablet Take 20 mg by mouth daily.      No current facility-administered medications for this visit.    Allergies:   Cocoa, Sulfamethoxazole-trimethoprim, Chocolate, Citrus, Fish allergy, Shellfish allergy, Sulfa antibiotics, and Tetracycline    Social History:  The patient  reports that he has quit smoking. He has never used smokeless tobacco. He reports previous drug use. He reports that he does not drink alcohol.   Family History:  The patient's family history is not on file.    ROS: All other systems are reviewed and negative. Unless otherwise mentioned in H&P    PHYSICAL EXAM: VS:  BP  117/61   Pulse 62   Ht '5\' 11"'  (1.803 m)   Wt 289 lb (131.1 kg)   SpO2 96%   BMI 40.31 kg/m  , BMI Body mass index is 40.31 kg/m. GEN: Well nourished, well developed, in no acute distress HEENT: normal Neck: no JVD, left carotid bruits, or masses Cardiac: RRR; 1/6 murmurs heard best at the left sternal border no rubs, or gallops,no edema  Respiratory:  Clear to auscultation bilaterally, normal work of breathing GI: soft, nontender, nondistended, + BS MS: no deformity or atrophy.  Wearing bilateral braces on his knees. Skin: warm and dry, no rash Neuro:  Strength and sensation are intact Psych: euthymic mood, full affect   EKG:  EKG is not ordered today.    Recent Labs: 10/14/2020: TSH 0.865 10/29/2020: ALT 13 01/18/2021: Hemoglobin 11.1; Platelets 230 2021-02-10: Magnesium 2.0 02/12/2021: BNP 433.7; BUN 27; Creatinine, Ser 1.62; Potassium 4.4; Sodium 143    Lipid Panel    Component Value Date/Time   CHOL 127 08/17/2020 0351   TRIG 69 08/17/2020 0351   HDL 39 (L) 08/17/2020 0351   CHOLHDL 3.3 08/17/2020 0351   VLDL 14 08/17/2020 0351   LDLCALC 74 08/17/2020 0351      Wt Readings from Last 3  Encounters:  05/21/21 289 lb (131.1 kg)  02/26/21 295 lb 6.4 oz (134 kg)  02/12/21 298 lb (135.2 kg)      Other studies Reviewed: Echocardiogram 2021-02-10  1. Left ventricular ejection fraction, by estimation, is 60 to 65%. The  left ventricle has normal function. The left ventricle has no regional  wall motion abnormalities. There is moderate concentric left ventricular  hypertrophy. Left ventricular  diastolic parameters are consistent with Grade III diastolic dysfunction  (restrictive). Elevated left atrial pressure.   2. Right ventricular systolic function is normal. The right ventricular  size is normal. There is severely elevated pulmonary artery systolic  pressure. The estimated right ventricular systolic pressure is 98.4 mmHg.   3. Left atrial size was severely dilated.   4. Right atrial size was mildly dilated.   5. The mitral valve is normal in structure. Trivial mitral valve  regurgitation. No evidence of mitral stenosis.   6. Tricuspid valve regurgitation is mild to moderate.   7. The aortic valve has been repaired/replaced. Aortic valve  regurgitation is not visualized. There is a 29 mm CoreValve-Evolut Pro  prosthetic (TAVR) valve present in the aortic position. Procedure Date:  09/01/2020. Echo findings are consistent with  normal structure and function of the aortic valve prosthesis. Aortic valve  mean gradient measures 13.0 mmHg. Aortic valve Vmax measures 2.46 m/s.   8. The inferior vena cava is dilated in size with >50% respiratory  variability, suggesting right atrial pressure of 8 mmHg  ASSESSMENT AND PLAN: Chronic diastolic heart failure: He  is euvolemic today and medically compliant.  He is actually down 10 pounds since being seen in April 2022.  His son is helping him with his diet and medications.  No changes in his medication regimen at this time.  We will see him again in 6 months.  2.  Paroxysmal atrial fibrillation: Continues on diltiazem, amiodarone,  metoprolol, and Eliquis.  Denies any issues with medications with exception of cost of Eliquis.  We are giving him financial aid forms and some samples of Eliquis.  He is holding Eliquis 5 days prior to the knee surgery.     3.  Hypertension: Blood pressure is very well controlled.  We will  need to keep a check on that as she continues to lose weight and this has been progressively becoming lower.  Since she is wheelchair-bound currently I will not make any changes and will follow him after surgery.   4.  Severe aortic valve stenosis: Status post TAVR.  Doing well.  Last echocardiogram completed April 2022.  Normal functioning aortic valve.  Current medicines are reviewed at length with the patient today.  I have spent 25 mins dedicated to the care of this patient on the date of this encounter to include pre-visit review of records, assessment, management and diagnostic testing,with shared decision making.  Labs/ tests ordered today include: None Phill Myron. West Pugh, ANP, AACC   05/21/2021 2:06 PM    [place] [address] Office [phone] Fax [phone]  Notice: This dictation was prepared with Dragon dictation along with smaller phrase technology. Any transcriptional errors that result from this process are unintentional and may not be corrected upon review.

## 2021-05-21 NOTE — Patient Instructions (Signed)
Medication Instructions:  No Changes *If you need a refill on your cardiac medications before your next appointment, please call your pharmacy*   Lab Work: Labs If you have labs (blood work) drawn today and your tests are completely normal, you will receive your results only by: MyChart Message (if you have MyChart) OR A paper copy in the mail If you have any lab test that is abnormal or we need to change your treatment, we will call you to review the results.   Testing/Procedures: No Testing   Follow-Up: At [facility], you and your health needs are our priority.  As part of our continuing mission to provide you with exceptional heart care, we have created designated Provider Care Teams.  These Care Teams include your primary Cardiologist (physician) and Advanced Practice Providers (APPs -  Physician Assistants and Nurse Practitioners) who all work together to provide you with the care you need, when you need it.  We recommend signing up for the patient portal called "MyChart".  Sign up information is provided on this After Visit Summary.  MyChart is used to connect with patients for Virtual Visits (Telemedicine).  Patients are able to view lab/test results, encounter notes, upcoming appointments, etc.  Non-urgent messages can be sent to your provider as well.   To learn more about what you can do with MyChart, go to [email or web address].    Your next appointment:   6 month(s)  The format for your next appointment:   In Person  Provider:   Riley Lam, MD

## 2021-06-25 ENCOUNTER — Other Ambulatory Visit: Payer: Self-pay | Admitting: Physician Assistant

## 2021-06-25 DIAGNOSIS — Z952 Presence of prosthetic heart valve: Secondary | ICD-10-CM

## 2021-06-25 NOTE — Addendum Note (Signed)
Addended by: Janetta Hora on: 06/25/2021 10:21 AM   Modules accepted: Orders

## 2021-08-26 ENCOUNTER — Encounter: Payer: Self-pay | Admitting: Physician Assistant

## 2021-08-26 ENCOUNTER — Other Ambulatory Visit: Payer: Self-pay

## 2021-08-26 ENCOUNTER — Ambulatory Visit: Payer: Medicare HMO | Admitting: Physician Assistant

## 2021-08-26 ENCOUNTER — Ambulatory Visit (HOSPITAL_COMMUNITY): Payer: Medicare HMO | Attending: Cardiology

## 2021-08-26 VITALS — BP 126/56 | HR 58 | Ht 71.0 in | Wt 282.0 lb

## 2021-08-26 DIAGNOSIS — I4892 Unspecified atrial flutter: Secondary | ICD-10-CM

## 2021-08-26 DIAGNOSIS — Z952 Presence of prosthetic heart valve: Secondary | ICD-10-CM | POA: Insufficient documentation

## 2021-08-26 DIAGNOSIS — Z8719 Personal history of other diseases of the digestive system: Secondary | ICD-10-CM | POA: Diagnosis not present

## 2021-08-26 DIAGNOSIS — I10 Essential (primary) hypertension: Secondary | ICD-10-CM

## 2021-08-26 DIAGNOSIS — N1832 Chronic kidney disease, stage 3b: Secondary | ICD-10-CM

## 2021-08-26 DIAGNOSIS — I5032 Chronic diastolic (congestive) heart failure: Secondary | ICD-10-CM

## 2021-08-26 LAB — ECHOCARDIOGRAM COMPLETE
AV Mean grad: 10.6 mmHg
AV Peak grad: 19.5 mmHg
Ao pk vel: 2.21 m/s
Area-P 1/2: 4.06 cm2
S' Lateral: 2.6 cm

## 2021-08-26 NOTE — Patient Instructions (Signed)
Medication Instructions:  °Your physician recommends that you continue on your current medications as directed. Please refer to the Current Medication list given to you today. ° °*If you need a refill on your cardiac medications before your next appointment, please call your pharmacy* ° ° °Lab Work: °None ordered  ° °If you have labs (blood work) drawn today and your tests are completely normal, you will receive your results only by: °MyChart Message (if you have MyChart) OR °A paper copy in the mail °If you have any lab test that is abnormal or we need to change your treatment, we will call you to review the results. ° ° °Testing/Procedures: °None ordered  ° ° °Follow-Up: °Follow up as scheduled 1}  ° ° °Other Instructions °None   °

## 2021-08-26 NOTE — Progress Notes (Signed)
[facility] [place]                                       Cardiology Office Note    Date:  08/26/2021   ID:  Lawrence Rose, DOB Feb 15, 1956, MRN [id number]  PCP:  [name], [name], MD  Cardiologist: Werner Lean, MD / Dr. Buena Irish & Dr. Cyndia Bent (TAVR)  CC: 1 month s/p TAVR  History of Present Illness:  Lawrence Rose is a 65 y.o. male with a history of HTN, HLD, anemia, morbid obesity, chronic back and knee pain with limited mobility, OSA on CPAP, CKD stage III, pulmonary HTN, sick euthyroid syndrome, mild MS, atrial flutter on eliquis, previous GI bleed and severe aortic stenosis s/p TAVR (09/01/20) who presents to clinic for follow up.   He underwent successful TAVR with a 29 mm Medtronic Evolut Pro+ THV via the TF approach on 09/01/20. Post operative echo showed EF >75%, normally functioning TAVR with a mean gradient of 17 mmHg and no PVL. She was discharged on DAPT.   In follow up the was diagnosed with atrial flutter with RVR and started with eliquis and amiodarone (DAPT stopped). He also developed acute kidney injury and acute blood loss anemia with a hg down to 7.4.  FOBT+. He was ultimately admitted 121/4-12/17/21 for transfusion and GI work up. EGD on 10/01/2020 showed erosive esophagitis. He was started on a PPI. H&H remained stable posttransfusion.  He also had AKI with creat up to 1.4 which was treated with IV fluids. Echo during admission showed EF 55%, normally functioning TAVR with a mean gradient of 7 mmHg and no PVL. He was also referred to Dr. Sherren Mocha for further evaluation of a watchman device however he did not wish to pursue any other further invasive interventions at that time. Plan for a follow-up DCCV was made.   He was admitted in 01/2021 for acute CHF after DCCV. Weight on discharge was 298 pounds (down from 310 pounds) due to hypertension noted during hospitalization he was started on hydralazine 25 mg 3  times daily.  Had a R TKA in 07/2021 in [place] and has been rehabbing from that with plans to do left knee in near future.   Today he presents to clinic for follow up.  Here with his brother today.  He is doing much better since cardioversion in April.  He recently had his knee replaced and is working with PT.  He has chronic dyspnea on exertion and some fatigue.  No chest pain.  No lower extremity edema, orthopnea or PND.  No blood in his stool or urine.   Past Medical History:  Diagnosis Date   Acid reflux    Allergic arthritis, hand    per patient, both hands   Anemia    Apnea    Asthma    Carpal tunnel syndrome    per patient, both hands   Chronic back pain    Chronic kidney disease    Chronic knee pain    Diabetes mellitus without complication ([place])    Dysrhythmia    GI bleeding 09/2020   Hypertension    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   s/p TAVR with a 29 mm Medtronic Evolut Pro+ via the TF approach by Dr. Angelena Form and Dr. Cyndia Bent.    Severe aortic stenosis    Sleep apnea  wears CPAP every night    Past Surgical History:  Procedure Laterality Date   BACK SURGERY     CARDIOVERSION  01/18/2021   CARDIOVERSION N/A 01/18/2021   Procedure: CARDIOVERSION;  Surgeon: Werner Lean, MD;  Location: [facility];  Service: Cardiovascular;  Laterality: N/A;   ESOPHAGOGASTRODUODENOSCOPY (EGD) WITH PROPOFOL N/A 10/01/2020   Procedure: ESOPHAGOGASTRODUODENOSCOPY (EGD) WITH PROPOFOL;  Surgeon: [name], MD;  Location: [facility];  Service: Endoscopy;  Laterality: N/A;   RIGHT/LEFT HEART CATH AND CORONARY ANGIOGRAPHY N/A 08/18/2020   Procedure: RIGHT/LEFT HEART CATH AND CORONARY ANGIOGRAPHY;  Surgeon: [name], MD;  Location: [facility];  Service: Cardiovascular;  Laterality: N/A;   TEE WITHOUT CARDIOVERSION N/A 08/21/2020   Procedure: TRANSESOPHAGEAL ECHOCARDIOGRAM (TEE);  Surgeon: [name], MD;  Location: [facility]  ENDOSCOPY;  Service: Cardiovascular;  Laterality: N/A;   TEE WITHOUT CARDIOVERSION N/A 09/01/2020   Procedure: TRANSESOPHAGEAL ECHOCARDIOGRAM (TEE);  Surgeon: [name], MD;  Location: [facility];  Service: Open Heart Surgery;  Laterality: N/A;   TRANSCATHETER AORTIC VALVE REPLACEMENT, TRANSFEMORAL N/A 09/01/2020   Procedure: TRANSCATHETER AORTIC VALVE REPLACEMENT, TRANSFEMORAL;  Surgeon: [name], MD;  Location: [facility];  Service: Open Heart Surgery;  Laterality: N/A;    Current Medications: Outpatient Medications Prior to Visit  Medication Sig Dispense Refill   albuterol (VENTOLIN HFA) 108 (90 Base) MCG/ACT inhaler Inhale 2 puffs into the lungs every 4 (four) hours as needed for wheezing or shortness of breath. 8 g 3   amiodarone (PACERONE) 200 MG tablet Take 1 tablet (200 mg total) by mouth daily. 90 tablet 3   amoxicillin (AMOXIL) 500 MG capsule Take 2000 mg at least 1 hour prior to any dental procedure.     apixaban (ELIQUIS) 5 MG TABS tablet Take 1 tablet (5 mg total) by mouth 2 (two) times daily. 180 tablet 1   Blood Glucose Monitoring Suppl (FIFTY50 GLUCOSE METER 2.0) w/Device KIT See admin instructions.     Cholecalciferol 25 MCG (1000 UT) tablet Take 1,000 Units by mouth at bedtime.      diltiazem (CARDIZEM CD) 240 MG 24 hr capsule Take 1 capsule (240 mg total) by mouth daily. 90 capsule 2   furosemide (LASIX) 40 MG tablet Take 1 tablet (40 mg total) by mouth daily. 90 tablet 2   gabapentin (NEURONTIN) 300 MG capsule Take 600 mg by mouth 2 (two) times daily.      hydrALAZINE (APRESOLINE) 25 MG tablet Take 1 tablet (25 mg total) by mouth every 8 (eight) hours. 180 tablet 2   HYDROcodone-acetaminophen (NORCO) 10-325 MG tablet Take 1 tablet by mouth every 6 (six) hours as needed for moderate pain.      Iron-Vitamins (GERITOL PO) Take 1 tablet by mouth daily.     latanoprost (XALATAN) 0.005 % ophthalmic solution Place 1 drop into both eyes  at bedtime.      lisinopril (ZESTRIL) 40 MG tablet Take 1 tablet by mouth daily.     metFORMIN (GLUCOPHAGE) 1000 MG tablet Take 1,000 mg by mouth 2 (two) times daily.     metoprolol tartrate (LOPRESSOR) 50 MG tablet Take 1 tablet (50 mg total) by mouth 2 (two) times daily. 180 tablet 3   Multiple Vitamin (MULTIVITAMIN WITH MINERALS) TABS tablet Take 1 tablet by mouth at bedtime.      neomycin-bacitracin-polymyxin (NEOSPORIN) [phone] ointment Apply 1 application topically 2 (two) times daily as needed (itching).     oxyCODONE ER (XTAMPZA  ER) 9 MG C12A Take 9 mg by mouth every 12 (twelve) hours.     pantoprazole (PROTONIX) 40 MG tablet Take 40 mg by mouth 2 (two) times daily.     PRESCRIPTION MEDICATION Inhale into the lungs at bedtime. CPAP     rosuvastatin (CRESTOR) 20 MG tablet Take 20 mg by mouth daily.      No facility-administered medications prior to visit.     Allergies:   Cocoa, Sulfamethoxazole-trimethoprim, Chocolate, Citrus, Fish allergy, Shellfish allergy, Sulfa antibiotics, and Tetracycline   Social History   Socioeconomic History   Marital status: Single    Spouse name: Not on file   Number of children: Not on file   Years of education: Not on file   Highest education level: Not on file  Occupational History   Not on file  Tobacco Use   Smoking status: Former   Smokeless tobacco: Never   Tobacco comments:    per patient quit over 30 years ago  Vaping Use   Vaping Use: Never used  Substance and Sexual Activity   Alcohol use: No   Drug use: Not Currently   Sexual activity: Not on file  Other Topics Concern   Not on file  Social History Narrative   Not on file   Social Determinants of Health   Financial Resource Strain: Not on file  Food Insecurity: Not on file  Transportation Needs: Not on file  Physical Activity: Not on file  Stress: Not on file  Social Connections: Not on file     Family History:  The patient'sfamily history is not on file.      ROS:   Please see the history of present illness.    ROS All other systems reviewed and are negative.   PHYSICAL EXAM:   VS:  BP (!) 126/56   Pulse (!) 58   Ht 5' 11" (1.803 m)   Wt 282 lb (127.9 kg)   SpO2 96%   BMI 39.33 kg/m    GEN: Well nourished, well developed, in no acute distress, morbidly obese HEENT: normal Neck: no JVD or masses Cardiac: RRR, distant heart sounds. no murmurs, rubs, or gallops,no edema  Respiratory:  clear to auscultation bilaterally, normal work of breathing GI: soft, nontender, nondistended, + BS MS: no deformity or atrophy Skin: warm and dry, no rash    Neuro:  Alert and Oriented x 3, Strength and sensation are intact Psych: euthymic mood, full affect   Wt Readings from Last 3 Encounters:  08/26/21 282 lb (127.9 kg)  05/21/21 289 lb (131.1 kg)  02/26/21 295 lb 6.4 oz (134 kg)      Studies/Labs Reviewed:   EKG:  EKG is NOT ordered today.    Recent Labs: 10/14/2020: TSH 0.865 10/29/2020: ALT 13 01/18/2021: Hemoglobin 11.1; Platelets 230 01/19/2021: Magnesium 2.0 02/12/2021: BNP 433.7; BUN 27; Creatinine, Ser 1.62; Potassium 4.4; Sodium 143   Lipid Panel    Component Value Date/Time   CHOL 127 08/17/2020 0351   TRIG 69 08/17/2020 0351   HDL 39 (L) 08/17/2020 0351   CHOLHDL 3.3 08/17/2020 0351   VLDL 14 08/17/2020 0351   LDLCALC 74 08/17/2020 0351    Additional studies/ records that were reviewed today include:  TAVR OPERATIVE NOTE    Lawrence Rose [id number]   Date of Procedure:                 09/01/2020   Preoperative Diagnosis:      Severe Aortic Stenosis  Postoperative Diagnosis:    Same    Procedure:        Transcatheter Aortic Valve Replacement - Percutaneous Right Transfemoral Approach             Medtronic Evolut-Pro + (size 29 mm,  serial # [id number])              Co-Surgeons:            Gaye Pollack, MD and Lauree Chandler, MD     Anesthesiologist:                  Suzette Battiest, MD    Echocardiographer:              Ena Dawley, MD   Pre-operative Echo Findings: Severe aortic stenosis and moderate AI  Normal left ventricular systolic function   Post-operative Echo Findings: No paravalvular leak Normal left ventricular systolic function   _____________   Echo 09/02/20: IMPRESSIONS   1. Left ventricular ejection fraction, by estimation, is >75%. The left  ventricle has hyperdynamic function. The left ventricle has no regional  wall motion abnormalities.   2. Right ventricular systolic function is normal. The right ventricular  size is normal.   3. The mitral valve is degenerative. Trivial mitral valve regurgitation.  No evidence of mitral stenosis. Severe mitral annular calcification.   4. Post TAVR using 29 mm Evolut Pro Medtronic supra annlular valve.  Imaging is sub otpimal very trivial PVL not localized by SA images.  Gradients on non imaging pedoff SS notch images seem high. I measure peak  velocity 1.5 m/sec mean gradient 5 mmHg  peak 9 mmHg. This is in setting of very hyperdynamic LV fucnton with LVOT  velocity 1.3 m/sec. AVA 2.1 cm2 and DVI 0.5 . The aortic valve has been  repaired/replaced. Aortic valve regurgitation is trivial. No aortic  stenosis is present.   5. The inferior vena cava is normal in size with greater than 50%  respiratory variability, suggesting right atrial pressure of 3 mmHg.    ______________________  Stat limited echo 09/16/20 IMPRESSIONS   1. Left ventricular ejection fraction, by estimation, is 55 to 60%. The  left ventricle has normal function. The left ventricle has no regional  wall motion abnormalities. There is mild concentric left ventricular  hypertrophy. Indeterminate diastolic  filling due to E-A fusion.   2. Right ventricular systolic function is normal. The right ventricular  size is normal. Tricuspid regurgitation signal is inadequate for assessing  PA pressure.   3. The mitral valve is degenerative. No  evidence of mitral valve  regurgitation. No evidence of mitral stenosis. The mean mitral valve  gradient is 3.0 mmHg with average heart rate of 130 bpm.   4. The aortic valve has been repaired/replaced. Aortic valve  regurgitation is not visualized. There is a CoreValve-Evolut Pro  prosthetic (TAVR) valve present in the aortic position. Procedure Date:  09/01/2020. Echo findings are consistent with normal  structure and function of the aortic valve prosthesis.   5. The inferior vena cava is normal in size with greater than 50%  respiratory variability, suggesting right atrial pressure of 3 mmHg.   Comparison(s): Prior images reviewed side by side. The left ventricular systolic function is substantially lower compared to 2 weeks earlier, but remains in normal range.   _____________________  Echo 09/30/20 IMPRESSIONS  1. Left ventricular ejection fraction, by estimation, is 55 to 60%. The  left ventricle has normal function. The left ventricle has no regional  wall motion abnormalities. Left ventricular diastolic parameters are  indeterminate.   2. Right ventricular systolic function is normal. The right ventricular  size is normal. There is normal pulmonary artery systolic pressure. The  estimated right ventricular systolic pressure is 09.3 mmHg.   3. The mitral valve is normal in structure. Trivial mitral valve  regurgitation. No evidence of mitral stenosis.   4. The aortic valve has been repaired/replaced. Perivalvular Aortic valve  regurgitation is not visualized. No aortic stenosis is present. There is a  29 mm CoreValve-Evolut Pro prosthetic (TAVR) valve present in the aortic  position. Procedure Date:  09/01/2020. Echo findings are consistent with normal structure and  function of the aortic valve prosthesis. Aortic valve area, by VTI  measures 2.40 cm. Aortic valve mean gradient measures 7.0 mmHg. Aortic  valve Vmax measures 1.88 m/s. Diminsionless  index 0.49.   5. The  inferior vena cava is dilated in size with <50% respiratory  variability, suggesting right atrial pressure of 15 mmHg.   ______________________  Echo 08/26/21 IMPRESSIONS  1. Left ventricular ejection fraction, by estimation, is 60 to 65%. The  left ventricle has normal function. The left ventricle has no regional  wall motion abnormalities. There is mild concentric left ventricular  hypertrophy. Left ventricular diastolic  parameters were normal. Elevated left ventricular end-diastolic pressure.   2. Right ventricular systolic function is normal. The right ventricular  size is mildly enlarged. There is normal pulmonary artery systolic  pressure. The estimated right ventricular systolic pressure is 26.7 mmHg.   3. The mitral valve is degenerative. Trivial mitral valve regurgitation.  No evidence of mitral stenosis. Moderate mitral annular calcification.   4. The aortic valve has been repaired/replaced. There is a 29 mm stented  (TAVR) CoreValve Evolut Pro present in the aortic position.      Aortic valve regurgitation is not visualized. No aortic stenosis is  present. Aortic valve mean gradient measures 10.6 mmHg. Aortic valve Vmax  measures 2.21 m/s.   5. The inferior vena cava is normal in size with greater than 50%  respiratory variability, suggesting right atrial pressure of 3 mmHg.   6. Compared to echo dated 01/19/2021, Pulmonary Hypertenion has resolved.  THe mean AV TAVR gradient ha decreased from 13 to 10.51mHg.   ASSESSMENT & PLAN:   Severe AS s/p TAVR: echo today shows EF 65%, normally functioning TAVR with a mean gradient of 10.6 mm hg and no PVL. Pulmonary HTN has resolved. He has NYHA class II symptoms; he is mostly limited by body habitus and recent knee replacement.  Continue on Eliquis indefinitely.  Continue regular follow-up with Dr. CJanee Morn   HTN: BP well controlled today.  No changes made   CKD stage IIIb: creat baseline 1.3-1.82.  [place]  Hx of GI bleed: in the setting of O[place]& apirin. Referred to Dr. CBurt Knackfor consideration of watchman but pt not interested.  [facility]  Atrial flutter s/p DCCV: symptoms markedly improved since cardioversion.  Maintaining sinus rhythm by echocardiogram today.  Continue on amiodarone and beta-blocker.  Continue Eliquis 5 mg twice daily.  Samples were given today.   Knee osteoarthritis: Patient recently had right total knee replacement and anticipates getting left total knee replacement in the near future.  He is cleared for that from a cardiovascular standpoint.  Chronic diastolic heart failure: Appears euvolemic.  Continue current dose of  Lasix.  Medication Adjustments/Labs and Tests Ordered: Current medicines are reviewed at length with the patient today.  Concerns regarding medicines are outlined above.  Medication changes, Labs and Tests ordered today are listed in the Patient Instructions below. Patient Instructions  Medication Instructions:  Your physician recommends that you continue on your current medications as directed. Please refer to the Current Medication list given to you today.  *If you need a refill on your cardiac medications before your next appointment, please call your pharmacy*   Lab Work: None ordered   If you have labs (blood work) drawn today and your tests are completely normal, you will receive your results only by: [facility] (if you have MyChart) OR A paper copy in the mail If you have any lab test that is abnormal or we need to change your treatment, we will call you to review the results.   Testing/Procedures: None ordered    Follow-Up: Follow up as scheduled   :1}    Other Instructions None      Signed, Angelena Form, PA-C  08/26/2021 2:38 PM    [facility] [address], [place], [place]  [address] Phone: [phone];  Fax: [phone]

## 2021-09-21 ENCOUNTER — Other Ambulatory Visit: Payer: Self-pay | Admitting: Physician Assistant

## 2021-09-24 ENCOUNTER — Telehealth: Payer: Self-pay | Admitting: Internal Medicine

## 2021-09-24 ENCOUNTER — Inpatient Hospital Stay (HOSPITAL_COMMUNITY)
Admission: EM | Admit: 2021-09-24 | Discharge: 2021-10-04 | DRG: 291 | Disposition: A | Discharge status: Home Health | Payer: Medicare HMO | Attending: Internal Medicine | Admitting: Internal Medicine

## 2021-09-24 ENCOUNTER — Emergency Department (HOSPITAL_COMMUNITY): Payer: Medicare HMO

## 2021-09-24 ENCOUNTER — Encounter (HOSPITAL_COMMUNITY): Payer: Self-pay | Admitting: Emergency Medicine

## 2021-09-24 DIAGNOSIS — A419 Sepsis, unspecified organism: Secondary | ICD-10-CM

## 2021-09-24 DIAGNOSIS — J9621 Acute and chronic respiratory failure with hypoxia: Secondary | ICD-10-CM | POA: Diagnosis present

## 2021-09-24 DIAGNOSIS — Z882 Allergy status to sulfonamides status: Secondary | ICD-10-CM

## 2021-09-24 DIAGNOSIS — I130 Hypertensive heart and chronic kidney disease with heart failure and stage 1 through stage 4 chronic kidney disease, or unspecified chronic kidney disease: Principal | ICD-10-CM | POA: Diagnosis present

## 2021-09-24 DIAGNOSIS — I4892 Unspecified atrial flutter: Secondary | ICD-10-CM | POA: Diagnosis present

## 2021-09-24 DIAGNOSIS — Z888 Allergy status to other drugs, medicaments and biological substances status: Secondary | ICD-10-CM

## 2021-09-24 DIAGNOSIS — G894 Chronic pain syndrome: Secondary | ICD-10-CM | POA: Diagnosis present

## 2021-09-24 DIAGNOSIS — N179 Acute kidney failure, unspecified: Secondary | ICD-10-CM | POA: Diagnosis not present

## 2021-09-24 DIAGNOSIS — Z96651 Presence of right artificial knee joint: Secondary | ICD-10-CM | POA: Diagnosis present

## 2021-09-24 DIAGNOSIS — Z87891 Personal history of nicotine dependence: Secondary | ICD-10-CM

## 2021-09-24 DIAGNOSIS — Z79899 Other long term (current) drug therapy: Secondary | ICD-10-CM

## 2021-09-24 DIAGNOSIS — A4152 Sepsis due to Pseudomonas: Secondary | ICD-10-CM | POA: Diagnosis not present

## 2021-09-24 DIAGNOSIS — E1169 Type 2 diabetes mellitus with other specified complication: Secondary | ICD-10-CM | POA: Diagnosis present

## 2021-09-24 DIAGNOSIS — Z6841 Body Mass Index (BMI) 40.0 and over, adult: Secondary | ICD-10-CM

## 2021-09-24 DIAGNOSIS — Z7984 Long term (current) use of oral hypoglycemic drugs: Secondary | ICD-10-CM

## 2021-09-24 DIAGNOSIS — Z20822 Contact with and (suspected) exposure to covid-19: Secondary | ICD-10-CM | POA: Diagnosis present

## 2021-09-24 DIAGNOSIS — Z952 Presence of prosthetic heart valve: Secondary | ICD-10-CM

## 2021-09-24 DIAGNOSIS — J9601 Acute respiratory failure with hypoxia: Secondary | ICD-10-CM

## 2021-09-24 DIAGNOSIS — K219 Gastro-esophageal reflux disease without esophagitis: Secondary | ICD-10-CM | POA: Diagnosis present

## 2021-09-24 DIAGNOSIS — I080 Rheumatic disorders of both mitral and aortic valves: Secondary | ICD-10-CM | POA: Diagnosis present

## 2021-09-24 DIAGNOSIS — I5033 Acute on chronic diastolic (congestive) heart failure: Secondary | ICD-10-CM | POA: Diagnosis present

## 2021-09-24 DIAGNOSIS — I2721 Secondary pulmonary arterial hypertension: Secondary | ICD-10-CM | POA: Diagnosis present

## 2021-09-24 DIAGNOSIS — Z881 Allergy status to other antibiotic agents status: Secondary | ICD-10-CM

## 2021-09-24 DIAGNOSIS — I509 Heart failure, unspecified: Secondary | ICD-10-CM

## 2021-09-24 DIAGNOSIS — Z6839 Body mass index (BMI) 39.0-39.9, adult: Secondary | ICD-10-CM

## 2021-09-24 DIAGNOSIS — E1122 Type 2 diabetes mellitus with diabetic chronic kidney disease: Secondary | ICD-10-CM | POA: Diagnosis present

## 2021-09-24 DIAGNOSIS — I480 Paroxysmal atrial fibrillation: Secondary | ICD-10-CM | POA: Diagnosis present

## 2021-09-24 DIAGNOSIS — G4733 Obstructive sleep apnea (adult) (pediatric): Secondary | ICD-10-CM | POA: Diagnosis present

## 2021-09-24 DIAGNOSIS — D649 Anemia, unspecified: Secondary | ICD-10-CM | POA: Diagnosis present

## 2021-09-24 DIAGNOSIS — Z91018 Allergy to other foods: Secondary | ICD-10-CM

## 2021-09-24 DIAGNOSIS — Z7901 Long term (current) use of anticoagulants: Secondary | ICD-10-CM

## 2021-09-24 DIAGNOSIS — Z9981 Dependence on supplemental oxygen: Secondary | ICD-10-CM

## 2021-09-24 DIAGNOSIS — R0902 Hypoxemia: Secondary | ICD-10-CM

## 2021-09-24 DIAGNOSIS — J9691 Respiratory failure, unspecified with hypoxia: Secondary | ICD-10-CM | POA: Diagnosis present

## 2021-09-24 DIAGNOSIS — Z953 Presence of xenogenic heart valve: Secondary | ICD-10-CM

## 2021-09-24 DIAGNOSIS — Z7902 Long term (current) use of antithrombotics/antiplatelets: Secondary | ICD-10-CM

## 2021-09-24 DIAGNOSIS — G9341 Metabolic encephalopathy: Secondary | ICD-10-CM | POA: Diagnosis not present

## 2021-09-24 DIAGNOSIS — Z91013 Allergy to seafood: Secondary | ICD-10-CM

## 2021-09-24 DIAGNOSIS — N390 Urinary tract infection, site not specified: Secondary | ICD-10-CM | POA: Diagnosis not present

## 2021-09-24 DIAGNOSIS — J9622 Acute and chronic respiratory failure with hypercapnia: Secondary | ICD-10-CM | POA: Diagnosis not present

## 2021-09-24 DIAGNOSIS — I350 Nonrheumatic aortic (valve) stenosis: Secondary | ICD-10-CM

## 2021-09-24 DIAGNOSIS — D631 Anemia in chronic kidney disease: Secondary | ICD-10-CM | POA: Diagnosis present

## 2021-09-24 DIAGNOSIS — M1380 Other specified arthritis, unspecified site: Secondary | ICD-10-CM | POA: Diagnosis present

## 2021-09-24 DIAGNOSIS — N1831 Chronic kidney disease, stage 3a: Secondary | ICD-10-CM | POA: Diagnosis present

## 2021-09-24 DIAGNOSIS — F1120 Opioid dependence, uncomplicated: Secondary | ICD-10-CM | POA: Diagnosis present

## 2021-09-24 DIAGNOSIS — R079 Chest pain, unspecified: Secondary | ICD-10-CM | POA: Diagnosis present

## 2021-09-24 DIAGNOSIS — I2510 Atherosclerotic heart disease of native coronary artery without angina pectoris: Secondary | ICD-10-CM | POA: Diagnosis present

## 2021-09-24 DIAGNOSIS — R6520 Severe sepsis without septic shock: Secondary | ICD-10-CM | POA: Diagnosis not present

## 2021-09-24 DIAGNOSIS — E669 Obesity, unspecified: Secondary | ICD-10-CM | POA: Diagnosis present

## 2021-09-24 LAB — CBC
HCT: 32.5 % — ABNORMAL LOW (ref 39.0–52.0)
Hemoglobin: 9.8 g/dL — ABNORMAL LOW (ref 13.0–17.0)
MCH: 26.7 pg (ref 26.0–34.0)
MCHC: 30.2 g/dL (ref 30.0–36.0)
MCV: 88.6 fL (ref 80.0–100.0)
Platelets: 222 10*3/uL (ref 150–400)
RBC: 3.67 MIL/uL — ABNORMAL LOW (ref 4.22–5.81)
RDW: 15.5 % (ref 11.5–15.5)
WBC: 7.4 10*3/uL (ref 4.0–10.5)
nRBC: 0 % (ref 0.0–0.2)

## 2021-09-24 LAB — BASIC METABOLIC PANEL
Anion gap: 9 (ref 5–15)
BUN: 21 mg/dL (ref 8–23)
CO2: 28 mmol/L (ref 22–32)
Calcium: 9.4 mg/dL (ref 8.9–10.3)
Chloride: 99 mmol/L (ref 98–111)
Creatinine, Ser: 1.63 mg/dL — ABNORMAL HIGH (ref 0.61–1.24)
GFR, Estimated: 46 mL/min — ABNORMAL LOW (ref >60)
Glucose, Bld: 117 mg/dL — ABNORMAL HIGH (ref 70–99)
Potassium: 4.2 mmol/L (ref 3.5–5.1)
Sodium: 136 mmol/L (ref 135–145)

## 2021-09-24 NOTE — Telephone Encounter (Signed)
   Pt said he thinks he has RSV, he saw it on TV and he has similar symptoms. He asked if Dr. Izora Ribas will prescribe anything for him.

## 2021-09-24 NOTE — ED Triage Notes (Signed)
Patient complains of shortness of breath that started several days ago and is unchanged since then. Patient states shortness of breath is improved by rest. Denies pain, denies diarrhea, denies nausea and vomiting. SpO2 94% on room air. Patient alert, oriented, and in no apparent distress at this time.

## 2021-09-24 NOTE — Telephone Encounter (Signed)
Called patient about his message. Encouraged patient that he should call his PCP. Patient stated he already call his PCP and he directed patient to go to the urgent care.

## 2021-09-25 ENCOUNTER — Encounter (HOSPITAL_COMMUNITY): Payer: Self-pay | Admitting: Internal Medicine

## 2021-09-25 ENCOUNTER — Other Ambulatory Visit: Payer: Self-pay

## 2021-09-25 DIAGNOSIS — Z6839 Body mass index (BMI) 39.0-39.9, adult: Secondary | ICD-10-CM | POA: Diagnosis not present

## 2021-09-25 DIAGNOSIS — M1380 Other specified arthritis, unspecified site: Secondary | ICD-10-CM | POA: Diagnosis present

## 2021-09-25 DIAGNOSIS — G4733 Obstructive sleep apnea (adult) (pediatric): Secondary | ICD-10-CM

## 2021-09-25 DIAGNOSIS — I2510 Atherosclerotic heart disease of native coronary artery without angina pectoris: Secondary | ICD-10-CM | POA: Diagnosis present

## 2021-09-25 DIAGNOSIS — E1169 Type 2 diabetes mellitus with other specified complication: Secondary | ICD-10-CM | POA: Diagnosis not present

## 2021-09-25 DIAGNOSIS — J9601 Acute respiratory failure with hypoxia: Secondary | ICD-10-CM | POA: Diagnosis not present

## 2021-09-25 DIAGNOSIS — I480 Paroxysmal atrial fibrillation: Secondary | ICD-10-CM | POA: Diagnosis present

## 2021-09-25 DIAGNOSIS — R6520 Severe sepsis without septic shock: Secondary | ICD-10-CM | POA: Diagnosis not present

## 2021-09-25 DIAGNOSIS — Z20822 Contact with and (suspected) exposure to covid-19: Secondary | ICD-10-CM | POA: Diagnosis present

## 2021-09-25 DIAGNOSIS — N390 Urinary tract infection, site not specified: Secondary | ICD-10-CM | POA: Diagnosis not present

## 2021-09-25 DIAGNOSIS — G894 Chronic pain syndrome: Secondary | ICD-10-CM | POA: Diagnosis not present

## 2021-09-25 DIAGNOSIS — G9341 Metabolic encephalopathy: Secondary | ICD-10-CM | POA: Diagnosis not present

## 2021-09-25 DIAGNOSIS — J9621 Acute and chronic respiratory failure with hypoxia: Secondary | ICD-10-CM | POA: Diagnosis present

## 2021-09-25 DIAGNOSIS — I4892 Unspecified atrial flutter: Secondary | ICD-10-CM | POA: Diagnosis present

## 2021-09-25 DIAGNOSIS — Z7901 Long term (current) use of anticoagulants: Secondary | ICD-10-CM | POA: Diagnosis not present

## 2021-09-25 DIAGNOSIS — I509 Heart failure, unspecified: Secondary | ICD-10-CM | POA: Diagnosis present

## 2021-09-25 DIAGNOSIS — R079 Chest pain, unspecified: Secondary | ICD-10-CM | POA: Diagnosis not present

## 2021-09-25 DIAGNOSIS — J9622 Acute and chronic respiratory failure with hypercapnia: Secondary | ICD-10-CM | POA: Diagnosis not present

## 2021-09-25 DIAGNOSIS — F1120 Opioid dependence, uncomplicated: Secondary | ICD-10-CM | POA: Diagnosis present

## 2021-09-25 DIAGNOSIS — I2721 Secondary pulmonary arterial hypertension: Secondary | ICD-10-CM | POA: Diagnosis present

## 2021-09-25 DIAGNOSIS — D649 Anemia, unspecified: Secondary | ICD-10-CM

## 2021-09-25 DIAGNOSIS — I080 Rheumatic disorders of both mitral and aortic valves: Secondary | ICD-10-CM | POA: Diagnosis present

## 2021-09-25 DIAGNOSIS — A419 Sepsis, unspecified organism: Secondary | ICD-10-CM | POA: Diagnosis not present

## 2021-09-25 DIAGNOSIS — E1122 Type 2 diabetes mellitus with diabetic chronic kidney disease: Secondary | ICD-10-CM | POA: Diagnosis present

## 2021-09-25 DIAGNOSIS — N1831 Chronic kidney disease, stage 3a: Secondary | ICD-10-CM | POA: Diagnosis present

## 2021-09-25 DIAGNOSIS — I5033 Acute on chronic diastolic (congestive) heart failure: Secondary | ICD-10-CM | POA: Diagnosis present

## 2021-09-25 DIAGNOSIS — Z9989 Dependence on other enabling machines and devices: Secondary | ICD-10-CM

## 2021-09-25 DIAGNOSIS — J9691 Respiratory failure, unspecified with hypoxia: Secondary | ICD-10-CM | POA: Diagnosis present

## 2021-09-25 DIAGNOSIS — Z6841 Body Mass Index (BMI) 40.0 and over, adult: Secondary | ICD-10-CM | POA: Diagnosis not present

## 2021-09-25 DIAGNOSIS — N179 Acute kidney failure, unspecified: Secondary | ICD-10-CM | POA: Diagnosis not present

## 2021-09-25 DIAGNOSIS — Z952 Presence of prosthetic heart valve: Secondary | ICD-10-CM | POA: Diagnosis not present

## 2021-09-25 DIAGNOSIS — A4152 Sepsis due to Pseudomonas: Secondary | ICD-10-CM | POA: Diagnosis not present

## 2021-09-25 DIAGNOSIS — E669 Obesity, unspecified: Secondary | ICD-10-CM

## 2021-09-25 DIAGNOSIS — D631 Anemia in chronic kidney disease: Secondary | ICD-10-CM | POA: Diagnosis present

## 2021-09-25 DIAGNOSIS — I130 Hypertensive heart and chronic kidney disease with heart failure and stage 1 through stage 4 chronic kidney disease, or unspecified chronic kidney disease: Secondary | ICD-10-CM | POA: Diagnosis present

## 2021-09-25 DIAGNOSIS — K219 Gastro-esophageal reflux disease without esophagitis: Secondary | ICD-10-CM | POA: Diagnosis present

## 2021-09-25 DIAGNOSIS — J9602 Acute respiratory failure with hypercapnia: Secondary | ICD-10-CM | POA: Diagnosis not present

## 2021-09-25 LAB — URINALYSIS, ROUTINE W REFLEX MICROSCOPIC
Bacteria, UA: NONE SEEN
Bilirubin Urine: NEGATIVE
Glucose, UA: NEGATIVE mg/dL
Ketones, ur: 5 mg/dL — AB
Leukocytes,Ua: NEGATIVE
Nitrite: NEGATIVE
Protein, ur: 30 mg/dL — AB
Specific Gravity, Urine: 1.006 (ref 1.005–1.030)
pH: 5 (ref 5.0–8.0)

## 2021-09-25 LAB — I-STAT ARTERIAL BLOOD GAS, ED
Acid-Base Excess: 2 mmol/L (ref 0.0–2.0)
Bicarbonate: 28.1 mmol/L — ABNORMAL HIGH (ref 20.0–28.0)
Calcium, Ion: 1.23 mmol/L (ref 1.15–1.40)
HCT: 37 % — ABNORMAL LOW (ref 39.0–52.0)
Hemoglobin: 12.6 g/dL — ABNORMAL LOW (ref 13.0–17.0)
O2 Saturation: 96 %
Patient temperature: 98.7
Potassium: 4.2 mmol/L (ref 3.5–5.1)
Sodium: 138 mmol/L (ref 135–145)
TCO2: 29 mmol/L (ref 22–32)
pCO2 arterial: 47.5 mmHg (ref 32.0–48.0)
pH, Arterial: 7.38 (ref 7.350–7.450)
pO2, Arterial: 84 mmHg (ref 83.0–108.0)

## 2021-09-25 LAB — BRAIN NATRIURETIC PEPTIDE: B Natriuretic Peptide: 566.6 pg/mL — ABNORMAL HIGH (ref 0.0–100.0)

## 2021-09-25 LAB — GLUCOSE, CAPILLARY: Glucose-Capillary: 130 mg/dL — ABNORMAL HIGH (ref 70–99)

## 2021-09-25 LAB — RESP PANEL BY RT-PCR (FLU A&B, COVID) ARPGX2
Influenza A by PCR: NEGATIVE
Influenza B by PCR: NEGATIVE
SARS Coronavirus 2 by RT PCR: NEGATIVE

## 2021-09-25 LAB — TROPONIN I (HIGH SENSITIVITY)
Troponin I (High Sensitivity): 13 ng/L (ref <18)
Troponin I (High Sensitivity): 17 ng/L (ref <18)

## 2021-09-25 LAB — HIV ANTIBODY (ROUTINE TESTING W REFLEX): HIV Screen 4th Generation wRfx: NONREACTIVE

## 2021-09-25 LAB — CBG MONITORING, ED: Glucose-Capillary: 159 mg/dL — ABNORMAL HIGH (ref 70–99)

## 2021-09-25 MED ORDER — ACETAMINOPHEN 325 MG PO TABS
650.0000 mg | ORAL_TABLET | Freq: Four times a day (QID) | ORAL | Status: DC | PRN
  Administered 2021-09-25 – 2021-10-03 (×4): 650 mg via ORAL
  Filled 2021-09-25 (×4): qty 2

## 2021-09-25 MED ORDER — ONDANSETRON HCL 4 MG PO TABS: 4.0000 mg | ORAL_TABLET | Freq: Four times a day (QID) | ORAL | Status: DC | PRN

## 2021-09-25 MED ORDER — HYDRALAZINE HCL 25 MG PO TABS
25.0000 mg | ORAL_TABLET | Freq: Three times a day (TID) | ORAL | Status: DC
  Administered 2021-09-25 – 2021-09-27 (×5): 25 mg via ORAL
  Filled 2021-09-25 (×7): qty 1

## 2021-09-25 MED ORDER — FUROSEMIDE 10 MG/ML IJ SOLN
40.0000 mg | Freq: Two times a day (BID) | INTRAMUSCULAR | Status: DC
  Administered 2021-09-25 – 2021-09-26 (×4): 40 mg via INTRAVENOUS
  Filled 2021-09-25 (×4): qty 4

## 2021-09-25 MED ORDER — APIXABAN 5 MG PO TABS
5.0000 mg | ORAL_TABLET | Freq: Two times a day (BID) | ORAL | Status: DC
  Administered 2021-09-25 – 2021-10-04 (×19): 5 mg via ORAL
  Filled 2021-09-25 (×19): qty 1

## 2021-09-25 MED ORDER — ALBUTEROL SULFATE (2.5 MG/3ML) 0.083% IN NEBU
2.5000 mg | INHALATION_SOLUTION | Freq: Four times a day (QID) | RESPIRATORY_TRACT | Status: DC | PRN
  Administered 2021-09-29: 11:00:00 2.5 mg via RESPIRATORY_TRACT
  Filled 2021-09-25: qty 3

## 2021-09-25 MED ORDER — CLOPIDOGREL BISULFATE 75 MG PO TABS
75.0000 mg | ORAL_TABLET | Freq: Every morning | ORAL | Status: DC
  Administered 2021-09-25 – 2021-10-04 (×10): 75 mg via ORAL
  Filled 2021-09-25 (×10): qty 1

## 2021-09-25 MED ORDER — PANTOPRAZOLE SODIUM 40 MG PO TBEC
40.0000 mg | DELAYED_RELEASE_TABLET | Freq: Two times a day (BID) | ORAL | Status: DC
  Administered 2021-09-25 – 2021-10-04 (×19): 40 mg via ORAL
  Filled 2021-09-25 (×20): qty 1

## 2021-09-25 MED ORDER — GABAPENTIN 300 MG PO CAPS
600.0000 mg | ORAL_CAPSULE | Freq: Two times a day (BID) | ORAL | Status: DC
  Administered 2021-09-25 – 2021-10-04 (×18): 600 mg via ORAL
  Filled 2021-09-25 (×18): qty 2

## 2021-09-25 MED ORDER — METOPROLOL TARTRATE 50 MG PO TABS
50.0000 mg | ORAL_TABLET | Freq: Two times a day (BID) | ORAL | Status: DC
  Administered 2021-09-25 – 2021-09-29 (×9): 50 mg via ORAL
  Filled 2021-09-25 (×8): qty 1
  Filled 2021-09-25: qty 2

## 2021-09-25 MED ORDER — DILTIAZEM HCL ER COATED BEADS 240 MG PO CP24
240.0000 mg | ORAL_CAPSULE | Freq: Every day | ORAL | Status: DC
  Administered 2021-09-25 – 2021-09-27 (×3): 240 mg via ORAL
  Filled 2021-09-25 (×3): qty 1

## 2021-09-25 MED ORDER — ACETAMINOPHEN 650 MG RE SUPP
650.0000 mg | Freq: Four times a day (QID) | RECTAL | Status: DC | PRN
  Administered 2021-09-29: 14:00:00 650 mg via RECTAL
  Filled 2021-09-25: qty 1

## 2021-09-25 MED ORDER — FUROSEMIDE 10 MG/ML IJ SOLN: 40.0000 mg | Freq: Two times a day (BID) | INTRAMUSCULAR | Status: DC

## 2021-09-25 MED ORDER — LATANOPROST 0.005 % OP SOLN
1.0000 [drp] | Freq: Every day | OPHTHALMIC | Status: DC
  Administered 2021-09-25 – 2021-10-03 (×9): 1 [drp] via OPHTHALMIC
  Filled 2021-09-25 (×2): qty 2.5

## 2021-09-25 MED ORDER — ONDANSETRON HCL 4 MG/2ML IJ SOLN: 4.0000 mg | Freq: Four times a day (QID) | INTRAMUSCULAR | Status: DC | PRN

## 2021-09-25 MED ORDER — BACITRACIN-NEOMYCIN-POLYMYXIN OINTMENT TUBE
1.0000 "application " | TOPICAL_OINTMENT | Freq: Two times a day (BID) | CUTANEOUS | Status: DC | PRN
  Filled 2021-09-25 (×2): qty 14

## 2021-09-25 MED ORDER — OXYCODONE HCL ER 10 MG PO T12A
10.0000 mg | EXTENDED_RELEASE_TABLET | Freq: Two times a day (BID) | ORAL | Status: DC
  Administered 2021-09-25 – 2021-10-04 (×16): 10 mg via ORAL
  Filled 2021-09-25 (×20): qty 1

## 2021-09-25 MED ORDER — ROSUVASTATIN CALCIUM 20 MG PO TABS
20.0000 mg | ORAL_TABLET | Freq: Every day | ORAL | Status: DC
  Administered 2021-09-25 – 2021-10-04 (×10): 20 mg via ORAL
  Filled 2021-09-25 (×10): qty 1

## 2021-09-25 MED ORDER — AMIODARONE HCL 200 MG PO TABS
200.0000 mg | ORAL_TABLET | Freq: Every day | ORAL | Status: DC
  Administered 2021-09-25 – 2021-10-04 (×10): 200 mg via ORAL
  Filled 2021-09-25 (×10): qty 1

## 2021-09-25 MED ORDER — ALUM & MAG HYDROXIDE-SIMETH 200-200-20 MG/5ML PO SUSP
30.0000 mL | Freq: Four times a day (QID) | ORAL | Status: DC | PRN
  Administered 2021-09-25 – 2021-10-04 (×2): 30 mL via ORAL
  Filled 2021-09-25 (×2): qty 30

## 2021-09-25 MED ORDER — FUROSEMIDE 10 MG/ML IJ SOLN: 40.0000 mg | Freq: Once | INTRAMUSCULAR | Status: DC

## 2021-09-25 MED ORDER — HYDROCODONE-ACETAMINOPHEN 10-325 MG PO TABS
1.0000 | ORAL_TABLET | Freq: Four times a day (QID) | ORAL | Status: DC | PRN
  Administered 2021-09-25 – 2021-10-04 (×10): 1 via ORAL
  Filled 2021-09-25 (×10): qty 1

## 2021-09-25 MED ORDER — SODIUM CHLORIDE 0.9% FLUSH
3.0000 mL | Freq: Two times a day (BID) | INTRAVENOUS | Status: DC
  Administered 2021-09-25 – 2021-10-04 (×15): 3 mL via INTRAVENOUS

## 2021-09-25 MED ORDER — FUROSEMIDE 10 MG/ML IJ SOLN
40.0000 mg | INTRAMUSCULAR | Status: AC
  Administered 2021-09-25: 40 mg via INTRAVENOUS
  Filled 2021-09-25: qty 4

## 2021-09-25 NOTE — ED Notes (Signed)
Pt was at 82 % on room air put pt on 3 liters went up 95% Triage Nurse was made aware

## 2021-09-25 NOTE — ED Notes (Signed)
Pt taken off of bipap to trial to see how pt does per Dr Katrinka Blazing. Placed pt on 4L 02 per [place] to start to see how pt does.

## 2021-09-25 NOTE — ED Provider Notes (Signed)
[facility] EMERGENCY DEPARTMENT Provider Note   CSN: [id number] Arrival date & time: 09/24/21  1609     History Chief Complaint  Patient presents with   Shortness of Breath    Lawrence Rose is a 65 y.o. male.  Pt presents to the ED today with sob.  Pt said he has been sob for the past few days.  Pt has a hx of CHF, but it had been doing well.  At his last cardiology visit in November, his EF was 60-65%.  Pt is unable to give much hx due to severe sob.  RA O2 sat 82% on RA.  He was put on 3L and sats are in the mid-90s.  Pt still very sob.      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 Angelena Form and Dr. Cyndia Bent.    Severe aortic stenosis    Sleep apnea    wears CPAP every night    Patient Active Problem List   Diagnosis Date Noted   Atrial fibrillation status post cardioversion ([place]) 01/19/2021   (HFpEF) heart failure with preserved ejection fraction ([place]) 01/19/2021   Asymptomatic hypertensive urgency 01/19/2021   HFrEF (heart failure with reduced ejection fraction) ([place]) 01/19/2021   Therapeutic drug monitoring 12/24/2020   Hypertension associated with diabetes ([place]) 12/24/2020   Morbid obesity ([place]) 10/29/2020   Occult blood positive stool    GI bleed 09/30/2020   Chronic anticoagulation 09/30/2020   Atrial flutter ([place]) 09/30/2020   Diabetes mellitus type 2 in obese ([place]) 09/30/2020   Anemia 09/01/2020   Pulmonary hypertension, unspecified ([place]) 09/01/2020   S/P TAVR (transcatheter aortic  valve replacement) 09/01/2020   Severe aortic stenosis    Nonrheumatic mitral valve stenosis    CKD (chronic kidney disease) stage 3, GFR 30-59 ml/min (H[facility]) 08/17/2020   OSA on CPAP 08/17/2020   Obesity, Class III, BMI 40-49.9 (morbid obesity) ([place]) 08/17/2020    Past Surgical History:  Procedure Laterality Date   BACK SURGERY     CARDIOVERSION  01/18/2021   CARDIOVERSION N/A 01/18/2021   Procedure: CARDIOVERSION;  Surgeon: Werner Lean, MD;  Location: [place];  Service: Cardiovascular;  Laterality: N/A;   ESOPHAGOGASTRODUODENOSCOPY (EGD) WITH PROPOFOL N/A 10/01/2020   Procedure: ESOPHAGOGASTRODUODENOSCOPY (EGD) WITH PROPOFOL;  Surgeon: [name], MD;  Location: [facility];  Service: Endoscopy;  Laterality: N/A;   RIGHT/LEFT HEART CATH AND CORONARY ANGIOGRAPHY N/A 08/18/2020   Procedure: RIGHT/LEFT HEART CATH AND CORONARY ANGIOGRAPHY;  Surgeon: [name], MD;  Location: [place] [facility];  Service: Cardiovascular;  Laterality: N/A;   TEE WITHOUT CARDIOVERSION N/A 08/21/2020   Procedure: TRANSESOPHAGEAL ECHOCARDIOGRAM (TEE);  Surgeon: [name], MD;  Location: [facility]COPY;  Service: Cardiovascular;  Laterality: N/A;   TEE WITHOUT CARDIOVERSION N/A 09/01/2020   Procedure: TRANSESOPHAGEAL ECHOCARDIOGRAM (TEE);  Surgeon: [name], MD;  Location: [place] [facility];  Service: Open Heart Surgery;  Laterality: N/A;   TRANSCATHETER AORTIC VALVE REPLACEMENT, TRANSFEMORAL N/A 09/01/2020   Procedure: TRANSCATHETER AORTIC VALVE REPLACEMENT, TRANSFEMORAL;  Surgeon: [name], MD;  Location: [place] [facility];  Service: Open Heart Surgery;  Laterality: N/A;       No family history on file.  Social History   Tobacco Use   Smoking status: Former   Smokeless tobacco: Never   Tobacco comments:    per patient quit over 30 years ago  Vaping Use   Vaping Use: Never used  Substance Use Topics   Alcohol use: No   Drug use: Not  Currently    Home Medications Prior to Admission medications   Medication Sig Start Date End Date Taking? Authorizing Provider  albuterol (VENTOLIN HFA) 108 (90 Base) MCG/ACT inhaler Inhale 2 puffs into the lungs every 4 (four) hours as needed for wheezing or shortness of breath. 02/26/21   Laurin Coder, MD  amiodarone (PACERONE) 200 MG tablet Take 1 tablet by mouth once daily 09/22/21   Rudean Haskell A, MD  amoxicillin (AMOXIL) 500 MG capsule Take 2000 mg at least 1 hour prior to any dental procedure. 10/30/20   Chandrasekhar, Terisa Starr, MD  apixaban (ELIQUIS) 5 MG TABS tablet Take 1 tablet (5 mg total) by mouth 2 (two) times daily. 05/21/21   Lendon Colonel, NP  Blood Glucose Monitoring Suppl (FIFTY50 GLUCOSE METER 2.0) w/Device KIT See admin instructions. 05/16/19   [provider]  Cholecalciferol 25 MCG (1000 UT) tablet Take 1,000 Units by mouth at bedtime.     [provider]  diltiazem (CARDIZEM CD) 240 MG 24 hr capsule Take 1 capsule (240 mg total) by mouth daily. 05/21/21   Lendon Colonel, NP  furosemide (LASIX) 40 MG tablet Take 1 tablet (40 mg total) by mouth daily. 03/12/21   Chandrasekhar, Lyda Kalata A, MD  gabapentin (NEURONTIN) 300 MG capsule Take 600 mg by mouth 2 (two) times daily.  08/12/20   [provider]  hydrALAZINE (APRESOLINE) 25 MG tablet Take 1 tablet (25 mg total) by mouth every 8 (eight) hours. 05/21/21   Lendon Colonel, NP  HYDROcodone-acetaminophen (NORCO) 10-325 MG tablet Take 1 tablet by mouth every 6 (six) hours as needed for moderate pain.  08/04/20   [provider]  Iron-Vitamins (GERITOL PO) Take 1 tablet by mouth daily.    [provider]  latanoprost (XALATAN) 0.005 % ophthalmic solution Place 1 drop into both eyes at bedtime.  07/27/20   [provider]  lisinopril (ZESTRIL) 40 MG tablet Take 1 tablet by mouth daily. 07/13/21   [provider]  metFORMIN (GLUCOPHAGE) 1000 MG tablet  Take 1,000 mg by mouth 2 (two) times daily. 07/10/20   [provider]  metoprolol tartrate (LOPRESSOR) 50 MG tablet Take 1 tablet (50 mg total) by mouth 2 (two) times daily. 10/14/20   Eileen Stanford, PA-C  Multiple Vitamin (MULTIVITAMIN WITH MINERALS) TABS tablet Take 1 tablet by mouth at bedtime.     [provider]  neomycin-bacitracin-polymyxin (NEOSPORIN) [phone] ointment Apply 1 application topically 2 (two) times daily as needed (itching).    [provider]  oxyCODONE ER (XTAMPZA ER) 9 MG C12A Take 9 mg by mouth every 12 (twelve) hours.    [provider]  pantoprazole (PROTONIX) 40 MG tablet Take 40 mg by mouth 2 (two) times daily. 05/26/20   [provider]  PRESCRIPTION MEDICATION Inhale into the lungs at bedtime. CPAP  [provider]  rosuvastatin (CRESTOR) 20 MG tablet Take 20 mg by mouth daily.  05/25/20   [provider]    Allergies    Cocoa, Sulfamethoxazole-trimethoprim, Chocolate, Citrus, Fish allergy, Shellfish allergy, Sulfa antibiotics, and Tetracycline  Review of Systems   Review of Systems  Respiratory:  Positive for shortness of breath.   All other systems reviewed and are negative.  Physical Exam Updated Vital Signs BP (!) 153/64   Pulse 66   Temp 98.7 F (37.1 C)   Resp 20   SpO2 96%   Physical Exam Vitals and nursing note reviewed.  Constitutional:      General: He is in acute distress.     Appearance: He is well-developed. He is obese. He is ill-appearing.  HENT:     Head: Normocephalic and atraumatic.     Mouth/Throat:     Mouth: Mucous membranes are moist.  Eyes:     Extraocular Movements: Extraocular movements intact.     Pupils: Pupils are equal, round, and reactive to light.  Cardiovascular:     Rate and Rhythm: Normal rate and regular rhythm.  Pulmonary:     Effort: Tachypnea present.     Breath sounds: Rhonchi present.  Abdominal:     General: Bowel sounds are  normal.     Palpations: Abdomen is soft.  Musculoskeletal:        General: Normal range of motion.     Cervical back: Normal range of motion and neck supple.     Right lower leg: Edema present.     Left lower leg: Edema present.  Skin:    General: Skin is warm.     Capillary Refill: Capillary refill takes less than 2 seconds.  Neurological:     General: No focal deficit present.     Mental Status: He is alert and oriented to person, place, and time.  Psychiatric:        Mood and Affect: Mood normal.        Behavior: Behavior normal.    ED Results / Procedures / Treatments   Labs (all labs ordered are listed, but only abnormal results are displayed) Labs Reviewed  BASIC METABOLIC PANEL - Abnormal; Notable for the following components:      Result Value   Glucose, Bld 117 (*)    Creatinine, Ser 1.63 (*)    GFR, Estimated 46 (*)    All other components within normal limits  CBC - Abnormal; Notable for the following components:   RBC 3.67 (*)    Hemoglobin 9.8 (*)    HCT 32.5 (*)    All other components within normal limits  BRAIN NATRIURETIC PEPTIDE - Abnormal; Notable for the following components:   B Natriuretic Peptide 566.6 (*)    All other components within normal limits  I-STAT ARTERIAL BLOOD GAS, ED - Abnormal; Notable for the following components:   Bicarbonate 28.1 (*)    HCT 37.0 (*)    Hemoglobin 12.6 (*)    All other components within normal limits  RESP PANEL BY RT-PCR (FLU A&B, COVID) ARPGX2  TROPONIN I (HIGH SENSITIVITY)  TROPONIN I (HIGH SENSITIVITY)    EKG EKG Interpretation  Date/Time:  Saturday September 25 2021 03:11:12 EST Ventricular Rate:  78 PR Interval:  165 QRS Duration: 94 QT Interval:  415 QTC Calculation: 473 R Axis:   48 Text Interpretation: Sinus rhythm Right atrial enlargement No significant change since last tracing Confirmed by Isla Pence [id number]) on 09/25/2021 3:15:59 AM  Radiology DG Chest 2 View  Result Date:  09/24/2021 CLINICAL DATA:  Short of breath for several days EXAM: CHEST - 2 VIEW COMPARISON:  09/24/2021 at 2:53 p.m. FINDINGS: Frontal and lateral views of the chest demonstrate postsurgical changes from aortic valve prosthesis. Cardiac silhouette is mildly enlarged. Since the exam performed earlier today, there has been interval progression of the bibasilar consolidation, with development of diffuse interstitial prominence. There are stable small bilateral pleural effusions. No pneumothorax. IMPRESSION: 1. Findings consistent with progressive congestive heart failure, with worsening volume status since earlier today. Electronically Signed   By: Randa Ngo M.D.   On: 09/24/2021 18:14    Procedures Procedures   Medications Ordered in ED Medications  furosemide (LASIX) injection 40 mg (40 mg Intravenous Given 09/25/21 0323)    ED Course  I have reviewed the triage vital signs and the nursing notes.  Pertinent labs & imaging results that were available during my care of the patient were reviewed by me and considered in my medical decision making (see chart for details).    MDM Rules/Calculators/A&P                           Pt put on bipap due to severe sob.  RR is improved.    Pt has worsening CHF on CXR.  He is given lasix 40 mg IV.  Resp status is improving, but is still far from baseline.  Pt d/w Dr. Marlowe Sax (triad) for admission.  CRITICAL CARE Performed by: Isla Pence   Total critical care time: 30 minutes  Critical care time was exclusive of separately billable procedures and treating other patients.  Critical care was necessary to treat or prevent imminent or life-threatening deterioration.  Critical care was time spent personally by me on the following activities: development of treatment plan with patient and/or surrogate as well as nursing, discussions with consultants, evaluation of patient's response to treatment, examination of patient, obtaining history from  patient or surrogate, ordering and performing treatments and interventions, ordering and review of laboratory studies, ordering and review of radiographic studies, pulse oximetry and re-evaluation of patient's condition.    Final Clinical Impression(s) / ED Diagnoses Final diagnoses:  Acute on chronic congestive heart failure, unspecified heart failure type ([place])  Acute respiratory failure with hypoxia [facility])    Rx / DC Orders ED Discharge Orders     None        Isla Pence, MD 09/25/21 [phone]

## 2021-09-25 NOTE — H&P (Addendum)
History and Physical    [name] ERQ:[id number] DOB: 14-Jan-1956 DOA: 09/24/2021  Referring MD/NP/PA: Shela Leff, MD PCP: [name], [name], MD  Patient coming from: Home  Chief Complaint: Shortness of breath  I have personally briefly reviewed patient's old medical records in [place]   HPI: Lawrence Rose is a 65 y.o. male with medical history significant of HTN,  HFpEF 66 -65%, severe AS s/p TAVR 08/2020, paroxysmal atrial fibrillation/flutters /p DCCV on chronic anticoagulation, DM type II, CKD stage III, right TKA 07/2021, OSA on CPAP, and prior GI bleed presents with complaints of progressively worsening shortness of breath over the last 2-3 days.  History is limited due to patient's respiratory distress as he is currently on BiPAP.  Patient notes that he had been needing to catch his breath more frequently with doing normal activities.  Even walking 10 to 15 feet and he reports needing to stop and rest.  His oxygenation at home never dropped to his knowledge.  He had seen commercials about RSV and questioned if that was the cause of his symptoms and had notified his cardiologist.  Denies having any significant fever, nausea, vomiting, abdominal pain, dysuria, or noticing any leg swelling.  Came to the emergency department for further evaluation due to his symptoms.  While in the emergency department patient reported that he was unable to to lay back due to feeling like he was smothering.  Other associated symptoms of nonproductive cough, 1 episode of substernal chest pain that quickly self resolved, urinary frequency, back pain, and left knee pain.  Patient had just recently had his right knee replacement back in October, but states that he has been getting around and not sitting for prolonged periods in time.  He has been taking his Eliquis as prescribed, but states that he was told to stop Lasix at some point.  Per records it seems patient's GFR had decreased with  increasing creatinine to 1.85 in 07/2021 for which may have been when he was advised to hold Lasix.  He was last admitted for heart failure exacerbation back in April of this year and had previously been on furosemide 40 mg daily.  ED Course: Upon admission into the emergency department patient was noted to be afebrile, respirations 17-29, blood pressures 130/63- 173/77, heart rates 61-90, and O2 saturations as low as 82% on room air for which he was placed on 3 L of nasal cannula oxygen with improvement to 95%.  Arterial blood gas had normal pH of 7.38, PCO2 47.5, and PO2 84.  Labs from 12/9 significant for hemoglobin 9.8, BUN 21, creatinine 1.63, BNP 566.6, and high-sensitivity troponins negative x2.  Chest x-ray consistent with progressive heart failure with worsening volume status.  Influenza and COVID-19 screening were negative.  Patient had been given Lasix 40 mg IV x1 dose.  Review of Systems  Constitutional:  Positive for malaise/fatigue. Negative for fever.  HENT:  Negative for nosebleeds.   Eyes:  Negative for photophobia and pain.  Respiratory:  Positive for cough and shortness of breath. Negative for sputum production.   Cardiovascular:  Positive for chest pain and orthopnea. Negative for leg swelling.  Gastrointestinal:  Negative for abdominal pain, nausea and vomiting.  Genitourinary:  Positive for frequency. Negative for dysuria.  Musculoskeletal:  Positive for back pain and joint pain.  Neurological:  Negative for loss of consciousness.  Psychiatric/Behavioral:  Negative for substance abuse.    Past Medical History:  Diagnosis Date   Acid reflux  Allergic arthritis, hand    per patient, both hands   Anemia    Apnea    Asthma    Carpal tunnel syndrome    per patient, both hands   Chronic back pain    Chronic kidney disease    Chronic knee pain    Diabetes mellitus without complication ([place])    Dysrhythmia    GI bleeding 09/2020   Hypertension    S/P TAVR  (transcatheter aortic valve replacement) 09/01/2020   s/p TAVR with a 29 mm Medtronic Evolut Pro+ via the TF approach by Dr. Angelena Form and Dr. Cyndia Bent.    Severe aortic stenosis    Sleep apnea    wears CPAP every night    Past Surgical History:  Procedure Laterality Date   BACK SURGERY     CARDIOVERSION  01/18/2021   CARDIOVERSION N/A 01/18/2021   Procedure: CARDIOVERSION;  Surgeon: Werner Lean, MD;  Location: [facility];  Service: Cardiovascular;  Laterality: N/A;   ESOPHAGOGASTRODUODENOSCOPY (EGD) WITH PROPOFOL N/A 10/01/2020   Procedure: ESOPHAGOGASTRODUODENOSCOPY (EGD) WITH PROPOFOL;  Surgeon: [name], MD;  Location: [facility];  Service: Endoscopy;  Laterality: N/A;   RIGHT/LEFT HEART CATH AND CORONARY ANGIOGRAPHY N/A 08/18/2020   Procedure: RIGHT/LEFT HEART CATH AND CORONARY ANGIOGRAPHY;  Surgeon: [name], MD;  Location: [facility];  Service: Cardiovascular;  Laterality: N/A;   TEE WITHOUT CARDIOVERSION N/A 08/21/2020   Procedure: TRANSESOPHAGEAL ECHOCARDIOGRAM (TEE);  Surgeon: [name], MD;  Location: [facility] ENDOSCOPY;  Service: Cardiovascular;  Laterality: N/A;   TEE WITHOUT CARDIOVERSION N/A 09/01/2020   Procedure: TRANSESOPHAGEAL ECHOCARDIOGRAM (TEE);  Surgeon: [name], MD;  Location: [facility];  Service: Open Heart Surgery;  Laterality: N/A;   TRANSCATHETER AORTIC VALVE REPLACEMENT, TRANSFEMORAL N/A 09/01/2020   Procedure: TRANSCATHETER AORTIC VALVE REPLACEMENT, TRANSFEMORAL;  Surgeon: [name], MD;  Location: [facility];  Service: Open Heart Surgery;  Laterality: N/A;     reports that he has quit smoking. He has never used smokeless tobacco. He reports that he does not currently use drugs. He reports that he does not drink alcohol.  Allergies  Allergen Reactions   Cocoa Hives   Sulfamethoxazole-Trimethoprim Anaphylaxis   Chocolate Hives   Citrus Hives    Reaction to oranges and  orange juice   Fish Allergy Hives and Swelling    Throat swelling   Shellfish Allergy Hives and Swelling    Throat swelling   Sulfa Antibiotics Hives and Swelling    Throat swelling   Tetracycline Swelling    No family history on file.  Prior to Admission medications   Medication Sig Start Date End Date Taking? Authorizing Provider  albuterol (VENTOLIN HFA) 108 (90 Base) MCG/ACT inhaler Inhale 2 puffs into the lungs every 4 (four) hours as needed for wheezing or shortness of breath. 02/26/21   Laurin Coder, MD  amiodarone (PACERONE) 200 MG tablet Take 1 tablet by mouth once daily 09/22/21   Rudean Haskell A, MD  amoxicillin (AMOXIL) 500 MG capsule Take 2000 mg at least 1 hour prior to any dental procedure. 10/30/20   Chandrasekhar, Terisa Starr, MD  apixaban (ELIQUIS) 5 MG TABS tablet Take 1 tablet (5 mg total) by mouth 2 (two) times daily. 05/21/21   Lendon Colonel, NP  Blood Glucose Monitoring Suppl (FIFTY50 GLUCOSE METER 2.0) w/Device KIT See admin instructions. 05/16/19   [provider]  Cholecalciferol 25 MCG (1000 UT) tablet Take 1,000 Units by mouth  at bedtime.     [provider]  diltiazem (CARDIZEM CD) 240 MG 24 hr capsule Take 1 capsule (240 mg total) by mouth daily. 05/21/21   Lendon Colonel, NP  furosemide (LASIX) 40 MG tablet Take 1 tablet (40 mg total) by mouth daily. 03/12/21   Chandrasekhar, Lyda Kalata A, MD  gabapentin (NEURONTIN) 300 MG capsule Take 600 mg by mouth 2 (two) times daily.  08/12/20   [provider]  hydrALAZINE (APRESOLINE) 25 MG tablet Take 1 tablet (25 mg total) by mouth every 8 (eight) hours. 05/21/21   Lendon Colonel, NP  HYDROcodone-acetaminophen (NORCO) 10-325 MG tablet Take 1 tablet by mouth every 6 (six) hours as needed for moderate pain.  08/04/20   [provider]  Iron-Vitamins (GERITOL PO) Take 1 tablet by mouth daily.    [provider]  latanoprost (XALATAN) 0.005 % ophthalmic solution  Place 1 drop into both eyes at bedtime.  07/27/20   [provider]  lisinopril (ZESTRIL) 40 MG tablet Take 1 tablet by mouth daily. 07/13/21   [provider]  metFORMIN (GLUCOPHAGE) 1000 MG tablet Take 1,000 mg by mouth 2 (two) times daily. 07/10/20   [provider]  metoprolol tartrate (LOPRESSOR) 50 MG tablet Take 1 tablet (50 mg total) by mouth 2 (two) times daily. 10/14/20   Eileen Stanford, PA-C  Multiple Vitamin (MULTIVITAMIN WITH MINERALS) TABS tablet Take 1 tablet by mouth at bedtime.     [provider]  neomycin-bacitracin-polymyxin (NEOSPORIN) [phone] ointment Apply 1 application topically 2 (two) times daily as needed (itching).    [provider]  oxyCODONE ER (XTAMPZA ER) 9 MG C12A Take 9 mg by mouth every 12 (twelve) hours.    [provider]  pantoprazole (PROTONIX) 40 MG tablet Take 40 mg by mouth 2 (two) times daily. 05/26/20   [provider]  PRESCRIPTION MEDICATION Inhale into the lungs at bedtime. CPAP    [provider]  rosuvastatin (CRESTOR) 20 MG tablet Take 20 mg by mouth daily.  05/25/20   [provider]    Physical Exam:  Constitutional: Elderly male who appears to be in no acute discomfort Vitals:   09/25/21 0700 09/25/21 0715 09/25/21 0729 09/25/21 0729  BP: 135/61 138/60    Pulse: 64 65    Resp: 20 13    Temp:   98.5 F (36.9 C)   TempSrc:   Axillary   SpO2: 100% 98%    Weight:    127.9 kg  Height:    '5\' 11"'  (1.803 m)   Eyes: PERRL, lids and conjunctivae normal ENMT: Mucous membranes are moist. Posterior pharynx clear of any exudate or lesions.  Neck: normal, supple, no masses, no thyromegaly.  JVD appreciated Respiratory: Coarse breath sounds with decreased work of breathing at this time.  Patient able to talk in shortened sentences currently on BiPAP. Cardiovascular: Regular rate and rhythm, no murmurs / rubs / gallops.  At least +1 pitting bilateral lower extremity  edema. 2+ pedal pulses.   Abdomen: no tenderness, no masses palpated. No hepatosplenomegaly. Bowel sounds positive.  Musculoskeletal: no cyanosis.  Crepitation noted of the left knee. Skin: Healed surgical scar from right knee replacement. Neurologic: CN 2-12 grossly intact. Sensation intact, DTR normal. Strength 5/5 in all 4.  Psychiatric: Normal judgment and insight. Alert and oriented x 3. Normal mood.     Labs on Admission: I have personally reviewed following labs and imaging studies  CBC: Recent Labs  Lab  09/24/21 1712 09/25/21 0318  WBC 7.4  --   HGB 9.8* 12.6*  HCT 32.5* 37.0*  MCV 88.6  --   PLT 222  --    Basic Metabolic Panel: Recent Labs  Lab 09/24/21 1712 09/25/21 0318  NA 136 138  K 4.2 4.2  CL 99  --   CO2 28  --   GLUCOSE 117*  --   BUN 21  --   CREATININE 1.63*  --   CALCIUM 9.4  --    GFR: Estimated Creatinine Clearance: 59.9 mL/min (A) (by C-G formula based on SCr of 1.63 mg/dL (H)). Liver Function Tests: No results for input(s): AST, ALT, ALKPHOS, BILITOT, PROT, ALBUMIN in the last 168 hours. No results for input(s): LIPASE, AMYLASE in the last 168 hours. No results for input(s): AMMONIA in the last 168 hours. Coagulation Profile: No results for input(s): INR, PROTIME in the last 168 hours. Cardiac Enzymes: No results for input(s): CKTOTAL, CKMB, CKMBINDEX, TROPONINI in the last 168 hours. BNP (last 3 results) No results for input(s): PROBNP in the last 8760 hours. HbA1C: No results for input(s): HGBA1C in the last 72 hours. CBG: No results for input(s): GLUCAP in the last 168 hours. Lipid Profile: No results for input(s): CHOL, HDL, LDLCALC, TRIG, CHOLHDL, LDLDIRECT in the last 72 hours. Thyroid Function Tests: No results for input(s): TSH, T4TOTAL, FREET4, T3FREE, THYROIDAB in the last 72 hours. Anemia Panel: No results for input(s): VITAMINB12, FOLATE, FERRITIN, TIBC, IRON, RETICCTPCT in the last 72 hours. Urine analysis:    Component  Value Date/Time   COLORURINE YELLOW 08/27/2020 [place] 08/27/2020 1525   LABSPEC 1.009 08/27/2020 1525   PHURINE 6.0 08/27/2020 1525   GLUCOSEU NEGATIVE 08/27/2020 1525   HGBUR NEGATIVE 08/27/2020 [place] 08/27/2020 [place] 08/27/2020 1525   PROTEINUR NEGATIVE 08/27/2020 1525   NITRITE NEGATIVE 08/27/2020 1525   LEUKOCYTESUR NEGATIVE 08/27/2020 1525   Sepsis Labs: Recent Results (from the past 240 hour(s))  Resp Panel by RT-PCR (Flu A&B, Covid) Nasopharyngeal Swab     Status: None   Collection Time: 09/25/21  2:51 AM   Specimen: Nasopharyngeal Swab; Nasopharyngeal(NP) swabs in vial transport medium  Result Value Ref Range Status   SARS Coronavirus 2 by RT PCR NEGATIVE NEGATIVE Final    Comment: (NOTE) SARS-CoV-2 target nucleic acids are NOT DETECTED.  The SARS-CoV-2 RNA is generally detectable in upper respiratory specimens during the acute phase of infection. The lowest concentration of SARS-CoV-2 viral copies this assay can detect is 138 copies/mL. A negative result does not preclude SARS-Cov-2 infection and should not be used as the sole basis for treatment or other patient management decisions. A negative result may occur with  improper specimen collection/handling, submission of specimen other than nasopharyngeal swab, presence of viral mutation(s) within the areas targeted by this assay, and inadequate number of viral copies(<138 copies/mL). A negative result must be combined with clinical observations, patient history, and epidemiological information. The expected result is Negative.  Fact Sheet for Patients:  EntrepreneurPulse.com.au  Fact Sheet for Healthcare Providers:  IncredibleEmployment.be  This test is no t yet approved or cleared by the [place] FDA and  has been authorized for detection and/or diagnosis of SARS-CoV-2 by FDA under an Emergency Use Authorization  (EUA). This EUA will remain  in effect (meaning this test can be used) for the duration of the COVID-19 declaration under Section 564(b)(1) of the Act, 21 U.S.C.section 360bbb-3(b)(1), unless the  authorization is terminated  or revoked sooner.       Influenza A by PCR NEGATIVE NEGATIVE Final   Influenza B by PCR NEGATIVE NEGATIVE Final    Comment: (NOTE) The Xpert Xpress SARS-CoV-2/FLU/RSV plus assay is intended as an aid in the diagnosis of influenza from Nasopharyngeal swab specimens and should not be used as a sole basis for treatment. Nasal washings and aspirates are unacceptable for Xpert Xpress SARS-CoV-2/FLU/RSV testing.  Fact Sheet for Patients: EntrepreneurPulse.com.au  Fact Sheet for Healthcare Providers: IncredibleEmployment.be  This test is not yet approved or cleared by the [place] FDA and has been authorized for detection and/or diagnosis of SARS-CoV-2 by FDA under an Emergency Use Authorization (EUA). This EUA will remain in effect (meaning this test can be used) for the duration of the COVID-19 declaration under Section 564(b)(1) of the Act, 21 U.S.C. section 360bbb-3(b)(1), unless the authorization is terminated or revoked.  Performed at [facility], [place] [address] [place], [place] [address]      Radiological Exams on Admission: DG Chest 2 View  Result Date: 09/24/2021 CLINICAL DATA:  Short of breath for several days EXAM: CHEST - 2 VIEW COMPARISON:  09/24/2021 at 2:53 p.m. FINDINGS: Frontal and lateral views of the chest demonstrate postsurgical changes from aortic valve prosthesis. Cardiac silhouette is mildly enlarged. Since the exam performed earlier today, there has been interval progression of the bibasilar consolidation, with development of diffuse interstitial prominence. There are stable small bilateral pleural effusions. No pneumothorax. IMPRESSION: 1. Findings consistent with progressive congestive  heart failure, with worsening volume status since earlier today. Electronically Signed   By: Randa Ngo M.D.   On: 09/24/2021 18:14    EKG: Independently reviewed.  Normal sinus rhythm at 66 bpm  Assessment/Plan  Acute respiratory failure with hypoxia secondary to CHF exacerbation: Patient presents with progressively worsening shortness of breath.  On have O2 saturations as low as 82% on room air which he was placed on 3 L of nasal cannula oxygen with improvement of saturation.  Chest x-ray noting progressive worsening findings for congestive heart failure exacerbation.  BNP was elevated at 566.6.  Last EF was noted to be 60- 65% with resolution of pulmonary artery hypertension per echocardiogram performed 08/26/2021.  Heart failure exacerbation suspected secondary to patient being advised to stop furosemide 40 mg daily possibly in October after PCP noticed that kidney function had increased, but is not clearly documented from what is able to be seen.  He had temporarily been placed on BiPAP due to his work of breathing which has been improving since which improved patient had been given 40 mg of Lasix IV. -Admit to a telemetry bed -Heart failure orders set  initiated  -Continuous pulse oximetry with nasal cannula oxygen as needed to keep O2 saturations >92% -Wean off BiPAP as able -Strict I&Os and daily weights -Elevate lower extremities -Lasix 40 mg IV Bid  -Reassess in a.m. and adjust diuresis as needed. -Echocardiogram recently performed -Continue beta-blocker, hydralazine, -Message sent to make Dr. Greer Pickerel aware of the patient's admission.  Paroxysmal atrial fibrillation/flutter on chronic anticoagulation: Patient had gone into atrial flutter/fibrillation following his TAVR.  Status post DCCV, but had been started on anticoagulation as well.  He reports taking Eliquis as prescribed -Continue Eliquis, amiodarone, Cardizem, and metoprolol  Essential hypertension: Home blood pressure  regimen includes metoprolol 50 mg twice daily, lisinopril 40 mg daily, hydralazine 25 mg 3 times daily, Cardizem 250 mg daily, and amiodarone 200 mg daily.  Normocytic anemia: Hemoglobin 9.8 g/dL on admission.  Baseline hemoglobin has been 9-11 per review of labs from last discharge in 2021.  Denies any blood in stools. -Continue to monitor H&H  Chest pain: Patient reported 1 episode of quickly resolving substernal chest pain.  Work-up so far noted high-sensitivity troponins negative x2 and EKG without significant ischemic changes.  Possibly related with CHF exacerbation. -Follow-up telemetry  Chronic kidney disease stage IIIa: Creatinine 1.63 on admission which appears to be relatively unchanged from earlier this year. -Continue to monitor with diuresis  CAD: Heart cath from 08/2020 noted widely patent coronary arteries at that time. -Continue Plavix  Diabetes mellitus type 2: On admission glucose was noted to be 117.  Last available hemoglobin A1c was 5.6 on 01/18/2021.  Home regimen includes metformin 1000 mg twice daily. -Hypoglycemic protocols -Held metformin  -Check CBGs daily  -Consider starting sliding scale insulin if blood sugars appear to be trending greater than 180  Chronic pain syndrome: Patient is followed by the [facility] clinic in [facility].  PMD P reviewed. -Continue hydrocodone as needed, gabapentin, and pharmacy substitution for oxycodone ER  OSA on CPAP: Patient reports being on CPAP with 2 L nasal cannula oxygen at night. -Continue CPAP at night  History of GI bleed: Patient reports that this occurred several years ago and has not had any recurrence of bleeding.  Obesity: BMI 39.33 kg/m  DVT prophylaxis: Eliquis Code Status: Full Family Communication: Patient's brother was updated over the phone Disposition Plan: Likely discharge home once medically Consults called: None Admission status: Inpatient require more than 2 midnight stay for need of IV  diuresis  Norval Morton MD [facility]   If 7PM-7AM, please contact night-coverage   09/25/2021, 7:46 AM

## 2021-09-26 DIAGNOSIS — I350 Nonrheumatic aortic (valve) stenosis: Secondary | ICD-10-CM

## 2021-09-26 DIAGNOSIS — J9621 Acute and chronic respiratory failure with hypoxia: Secondary | ICD-10-CM

## 2021-09-26 LAB — BASIC METABOLIC PANEL
Anion gap: 8 (ref 5–15)
BUN: 26 mg/dL — ABNORMAL HIGH (ref 8–23)
CO2: 32 mmol/L (ref 22–32)
Calcium: 8.7 mg/dL — ABNORMAL LOW (ref 8.9–10.3)
Chloride: 96 mmol/L — ABNORMAL LOW (ref 98–111)
Creatinine, Ser: 1.95 mg/dL — ABNORMAL HIGH (ref 0.61–1.24)
GFR, Estimated: 37 mL/min — ABNORMAL LOW (ref >60)
Glucose, Bld: 107 mg/dL — ABNORMAL HIGH (ref 70–99)
Potassium: 3.7 mmol/L (ref 3.5–5.1)
Sodium: 136 mmol/L (ref 135–145)

## 2021-09-26 LAB — MAGNESIUM: Magnesium: 2.1 mg/dL (ref 1.7–2.4)

## 2021-09-26 LAB — GLUCOSE, CAPILLARY: Glucose-Capillary: 115 mg/dL — ABNORMAL HIGH (ref 70–99)

## 2021-09-26 LAB — HEMOGLOBIN A1C
Hgb A1c MFr Bld: 5.2 % (ref 4.8–5.6)
Mean Plasma Glucose: 102.54 mg/dL

## 2021-09-26 LAB — PHOSPHORUS: Phosphorus: 4.5 mg/dL (ref 2.5–4.6)

## 2021-09-26 MED ORDER — POLYETHYLENE GLYCOL 3350 17 G PO PACK
17.0000 g | PACK | Freq: Every day | ORAL | Status: DC
  Administered 2021-09-26 – 2021-10-04 (×8): 17 g via ORAL
  Filled 2021-09-26 (×9): qty 1

## 2021-09-26 MED ORDER — SENNOSIDES-DOCUSATE SODIUM 8.6-50 MG PO TABS
1.0000 | ORAL_TABLET | Freq: Every day | ORAL | Status: DC
  Administered 2021-09-26 – 2021-10-04 (×8): 1 via ORAL
  Filled 2021-09-26 (×9): qty 1

## 2021-09-26 NOTE — Progress Notes (Signed)
Patient ambulated in hall. Pt was sitting in the bed on RA sating at 90%. During ambulation, pt was sating 85-89% but the O2 pleth was not great. Pt paused once and was sating 85% on RA, complaining of neck pain but no SOB. Once back in the room, pt sat on the bed sating 84%. RN placed pt back on 2L O2 and sating 94%.

## 2021-09-26 NOTE — Progress Notes (Signed)
PROGRESS NOTE  Laeton Rei  VEL:[id number] DOB: [birth year] DOA: 09/24/2021 PCP: Lenox Ponds, MD   Brief Narrative: Ruthford Kozicki is a 65 y.o. male with a history of HFpEF, severe AS s/p TAVR 2021, PAF s/p DCCV, HTN, stage IIIa CKD, T2DM, OSA, and GI bleeding who presented to the ED 12/9 with worsening exertional dyspnea and some orthopnea. He had been taken off lasix 40mg  daily due to rising creatinine as an outpatient prior to this. In the ED he was hypoxic with elevated BNP, SCr 1.63, negative troponin x2. CXR showed cardiomegaly, bibasilar opacities, interstitial prominence, and bilateral pleural effusions. IV lasix was given and admission requested for acute hypoxic respiratory failure due to acute on chronic HFpEF.   Assessment & Plan: Principal Problem:   CHF exacerbation (HCC) Active Problems:   OSA on CPAP   Severe aortic stenosis   Normocytic anemia   S/P TAVR (transcatheter aortic valve replacement)   Chronic anticoagulation   Diabetes mellitus type 2 in obese (HCC)   Respiratory failure with hypoxia (HCC)   Chest pain   Chronic pain syndrome   CHF (congestive heart failure) (HCC)  Acute on chronic hypoxic respiratory failure due to acute on chronic HFpEF:  - Continue lasix 80mg  IV BID. Cr, BUN, bicarb all risen since admission though has diuresed briskly, 5L yesterday, and remains hypoxic/volume overloaded. Will monitor BMP.  - Suspect decompensation due to not being on lasix, not change in cardiac function, so echocardiogram not repeated this admission.  - Continue supplemental oxygen to maintain SpO2 >90% and normal respiratory effort. Pt on 2L nocturnal oxygen at baseline, but requiring it during the day now.  HTN:  - Continue home metoprolol, diltiazem, hydralazine. Hold lisinopril since diuresing and no rEF.   PAF: following TAVR, s/p DCCV, currently NSR.  - Continue home metoprolol, diltiazem, amiodarone, and eliquis.   Stage IIIa CKD:  - Monitor daily,  hold metformin. Avoid nephrotoxins  Anemia of CKD: Presumed diagnosis. Chronic normocytic anemia without gross bleeding.  - Monitor H/H  CAD: Widely patent coronaries on cardiac catheterization Nov 2021. Troponins negative.  - Continue plavix, BB, statin  OSA:  - Continue CPAP w/2L O2 (or higher prn) qHS  T2DM: HbA1c 5.2%.  - Hold metformin. No SSI indicated at this time.   Chronic pain syndrome:  - Continue chronic medications.   Obesity: Estimated body mass index is 37.87 kg/m as calculated from the following:   Height as of this encounter: 5\' 11"  (1.803 m).   Weight as of this encounter: 123.2 kg.  DVT prophylaxis: Eliquis Code Status: Full Family Communication: None at the bedside Disposition Plan:  Status is: Inpatient  Remains inpatient appropriate because: IV diuresis  Consultants:  Cardiology  Procedures:  None  Antimicrobials: None   Subjective: Feeling breathing is better, but not near baseline yet. No chest pains.   Objective: Vitals:   09/25/21 2355 09/26/21 0241 09/26/21 0554 09/26/21 0923  BP: (!) 112/49  114/64 (!) 118/48  Pulse: (!) 54 (!) 53 60 (!) 57  Resp: 13 11 14    Temp:   98.1 F (36.7 C)   TempSrc:   Oral   SpO2: 97% 96% 96%   Weight:   123.2 kg   Height:        Intake/Output Summary (Last 24 hours) at 09/26/2021 1407 Last data filed at 09/26/2021 1047 Gross per 24 hour  Intake 240 ml  Output 4650 ml  Net -4410 ml   Filed Weights   09/25/21 0729  09/25/21 1438 09/26/21 0554  Weight: 127.9 kg 123.8 kg 123.2 kg    Gen: 65 y.o. male in no distress  Pulm: Non-labored breathing supplemental oxygen, crackles at bases.  CV: Regular rate and rhythm. No murmur, rub, or gallop. UTD JVD, trace pedal edema. GI: Abdomen soft, non-tender, non-distended, with normoactive bowel sounds. No organomegaly or masses felt. Ext: Warm, no deformities Skin: No rashes, lesions or ulcers. Scar s/p right TKA healed Neuro: Alert and oriented. No  focal neurological deficits. Psych: Judgement and insight appear normal. Mood & affect appropriate.   Data Reviewed: I have personally reviewed following labs and imaging studies  CBC: Recent Labs  Lab 09/24/21 1712 09/25/21 0318  WBC 7.4  --   HGB 9.8* 12.6*  HCT 32.5* 37.0*  MCV 88.6  --   PLT 222  --    Basic Metabolic Panel: Recent Labs  Lab 09/24/21 1712 09/25/21 0318 09/26/21 0301  NA 136 138 136  K 4.2 4.2 3.7  CL 99  --  96*  CO2 28  --  32  GLUCOSE 117*  --  107*  BUN 21  --  26*  CREATININE 1.63*  --  1.95*  CALCIUM 9.4  --  8.7*  MG  --   --  2.1  PHOS  --   --  4.5   GFR: Estimated Creatinine Clearance: 49.1 mL/min (A) (by C-G formula based on SCr of 1.95 mg/dL (H)). Liver Function Tests: No results for input(s): AST, ALT, ALKPHOS, BILITOT, PROT, ALBUMIN in the last 168 hours. No results for input(s): LIPASE, AMYLASE in the last 168 hours. No results for input(s): AMMONIA in the last 168 hours. Coagulation Profile: No results for input(s): INR, PROTIME in the last 168 hours. Cardiac Enzymes: No results for input(s): CKTOTAL, CKMB, CKMBINDEX, TROPONINI in the last 168 hours. BNP (last 3 results) No results for input(s): PROBNP in the last 8760 hours. HbA1C: Recent Labs    09/26/21 0301  HGBA1C 5.2   CBG: Recent Labs  Lab 09/25/21 1307 09/25/21 1613 09/26/21 0626  GLUCAP 159* 130* 115*   Lipid Profile: No results for input(s): CHOL, HDL, LDLCALC, TRIG, CHOLHDL, LDLDIRECT in the last 72 hours. Thyroid Function Tests: No results for input(s): TSH, T4TOTAL, FREET4, T3FREE, THYROIDAB in the last 72 hours. Anemia Panel: No results for input(s): VITAMINB12, FOLATE, FERRITIN, TIBC, IRON, RETICCTPCT in the last 72 hours. Urine analysis:    Component Value Date/Time   COLORURINE STRAW (A) 09/25/2021 0839   APPEARANCEUR CLEAR 09/25/2021 0839   LABSPEC 1.006 09/25/2021 0839   PHURINE 5.0 09/25/2021 0839   GLUCOSEU NEGATIVE 09/25/2021 0839    HGBUR SMALL (A) 09/25/2021 0839   BILIRUBINUR NEGATIVE 09/25/2021 0839   KETONESUR 5 (A) 09/25/2021 0839   PROTEINUR 30 (A) 09/25/2021 0839   NITRITE NEGATIVE 09/25/2021 0839   LEUKOCYTESUR NEGATIVE 09/25/2021 0839   Recent Results (from the past 240 hour(s))  Resp Panel by RT-PCR (Flu A&B, Covid) Nasopharyngeal Swab     Status: None   Collection Time: 09/25/21  2:51 AM   Specimen: Nasopharyngeal Swab; Nasopharyngeal(NP) swabs in vial transport medium  Result Value Ref Range Status   SARS Coronavirus 2 by RT PCR NEGATIVE NEGATIVE Final    Comment: (NOTE) SARS-CoV-2 target nucleic acids are NOT DETECTED.  The SARS-CoV-2 RNA is generally detectable in upper respiratory specimens during the acute phase of infection. The lowest concentration of SARS-CoV-2 viral copies this assay can detect is 138 copies/mL. A negative result does not preclude  SARS-Cov-2 infection and should not be used as the sole basis for treatment or other patient management decisions. A negative result may occur with  improper specimen collection/handling, submission of specimen other than nasopharyngeal swab, presence of viral mutation(s) within the areas targeted by this assay, and inadequate number of viral copies(<138 copies/mL). A negative result must be combined with clinical observations, patient history, and epidemiological information. The expected result is Negative.  Fact Sheet for Patients:  EntrepreneurPulse.com.au  Fact Sheet for Healthcare Providers:  IncredibleEmployment.be  This test is no t yet approved or cleared by the [place] FDA and  has been authorized for detection and/or diagnosis of SARS-CoV-2 by FDA under an Emergency Use Authorization (EUA). This EUA will remain  in effect (meaning this test can be used) for the duration of the COVID-19 declaration under Section 564(b)(1) of the Act, 21 U.S.C.section 360bbb-3(b)(1), unless the authorization  is terminated  or revoked sooner.       Influenza A by PCR NEGATIVE NEGATIVE Final   Influenza B by PCR NEGATIVE NEGATIVE Final    Comment: (NOTE) The Xpert Xpress SARS-CoV-2/FLU/RSV plus assay is intended as an aid in the diagnosis of influenza from Nasopharyngeal swab specimens and should not be used as a sole basis for treatment. Nasal washings and aspirates are unacceptable for Xpert Xpress SARS-CoV-2/FLU/RSV testing.  Fact Sheet for Patients: EntrepreneurPulse.com.au  Fact Sheet for Healthcare Providers: IncredibleEmployment.be  This test is not yet approved or cleared by the [place] FDA and has been authorized for detection and/or diagnosis of SARS-CoV-2 by FDA under an Emergency Use Authorization (EUA). This EUA will remain in effect (meaning this test can be used) for the duration of the COVID-19 declaration under Section 564(b)(1) of the Act, 21 U.S.C. section 360bbb-3(b)(1), unless the authorization is terminated or revoked.  Performed at [facility], [place] [address], [place], [place] [address]       Radiology Studies: DG Chest 2 View  Result Date: 09/24/2021 CLINICAL DATA:  Short of breath for several days EXAM: CHEST - 2 VIEW COMPARISON:  09/24/2021 at 2:53 p.m. FINDINGS: Frontal and lateral views of the chest demonstrate postsurgical changes from aortic valve prosthesis. Cardiac silhouette is mildly enlarged. Since the exam performed earlier today, there has been interval progression of the bibasilar consolidation, with development of diffuse interstitial prominence. There are stable small bilateral pleural effusions. No pneumothorax. IMPRESSION: 1. Findings consistent with progressive congestive heart failure, with worsening volume status since earlier today. Electronically Signed   By: Randa Ngo M.D.   On: 09/24/2021 18:14    Scheduled Meds:  amiodarone  200 mg Oral Daily   apixaban  5 mg Oral BID    clopidogrel  75 mg Oral q morning   diltiazem  240 mg Oral Daily   furosemide  40 mg Intravenous BID   gabapentin  600 mg Oral BID   hydrALAZINE  25 mg Oral Q8H   latanoprost  1 drop Both Eyes QHS   metoprolol tartrate  50 mg Oral BID   oxyCODONE  10 mg Oral Q12H   pantoprazole  40 mg Oral BID   rosuvastatin  20 mg Oral Daily   sodium chloride flush  3 mL Intravenous Q12H   Continuous Infusions:   LOS: 1 day   Time spent: 25 minutes.  [name], MD [facility] [email or web address] 09/26/2021, 2:07 PM

## 2021-09-26 NOTE — Consult Note (Signed)
Cardiology Consultation:   Patient ID: Lawrence Rose MRN: [id number]; DOB: 1956/10/09  Admit date: 09/24/2021 Date of Consult: 09/26/2021  PCP:  Doreatha Lew, MD   [facility] Providers Cardiologist:  Werner Lean, MD    Patient Profile:   Lawrence Rose is a 65 y.o. male with a hx of HTN, HFpEF, severe AS s/p TAVR 08/2020, pAF/AFL, DM, CKDIII, R TKA, OSA on CPAP who is being seen 09/26/2021 for the evaluation of decompensated HF at the request of Dr Tamala Julian.  History of Present Illness:   Lawrence Rose presented to the ER yesterday with 2-3 days of dyspnea. He was in respiratory distress upon arrival requiring BiPAP. He had stopped his lasix as an outpatient. In the ER he was hypoxic to the low 80s. He was given IV lasix 40mg  x 1 with improvement in his symptoms. This morning he is feeling much better. He tells me that he hasn't taken lasix in "months".    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 Angelena Form and Dr. Cyndia Bent.    Severe aortic stenosis    Sleep apnea    wears CPAP every night    Past Surgical History:  Procedure Laterality Date   BACK SURGERY     CARDIOVERSION  01/18/2021   CARDIOVERSION N/A 01/18/2021   Procedure: CARDIOVERSION;  Surgeon: Werner Lean, MD;  Location: [facility];  Service: Cardiovascular;  Laterality: N/A;   ESOPHAGOGASTRODUODENOSCOPY (EGD) WITH PROPOFOL N/A 10/01/2020   Procedure: ESOPHAGOGASTRODUODENOSCOPY (EGD) WITH PROPOFOL;  Surgeon: [name], MD;   Location: [facility];  Service: Endoscopy;  Laterality: N/A;   RIGHT/LEFT HEART CATH AND CORONARY ANGIOGRAPHY N/A 08/18/2020   Procedure: RIGHT/LEFT HEART CATH AND CORONARY ANGIOGRAPHY;  Surgeon: [name], MD;  Location: [place] [facility];  Service: Cardiovascular;  Laterality: N/A;   TEE WITHOUT CARDIOVERSION N/A 08/21/2020   Procedure: TRANSESOPHAGEAL ECHOCARDIOGRAM (TEE);  Surgeon: [name], MD;  Location: [facility] ENDOSCOPY;  Service: Cardiovascular;  Laterality: N/A;   TEE WITHOUT CARDIOVERSION N/A 09/01/2020   Procedure: TRANSESOPHAGEAL ECHOCARDIOGRAM (TEE);  Surgeon: [name], MD;  Location: [place];  Service: Open Heart Surgery;  Laterality: N/A;   TRANSCATHETER AORTIC VALVE REPLACEMENT, TRANSFEMORAL N/A 09/01/2020   Procedure: TRANSCATHETER AORTIC VALVE REPLACEMENT, TRANSFEMORAL;  Surgeon: [name], MD;  Location: [place];  Service: Open Heart Surgery;  Laterality: N/A;       Inpatient Medications: Scheduled Meds:  amiodarone  200 mg Oral Daily   apixaban  5 mg Oral BID   clopidogrel  75 mg Oral q morning   diltiazem  240 mg Oral Daily   furosemide  40 mg Intravenous BID   gabapentin  600 mg Oral BID   hydrALAZINE  25 mg Oral Q8H   latanoprost  1 drop Both Eyes QHS   metoprolol tartrate  50 mg Oral BID  oxyCODONE  10 mg Oral Q12H   pantoprazole  40 mg Oral BID   rosuvastatin  20 mg Oral Daily   sodium chloride flush  3 mL Intravenous Q12H   Continuous Infusions:  PRN Meds: acetaminophen **OR** acetaminophen, albuterol, alum & mag hydroxide-simeth, HYDROcodone-acetaminophen, neomycin-bacitracin-polymyxin, ondansetron **OR** ondansetron (ZOFRAN) IV  Allergies:    Allergies  Allergen Reactions   Cocoa Hives   Sulfamethoxazole-Trimethoprim Anaphylaxis   Chocolate Hives   Citrus Hives    Reaction to oranges and orange juice   Fish Allergy Hives and Swelling    Throat swelling   Shellfish Allergy Hives and  Swelling    Throat swelling   Sulfa Antibiotics Hives and Swelling    Throat swelling   Tetracycline Swelling    Social History:   Social History   Socioeconomic History   Marital status: Single    Spouse name: Not on file   Number of children: Not on file   Years of education: Not on file   Highest education level: Not on file  Occupational History   Not on file  Tobacco Use   Smoking status: Former   Smokeless tobacco: Never   Tobacco comments:    per patient quit over 30 years ago  Vaping Use   Vaping Use: Never used  Substance and Sexual Activity   Alcohol use: No   Drug use: Not Currently   Sexual activity: Not on file  Other Topics Concern   Not on file  Social History Narrative   Not on file   Social Determinants of Health   Financial Resource Strain: Not on file  Food Insecurity: Not on file  Transportation Needs: Not on file  Physical Activity: Not on file  Stress: Not on file  Social Connections: Not on file  Intimate Partner Violence: Not on file    Family History:   No family history on file.   ROS:  Please see the history of present illness.   All other ROS reviewed and negative.     Physical Exam/Data:   Vitals:   09/25/21 2230 09/25/21 2355 09/26/21 0241 09/26/21 0554  BP:  (!) 112/49  114/64  Pulse: 61 (!) 54 (!) 53 60  Resp: 15 13 11 14   Temp:    98.1 F (36.7 C)  TempSrc:    Oral  SpO2: 94% 97% 96% 96%  Weight:    123.2 kg  Height:        Intake/Output Summary (Last 24 hours) at 09/26/2021 0836 Last data filed at 09/26/2021 0616 Gross per 24 hour  Intake 240 ml  Output 3400 ml  Net -3160 ml   Last 3 Weights 09/26/2021 09/25/2021 09/25/2021  Weight (lbs) 271 lb 8 oz 273 lb 281 lb 15.5 oz  Weight (kg) 123.152 kg 123.832 kg 127.9 kg     Body mass index is 37.87 kg/m.   General:  Well nourished, well developed, in no acute distress laying at 30 degrees HEENT: normal Neck: no JVD Vascular: No carotid bruits; Distal  pulses 2+ bilaterally Cardiac:  normal S1, S2; RRR; no murmur. 2+ bilateral pitting edema to knees Lungs:  clear to auscultation bilaterally, no wheezing, rhonchi or rales. On O2 via [place]. Abd: soft, nontender, no hepatomegaly  Ext: edema as above Musculoskeletal:  No deformities, BUE and BLE strength normal and equal Skin: warm and dry  Neuro:  CNs 2-12 intact, no focal abnormalities noted Psych:  Normal affect   EKG:  The EKG was personally reviewed and  demonstrates:  sinus rhythm Telemetry:  Telemetry was personally reviewed and demonstrates:  sinus rhythm  Relevant CV Studies:  08/26/2021 Echo LV function normal AV prosthesis functioning well RV normal Trivial MR No MS  09/24/2021 CXR Bilateral pulmonary edema   Laboratory Data:  High Sensitivity Troponin:   Recent Labs  Lab 09/25/21 0207 09/25/21 0415  TROPONINIHS 13 17     Chemistry Recent Labs  Lab 09/24/21 1712 09/25/21 0318 09/26/21 0301  NA 136 138 136  K 4.2 4.2 3.7  CL 99  --  96*  CO2 28  --  32  GLUCOSE 117*  --  107*  BUN 21  --  26*  CREATININE 1.63*  --  1.95*  CALCIUM 9.4  --  8.7*  MG  --   --  2.1  GFRNONAA 46*  --  37*  ANIONGAP 9  --  8    No results for input(s): PROT, ALBUMIN, AST, ALT, ALKPHOS, BILITOT in the last 168 hours. Lipids No results for input(s): CHOL, TRIG, HDL, LABVLDL, LDLCALC, CHOLHDL in the last 168 hours.  Hematology Recent Labs  Lab 09/24/21 1712 09/25/21 0318  WBC 7.4  --   RBC 3.67*  --   HGB 9.8* 12.6*  HCT 32.5* 37.0*  MCV 88.6  --   MCH 26.7  --   MCHC 30.2  --   RDW 15.5  --   PLT 222  --    Thyroid No results for input(s): TSH, FREET4 in the last 168 hours.  BNP Recent Labs  Lab 09/25/21 0207  BNP 566.6*    DDimer No results for input(s): DDIMER in the last 168 hours.   Radiology/Studies:  DG Chest 2 View  Result Date: 09/24/2021 CLINICAL DATA:  Short of breath for several days EXAM: CHEST - 2 VIEW COMPARISON:  09/24/2021 at 2:53 p.m.  FINDINGS: Frontal and lateral views of the chest demonstrate postsurgical changes from aortic valve prosthesis. Cardiac silhouette is mildly enlarged. Since the exam performed earlier today, there has been interval progression of the bibasilar consolidation, with development of diffuse interstitial prominence. There are stable small bilateral pleural effusions. No pneumothorax. IMPRESSION: 1. Findings consistent with progressive congestive heart failure, with worsening volume status since earlier today. Electronically Signed   By: Sharlet Salina M.D.   On: 09/24/2021 18:14     Assessment and Plan:   Acute on Chronic Diastolic Heart Failure Likely 2/2 stopping lasix as outpatient. He will require outpatient diuretics at discharge. He is significantly volume overloaded. If inadequate response to bolus diuretic dosing, recommend transitioning to IV gtt. - recommend continuing lasix 40mg  IV BID for now.  - daily weights - K>4, Mg>2  2. pAF/AFL In sinus rhythm currently. Continue apixaban Continue metoprolol   For questions or updates, please contact [facility] Please consult [email or web address] for contact info under    Signed, , MD  09/26/2021 8:36 AM

## 2021-09-27 ENCOUNTER — Inpatient Hospital Stay (HOSPITAL_COMMUNITY): Payer: Medicare HMO

## 2021-09-27 ENCOUNTER — Encounter (HOSPITAL_COMMUNITY): Payer: Self-pay | Admitting: Internal Medicine

## 2021-09-27 DIAGNOSIS — I5033 Acute on chronic diastolic (congestive) heart failure: Secondary | ICD-10-CM | POA: Diagnosis not present

## 2021-09-27 LAB — BASIC METABOLIC PANEL
Anion gap: 10 (ref 5–15)
BUN: 39 mg/dL — ABNORMAL HIGH (ref 8–23)
CO2: 32 mmol/L (ref 22–32)
Calcium: 8.5 mg/dL — ABNORMAL LOW (ref 8.9–10.3)
Chloride: 94 mmol/L — ABNORMAL LOW (ref 98–111)
Creatinine, Ser: 2.58 mg/dL — ABNORMAL HIGH (ref 0.61–1.24)
GFR, Estimated: 26 mL/min — ABNORMAL LOW (ref >60)
Glucose, Bld: 121 mg/dL — ABNORMAL HIGH (ref 70–99)
Potassium: 4.4 mmol/L (ref 3.5–5.1)
Sodium: 136 mmol/L (ref 135–145)

## 2021-09-27 LAB — ECHOCARDIOGRAM LIMITED
AR max vel: 1.39 cm2
AV Area VTI: 1.36 cm2
AV Area mean vel: 1.26 cm2
AV Mean grad: 15 mmHg
AV Peak grad: 25.2 mmHg
Ao pk vel: 2.51 m/s
Calc EF: 56.1 %
Height: 71 in
S' Lateral: 2.3 cm
Single Plane A2C EF: 54.1 %
Single Plane A4C EF: 58.3 %
Weight: 4342.18 oz

## 2021-09-27 LAB — GLUCOSE, CAPILLARY: Glucose-Capillary: 114 mg/dL — ABNORMAL HIGH (ref 70–99)

## 2021-09-27 LAB — MAGNESIUM: Magnesium: 2.2 mg/dL (ref 1.7–2.4)

## 2021-09-27 MED ORDER — DILTIAZEM HCL ER COATED BEADS 180 MG PO CP24: 180.0000 mg | ORAL_CAPSULE | Freq: Every day | ORAL | Status: DC

## 2021-09-27 MED ORDER — FUROSEMIDE 10 MG/ML IJ SOLN
40.0000 mg | Freq: Every day | INTRAMUSCULAR | Status: DC
  Administered 2021-09-27: 40 mg via INTRAVENOUS
  Filled 2021-09-27: qty 4

## 2021-09-27 NOTE — Progress Notes (Signed)
   Contacted by RN re: Hypotension  Mr. Glidden's blood pressure this afternoon was 89/46, heart rate 56.  He had his other scheduled medications this morning including amiodarone, diltiazem CD 240 mg daily, hydralazine 25 mg this a.m. and metoprolol tartrate 50 mg.  Of note, he got these same morning medications yesterday and his blood pressure in the middle of the day was 89 systolic, the middle dose of hydralazine 25 mg was held yesterday as well.  Will route this to Dr. Bjorn Pippin to let him decide if any med changes need to be made.  Theodore Demark, PA-C 09/27/2021 2:24 PM

## 2021-09-27 NOTE — Progress Notes (Signed)
*  PRELIMINARY RESULTS* Echocardiogram Limited 2D Echocardiogram has been performed.  Lawrence Rose 09/27/2021, 3:36 PM

## 2021-09-27 NOTE — Progress Notes (Signed)
Patient's BP 89/46, HR 56, denies dizziness.  PA Barrett notified, patient due for 25mg  Hydralazine po.

## 2021-09-27 NOTE — Consult Note (Addendum)
Progress Note  Patient Name: Lawrence Rose Date of Encounter: 09/27/2021  [facility] Cardiologist: Werner Lean, MD   Subjective   Reports dyspnea significantly improved.  Inpatient Medications    Scheduled Meds:  amiodarone  200 mg Oral Daily   apixaban  5 mg Oral BID   clopidogrel  75 mg Oral q morning   diltiazem  240 mg Oral Daily   furosemide  40 mg Intravenous Daily   gabapentin  600 mg Oral BID   hydrALAZINE  25 mg Oral Q8H   latanoprost  1 drop Both Eyes QHS   metoprolol tartrate  50 mg Oral BID   oxyCODONE  10 mg Oral Q12H   pantoprazole  40 mg Oral BID   polyethylene glycol  17 g Oral Daily   rosuvastatin  20 mg Oral Daily   senna-docusate  1 tablet Oral Daily   sodium chloride flush  3 mL Intravenous Q12H   Continuous Infusions:  PRN Meds: acetaminophen **OR** acetaminophen, albuterol, alum & mag hydroxide-simeth, HYDROcodone-acetaminophen, neomycin-bacitracin-polymyxin, ondansetron **OR** ondansetron (ZOFRAN) IV   Vital Signs    Vitals:   09/26/21 2330 09/26/21 2340 09/27/21 0431 09/27/21 0814  BP:   (!) 112/52 (!) 108/53  Pulse: (!) 55 (!) 52 (!) 58 62  Resp: 16 13 12    Temp:   97.7 F (36.5 C)   TempSrc:   Axillary   SpO2: 96% 95% 98% 92%  Weight:   123.1 kg   Height:        Intake/Output Summary (Last 24 hours) at 09/27/2021 1022 Last data filed at 09/27/2021 [id number] Gross per 24 hour  Intake 360 ml  Output 3625 ml  Net -3265 ml   Last 3 Weights 09/27/2021 09/26/2021 09/25/2021  Weight (lbs) 271 lb 6.2 oz 271 lb 8 oz 273 lb  Weight (kg) 123.1 kg 123.152 kg 123.832 kg      Telemetry    SR 50-60s - Personally Reviewed  ECG    No new ECG - Personally Reviewed  Physical Exam   GEN: No acute distress.   Neck: No JVD Cardiac: RRR, no murmurs, rubs, or gallops.  Respiratory: Clear to auscultation bilaterally. GI: Soft, nontender, non-distended  MS: No edema Neuro:  Nonfocal  Psych: Normal affect   Labs    High  Sensitivity Troponin:   Recent Labs  Lab 09/25/21 0207 09/25/21 0415  TROPONINIHS 13 17     Chemistry Recent Labs  Lab 09/24/21 1712 09/25/21 0318 09/26/21 0301 09/27/21 0139  NA 136 138 136 136  K 4.2 4.2 3.7 4.4  CL 99  --  96* 94*  CO2 28  --  32 32  GLUCOSE 117*  --  107* 121*  BUN 21  --  26* 39*  CREATININE 1.63*  --  1.95* 2.58*  CALCIUM 9.4  --  8.7* 8.5*  MG  --   --  2.1 2.2  GFRNONAA 46*  --  37* 26*  ANIONGAP 9  --  8 10    Lipids No results for input(s): CHOL, TRIG, HDL, LABVLDL, LDLCALC, CHOLHDL in the last 168 hours.  Hematology Recent Labs  Lab 09/24/21 1712 09/25/21 0318  WBC 7.4  --   RBC 3.67*  --   HGB 9.8* 12.6*  HCT 32.5* 37.0*  MCV 88.6  --   MCH 26.7  --   MCHC 30.2  --   RDW 15.5  --   PLT 222  --    Thyroid No results  for input(s): TSH, FREET4 in the last 168 hours.  BNP Recent Labs  Lab 09/25/21 0207  BNP 566.6*    DDimer No results for input(s): DDIMER in the last 168 hours.   Radiology    No results found.  Cardiac Studies     Patient Profile     65 y.o. male with a hx of HTN, HFpEF, severe AS s/p TAVR 08/2020, pAF/AFL, DM, CKDIII, R TKA, OSA on CPAP who is being seen 09/26/2021 for the evaluation of decompensated HF  Assessment & Plan     Acute on Chronic Diastolic Heart Failure: Likely due to stopping Lasix as outpatient.  Chest x-ray consistent with progressive CHF.  BNP 567.  Net -2.5 L yesterday, -7.3 L on admission.  Weight is down 11 pounds on admission.  Worsening renal function (creatinine 1.6 > 2.0 > 2.6).   -Given worsening renal function, stopped IV Lasix 40 mg twice daily.  He received his a.m. dose, would hold off on p.m. dose.  Volume status appears fairly euvolemic, suspect can switch to PO lasix tomorrow -Check limited echo  AKI on CKD: Creatinine 1.6 on admission, which appears baseline, has increased to 2.6 with diuresis.  Will back off on diuresis as above.  Paroxysmal atrial fibrillation:  Currently in sinus rhythm -Continue metoprolol, diltiazem, amiodarone, Eliquis  Severe aortic stenosis status post TAVR 08/2020: Echo 08/2021 showed normal functioning bioprosthetic valve  Hypertension: Continue metoprolol, diltiazem, hydralazine.  Hold lisinopril in setting of AKI as above   For questions or updates, please contact [facility] Please consult [email or web address] for contact info under        Signed, Little Ishikawa, MD  09/27/2021, 10:22 AM

## 2021-09-27 NOTE — Progress Notes (Addendum)
Mobility Specialist Progress Note    09/27/21 1339  Mobility  Activity Ambulated in hall  Level of Assistance Standby assist, set-up cues, supervision of patient - no hands on  Assistive Device Front wheel walker  Distance Ambulated (ft) 285 ft  Mobility Ambulated with assistance in hallway  Mobility Response Tolerated fair  Mobility performed by Mobility specialist  $Mobility charge 1 Mobility   Pre-Mobility: 97% SpO2 During Mobility: 85% SpO2 Post-Mobility: 57 HR, 89% SpO2  Pt received sitting EOB and agreeable. Started on RA at 97%. SpO2 briefly dropped to 85% then floated at 87% so RN advised increase to 1LO2 and SpO2 went up to 96%. Took x3 short standing rest breaks to recover. Upon return to room pt removed [place] and sits at 89% laying in bed. RN notified. Left with call bell in reach.   [facility] Mobility Specialist  M.S. Primary Phone: [phone] M.S. Secondary Phone: [phone]

## 2021-09-27 NOTE — TOC Initial Note (Signed)
Transition of Care [facility]) - Initial/Assessment Note    Patient Details  Name: Lawrence Rose MRN: [id number] Date of Birth: 03-13-1956  [facility] CM/SW Contact:    Gala Lewandowsky, RN Phone Number: 09/27/2021, 3:07 PM  Clinical Narrative: Risk for readmission assessment completed. Case Manager spoke with the patient regarding disposition needs. Prior to arrival patient was from home alone. Patient states he has durable medical equipment CPAP and oxygen (2 Liters qhs only) via [employer]. Patient states he has transportation to appointments and that he uses Counsellor in [facility] for medications. Case Manager will continue to follow for increased oxygen and home health needs.              Expected Discharge Plan: Home w Home Health Services Barriers to Discharge: Continued Medical Work up   Patient Goals and CMS Choice Patient states their goals for this hospitalization and ongoing recovery are:: to return home      Expected Discharge Plan and Services Expected Discharge Plan: Home w Home Health Services   Discharge Planning Services: CM Consult   Living arrangements for the past 2 months: Single Family Home                   DME Agency: NA     Prior Living Arrangements/Services Living arrangements for the past 2 months: Single Family Home Lives with:: Self   Do you feel safe going back to the place where you live?: Yes      Need for Family Participation in Patient Care: Yes (Comment) Care giver support system in place?: Yes (comment) Current home services: DME (Patient has walker, cane, cpap, oxgen qhs via [employer].) Criminal Activity/Legal Involvement Pertinent to Current Situation/Hospitalization: No - Comment as needed  Activities of Daily Living Home Assistive Devices/Equipment: Walker (specify type), Cane (specify quad or straight) ADL Screening (condition at time of admission) Patient's cognitive ability adequate to safely complete  daily activities?: Yes Is the patient deaf or have difficulty hearing?: No Does the patient have difficulty seeing, even when wearing glasses/contacts?: Yes Does the patient have difficulty concentrating, remembering, or making decisions?: No Patient able to express need for assistance with ADLs?: Yes Does the patient have difficulty dressing or bathing?: No Independently performs ADLs?: Yes (appropriate for developmental age) Does the patient have difficulty walking or climbing stairs?: Yes Weakness of Legs: Both Weakness of Arms/Hands: Both  Permission Sought/Granted Permission sought to share information with : Family Supports, Magazine features editor, Case Estate manager/land agent granted to share information with : Yes, Verbal Permission Granted       Emotional Assessment Appearance:: Appears stated age Attitude/Demeanor/Rapport: Engaged Affect (typically observed): Appropriate Orientation: : Oriented to Situation, Oriented to  Time, Oriented to Place, Oriented to Self Alcohol / Substance Use: Not Applicable Psych Involvement: No (comment)  Admission diagnosis:  CHF (congestive heart failure) (HCC) [I50.9] CHF exacerbation (HCC) [I50.9] Acute respiratory failure with hypoxia (HCC) [J96.01] Acute on chronic congestive heart failure, unspecified heart failure type (HCC) [I50.9] Patient Active Problem List   Diagnosis Date Noted   CHF exacerbation (HCC) 09/25/2021   Respiratory failure with hypoxia (HCC) 09/25/2021   Chest pain 09/25/2021   Chronic pain syndrome 09/25/2021   CHF (congestive heart failure) (HCC) 09/25/2021   Atrial fibrillation status post cardioversion (HCC) 01/19/2021   (HFpEF) heart failure with preserved ejection fraction (HCC) 01/19/2021   Asymptomatic hypertensive urgency 01/19/2021   HFrEF (heart failure with reduced ejection fraction) (HCC) 01/19/2021   Therapeutic drug  monitoring 12/24/2020   Hypertension associated with diabetes ([place]) 12/24/2020    Morbid obesity ([place]) 10/29/2020   Occult blood positive stool    GI bleed 09/30/2020   Chronic anticoagulation 09/30/2020   Atrial flutter ([place]) 09/30/2020   Diabetes mellitus type 2 in obese ([place]) 09/30/2020   Acute on chronic diastolic (congestive) heart failure ([place]) 09/01/2020   Normocytic anemia 09/01/2020   Pulmonary hypertension, unspecified ([place]) 09/01/2020   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   AKI (acute kidney injury) ([place])    Severe aortic stenosis    Nonrheumatic mitral valve stenosis    CKD (chronic kidney disease) stage 3, GFR 30-59 ml/min ([place]) 08/17/2020   OSA on CPAP 08/17/2020   Obesity, Class III, BMI 40-49.9 (morbid obesity) ([place]) 08/17/2020   PCP:  Doreatha Lew, MD Pharmacy:   [facility], [place] [address] Phone: [phone] Fax: [phone]  Readmission Risk Interventions Readmission Risk Prevention Plan 09/27/2021 01/19/2021  Transportation Screening Complete Complete  PCP or Specialist Appt within 3-5 Days Complete Complete  HRI or Home Care Consult Complete Complete  Social Work Consult for [place] Planning/Counseling Complete Complete  Palliative Care Screening - Not Applicable  Medication Review Press photographer) Complete Complete  Some recent data might be hidden

## 2021-09-27 NOTE — Progress Notes (Signed)
PROGRESS NOTE  Lawrence Rose  [id number] DOB: [birth year] DOA: 09/24/2021 PCP: Doreatha Lew, MD   Brief Narrative: Lawrence Rose is a 65 y.o. male with a history of HFpEF, severe AS s/p TAVR 2021, PAF s/p DCCV, HTN, stage IIIa CKD, T2DM, OSA, and GI bleeding who presented to the ED 12/9 with worsening exertional dyspnea and some orthopnea. He had been taken off lasix 40mg  daily due to rising creatinine as an outpatient prior to this. In the ED he was hypoxic with elevated BNP, SCr 1.63, negative troponin x2. CXR showed cardiomegaly, bibasilar opacities, interstitial prominence, and bilateral pleural effusions. IV lasix was given and admission requested for acute hypoxic respiratory failure due to acute on chronic HFpEF.   Assessment & Plan: Principal Problem:   CHF exacerbation ([place]) Active Problems:   OSA on CPAP   Severe aortic stenosis   AKI (acute kidney injury) ([place])   Acute on chronic diastolic (congestive) heart failure (HCC)   Normocytic anemia   S/P TAVR (transcatheter aortic valve replacement)   Chronic anticoagulation   Diabetes mellitus type 2 in obese (HCC)   Respiratory failure with hypoxia (HCC)   Chest pain   Chronic pain syndrome   CHF (congestive heart failure) (HCC)  Acute on chronic hypoxic respiratory failure due to acute on chronic HFpEF:  - Continues to have brisk diuresis.   - Suspect decompensation due to not being on lasix. Check limited echo per cardiology. - Continue supplemental oxygen to maintain SpO2 >90% and normal respiratory effort. Pt on 2L nocturnal oxygen at baseline, confirmed still hypoxemic during the day with mobility.   AKI on stage IIIa CKD: - Note creatinine rising, possibly prerenal as we are diuresing, though also some concern for relative hypotension which we should avoid. Recheck in AM. Deescalating diuresis after AM dose.   HTN:  - Continuing home metoprolol, diltiazem, hydralazine. Held lisinopril since diuresing, AKI, no  rEF. May need to hold others. Defer to cardiology.  PAF: following TAVR, s/p DCCV, currently NSR.  - Continue home metoprolol, diltiazem, amiodarone, and eliquis.   Stage IIIa CKD:  - Monitor daily, hold metformin. Avoid nephrotoxins  Anemia of CKD: Presumed diagnosis. Chronic normocytic anemia without gross bleeding.  - Monitor H/H  CAD: Widely patent coronaries on cardiac catheterization Nov 2021. Troponins negative.  - Continue plavix, BB, statin  OSA:  - Continue CPAP w/2L O2 (or higher prn) qHS  T2DM: HbA1c 5.2%.  - Hold metformin. No SSI indicated at this time.   Chronic pain syndrome:  - Continue chronic medications.   Obesity: Estimated body mass index is 37.85 kg/m as calculated from the following:   Height as of this encounter: 5\' 11"  (1.803 m).   Weight as of this encounter: 123.1 kg.  DVT prophylaxis: Eliquis Code Status: Full Family Communication: None at the bedside Disposition Plan:  Status is: Inpatient  Remains inpatient appropriate because: IV diuresis  Consultants:  Cardiology  Procedures:  None  Antimicrobials: None   Subjective: Continues to report significant urine output, no chest pain. Dyspnea with exertion is worse than baseline, still hypoxic with exertion. Asked to be put back on oxygen this morning.  Objective: Vitals:   09/26/21 2340 09/27/21 0431 09/27/21 0814 09/27/21 1358  BP:  (!) 112/52 (!) 108/53 (!) 89/46  Pulse: (!) 52 (!) 58 62 (!) 56  Resp: 13 12  14   Temp:  97.7 F (36.5 C)    TempSrc:  Axillary    SpO2: 95% 98% 92% 94%  Weight:  123.1 kg    Height:        Intake/Output Summary (Last 24 hours) at 09/27/2021 1522 Last data filed at 09/27/2021 1037 Gross per 24 hour  Intake 600 ml  Output 4700 ml  Net -4100 ml   Filed Weights   09/25/21 1438 09/26/21 0554 09/27/21 0431  Weight: 123.8 kg 123.2 kg 123.1 kg   Gen: 65 y.o. male in no distress Pulm: Nonlabored breathing room air. CV: Regular rate and rhythm.  No murmur, rub, or gallop. No definite JVD, trace pitting dependent edema. GI: Abdomen soft, non-tender, non-distended, with normoactive bowel sounds.  Ext: Warm, no deformities Skin: No rashes, lesions or ulcers on visualized skin. Neuro: Alert and oriented. No focal neurological deficits. Psych: Judgement and insight appear fair. Mood euthymic & affect congruent. Behavior is appropriate.    Data Reviewed: I have personally reviewed following labs and imaging studies  CBC: Recent Labs  Lab 09/24/21 1712 09/25/21 0318  WBC 7.4  --   HGB 9.8* 12.6*  HCT 32.5* 37.0*  MCV 88.6  --   PLT 222  --    Basic Metabolic Panel: Recent Labs  Lab 09/24/21 1712 09/25/21 0318 09/26/21 0301 09/27/21 0139  NA 136 138 136 136  K 4.2 4.2 3.7 4.4  CL 99  --  96* 94*  CO2 28  --  32 32  GLUCOSE 117*  --  107* 121*  BUN 21  --  26* 39*  CREATININE 1.63*  --  1.95* 2.58*  CALCIUM 9.4  --  8.7* 8.5*  MG  --   --  2.1 2.2  PHOS  --   --  4.5  --    GFR: Estimated Creatinine Clearance: 37.1 mL/min (A) (by C-G formula based on SCr of 2.58 mg/dL (H)). Liver Function Tests: No results for input(s): AST, ALT, ALKPHOS, BILITOT, PROT, ALBUMIN in the last 168 hours. No results for input(s): LIPASE, AMYLASE in the last 168 hours. No results for input(s): AMMONIA in the last 168 hours. Coagulation Profile: No results for input(s): INR, PROTIME in the last 168 hours. Cardiac Enzymes: No results for input(s): CKTOTAL, CKMB, CKMBINDEX, TROPONINI in the last 168 hours. BNP (last 3 results) No results for input(s): PROBNP in the last 8760 hours. HbA1C: Recent Labs    09/26/21 0301  HGBA1C 5.2   CBG: Recent Labs  Lab 09/25/21 1307 09/25/21 1613 09/26/21 0626 09/27/21 0604  GLUCAP 159* 130* 115* 114*   Lipid Profile: No results for input(s): CHOL, HDL, LDLCALC, TRIG, CHOLHDL, LDLDIRECT in the last 72 hours. Thyroid Function Tests: No results for input(s): TSH, T4TOTAL, FREET4, T3FREE,  THYROIDAB in the last 72 hours. Anemia Panel: No results for input(s): VITAMINB12, FOLATE, FERRITIN, TIBC, IRON, RETICCTPCT in the last 72 hours. Urine analysis:    Component Value Date/Time   COLORURINE STRAW (A) 09/25/2021 0839   APPEARANCEUR CLEAR 09/25/2021 0839   LABSPEC 1.006 09/25/2021 0839   PHURINE 5.0 09/25/2021 0839   GLUCOSEU NEGATIVE 09/25/2021 0839   HGBUR SMALL (A) 09/25/2021 0839   BILIRUBINUR NEGATIVE 09/25/2021 0839   KETONESUR 5 (A) 09/25/2021 0839   PROTEINUR 30 (A) 09/25/2021 0839   NITRITE NEGATIVE 09/25/2021 0839   LEUKOCYTESUR NEGATIVE 09/25/2021 0839   Recent Results (from the past 240 hour(s))  Resp Panel by RT-PCR (Flu A&B, Covid) Nasopharyngeal Swab     Status: None   Collection Time: 09/25/21  2:51 AM   Specimen: Nasopharyngeal Swab; Nasopharyngeal(NP) swabs in vial transport medium  Result Value Ref Range Status   SARS Coronavirus 2 by RT PCR NEGATIVE NEGATIVE Final    Comment: (NOTE) SARS-CoV-2 target nucleic acids are NOT DETECTED.  The SARS-CoV-2 RNA is generally detectable in upper respiratory specimens during the acute phase of infection. The lowest concentration of SARS-CoV-2 viral copies this assay can detect is 138 copies/mL. A negative result does not preclude SARS-Cov-2 infection and should not be used as the sole basis for treatment or other patient management decisions. A negative result may occur with  improper specimen collection/handling, submission of specimen other than nasopharyngeal swab, presence of viral mutation(s) within the areas targeted by this assay, and inadequate number of viral copies(<138 copies/mL). A negative result must be combined with clinical observations, patient history, and epidemiological information. The expected result is Negative.  Fact Sheet for Patients:  BloggerCourse.com  Fact Sheet for Healthcare Providers:  SeriousBroker.it  This test is no t  yet approved or cleared by the [place] FDA and  has been authorized for detection and/or diagnosis of SARS-CoV-2 by FDA under an Emergency Use Authorization (EUA). This EUA will remain  in effect (meaning this test can be used) for the duration of the COVID-19 declaration under Section 564(b)(1) of the Act, 21 U.S.C.section 360bbb-3(b)(1), unless the authorization is terminated  or revoked sooner.       Influenza A by PCR NEGATIVE NEGATIVE Final   Influenza B by PCR NEGATIVE NEGATIVE Final    Comment: (NOTE) The Xpert Xpress SARS-CoV-2/FLU/RSV plus assay is intended as an aid in the diagnosis of influenza from Nasopharyngeal swab specimens and should not be used as a sole basis for treatment. Nasal washings and aspirates are unacceptable for Xpert Xpress SARS-CoV-2/FLU/RSV testing.  Fact Sheet for Patients: BloggerCourse.com  Fact Sheet for Healthcare Providers: SeriousBroker.it  This test is not yet approved or cleared by the [place] FDA and has been authorized for detection and/or diagnosis of SARS-CoV-2 by FDA under an Emergency Use Authorization (EUA). This EUA will remain in effect (meaning this test can be used) for the duration of the COVID-19 declaration under Section 564(b)(1) of the Act, 21 U.S.C. section 360bbb-3(b)(1), unless the authorization is terminated or revoked.  Performed at [facility], [address], [place], [place] [address]       Radiology Studies: No results found.  Scheduled Meds:  amiodarone  200 mg Oral Daily   apixaban  5 mg Oral BID   clopidogrel  75 mg Oral q morning   diltiazem  240 mg Oral Daily   gabapentin  600 mg Oral BID   hydrALAZINE  25 mg Oral Q8H   latanoprost  1 drop Both Eyes QHS   metoprolol tartrate  50 mg Oral BID   oxyCODONE  10 mg Oral Q12H   pantoprazole  40 mg Oral BID   polyethylene glycol  17 g Oral Daily   rosuvastatin  20 mg Oral Daily    senna-docusate  1 tablet Oral Daily   sodium chloride flush  3 mL Intravenous Q12H   Continuous Infusions:   LOS: 2 days   Time spent: 25 minutes.  Tyrone Nine, MD [facility] [email or web address] 09/27/2021, 3:22 PM

## 2021-09-27 NOTE — Progress Notes (Signed)
Heart Failure Nurse Navigator Progress Note  PCP: Lenox Ponds, MD PCP-Cardiologist: Izora Ribas Admission Diagnosis: CHF exacerbation Admitted from: home alone  Presentation:   Jun Osment presented 12/9 with increased SOB. Pt resting in bed, completing bedside ECHO. Pt interactive with interview process. Pt taken off lasix by PCP.  Pt states he eats out often, but does not add salt after cooking. Will add salt during cooking process--educated on dietary (sodium and fluid) modifications. Pt states he does not drive often; brother provides transportation. Offered [employer], declined at this time.  Pt states he is in the donut hole for medication costs. Pt requesting shower chair, shower hand rails, toilet riser, and potentially a new walker as his "wheels are going bad" on current walker at home. Also utilizes cane when out in public for ease of transportation.  Explained HV TOC benefits, pt agreed to appt.  ECHO/ LVEF: pending ECHO results today.  60-65%. S/p TAVR.    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 Clifton Briggs and Dr. Laneta Simmers.    Severe aortic stenosis    Sleep apnea    wears CPAP every night     Social History   Socioeconomic History   Marital status: Single    Spouse name: Not on file   Number of children: 0   Years of education: Not on file   Highest education level: Not on file  Occupational History   Occupation: retired  Tobacco Use   Smoking status: Former     Packs/day: 1.00    Years: 15.00    Pack years: 15.00    Types: Cigarettes    Quit date: 10/17/1988    Years since quitting: 32.9   Smokeless tobacco: Never   Tobacco comments:    per patient quit over 30 years ago  Vaping Use   Vaping Use: Never used  Substance and Sexual Activity   Alcohol use: Not Currently   Drug use: Not Currently   Sexual activity: Not on file  Other Topics Concern   Not on file  Social History Narrative   Not on file   Social Determinants of Health   Financial Resource Strain: Medium Risk   Difficulty of Paying Living Expenses: Somewhat hard  Food Insecurity: No Food Insecurity   Worried About Programme researcher, broadcasting/film/video in the Last Year: Never true   Ran Out of Food in the Last Year: Never true  Transportation Needs: No Transportation Needs   Lack of Transportation (Medical): No   Lack of Transportation (Non-Medical): No  Physical Activity: Not on file  Stress: Not on file  Social Connections: Not on file    High Risk Criteria for Readmission and/or Poor Patient Outcomes: Heart failure hospital admissions (last 6 months): 1  No Show rate: 0% Difficult social situation: no Demonstrates medication adherence: yes Primary Language: English Literacy level: able to read/write  and comprehend.   Education Assessment and Provision:  Detailed education and instructions provided on heart failure disease management including the following:  Signs and symptoms of Heart Failure When to call the physician Importance of daily weights Low sodium diet Fluid restriction Medication management Anticipated future follow-up appointments  Patient education given on each of the above topics.  Patient acknowledges understanding via teach back method and acceptance of all instructions.  Education Materials:  "Living Better With Heart Failure" Booklet, HF zone tool, & Daily Weight Tracker Tool.  Patient has scale at home: yes Patient has pill box at home: yes    Barriers of Care:   -dietary indiscretion  Considerations/Referrals:   Referral made to Heart Failure Pharmacist Stewardship: no Referral made to Heart Failure CSW/NCM TOC: yes, appreciated Referral made to Heart & Vascular TOC clinic: yes, 12/20 @ 10AM  Items for Follow-up on DC/TOC: -optimize -med cost/donut hole -dietary modification education -fluid modification education    Pricilla Holm, MSN, RN Heart Failure Nurse Navigator [phone]

## 2021-09-28 ENCOUNTER — Inpatient Hospital Stay (HOSPITAL_COMMUNITY): Payer: Medicare HMO

## 2021-09-28 DIAGNOSIS — I480 Paroxysmal atrial fibrillation: Secondary | ICD-10-CM

## 2021-09-28 DIAGNOSIS — Z952 Presence of prosthetic heart valve: Secondary | ICD-10-CM

## 2021-09-28 LAB — URINALYSIS, COMPLETE (UACMP) WITH MICROSCOPIC
Bilirubin Urine: NEGATIVE
Glucose, UA: NEGATIVE mg/dL
Hgb urine dipstick: NEGATIVE
Ketones, ur: NEGATIVE mg/dL
Leukocytes,Ua: NEGATIVE
Nitrite: NEGATIVE
Protein, ur: NEGATIVE mg/dL
RBC / HPF: NONE SEEN RBC/hpf (ref 0–5)
Specific Gravity, Urine: <1.005 — ABNORMAL LOW (ref 1.005–1.030)
pH: 6 (ref 5.0–8.0)

## 2021-09-28 LAB — RENAL FUNCTION PANEL
Albumin: 2.9 g/dL — ABNORMAL LOW (ref 3.5–5.0)
Anion gap: 13 (ref 5–15)
BUN: 43 mg/dL — ABNORMAL HIGH (ref 8–23)
CO2: 29 mmol/L (ref 22–32)
Calcium: 8.8 mg/dL — ABNORMAL LOW (ref 8.9–10.3)
Chloride: 93 mmol/L — ABNORMAL LOW (ref 98–111)
Creatinine, Ser: 2.91 mg/dL — ABNORMAL HIGH (ref 0.61–1.24)
GFR, Estimated: 23 mL/min — ABNORMAL LOW (ref >60)
Glucose, Bld: 122 mg/dL — ABNORMAL HIGH (ref 70–99)
Phosphorus: 4.4 mg/dL (ref 2.5–4.6)
Potassium: 4.6 mmol/L (ref 3.5–5.1)
Sodium: 135 mmol/L (ref 135–145)

## 2021-09-28 LAB — GLUCOSE, CAPILLARY: Glucose-Capillary: 145 mg/dL — ABNORMAL HIGH (ref 70–99)

## 2021-09-28 LAB — PROTEIN / CREATININE RATIO, URINE
Creatinine, Urine: 89.99 mg/dL
Protein Creatinine Ratio: 0.68 mg/mg{Cre} — ABNORMAL HIGH (ref 0.00–0.15)
Total Protein, Urine: 61 mg/dL

## 2021-09-28 LAB — SODIUM, URINE, RANDOM: Sodium, Ur: 26 mmol/L

## 2021-09-28 NOTE — Consult Note (Signed)
Progress Note  Patient Name: Lawrence Rose Date of Encounter: 09/28/2021  [facility] Cardiologist: Christell Constant, MD   Subjective   Worsening renal function (creatinine 1.6 > 2.0 > 2.6 > 2.9).  He received IV Lasix 40 mg yesterday and was net -3.4 L, -10.7 L on admission.  BP 108/41 today.  Denies any dyspnea or chest pain.  Inpatient Medications    Scheduled Meds:  amiodarone  200 mg Oral Daily   apixaban  5 mg Oral BID   clopidogrel  75 mg Oral q morning   gabapentin  600 mg Oral BID   latanoprost  1 drop Both Eyes QHS   metoprolol tartrate  50 mg Oral BID   oxyCODONE  10 mg Oral Q12H   pantoprazole  40 mg Oral BID   polyethylene glycol  17 g Oral Daily   rosuvastatin  20 mg Oral Daily   senna-docusate  1 tablet Oral Daily   sodium chloride flush  3 mL Intravenous Q12H   Continuous Infusions:  PRN Meds: acetaminophen **OR** acetaminophen, albuterol, alum & mag hydroxide-simeth, HYDROcodone-acetaminophen, neomycin-bacitracin-polymyxin, ondansetron **OR** ondansetron (ZOFRAN) IV   Vital Signs    Vitals:   09/27/21 2300 09/28/21 0500 09/28/21 0951 09/28/21 0952  BP:  (!) 101/46 (!) 108/41 (!) 108/41  Pulse: 79 (!) 58 64 63  Resp: 18 16  16   Temp:  98.3 F (36.8 C)    TempSrc:  Oral    SpO2: 98% 100%  (!) 88%  Weight:  127.1 kg    Height:        Intake/Output Summary (Last 24 hours) at 09/28/2021 1140 Last data filed at 09/28/2021 0954 Gross per 24 hour  Intake 966 ml  Output 2400 ml  Net -1434 ml    Last 3 Weights 09/28/2021 09/27/2021 09/26/2021  Weight (lbs) 280 lb 3.3 oz 271 lb 6.2 oz 271 lb 8 oz  Weight (kg) 127.1 kg 123.1 kg 123.152 kg      Telemetry    SR 60s - Personally Reviewed  ECG    No new ECG - Personally Reviewed  Physical Exam   GEN: No acute distress.   Neck: No JVD Cardiac: RRR, no murmurs, rubs, or gallops.  Respiratory: Clear to auscultation bilaterally. GI: Soft, nontender, non-distended  MS: No  edema Neuro:  Nonfocal  Psych: Normal affect   Labs    High Sensitivity Troponin:   Recent Labs  Lab 09/25/21 0207 09/25/21 0415  TROPONINIHS 13 17      Chemistry Recent Labs  Lab 09/26/21 0301 09/27/21 0139 09/28/21 0320  NA 136 136 135  K 3.7 4.4 4.6  CL 96* 94* 93*  CO2 32 32 29  GLUCOSE 107* 121* 122*  BUN 26* 39* 43*  CREATININE 1.95* 2.58* 2.91*  CALCIUM 8.7* 8.5* 8.8*  MG 2.1 2.2  --   ALBUMIN  --   --  2.9*  GFRNONAA 37* 26* 23*  ANIONGAP 8 10 13      Lipids No results for input(s): CHOL, TRIG, HDL, LABVLDL, LDLCALC, CHOLHDL in the last 168 hours.  Hematology Recent Labs  Lab 09/24/21 1712 09/25/21 0318  WBC 7.4  --   RBC 3.67*  --   HGB 9.8* 12.6*  HCT 32.5* 37.0*  MCV 88.6  --   MCH 26.7  --   MCHC 30.2  --   RDW 15.5  --   PLT 222  --     Thyroid No results for input(s): TSH, FREET4 in  the last 168 hours.  BNP Recent Labs  Lab 09/25/21 0207  BNP 566.6*     DDimer No results for input(s): DDIMER in the last 168 hours.   Radiology    ECHOCARDIOGRAM LIMITED  Result Date: 09/27/2021    ECHOCARDIOGRAM LIMITED REPORT   [name] Date of Exam: 09/27/2021 Medical Rec #:  [id number]      Height:       71.0 in Accession #:    [id number]     Weight:       271.4 lb Date of Birth:  [birth year]      BSA:          2.401 m Patient Age:    65 years       BP:           108/53 mmHg Patient Gender: M              HR:           62 bpm. Exam Location:  Inpatient Procedure: Limited Echo and Limited Color Doppler Indications:    CHF  History:        Patient has prior history of Echocardiogram examinations, most                 recent 08/26/2021. CHF, Arrythmias:Atrial Flutter,                 Signs/Symptoms:Chest Pain; Risk Factors:Hypertension and                 Diabetes. 58mm Medtronic Evolit Aortic valve.  Sonographer:    Wenda Low Referring Phys: [id number] [place]  1. Left ventricular ejection fraction, by  estimation, is 60 to 65%. The left ventricle has normal function. The left ventricle has no regional wall motion abnormalities. There is mild left ventricular hypertrophy. Left ventricular diastolic parameters were normal.  2. Right ventricular systolic function is normal. The right ventricular size is normal.  3. Left atrial size was moderately dilated.  4. Right atrial size was moderately dilated.  5. The pericardial effusion is lateral to the left ventricle.  6. The mitral valve is degenerative. No evidence of mitral valve regurgitation. No evidence of mitral stenosis. Moderate mitral annular calcification.  7. Post 29 mm Medtronic supra annular Core valve stable gradients since 01/19/21 trivial AR likely PVL . The aortic valve has been repaired/replaced. Aortic valve regurgitation is trivial. No aortic stenosis is present.  8. The inferior vena cava is normal in size with greater than 50% respiratory variability, suggesting right atrial pressure of 3 mmHg. FINDINGS  Left Ventricle: Left ventricular ejection fraction, by estimation, is 60 to 65%. The left ventricle has normal function. The left ventricle has no regional wall motion abnormalities. The left ventricular internal cavity size was normal in size. There is  mild left ventricular hypertrophy. Left ventricular diastolic parameters were normal. Right Ventricle: The right ventricular size is normal. No increase in right ventricular wall thickness. Right ventricular systolic function is normal. Left Atrium: Left atrial size was moderately dilated. Right Atrium: Right atrial size was moderately dilated. Pericardium: Trivial pericardial effusion is present. The pericardial effusion is lateral to the left ventricle. Mitral Valve: The mitral valve is degenerative in appearance. There is mild thickening of the mitral valve leaflet(s). There is mild calcification of the mitral valve leaflet(s). Moderate mitral annular calcification. No evidence of mitral valve  stenosis. Tricuspid Valve: The tricuspid valve is normal in structure. Tricuspid valve  regurgitation is not demonstrated. No evidence of tricuspid stenosis. Aortic Valve: Post 29 mm Medtronic supra annular Core valve stable gradients since 01/19/21 trivial AR likely PVL. The aortic valve has been repaired/replaced. Aortic valve regurgitation is trivial. No aortic stenosis is present. Aortic valve mean gradient  measures 15.0 mmHg. Aortic valve peak gradient measures 25.2 mmHg. Aortic valve area, by VTI measures 1.36 cm. Pulmonic Valve: The pulmonic valve was normal in structure. Pulmonic valve regurgitation is not visualized. No evidence of pulmonic stenosis. Aorta: The aortic root is normal in size and structure. Venous: The inferior vena cava is normal in size with greater than 50% respiratory variability, suggesting right atrial pressure of 3 mmHg. IAS/Shunts: No atrial level shunt detected by color flow Doppler. LEFT VENTRICLE PLAX 2D LVIDd:         4.30 cm LVIDs:         2.30 cm LV PW:         1.50 cm LV IVS:        1.40 cm LVOT diam:     1.90 cm LV SV:         88 LV SV Index:   36 LVOT Area:     2.84 cm  LV Volumes (MOD) LV vol d, MOD A2C: 89.6 ml LV vol d, MOD A4C: 84.9 ml LV vol s, MOD A2C: 41.1 ml LV vol s, MOD A4C: 35.4 ml LV SV MOD A2C:     48.5 ml LV SV MOD A4C:     84.9 ml LV SV MOD BP:      51.3 ml LEFT ATRIUM         Index LA diam:    5.40 cm 2.25 cm/m  AORTIC VALVE AV Area (Vmax):    1.39 cm AV Area (Vmean):   1.26 cm AV Area (VTI):     1.36 cm AV Vmax:           251.00 cm/s AV Vmean:          182.000 cm/s AV VTI:            0.644 m AV Peak Grad:      25.2 mmHg AV Mean Grad:      15.0 mmHg LVOT Vmax:         123.00 cm/s LVOT Vmean:        80.800 cm/s LVOT VTI:          0.309 m LVOT/AV VTI ratio: 0.48  SHUNTS Systemic VTI:  0.31 m Systemic Diam: 1.90 cm Jenkins Rouge MD Electronically signed by Jenkins Rouge MD Signature Date/Time: 09/27/2021/4:34:21 PM    Final     Cardiac Studies   Echo  09/27/21:  1. Left ventricular ejection fraction, by estimation, is 60 to 65%. The  left ventricle has normal function. The left ventricle has no regional  wall motion abnormalities. There is mild left ventricular hypertrophy.  Left ventricular diastolic parameters  were normal.   2. Right ventricular systolic function is normal. The right ventricular  size is normal.   3. Left atrial size was moderately dilated.   4. Right atrial size was moderately dilated.   5. The pericardial effusion is lateral to the left ventricle.   6. The mitral valve is degenerative. No evidence of mitral valve  regurgitation. No evidence of mitral stenosis. Moderate mitral annular  calcification.   7. Post 29 mm Medtronic supra annular Core valve stable gradients since  01/19/21 trivial AR likely PVL . The aortic valve has  been  repaired/replaced. Aortic valve regurgitation is trivial. No aortic  stenosis is present.   8. The inferior vena cava is normal in size with greater than 50%  respiratory variability, suggesting right atrial pressure of 3 mmHg.   Patient Profile     65 y.o. male with a hx of HTN, HFpEF, severe AS s/p TAVR 08/2020, pAF/AFL, DM, CKDIII, R TKA, OSA on CPAP who is being seen 09/26/2021 for the evaluation of decompensated HF  Assessment & Plan     Acute on Chronic Diastolic Heart Failure: Likely due to stopping Lasix as outpatient.  Chest x-ray consistent with progressive CHF.  BNP 567.  Net -2.5 L yesterday, -7.3 L on admission.  Worsening renal function (creatinine 1.6 > 2.0 > 2.6>2.9).   -Given worsening renal function, stopped IV Lasix yesterday.  Would hold off on lasix today, he appears euvolemic.    AKI on CKD: Creatinine 1.6 on admission, which appears baseline, has increased to 2.9, likely due to overdiuresis.  Hold diuresis, may need gentle IV fluids.  Paroxysmal atrial fibrillation: Currently in sinus rhythm -Continue metoprolol, amiodarone, Eliquis.  Hold diltiazem given soft  pressures  Severe aortic stenosis status post TAVR 08/2020: Echo showed normal functioning bioprosthetic valve  Hypertension: BP has been soft.  Holding home lisinopril.  Will also hold diltiazem and hydralazine.  Can continue metoprolol.     For questions or updates, please contact [place] Please consult [email or web address] for contact info under        Signed, Donato Heinz, MD  09/28/2021, 11:40 AM

## 2021-09-28 NOTE — Progress Notes (Signed)
PROGRESS NOTE  Lawrence Rose  [id number] DOB: 14-Nov-1955 DOA: 09/24/2021 PCP: Doreatha Lew, MD   Brief Narrative: Lawrence Rose is a 65 y.o. male with a history of HFpEF, severe AS s/p TAVR 2021, PAF s/p DCCV, HTN, stage IIIa CKD, T2DM, OSA, and GI bleeding who presented to the ED 12/9 with worsening exertional dyspnea and some orthopnea. He had been taken off lasix 40mg  daily due to rising creatinine as an outpatient prior to this. In the ED he was hypoxic with elevated BNP, SCr 1.63, negative troponin x2. CXR showed cardiomegaly, bibasilar opacities, interstitial prominence, and bilateral pleural effusions. IV lasix was given and admission requested for acute hypoxic respiratory failure due to acute on chronic HFpEF.   Assessment & Plan: Principal Problem:   CHF exacerbation ([place]) Active Problems:   OSA on CPAP   Severe aortic stenosis   AKI (acute kidney injury) ([place])   Acute on chronic diastolic (congestive) heart failure (HCC)   Normocytic anemia   S/P TAVR (transcatheter aortic valve replacement)   Chronic anticoagulation   Diabetes mellitus type 2 in obese (HCC)   PAF (paroxysmal atrial fibrillation) (HCC)   Respiratory failure with hypoxia (HCC)   Chest pain   Chronic pain syndrome   CHF (congestive heart failure) ([place])  Acute on chronic hypoxic respiratory failure due to acute on chronic HFpEF:  - Continued to have brisk diuresis even with only one dose of lasix 12/12. Holding lasix today. Appears euvolemic. Small/collapsible IVC on echo yesterday. D/w Dr. Gardiner Rose. We may need to give back some IVF if he autodiureses further today.  - Suspect decompensation due to not being on lasix. C  - Continue supplemental oxygen to maintain SpO2 >90% and normal respiratory effort. Pt on 2L nocturnal oxygen at baseline. Check ambulatory pulse oximetry prior to discharge.   AKI on stage IIIa CKD: Worsening again today.  - Stop lasix - Consider IVF if needed to keep net even  today - Concern for ATN as well as over diuresis. Will check urine studies, renal U/S.  - Avoid hypotension (medications held as below)  HTN:  - Continuing home metoprolol. Held lisinopril since diuresing, AKI, no rEF. Holding diltiazem, hydralazine as well now per cardiology.  PAF: following TAVR, s/p DCCV, currently NSR.  - Continue home metoprolol, amiodarone, and eliquis.   Anemia of CKD: Presumed diagnosis. Chronic normocytic anemia without gross bleeding.  - Monitor H/H  CAD: Widely patent coronaries on cardiac catheterization Nov 2021. Troponins negative.  - Continue plavix, BB, statin  OSA:  - Continue CPAP w/2L O2 (or higher prn) qHS  T2DM: HbA1c 5.2%.  - Hold metformin. No SSI indicated at this time.   Chronic pain syndrome:  - Continue chronic medications.   Obesity: Estimated body mass index is 39.08 kg/m as calculated from the following:   Height as of this encounter: 5\' 11"  (1.803 m).   Weight as of this encounter: 127.1 kg.  DVT prophylaxis: Eliquis Code Status: Full Family Communication: None at the bedside Disposition Plan:  Status is: Inpatient  Remains inpatient appropriate because: AKI work up, hypotension, need to monitor renal function an additional day. Possibly DC home 12/14.   Consultants:  Cardiology  Procedures:  None  Antimicrobials: None   Subjective: Has still been urinating much more than usual over past 24 hours. Feels great. No dyspnea or pain. Swelling is gone. Wants to go home as soon as possible.   Objective: Vitals:   09/27/21 2300 09/28/21 0500 09/28/21 0951 09/28/21  0952  BP:  (!) 101/46 (!) 108/41 (!) 108/41  Pulse: 79 (!) 58 64 63  Resp: 18 16  16   Temp:  98.3 F (36.8 C)    TempSrc:  Oral    SpO2: 98% 100%  (!) 88%  Weight:  127.1 kg    Height:        Intake/Output Summary (Last 24 hours) at 09/28/2021 1239 Last data filed at 09/28/2021 0954 Gross per 24 hour  Intake 726 ml  Output 2400 ml  Net -1674 ml    Filed Weights   09/26/21 0554 09/27/21 0431 09/28/21 0500  Weight: 123.2 kg 123.1 kg 127.1 kg   Gen: 65 y.o. male in no distress Pulm: Nonlabored breathing room air. Clear. CV: Regular rate and rhythm. No murmur, rub, or gallop. No definite JVD, no pitting dependent edema. GI: Abdomen soft, non-tender, non-distended, with normoactive bowel sounds.  Ext: Warm, no deformities Skin: No new rashes, lesions or ulcers on visualized skin. Neuro: Alert and oriented. No focal neurological deficits. Psych: Judgement and insight appear fair. Mood euthymic & affect congruent. Behavior is appropriate.    Data Reviewed: I have personally reviewed following labs and imaging studies  CBC: Recent Labs  Lab 09/24/21 1712 09/25/21 0318  WBC 7.4  --   HGB 9.8* 12.6*  HCT 32.5* 37.0*  MCV 88.6  --   PLT 222  --    Basic Metabolic Panel: Recent Labs  Lab 09/24/21 1712 09/25/21 0318 09/26/21 0301 09/27/21 0139 09/28/21 0320  NA 136 138 136 136 135  K 4.2 4.2 3.7 4.4 4.6  CL 99  --  96* 94* 93*  CO2 28  --  32 32 29  GLUCOSE 117*  --  107* 121* 122*  BUN 21  --  26* 39* 43*  CREATININE 1.63*  --  1.95* 2.58* 2.91*  CALCIUM 9.4  --  8.7* 8.5* 8.8*  MG  --   --  2.1 2.2  --   PHOS  --   --  4.5  --  4.4   GFR: Estimated Creatinine Clearance: 33.4 mL/min (A) (by C-G formula based on SCr of 2.91 mg/dL (H)). Liver Function Tests: Recent Labs  Lab 09/28/21 0320  ALBUMIN 2.9*   No results for input(s): LIPASE, AMYLASE in the last 168 hours. No results for input(s): AMMONIA in the last 168 hours. Coagulation Profile: No results for input(s): INR, PROTIME in the last 168 hours. Cardiac Enzymes: No results for input(s): CKTOTAL, CKMB, CKMBINDEX, TROPONINI in the last 168 hours. BNP (last 3 results) No results for input(s): PROBNP in the last 8760 hours. HbA1C: Recent Labs    09/26/21 0301  HGBA1C 5.2   CBG: Recent Labs  Lab 09/25/21 1307 09/25/21 1613 09/26/21 0626  09/27/21 0604 09/28/21 0535  GLUCAP 159* 130* 115* 114* 145*   Lipid Profile: No results for input(s): CHOL, HDL, LDLCALC, TRIG, CHOLHDL, LDLDIRECT in the last 72 hours. Thyroid Function Tests: No results for input(s): TSH, T4TOTAL, FREET4, T3FREE, THYROIDAB in the last 72 hours. Anemia Panel: No results for input(s): VITAMINB12, FOLATE, FERRITIN, TIBC, IRON, RETICCTPCT in the last 72 hours. Urine analysis:    Component Value Date/Time   COLORURINE STRAW (A) 09/25/2021 0839   APPEARANCEUR CLEAR 09/25/2021 0839   LABSPEC 1.006 09/25/2021 0839   PHURINE 5.0 09/25/2021 0839   GLUCOSEU NEGATIVE 09/25/2021 0839   HGBUR SMALL (A) 09/25/2021 0839   BILIRUBINUR NEGATIVE 09/25/2021 0839   KETONESUR 5 (A) 09/25/2021 0839   PROTEINUR  30 (A) 09/25/2021 0839   NITRITE NEGATIVE 09/25/2021 0839   LEUKOCYTESUR NEGATIVE 09/25/2021 0839   Recent Results (from the past 240 hour(s))  Resp Panel by RT-PCR (Flu A&B, Covid) Nasopharyngeal Swab     Status: None   Collection Time: 09/25/21  2:51 AM   Specimen: Nasopharyngeal Swab; Nasopharyngeal(NP) swabs in vial transport medium  Result Value Ref Range Status   SARS Coronavirus 2 by RT PCR NEGATIVE NEGATIVE Final    Comment: (NOTE) SARS-CoV-2 target nucleic acids are NOT DETECTED.  The SARS-CoV-2 RNA is generally detectable in upper respiratory specimens during the acute phase of infection. The lowest concentration of SARS-CoV-2 viral copies this assay can detect is 138 copies/mL. A negative result does not preclude SARS-Cov-2 infection and should not be used as the sole basis for treatment or other patient management decisions. A negative result may occur with  improper specimen collection/handling, submission of specimen other than nasopharyngeal swab, presence of viral mutation(s) within the areas targeted by this assay, and inadequate number of viral copies(<138 copies/mL). A negative result must be combined with clinical observations,  patient history, and epidemiological information. The expected result is Negative.  Fact Sheet for Patients:  EntrepreneurPulse.com.au  Fact Sheet for Healthcare Providers:  IncredibleEmployment.be  This test is no t yet approved or cleared by the [place] FDA and  has been authorized for detection and/or diagnosis of SARS-CoV-2 by FDA under an Emergency Use Authorization (EUA). This EUA will remain  in effect (meaning this test can be used) for the duration of the COVID-19 declaration under Section 564(b)(1) of the Act, 21 U.S.C.section 360bbb-3(b)(1), unless the authorization is terminated  or revoked sooner.       Influenza A by PCR NEGATIVE NEGATIVE Final   Influenza B by PCR NEGATIVE NEGATIVE Final    Comment: (NOTE) The Xpert Xpress SARS-CoV-2/FLU/RSV plus assay is intended as an aid in the diagnosis of influenza from Nasopharyngeal swab specimens and should not be used as a sole basis for treatment. Nasal washings and aspirates are unacceptable for Xpert Xpress SARS-CoV-2/FLU/RSV testing.  Fact Sheet for Patients: EntrepreneurPulse.com.au  Fact Sheet for Healthcare Providers: IncredibleEmployment.be  This test is not yet approved or cleared by the [place] FDA and has been authorized for detection and/or diagnosis of SARS-CoV-2 by FDA under an Emergency Use Authorization (EUA). This EUA will remain in effect (meaning this test can be used) for the duration of the COVID-19 declaration under Section 564(b)(1) of the Act, 21 U.S.C. section 360bbb-3(b)(1), unless the authorization is terminated or revoked.  Performed at [facility], [place] [address], [place], [place] [address]       Radiology Studies: ECHOCARDIOGRAM LIMITED  Result Date: 09/27/2021    ECHOCARDIOGRAM LIMITED REPORT   [name] Date of Exam: 09/27/2021 Medical Rec #:  [id number]      Height:        71.0 in Accession #:    [id number]     Weight:       271.4 lb Date of Birth:  1955/12/03      BSA:          2.401 m Patient Age:    34 years       BP:           108/53 mmHg Patient Gender: M              HR:           62 bpm. Exam Location:  Inpatient Procedure: Limited Echo and Limited Color Doppler Indications:    CHF  History:        Patient has prior history of Echocardiogram examinations, most                 recent 08/26/2021. CHF, Arrythmias:Atrial Flutter,                 Signs/Symptoms:Chest Pain; Risk Factors:Hypertension and                 Diabetes. 29mm Medtronic Evolit Aortic valve.  Sonographer:    Mikki Harbororothy Buchanan Referring Phys: [id number] CHRISTOPHER L SCHUMANN IMPRESSIONS  1. Left ventricular ejection fraction, by estimation, is 60 to 65%. The left ventricle has normal function. The left ventricle has no regional wall motion abnormalities. There is mild left ventricular hypertrophy. Left ventricular diastolic parameters were normal.  2. Right ventricular systolic function is normal. The right ventricular size is normal.  3. Left atrial size was moderately dilated.  4. Right atrial size was moderately dilated.  5. The pericardial effusion is lateral to the left ventricle.  6. The mitral valve is degenerative. No evidence of mitral valve regurgitation. No evidence of mitral stenosis. Moderate mitral annular calcification.  7. Post 29 mm Medtronic supra annular Core valve stable gradients since 01/19/21 trivial AR likely PVL . The aortic valve has been repaired/replaced. Aortic valve regurgitation is trivial. No aortic stenosis is present.  8. The inferior vena cava is normal in size with greater than 50% respiratory variability, suggesting right atrial pressure of 3 mmHg. FINDINGS  Left Ventricle: Left ventricular ejection fraction, by estimation, is 60 to 65%. The left ventricle has normal function. The left ventricle has no regional wall motion abnormalities. The left ventricular internal cavity size  was normal in size. There is  mild left ventricular hypertrophy. Left ventricular diastolic parameters were normal. Right Ventricle: The right ventricular size is normal. No increase in right ventricular wall thickness. Right ventricular systolic function is normal. Left Atrium: Left atrial size was moderately dilated. Right Atrium: Right atrial size was moderately dilated. Pericardium: Trivial pericardial effusion is present. The pericardial effusion is lateral to the left ventricle. Mitral Valve: The mitral valve is degenerative in appearance. There is mild thickening of the mitral valve leaflet(s). There is mild calcification of the mitral valve leaflet(s). Moderate mitral annular calcification. No evidence of mitral valve stenosis. Tricuspid Valve: The tricuspid valve is normal in structure. Tricuspid valve regurgitation is not demonstrated. No evidence of tricuspid stenosis. Aortic Valve: Post 29 mm Medtronic supra annular Core valve stable gradients since 01/19/21 trivial AR likely PVL. The aortic valve has been repaired/replaced. Aortic valve regurgitation is trivial. No aortic stenosis is present. Aortic valve mean gradient  measures 15.0 mmHg. Aortic valve peak gradient measures 25.2 mmHg. Aortic valve area, by VTI measures 1.36 cm. Pulmonic Valve: The pulmonic valve was normal in structure. Pulmonic valve regurgitation is not visualized. No evidence of pulmonic stenosis. Aorta: The aortic root is normal in size and structure. Venous: The inferior vena cava is normal in size with greater than 50% respiratory variability, suggesting right atrial pressure of 3 mmHg. IAS/Shunts: No atrial level shunt detected by color flow Doppler. LEFT VENTRICLE PLAX 2D LVIDd:         4.30 cm LVIDs:         2.30 cm LV PW:         1.50 cm LV IVS:        1.40 cm LVOT diam:  1.90 cm LV SV:         88 LV SV Index:   36 LVOT Area:     2.84 cm  LV Volumes (MOD) LV vol d, MOD A2C: 89.6 ml LV vol d, MOD A4C: 84.9 ml LV vol s, MOD  A2C: 41.1 ml LV vol s, MOD A4C: 35.4 ml LV SV MOD A2C:     48.5 ml LV SV MOD A4C:     84.9 ml LV SV MOD BP:      51.3 ml LEFT ATRIUM         Index LA diam:    5.40 cm 2.25 cm/m  AORTIC VALVE AV Area (Vmax):    1.39 cm AV Area (Vmean):   1.26 cm AV Area (VTI):     1.36 cm AV Vmax:           251.00 cm/s AV Vmean:          182.000 cm/s AV VTI:            0.644 m AV Peak Grad:      25.2 mmHg AV Mean Grad:      15.0 mmHg LVOT Vmax:         123.00 cm/s LVOT Vmean:        80.800 cm/s LVOT VTI:          0.309 m LVOT/AV VTI ratio: 0.48  SHUNTS Systemic VTI:  0.31 m Systemic Diam: 1.90 cm Charlton Haws MD Electronically signed by Charlton Haws MD Signature Date/Time: 09/27/2021/4:34:21 PM    Final     Scheduled Meds:  amiodarone  200 mg Oral Daily   apixaban  5 mg Oral BID   clopidogrel  75 mg Oral q morning   gabapentin  600 mg Oral BID   latanoprost  1 drop Both Eyes QHS   metoprolol tartrate  50 mg Oral BID   oxyCODONE  10 mg Oral Q12H   pantoprazole  40 mg Oral BID   polyethylene glycol  17 g Oral Daily   rosuvastatin  20 mg Oral Daily   senna-docusate  1 tablet Oral Daily   sodium chloride flush  3 mL Intravenous Q12H   Continuous Infusions:   LOS: 3 days   Time spent: 35 minutes.  Tyrone Nine, MD [facility] [email or web address] 09/28/2021, 12:39 PM

## 2021-09-28 NOTE — Progress Notes (Signed)
Mobility Specialist Progress Note    09/28/21 1025  Mobility  Activity Ambulated in hall  Level of Assistance Standby assist, set-up cues, supervision of patient - no hands on  Assistive Device Front wheel walker  Distance Ambulated (ft) 340 ft  Mobility Ambulated with assistance in hallway  Mobility Response Tolerated fair  Mobility performed by Mobility specialist  Bed Position Chair  $Mobility charge 1 Mobility   Post-Mobility: 63 HR, 117/49 BP, 89% SpO2  Pt received in bed and agreeable. C/o 8/10 L knee pain and general tiredness and weakness. Took x1 standing break leaning against the wall before turning around and x2 leaning breaks on the walker on the way back to the room. SpO2 fluctuated throughout the 80s and low 90s during walk. RN notified. Left in chair with call bell in reach.   [facility] Mobility Specialist  M.S. Primary Phone: [phone] M.S. Secondary Phone: [phone]

## 2021-09-29 ENCOUNTER — Inpatient Hospital Stay (HOSPITAL_COMMUNITY): Payer: Medicare HMO

## 2021-09-29 LAB — RENAL FUNCTION PANEL
Albumin: 2.8 g/dL — ABNORMAL LOW (ref 3.5–5.0)
Anion gap: 6 (ref 5–15)
BUN: 42 mg/dL — ABNORMAL HIGH (ref 8–23)
CO2: 34 mmol/L — ABNORMAL HIGH (ref 22–32)
Calcium: 8.7 mg/dL — ABNORMAL LOW (ref 8.9–10.3)
Chloride: 96 mmol/L — ABNORMAL LOW (ref 98–111)
Creatinine, Ser: 2.35 mg/dL — ABNORMAL HIGH (ref 0.61–1.24)
GFR, Estimated: 30 mL/min — ABNORMAL LOW (ref >60)
Glucose, Bld: 112 mg/dL — ABNORMAL HIGH (ref 70–99)
Phosphorus: 4 mg/dL (ref 2.5–4.6)
Potassium: 4.1 mmol/L (ref 3.5–5.1)
Sodium: 136 mmol/L (ref 135–145)

## 2021-09-29 LAB — BLOOD GAS, ARTERIAL
Acid-Base Excess: 4.6 mmol/L — ABNORMAL HIGH (ref 0.0–2.0)
Acid-Base Excess: 3.6 mmol/L — ABNORMAL HIGH (ref 0.0–2.0)
Bicarbonate: 29.7 mmol/L — ABNORMAL HIGH (ref 20.0–28.0)
Bicarbonate: 28.3 mmol/L — ABNORMAL HIGH (ref 20.0–28.0)
FIO2: 32
FIO2: 40
O2 Saturation: 75.3 %
O2 Saturation: 98.1 %
Patient temperature: 39.4
Patient temperature: 38
pCO2 arterial: 60.4 mmHg — ABNORMAL HIGH (ref 32.0–48.0)
pCO2 arterial: 50.4 mmHg — ABNORMAL HIGH (ref 32.0–48.0)
pH, Arterial: 7.326 — ABNORMAL LOW (ref 7.350–7.450)
pH, Arterial: 7.373 (ref 7.350–7.450)
pO2, Arterial: 51.9 mmHg — ABNORMAL LOW (ref 83.0–108.0)
pO2, Arterial: 111 mmHg — ABNORMAL HIGH (ref 83.0–108.0)

## 2021-09-29 LAB — GLUCOSE, CAPILLARY
Glucose-Capillary: 134 mg/dL — ABNORMAL HIGH (ref 70–99)
Glucose-Capillary: 150 mg/dL — ABNORMAL HIGH (ref 70–99)
Glucose-Capillary: 127 mg/dL — ABNORMAL HIGH (ref 70–99)

## 2021-09-29 LAB — CBC
HCT: 36.5 % — ABNORMAL LOW (ref 39.0–52.0)
Hemoglobin: 11.3 g/dL — ABNORMAL LOW (ref 13.0–17.0)
MCH: 27.2 pg (ref 26.0–34.0)
MCHC: 31 g/dL (ref 30.0–36.0)
MCV: 87.7 fL (ref 80.0–100.0)
Platelets: 264 10*3/uL (ref 150–400)
RBC: 4.16 MIL/uL — ABNORMAL LOW (ref 4.22–5.81)
RDW: 14.9 % (ref 11.5–15.5)
WBC: 13.8 10*3/uL — ABNORMAL HIGH (ref 4.0–10.5)
nRBC: 0 % (ref 0.0–0.2)

## 2021-09-29 LAB — URINALYSIS, MICROSCOPIC (REFLEX): WBC, UA: >50 WBC/hpf (ref 0–5)

## 2021-09-29 LAB — URINALYSIS, ROUTINE W REFLEX MICROSCOPIC
Bilirubin Urine: NEGATIVE
Glucose, UA: NEGATIVE mg/dL
Ketones, ur: NEGATIVE mg/dL
Nitrite: NEGATIVE
Protein, ur: NEGATIVE mg/dL
Specific Gravity, Urine: 1.015 (ref 1.005–1.030)
pH: 7.5 (ref 5.0–8.0)

## 2021-09-29 LAB — PROCALCITONIN: Procalcitonin: 1.7 ng/mL

## 2021-09-29 LAB — LACTIC ACID, PLASMA: Lactic Acid, Venous: 1.6 mmol/L (ref 0.5–1.9)

## 2021-09-29 MED ORDER — VANCOMYCIN HCL 1500 MG/300ML IV SOLN
1500.0000 mg | INTRAVENOUS | Status: DC
Start: 2021-09-30 — End: 2021-10-01
  Administered 2021-09-30: 1500 mg via INTRAVENOUS
  Filled 2021-09-29 (×2): qty 300

## 2021-09-29 MED ORDER — SODIUM CHLORIDE 0.9 % IV SOLN: 2.0000 g | Freq: Once | INTRAVENOUS | Status: DC

## 2021-09-29 MED ORDER — METOPROLOL TARTRATE 25 MG PO TABS: 25.0000 mg | ORAL_TABLET | Freq: Two times a day (BID) | ORAL | Status: DC

## 2021-09-29 MED ORDER — SODIUM CHLORIDE 0.9 % IV SOLN: INTRAVENOUS | Status: DC

## 2021-09-29 MED ORDER — SODIUM CHLORIDE 0.9 % IV BOLUS
500.0000 mL | Freq: Once | INTRAVENOUS | Status: AC
  Administered 2021-09-29: 14:00:00 500 mL via INTRAVENOUS

## 2021-09-29 MED ORDER — SODIUM CHLORIDE 0.9 % IV SOLN
2.0000 g | Freq: Two times a day (BID) | INTRAVENOUS | Status: AC
  Administered 2021-09-29 – 2021-10-03 (×10): 2 g via INTRAVENOUS
  Filled 2021-09-29 (×10): qty 2

## 2021-09-29 MED ORDER — IBUPROFEN 600 MG PO TABS
600.0000 mg | ORAL_TABLET | Freq: Four times a day (QID) | ORAL | Status: DC | PRN
  Administered 2021-09-29: 19:00:00 600 mg via ORAL
  Filled 2021-09-29: qty 1

## 2021-09-29 MED ORDER — VANCOMYCIN HCL 2000 MG/400ML IV SOLN
2000.0000 mg | Freq: Once | INTRAVENOUS | Status: AC
  Administered 2021-09-29: 13:00:00 2000 mg via INTRAVENOUS
  Filled 2021-09-29: qty 400

## 2021-09-29 MED ORDER — SODIUM CHLORIDE 0.9 % IV SOLN: 2.0000 g | Freq: Two times a day (BID) | INTRAVENOUS | Status: DC

## 2021-09-29 NOTE — Progress Notes (Signed)
Progress Note  Patient Name: Lawrence Rose Date of Encounter: 09/29/2021  [facility] Cardiologist: Werner Lean, MD   Subjective   Renal function improved today ((creatinine 1.6 > 2.0 > 2.6 > 2.9>2.4).  BP 116/52 today.  Having chills this morning.  Inpatient Medications    Scheduled Meds:  amiodarone  200 mg Oral Daily   apixaban  5 mg Oral BID   clopidogrel  75 mg Oral q morning   gabapentin  600 mg Oral BID   latanoprost  1 drop Both Eyes QHS   metoprolol tartrate  50 mg Oral BID   oxyCODONE  10 mg Oral Q12H   pantoprazole  40 mg Oral BID   polyethylene glycol  17 g Oral Daily   rosuvastatin  20 mg Oral Daily   senna-docusate  1 tablet Oral Daily   sodium chloride flush  3 mL Intravenous Q12H   Continuous Infusions:  PRN Meds: acetaminophen **OR** acetaminophen, albuterol, alum & mag hydroxide-simeth, HYDROcodone-acetaminophen, neomycin-bacitracin-polymyxin, ondansetron **OR** ondansetron (ZOFRAN) IV   Vital Signs    Vitals:   09/28/21 2309 09/29/21 0344 09/29/21 0352 09/29/21 0947  BP:  (!) 116/52    Pulse: 63 61    Resp: 16 14    Temp:  97.9 F (36.6 C)  98.2 F (36.8 C)  TempSrc:  Oral  Oral  SpO2: 99% 95%    Weight:   122.6 kg   Height:        Intake/Output Summary (Last 24 hours) at 09/29/2021 0956 Last data filed at 09/29/2021 0946 Gross per 24 hour  Intake 240 ml  Output 4875 ml  Net -4635 ml   Last 3 Weights 09/29/2021 09/28/2021 09/27/2021  Weight (lbs) 270 lb 4.8 oz 280 lb 3.3 oz 271 lb 6.2 oz  Weight (kg) 122.607 kg 127.1 kg 123.1 kg      Telemetry    NSR 60s - Personally Reviewed  ECG    No new ECG - Personally Reviewed  Physical Exam   GEN: appears uncomfortable  Neck: No JVD Cardiac: RRR, no murmurs, rubs, or gallops.  Respiratory: Clear to auscultation bilaterally. GI: Soft, nontender, non-distended  MS: No edema; No deformity. Neuro:  Nonfocal  Psych: Normal affect   Labs    High Sensitivity  Troponin:   Recent Labs  Lab 09/25/21 0207 09/25/21 0415  TROPONINIHS 13 17     Chemistry Recent Labs  Lab 09/26/21 0301 09/27/21 0139 09/28/21 0320 09/29/21 0251  NA 136 136 135 136  K 3.7 4.4 4.6 4.1  CL 96* 94* 93* 96*  CO2 32 32 29 34*  GLUCOSE 107* 121* 122* 112*  BUN 26* 39* 43* 42*  CREATININE 1.95* 2.58* 2.91* 2.35*  CALCIUM 8.7* 8.5* 8.8* 8.7*  MG 2.1 2.2  --   --   ALBUMIN  --   --  2.9* 2.8*  GFRNONAA 37* 26* 23* 30*  ANIONGAP 8 10 13 6     Lipids No results for input(s): CHOL, TRIG, HDL, LABVLDL, LDLCALC, CHOLHDL in the last 168 hours.  Hematology Recent Labs  Lab 09/24/21 1712 09/25/21 0318  WBC 7.4  --   RBC 3.67*  --   HGB 9.8* 12.6*  HCT 32.5* 37.0*  MCV 88.6  --   MCH 26.7  --   MCHC 30.2  --   RDW 15.5  --   PLT 222  --    Thyroid No results for input(s): TSH, FREET4 in the last 168 hours.  BNP Recent  Labs  Lab 09/25/21 0207  BNP 566.6*    DDimer No results for input(s): DDIMER in the last 168 hours.   Radiology    US RENAL  Result Date: 09/28/2021 CLINICAL DATA:  Acute renal insufficiency EXAM: RENAL / URINARY TRACT ULTRASOUND COMPLETE COMPARISON:  10/28/2020 abdominal MRI. FINDINGS: Right Kidney: Renal measurements: 10.5 x 6.0 x 5.0 cm = volume: 167 mL. No hydronephrosis. Normal renal echogenicity. Interpolar right renal 1.0 cm cyst. Left Kidney: Renal measurements: 11.7 x 5.9 x 6.1 cm = volume: 217 mL. Normal renal echogenicity. No hydronephrosis. Bladder: Appears normal for degree of bladder distention. Other: Incidental note is made of soft tissue echogenicity within the gallbladder lumen at 2.6 cm. New since MRI of 10/28/2020. No gallbladder wall thickening or pericholecystic fluid. IMPRESSION: 1.  No acute process or explanation for renal insufficiency. 2. Soft tissue echogenicity within the gallbladder is favored to represent tumefactive sludge, given development since January. Recommend dedicated right upper quadrant ultrasound  follow-up at 3 months. Electronically Signed   By: Abigail Miyamoto M.D.   On: 09/28/2021 16:58   ECHOCARDIOGRAM LIMITED  Result Date: 09/27/2021    ECHOCARDIOGRAM LIMITED REPORT   Patient Name:   DEDRICK Rose Date of Exam: 09/27/2021 Medical Rec #:  [id number]      Height:       71.0 in Accession #:    [id number]     Weight:       271.4 lb Date of Birth:  Oct 11, 1956      BSA:          2.401 m Patient Age:    65 years       BP:           108/53 mmHg Patient Gender: M              HR:           62 bpm. Exam Location:  Inpatient Procedure: Limited Echo and Limited Color Doppler Indications:    CHF  History:        Patient has prior history of Echocardiogram examinations, most                 recent 08/26/2021. CHF, Arrythmias:Atrial Flutter,                 Signs/Symptoms:Chest Pain; Risk Factors:Hypertension and                 Diabetes. 14mm Medtronic Evolit Aortic valve.  Sonographer:    Wenda Low Referring Phys: [id number] [place]  1. Left ventricular ejection fraction, by estimation, is 60 to 65%. The left ventricle has normal function. The left ventricle has no regional wall motion abnormalities. There is mild left ventricular hypertrophy. Left ventricular diastolic parameters were normal.  2. Right ventricular systolic function is normal. The right ventricular size is normal.  3. Left atrial size was moderately dilated.  4. Right atrial size was moderately dilated.  5. The pericardial effusion is lateral to the left ventricle.  6. The mitral valve is degenerative. No evidence of mitral valve regurgitation. No evidence of mitral stenosis. Moderate mitral annular calcification.  7. Post 29 mm Medtronic supra annular Core valve stable gradients since 01/19/21 trivial AR likely PVL . The aortic valve has been repaired/replaced. Aortic valve regurgitation is trivial. No aortic stenosis is present.  8. The inferior vena cava is normal in size with greater than 50% respiratory  variability, suggesting right atrial pressure of 3 mmHg. FINDINGS  Left Ventricle: Left ventricular ejection fraction, by estimation, is 60 to 65%. The left ventricle has normal function. The left ventricle has no regional wall motion abnormalities. The left ventricular internal cavity size was normal in size. There is  mild left ventricular hypertrophy. Left ventricular diastolic parameters were normal. Right Ventricle: The right ventricular size is normal. No increase in right ventricular wall thickness. Right ventricular systolic function is normal. Left Atrium: Left atrial size was moderately dilated. Right Atrium: Right atrial size was moderately dilated. Pericardium: Trivial pericardial effusion is present. The pericardial effusion is lateral to the left ventricle. Mitral Valve: The mitral valve is degenerative in appearance. There is mild thickening of the mitral valve leaflet(s). There is mild calcification of the mitral valve leaflet(s). Moderate mitral annular calcification. No evidence of mitral valve stenosis. Tricuspid Valve: The tricuspid valve is normal in structure. Tricuspid valve regurgitation is not demonstrated. No evidence of tricuspid stenosis. Aortic Valve: Post 29 mm Medtronic supra annular Core valve stable gradients since 01/19/21 trivial AR likely PVL. The aortic valve has been repaired/replaced. Aortic valve regurgitation is trivial. No aortic stenosis is present. Aortic valve mean gradient  measures 15.0 mmHg. Aortic valve peak gradient measures 25.2 mmHg. Aortic valve area, by VTI measures 1.36 cm. Pulmonic Valve: The pulmonic valve was normal in structure. Pulmonic valve regurgitation is not visualized. No evidence of pulmonic stenosis. Aorta: The aortic root is normal in size and structure. Venous: The inferior vena cava is normal in size with greater than 50% respiratory variability, suggesting right atrial pressure of 3 mmHg. IAS/Shunts: No atrial level shunt detected by color flow  Doppler. LEFT VENTRICLE PLAX 2D LVIDd:         4.30 cm LVIDs:         2.30 cm LV PW:         1.50 cm LV IVS:        1.40 cm LVOT diam:     1.90 cm LV SV:         88 LV SV Index:   36 LVOT Area:     2.84 cm  LV Volumes (MOD) LV vol d, MOD A2C: 89.6 ml LV vol d, MOD A4C: 84.9 ml LV vol s, MOD A2C: 41.1 ml LV vol s, MOD A4C: 35.4 ml LV SV MOD A2C:     48.5 ml LV SV MOD A4C:     84.9 ml LV SV MOD BP:      51.3 ml LEFT ATRIUM         Index LA diam:    5.40 cm 2.25 cm/m  AORTIC VALVE AV Area (Vmax):    1.39 cm AV Area (Vmean):   1.26 cm AV Area (VTI):     1.36 cm AV Vmax:           251.00 cm/s AV Vmean:          182.000 cm/s AV VTI:            0.644 m AV Peak Grad:      25.2 mmHg AV Mean Grad:      15.0 mmHg LVOT Vmax:         123.00 cm/s LVOT Vmean:        80.800 cm/s LVOT VTI:          0.309 m LVOT/AV VTI ratio: 0.48  SHUNTS Systemic VTI:  0.31 m Systemic Diam: 1.90 cm Charlton Haws MD Electronically signed by Charlton Haws MD Signature Date/Time: 09/27/2021/4:34:21 PM  Final     Cardiac Studies   Echo 09/27/21:  1. Left ventricular ejection fraction, by estimation, is 60 to 65%. The  left ventricle has normal function. The left ventricle has no regional  wall motion abnormalities. There is mild left ventricular hypertrophy.  Left ventricular diastolic parameters  were normal.   2. Right ventricular systolic function is normal. The right ventricular  size is normal.   3. Left atrial size was moderately dilated.   4. Right atrial size was moderately dilated.   5. The pericardial effusion is lateral to the left ventricle.   6. The mitral valve is degenerative. No evidence of mitral valve  regurgitation. No evidence of mitral stenosis. Moderate mitral annular  calcification.   7. Post 29 mm Medtronic supra annular Core valve stable gradients since  01/19/21 trivial AR likely PVL . The aortic valve has been  repaired/replaced. Aortic valve regurgitation is trivial. No aortic  stenosis is present.    8. The inferior vena cava is normal in size with greater than 50%  respiratory variability, suggesting right atrial pressure of 3 mmHg.   Patient Profile     65 y.o. male  with a hx of HTN, HFpEF, severe AS s/p TAVR 08/2020, pAF/AFL, DM, CKDIII, R TKA, OSA on CPAP who is being seen 09/26/2021 for the evaluation of decompensated HF  Assessment & Plan    Acute on Chronic Diastolic Heart Failure: Likely due to stopping Lasix as outpatient.  Chest x-ray consistent with progressive CHF.  BNP 567.  Resolved with IV diuresis but developed worsening renal function (creatinine 1.6 > 2.0 > 2.6>2.9 >2.4).   -Appears euvolemic, renal function improving with holding diuretic, would continue to hold today -He is having chills this morning and new O2 requirement.  Afebrile. Question whether pneumonia developing, will check procalcitonin, CXR   AKI on CKD: Creatinine 1.6 on admission, which appears baseline, has increased to 2.9, down to 2.35 today with holding diuresis.  Continue to hold diuretics.  If continue to have significant autodiuresis may need gentle IV fluids   Paroxysmal atrial fibrillation: Currently in sinus rhythm -Continue metoprolol, amiodarone, Eliquis.  Hold diltiazem given soft pressures   Severe aortic stenosis status post TAVR 08/2020: Echo showed normal functioning bioprosthetic valve   Hypertension: BP has been soft.  Holding home lisinopril.  Will also hold diltiazem and hydralazine.  Can continue metoprolol.     For questions or updates, please contact [place] Please consult [email or web address] for contact info under        Signed, Donato Heinz, MD  09/29/2021, 9:56 AM

## 2021-09-29 NOTE — Progress Notes (Signed)
Cooling blanket applied.

## 2021-09-29 NOTE — Progress Notes (Signed)
ABG drawn and sent to lab. Lab notified, RT will continue to monitor.  

## 2021-09-29 NOTE — Progress Notes (Signed)
Advised that temp beginning to increase again.

## 2021-09-29 NOTE — Progress Notes (Signed)
Paged Dr. Renford Dills and advised that pt is very lethargic, pt is twitching as well, looks like patients calcium is low as well. Advised to continue to monitor patient. Pt has a temp as well. Rapid was contacted and ICE has been applied to patient.

## 2021-09-29 NOTE — Plan of Care (Signed)
°  Problem: Education: Goal: Knowledge of General Education information will improve Description: Including pain rating scale, medication(s)/side effects and non-pharmacologic comfort measures Outcome: Progressing   Problem: Health Behavior/Discharge Planning: Goal: Ability to manage health-related needs will improve Outcome: Progressing   Problem: Clinical Measurements: Goal: Ability to maintain clinical measurements within normal limits will improve Outcome: Progressing   Problem: Clinical Measurements: Goal: Respiratory complications will improve Outcome: Progressing   Problem: Clinical Measurements: Goal: Cardiovascular complication will be avoided Outcome: Progressing   Problem: Nutrition: Goal: Adequate nutrition will be maintained Outcome: Progressing   Problem: Elimination: Goal: Will not experience complications related to bowel motility Outcome: Progressing   Problem: Pain Managment: Goal: General experience of comfort will improve Outcome: Progressing   Problem: Safety: Goal: Ability to remain free from injury will improve Outcome: Progressing   Problem: Skin Integrity: Goal: Risk for impaired skin integrity will decrease Outcome: Progressing   Problem: Cardiac: Goal: Ability to achieve and maintain adequate cardiopulmonary perfusion will improve Outcome: Progressing

## 2021-09-29 NOTE — Progress Notes (Signed)
rectal temp 104.8, Tylenol suppository given, bolus and fluids in process, ice still applied to patient

## 2021-09-29 NOTE — Progress Notes (Addendum)
PROGRESS NOTE    Lawrence Rose  [id number] DOB: 10-02-1956 DOA: 09/24/2021 PCP: Doreatha Lew, MD   Chief Complain: Exertional dyspnea  Brief Narrative:  Patient is a 65 year old male with history of heart failure with preserved ejection fraction, severe aortic stenosis status post-TAVR in 2021, paroxysmal A. fib status post DCCV, hypertension, CKD stage IIIa, diabetes type 2, OSA, GI bleed who presented to the emergency department with complaints of exertional dyspnea, orthopnea.  On presentation he was hypoxic with elevated BNP.  Chest x-ray showed cardiomegaly, bibasilar opacities, interstitial prominence, bilateral pleural effusion.  He was admitted for the management of acute on chronic diastolic heart failure exacerbation.  Started on IV Lasix, cardiology consulted.  Hospital course remarkable for worsening kidney function most likely from overdiuresis, diuresis on hold.  Assessment & Plan:   Principal Problem:   CHF exacerbation ([place]) Active Problems:   OSA on CPAP   Severe aortic stenosis   AKI (acute kidney injury) ([place])   Acute on chronic diastolic (congestive) heart failure (HCC)   Normocytic anemia   S/P TAVR (transcatheter aortic valve replacement)   Chronic anticoagulation   Diabetes mellitus type 2 in obese (HCC)   PAF (paroxysmal atrial fibrillation) (HCC)   Respiratory failure with hypoxia (HCC)   Chest pain   Chronic pain syndrome   CHF (congestive heart failure) (HCC)   Acute on chronic hypoxic respiratory failure secondary to diastolic congestive heart failure exacerbation: Presented with dyspnea, orthopnea.  Found to be volume overloaded on presentation.  Elevated BNP, chest x-ray showed features of congestion.  Started on IV Lasix. Currently respiratory status has been stable.  Patient is on 2 L nocturnal oxygen at baseline.  We will check if he needs oxygen on discharge for daytime.  Acute on chronic diastolic congestive heart failure exacerbation:  Presented with above features.  Started on IV Lasix, on hold now because of worsening kidney function.  Currently patient is auto diuresing.  Had significant urine output since last 2 days.  Currently appears euvolemic.  AKI on CKD stage IIIa: Kidney function of from baseline but slowly improving after Lasix has been held.  Continue monitoring  Hypertension: On metoprolol at home.  Lisinopril hold due to AKI.  Cardizem, hydralazine on hold as well  Paroxysmal A. fib: On metoprolol for rate control, also on amiodarone.  On Eliquis for anticoagulation.  Status post DCCV.  Currently in normal sinus rhythm  Normocytic anemia: Currently hemoglobin stable, no evidence of bleeding.  Continue to monitor.  Coronary artery disease: Cardiac cath on November 2021 showed widely patent coronaries.  Troponins negative.  On Plavix, beta-blocker, statin at home  OSA: CPAP with 2 L of oxygen at night  Chronic pain syndrome: On chronic pain medications  Episodes of shaking, chills: Chest x-ray/procalcitonin pending . Patient is afebrile.  Continue [employer]. Lab work showed elevated leukocytes which is a new finding.  Will give a dose of vancomycin and cefepime.  Blood cultures will be sent.  Morbid obesity: BMI 37.7         DVT prophylaxis:Eliquis Code Status: Full Family Communication: None at bedside Patient status:Inpatient  Dispo: The patient is from: Home              Anticipated d/c is to: Home              Anticipated d/c date is: in next 2-3 days  Consultants: Cardiology  Procedures:None  Antimicrobials:  Anti-infectives (From admission, onward)    None  Subjective: Patient seen and examined at the bedside this morning.  Hemodynamically stable.  He was having episodes of shaking/chills, on bear hugger.  Denies any shortness of breath or cough.  On 2 L of oxygen per minute  Objective: Vitals:   09/29/21 0352 09/29/21 0947 09/29/21 1005 09/29/21 1042  BP:   (!) 154/76    Pulse:   86   Resp:      Temp:  98.2 F (36.8 C)    TempSrc:  Oral    SpO2:    94%  Weight: 122.6 kg     Height:        Intake/Output Summary (Last 24 hours) at 09/29/2021 1045 Last data filed at 09/29/2021 0946 Gross per 24 hour  Intake 240 ml  Output 4875 ml  Net -4635 ml   Filed Weights   09/27/21 0431 09/28/21 0500 09/29/21 0352  Weight: 123.1 kg 127.1 kg 122.6 kg    Examination:  General exam: Was shaking/tremoring, morbidly obese HEENT: PERRL Respiratory system: diminised air sounds bilaterally   Cardiovascular system: S1 & S2 heard, RRR.  Gastrointestinal system: Abdomen is nondistended, soft and nontender. Central nervous system: Alert and oriented Extremities: No edema, no clubbing ,no cyanosis Skin: No rashes, no ulcers,no icterus      Data Reviewed: I have personally reviewed following labs and imaging studies  CBC: Recent Labs  Lab 09/24/21 1712 09/25/21 0318  WBC 7.4  --   HGB 9.8* 12.6*  HCT 32.5* 37.0*  MCV 88.6  --   PLT 222  --    Basic Metabolic Panel: Recent Labs  Lab 09/24/21 1712 09/25/21 0318 09/26/21 0301 09/27/21 0139 09/28/21 0320 09/29/21 0251  NA 136 138 136 136 135 136  K 4.2 4.2 3.7 4.4 4.6 4.1  CL 99  --  96* 94* 93* 96*  CO2 28  --  32 32 29 34*  GLUCOSE 117*  --  107* 121* 122* 112*  BUN 21  --  26* 39* 43* 42*  CREATININE 1.63*  --  1.95* 2.58* 2.91* 2.35*  CALCIUM 9.4  --  8.7* 8.5* 8.8* 8.7*  MG  --   --  2.1 2.2  --   --   PHOS  --   --  4.5  --  4.4 4.0   GFR: Estimated Creatinine Clearance: 40.6 mL/min (A) (by C-G formula based on SCr of 2.35 mg/dL (H)). Liver Function Tests: Recent Labs  Lab 09/28/21 0320 09/29/21 0251  ALBUMIN 2.9* 2.8*   No results for input(s): LIPASE, AMYLASE in the last 168 hours. No results for input(s): AMMONIA in the last 168 hours. Coagulation Profile: No results for input(s): INR, PROTIME in the last 168 hours. Cardiac Enzymes: No results for input(s): CKTOTAL, CKMB,  CKMBINDEX, TROPONINI in the last 168 hours. BNP (last 3 results) No results for input(s): PROBNP in the last 8760 hours. HbA1C: No results for input(s): HGBA1C in the last 72 hours. CBG: Recent Labs  Lab 09/25/21 1613 09/26/21 0626 09/27/21 0604 09/28/21 0535 09/29/21 0355  GLUCAP 130* 115* 114* 145* 134*   Lipid Profile: No results for input(s): CHOL, HDL, LDLCALC, TRIG, CHOLHDL, LDLDIRECT in the last 72 hours. Thyroid Function Tests: No results for input(s): TSH, T4TOTAL, FREET4, T3FREE, THYROIDAB in the last 72 hours. Anemia Panel: No results for input(s): VITAMINB12, FOLATE, FERRITIN, TIBC, IRON, RETICCTPCT in the last 72 hours. Sepsis Labs: No results for input(s): PROCALCITON, LATICACIDVEN in the last 168 hours.  Recent Results (from the past 240  hour(s))  Resp Panel by RT-PCR (Flu A&B, Covid) Nasopharyngeal Swab     Status: None   Collection Time: 09/25/21  2:51 AM   Specimen: Nasopharyngeal Swab; Nasopharyngeal(NP) swabs in vial transport medium  Result Value Ref Range Status   SARS Coronavirus 2 by RT PCR NEGATIVE NEGATIVE Final    Comment: (NOTE) SARS-CoV-2 target nucleic acids are NOT DETECTED.  The SARS-CoV-2 RNA is generally detectable in upper respiratory specimens during the acute phase of infection. The lowest concentration of SARS-CoV-2 viral copies this assay can detect is 138 copies/mL. A negative result does not preclude SARS-Cov-2 infection and should not be used as the sole basis for treatment or other patient management decisions. A negative result may occur with  improper specimen collection/handling, submission of specimen other than nasopharyngeal swab, presence of viral mutation(s) within the areas targeted by this assay, and inadequate number of viral copies(<138 copies/mL). A negative result must be combined with clinical observations, patient history, and epidemiological information. The expected result is Negative.  Fact Sheet for Patients:   BloggerCourse.com  Fact Sheet for Healthcare Providers:  SeriousBroker.it  This test is no t yet approved or cleared by the [place] FDA and  has been authorized for detection and/or diagnosis of SARS-CoV-2 by FDA under an Emergency Use Authorization (EUA). This EUA will remain  in effect (meaning this test can be used) for the duration of the COVID-19 declaration under Section 564(b)(1) of the Act, 21 U.S.C.section 360bbb-3(b)(1), unless the authorization is terminated  or revoked sooner.       Influenza A by PCR NEGATIVE NEGATIVE Final   Influenza B by PCR NEGATIVE NEGATIVE Final    Comment: (NOTE) The Xpert Xpress SARS-CoV-2/FLU/RSV plus assay is intended as an aid in the diagnosis of influenza from Nasopharyngeal swab specimens and should not be used as a sole basis for treatment. Nasal washings and aspirates are unacceptable for Xpert Xpress SARS-CoV-2/FLU/RSV testing.  Fact Sheet for Patients: BloggerCourse.com  Fact Sheet for Healthcare Providers: SeriousBroker.it  This test is not yet approved or cleared by the [place] FDA and has been authorized for detection and/or diagnosis of SARS-CoV-2 by FDA under an Emergency Use Authorization (EUA). This EUA will remain in effect (meaning this test can be used) for the duration of the COVID-19 declaration under Section 564(b)(1) of the Act, 21 U.S.C. section 360bbb-3(b)(1), unless the authorization is terminated or revoked.  Performed at [facility], [address] [place], [place] [address]          Radiology Studies: US RENAL  Result Date: 09/28/2021 CLINICAL DATA:  Acute renal insufficiency EXAM: RENAL / URINARY TRACT ULTRASOUND COMPLETE COMPARISON:  10/28/2020 abdominal MRI. FINDINGS: Right Kidney: Renal measurements: 10.5 x 6.0 x 5.0 cm = volume: 167 mL. No hydronephrosis. Normal renal  echogenicity. Interpolar right renal 1.0 cm cyst. Left Kidney: Renal measurements: 11.7 x 5.9 x 6.1 cm = volume: 217 mL. Normal renal echogenicity. No hydronephrosis. Bladder: Appears normal for degree of bladder distention. Other: Incidental note is made of soft tissue echogenicity within the gallbladder lumen at 2.6 cm. New since MRI of 10/28/2020. No gallbladder wall thickening or pericholecystic fluid. IMPRESSION: 1.  No acute process or explanation for renal insufficiency. 2. Soft tissue echogenicity within the gallbladder is favored to represent tumefactive sludge, given development since January. Recommend dedicated right upper quadrant ultrasound follow-up at 3 months. Electronically Signed   By: Jeronimo Greaves M.D.   On: 09/28/2021 16:58   ECHOCARDIOGRAM LIMITED  Result Date: 09/27/2021    ECHOCARDIOGRAM LIMITED REPORT   Patient Name:   ARIEON PHILIP Date of Exam: 09/27/2021 Medical Rec #:  [id number]      Height:       71.0 in Accession #:    [id number]     Weight:       271.4 lb Date of Birth:  1956/01/02      BSA:          2.401 m Patient Age:    62 years       BP:           108/53 mmHg Patient Gender: M              HR:           62 bpm. Exam Location:  Inpatient Procedure: Limited Echo and Limited Color Doppler Indications:    CHF  History:        Patient has prior history of Echocardiogram examinations, most                 recent 08/26/2021. CHF, Arrythmias:Atrial Flutter,                 Signs/Symptoms:Chest Pain; Risk Factors:Hypertension and                 Diabetes. 46mm Medtronic Evolit Aortic valve.  Sonographer:    Wenda Low Referring Phys: [id number] [place]  1. Left ventricular ejection fraction, by estimation, is 60 to 65%. The left ventricle has normal function. The left ventricle has no regional wall motion abnormalities. There is mild left ventricular hypertrophy. Left ventricular diastolic parameters were normal.  2. Right ventricular systolic function  is normal. The right ventricular size is normal.  3. Left atrial size was moderately dilated.  4. Right atrial size was moderately dilated.  5. The pericardial effusion is lateral to the left ventricle.  6. The mitral valve is degenerative. No evidence of mitral valve regurgitation. No evidence of mitral stenosis. Moderate mitral annular calcification.  7. Post 29 mm Medtronic supra annular Core valve stable gradients since 01/19/21 trivial AR likely PVL . The aortic valve has been repaired/replaced. Aortic valve regurgitation is trivial. No aortic stenosis is present.  8. The inferior vena cava is normal in size with greater than 50% respiratory variability, suggesting right atrial pressure of 3 mmHg. FINDINGS  Left Ventricle: Left ventricular ejection fraction, by estimation, is 60 to 65%. The left ventricle has normal function. The left ventricle has no regional wall motion abnormalities. The left ventricular internal cavity size was normal in size. There is  mild left ventricular hypertrophy. Left ventricular diastolic parameters were normal. Right Ventricle: The right ventricular size is normal. No increase in right ventricular wall thickness. Right ventricular systolic function is normal. Left Atrium: Left atrial size was moderately dilated. Right Atrium: Right atrial size was moderately dilated. Pericardium: Trivial pericardial effusion is present. The pericardial effusion is lateral to the left ventricle. Mitral Valve: The mitral valve is degenerative in appearance. There is mild thickening of the mitral valve leaflet(s). There is mild calcification of the mitral valve leaflet(s). Moderate mitral annular calcification. No evidence of mitral valve stenosis. Tricuspid Valve: The tricuspid valve is normal in structure. Tricuspid valve regurgitation is not demonstrated. No evidence of tricuspid stenosis. Aortic Valve: Post 29 mm Medtronic supra annular Core valve stable gradients since 01/19/21 trivial AR likely  PVL. The aortic valve has been repaired/replaced. Aortic valve regurgitation is trivial. No  aortic stenosis is present. Aortic valve mean gradient  measures 15.0 mmHg. Aortic valve peak gradient measures 25.2 mmHg. Aortic valve area, by VTI measures 1.36 cm. Pulmonic Valve: The pulmonic valve was normal in structure. Pulmonic valve regurgitation is not visualized. No evidence of pulmonic stenosis. Aorta: The aortic root is normal in size and structure. Venous: The inferior vena cava is normal in size with greater than 50% respiratory variability, suggesting right atrial pressure of 3 mmHg. IAS/Shunts: No atrial level shunt detected by color flow Doppler. LEFT VENTRICLE PLAX 2D LVIDd:         4.30 cm LVIDs:         2.30 cm LV PW:         1.50 cm LV IVS:        1.40 cm LVOT diam:     1.90 cm LV SV:         88 LV SV Index:   36 LVOT Area:     2.84 cm  LV Volumes (MOD) LV vol d, MOD A2C: 89.6 ml LV vol d, MOD A4C: 84.9 ml LV vol s, MOD A2C: 41.1 ml LV vol s, MOD A4C: 35.4 ml LV SV MOD A2C:     48.5 ml LV SV MOD A4C:     84.9 ml LV SV MOD BP:      51.3 ml LEFT ATRIUM         Index LA diam:    5.40 cm 2.25 cm/m  AORTIC VALVE AV Area (Vmax):    1.39 cm AV Area (Vmean):   1.26 cm AV Area (VTI):     1.36 cm AV Vmax:           251.00 cm/s AV Vmean:          182.000 cm/s AV VTI:            0.644 m AV Peak Grad:      25.2 mmHg AV Mean Grad:      15.0 mmHg LVOT Vmax:         123.00 cm/s LVOT Vmean:        80.800 cm/s LVOT VTI:          0.309 m LVOT/AV VTI ratio: 0.48  SHUNTS Systemic VTI:  0.31 m Systemic Diam: 1.90 cm Jenkins Rouge MD Electronically signed by Jenkins Rouge MD Signature Date/Time: 09/27/2021/4:34:21 PM    Final         Scheduled Meds:  amiodarone  200 mg Oral Daily   apixaban  5 mg Oral BID   clopidogrel  75 mg Oral q morning   gabapentin  600 mg Oral BID   latanoprost  1 drop Both Eyes QHS   metoprolol tartrate  50 mg Oral BID   oxyCODONE  10 mg Oral Q12H   pantoprazole  40 mg Oral BID    polyethylene glycol  17 g Oral Daily   rosuvastatin  20 mg Oral Daily   senna-docusate  1 tablet Oral Daily   sodium chloride flush  3 mL Intravenous Q12H   Continuous Infusions:   LOS: 4 days    Time spent: 35 mins.More than 50% of that time was spent in counseling and/or coordination of care.      Shelly Coss, MD [facility] P12/14/2022, 10:45 AM

## 2021-09-29 NOTE — Progress Notes (Signed)
Pharmacy Antibiotic Note  Lawrence Rose is a 65 y.o. male admitted on 09/24/2021 with  shortness of breath .  Pharmacy has been consulted for vancomycin and cefepime dosing.  Patient now febrile to 104, wbc elevated at 13.8. Renal function impaired but slightly improved from yesterday to 2.3. No antibiotics received this admit. Starting broad antibiotics with vancomycin and cefepime for rule out sepsis.   Plan: Vancomycin 2g x1 then 1500mg  IV every 24 hours.  Goal trough 15-20 mcg/mL. Cefepime 2g IV q 12 hours Will use traditional dosing for vancomycin given fluctuating renal function.   Height: 5\' 11"  (180.3 cm) Weight: 122.6 kg (270 lb 4.8 oz) IBW/kg (Calculated) : 75.3  Temp (24hrs), Avg:101.2 F (38.4 C), Min:97.9 F (36.6 C), Max:104.8 F (40.4 C)  Recent Labs  Lab 09/24/21 1712 09/26/21 0301 09/27/21 0139 09/28/21 0320 09/29/21 0251 09/29/21 1050 09/29/21 1314  WBC 7.4  --   --   --   --  13.8*  --   CREATININE 1.63* 1.95* 2.58* 2.91* 2.35*  --   --   LATICACIDVEN  --   --   --   --   --   --  1.6    Estimated Creatinine Clearance: 40.6 mL/min (A) (by C-G formula based on SCr of 2.35 mg/dL (H)).    Allergies  Allergen Reactions   Cocoa Hives   Sulfamethoxazole-Trimethoprim Anaphylaxis   Chocolate Hives   Citrus Hives    Reaction to oranges and orange juice   Fish Allergy Hives and Swelling    Throat swelling   Shellfish Allergy Hives and Swelling    Throat swelling   Sulfa Antibiotics Hives and Swelling    Throat swelling   Tetracycline Swelling    Antimicrobials this admission: Vancomycin 12/14> Cefepime 12/14>   Microbiology results: 12/14 07-23-1983   Thank you for allowing pharmacy to be a part of this patients care.  1/15 PharmD., BCPS Clinical Pharmacist 09/29/2021 2:05 PM

## 2021-09-29 NOTE — Progress Notes (Signed)
Ice applied to patient with cooling blanket to try improve temperature. Pt is alert and oriented. BiPap still in place. Spoke with Dr. Renford Dills and requested ibuprofen for fever since it was increasing again. Pt denying any other symptoms.

## 2021-09-29 NOTE — Progress Notes (Signed)
Mobility Specialist Progress Note    09/29/21 1234  Mobility  Activity Contraindicated/medical hold   RN advised to hold off. Will f/u when schedule permits.   [facility] Mobility Specialist  M.S. Primary Phone: [phone] M.S. Secondary Phone: [phone]

## 2021-09-29 NOTE — Progress Notes (Signed)
Pt placed on Bipap by RT. Pt tolerating well, RN aware, MD aware, RT will continue to monitor.

## 2021-09-29 NOTE — Significant Event (Signed)
Rapid Response Event Note   Reason for Call :  Decreased LOC Twitching  Pt was shivering earlier and afebrile. Warming blanket was placed for patient comfort.   Initial Focused Assessment:  Pt lying in bed, drowsy. Eye opening to speech and he is oriented x4. Lung sounds are rhonchus throughout. Skin is hot, dry, pink. Pt is found to be febrile at 103.74F orally. BP is soft, although peripherally pulses are 2+. Pt moving all extremities equally. Continues to have twitch movements. Ca 8.7 this AM. CO2 increased from 29 to 34 today on AM labs.   VS: T 103.74F, BP 71/39, HR 76, RR 29, SpO2 94% on CPAP/3L CBG: 150  Interventions:  -Warming blanket removed, ice packs applied. Room cooled. Excess linen removed from bed. Fan at [employer].  -CPAP applied 3L [place] bled in -LA ordered by MD  Plan of Care:  -If fever persists, PRN Tylenol is available -Monitor for resolution of hypotension with fever reduction -Consider ABG if pt remains lethargic  Call rapid response for additional needs  Event Summary:  MD Notified: Dr. Renford Dills by primary RN Call Time: 1201 Arrival Time: 1201 End Time: 1240  Jennye Moccasin, RN

## 2021-09-30 DIAGNOSIS — I5033 Acute on chronic diastolic (congestive) heart failure: Secondary | ICD-10-CM

## 2021-09-30 DIAGNOSIS — J9601 Acute respiratory failure with hypoxia: Secondary | ICD-10-CM

## 2021-09-30 DIAGNOSIS — A419 Sepsis, unspecified organism: Secondary | ICD-10-CM

## 2021-09-30 DIAGNOSIS — I509 Heart failure, unspecified: Secondary | ICD-10-CM

## 2021-09-30 DIAGNOSIS — N179 Acute kidney failure, unspecified: Secondary | ICD-10-CM

## 2021-09-30 LAB — CBC WITH DIFFERENTIAL/PLATELET
Abs Immature Granulocytes: 0.9 10*3/uL — ABNORMAL HIGH (ref 0.00–0.07)
Basophils Absolute: 0 10*3/uL (ref 0.0–0.1)
Basophils Relative: 0 %
Eosinophils Absolute: 0 10*3/uL (ref 0.0–0.5)
Eosinophils Relative: 0 %
HCT: 34.1 % — ABNORMAL LOW (ref 39.0–52.0)
Hemoglobin: 10 g/dL — ABNORMAL LOW (ref 13.0–17.0)
Immature Granulocytes: 4 %
Lymphocytes Relative: 4 %
Lymphs Abs: 0.9 10*3/uL (ref 0.7–4.0)
MCH: 26.5 pg (ref 26.0–34.0)
MCHC: 29.3 g/dL — ABNORMAL LOW (ref 30.0–36.0)
MCV: 90.5 fL (ref 80.0–100.0)
Monocytes Absolute: 3 10*3/uL — ABNORMAL HIGH (ref 0.1–1.0)
Monocytes Relative: 12 %
Neutro Abs: 20.7 10*3/uL — ABNORMAL HIGH (ref 1.7–7.7)
Neutrophils Relative %: 80 %
Platelets: 216 10*3/uL (ref 150–400)
RBC: 3.77 MIL/uL — ABNORMAL LOW (ref 4.22–5.81)
RDW: 15.3 % (ref 11.5–15.5)
WBC: 25.6 10*3/uL — ABNORMAL HIGH (ref 4.0–10.5)
nRBC: 0 % (ref 0.0–0.2)

## 2021-09-30 LAB — RESPIRATORY PANEL BY PCR

## 2021-09-30 LAB — HEPATIC FUNCTION PANEL
ALT: 21 U/L (ref 0–44)
AST: 30 U/L (ref 15–41)
Albumin: 2.8 g/dL — ABNORMAL LOW (ref 3.5–5.0)
Alkaline Phosphatase: 64 U/L (ref 38–126)
Bilirubin, Direct: 0.2 mg/dL (ref 0.0–0.2)
Indirect Bilirubin: 0.8 mg/dL (ref 0.3–0.9)
Total Bilirubin: 1 mg/dL (ref 0.3–1.2)
Total Protein: 7.1 g/dL (ref 6.5–8.1)

## 2021-09-30 LAB — BASIC METABOLIC PANEL
Anion gap: 17 — ABNORMAL HIGH (ref 5–15)
BUN: 42 mg/dL — ABNORMAL HIGH (ref 8–23)
CO2: 25 mmol/L (ref 22–32)
Calcium: 8.8 mg/dL — ABNORMAL LOW (ref 8.9–10.3)
Chloride: 99 mmol/L (ref 98–111)
Creatinine, Ser: 2.4 mg/dL — ABNORMAL HIGH (ref 0.61–1.24)
GFR, Estimated: 29 mL/min — ABNORMAL LOW (ref >60)
Glucose, Bld: 125 mg/dL — ABNORMAL HIGH (ref 70–99)
Potassium: 4.1 mmol/L (ref 3.5–5.1)
Sodium: 141 mmol/L (ref 135–145)

## 2021-09-30 LAB — GLUCOSE, CAPILLARY
Glucose-Capillary: 129 mg/dL — ABNORMAL HIGH (ref 70–99)
Glucose-Capillary: 117 mg/dL — ABNORMAL HIGH (ref 70–99)
Glucose-Capillary: 124 mg/dL — ABNORMAL HIGH (ref 70–99)
Glucose-Capillary: 152 mg/dL — ABNORMAL HIGH (ref 70–99)
Glucose-Capillary: 120 mg/dL — ABNORMAL HIGH (ref 70–99)

## 2021-09-30 LAB — MAGNESIUM: Magnesium: 2 mg/dL (ref 1.7–2.4)

## 2021-09-30 MED ORDER — CHLORHEXIDINE GLUCONATE CLOTH 2 % EX PADS
6.0000 | MEDICATED_PAD | Freq: Every day | CUTANEOUS | Status: DC
  Administered 2021-09-30 – 2021-10-01 (×2): 6 via TOPICAL

## 2021-09-30 MED ORDER — LACTATED RINGERS IV BOLUS: 1000.0000 mL | Freq: Once | INTRAVENOUS | Status: DC

## 2021-09-30 MED ORDER — LACTATED RINGERS IV BOLUS
1000.0000 mL | Freq: Once | INTRAVENOUS | Status: AC
  Administered 2021-09-30: 1000 mL via INTRAVENOUS

## 2021-09-30 MED ORDER — HYDROCORTISONE SOD SUC (PF) 100 MG IJ SOLR: 100.0000 mg | Freq: Three times a day (TID) | INTRAMUSCULAR | Status: DC

## 2021-09-30 MED ORDER — MIDODRINE HCL 5 MG PO TABS
5.0000 mg | ORAL_TABLET | Freq: Three times a day (TID) | ORAL | Status: DC
  Filled 2021-09-30: qty 1

## 2021-09-30 NOTE — Progress Notes (Signed)
PROGRESS NOTE    Lawrence Rose  [id number] DOB: 09/23/1956 DOA: 09/24/2021 PCP: Doreatha Lew, MD   Chief Complain: Exertional dyspnea  Brief Narrative:  Patient is a 65 year old male with history of heart failure with preserved ejection fraction, severe aortic stenosis status post-TAVR in 2021, paroxysmal A. fib status post DCCV, hypertension, CKD stage IIIa, diabetes type 2, OSA, GI bleed who presented to the emergency department with complaints of exertional dyspnea, orthopnea.  On presentation he was hypoxic with elevated BNP.  Chest x-ray showed cardiomegaly, bibasilar opacities, interstitial prominence, bilateral pleural effusion.  He was admitted for the management of acute on chronic diastolic heart failure exacerbation.  Started on IV Lasix, cardiology consulted.  Hospital course remarkable for worsening kidney function most likely from overdiuresis, diuresis on hold.  Hospital course also remarkable for new onset of fever, hypertension, leukocytosis.  Sepsis suspected.  Currently on broad-spectrum antibiotics. Started  on hydrocortisone and midodrine.  PCCM consulted today and he has been moved to ICU  Assessment & Plan:   Principal Problem:   CHF exacerbation ([place]) Active Problems:   OSA on CPAP   Severe aortic stenosis   AKI (acute kidney injury) ([place])   Acute on chronic diastolic (congestive) heart failure (HCC)   Normocytic anemia   S/P TAVR (transcatheter aortic valve replacement)   Chronic anticoagulation   Diabetes mellitus type 2 in obese (HCC)   PAF (paroxysmal atrial fibrillation) (HCC)   Respiratory failure with hypoxia (HCC)   Chest pain   Chronic pain syndrome   CHF (congestive heart failure) ([place])   Sepsis: Had new onset of high-grade fever, leukocytosis, hypotension from 09/29/2021.  Elevated procalcitonin.  Fever has subsided but his blood pressure remains soft.  Cultures will be followed.  UA showed significant leukocytes.  Started on vancomycin  and cefepime which we will continue.  We will also do abdominal/pelvis CT without contrast to look for other source of sepsis.  Chest x-ray did not show any clear pneumonia. Also started on hydrocortisone and midodrine.  PCCM consulted,moved to ICU  Acute on chronic hypoxic respiratory failure secondary to diastolic congestive heart failure exacerbation: Presented with dyspnea, orthopnea.  Found to be volume overloaded on presentation.  Elevated BNP, chest x-ray showed features of congestion.  Started on IV Lasix,now on hold   Patient is on 2 L nocturnal oxygen at baseline.  ABG done on 12/14 showed hypercarbia, had to put on BiPAP. PCCM taking over  Acute on chronic diastolic congestive heart failure exacerbation: Presented with above features.  Started on IV Lasix, on hold now because of worsening kidney function.  Currently patient is auto diuresing.  Had significant urine output since last 2 days.  Currently appears euvolemic.  Now on fluids for hypotension  AKI on CKD stage IIIa: Kidney function of from baseline but creatinine hanging out at 2.4.  Monitor  Hypertension: On metoprolol at home,now on hold.  Lisinopril hold due to AKI.  Cardizem, hydralazine on hold as well  Paroxysmal A. fib: On metoprolol for rate control, also on amiodarone.  On Eliquis for anticoagulation.  Status post DCCV.  Currently in normal sinus rhythm  Normocytic anemia: Currently hemoglobin stable, no evidence of bleeding.  Continue to monitor.  Coronary artery disease: Cardiac cath on November 2021 showed widely patent coronaries.  Troponins negative.  On Plavix, beta-blocker, statin at home  OSA: CPAP with 2 L of oxygen at night  Chronic pain syndrome: On chronic pain medications  Morbid obesity: BMI 37.7  DVT prophylaxis:Eliquis Code Status: Full Family Communication: Brother at bedside on 12/14 Patient status:Inpatient  Dispo: The patient is from: Home              Anticipated d/c is to:  Home              Anticipated d/c date is: Not sure at this moment  Consultants: Cardiology,PCCM  Procedures:None  Antimicrobials:  Anti-infectives (From admission, onward)    Start     Dose/Rate Route Frequency Ordered Stop   09/30/21 1800  vancomycin (VANCOREADY) IVPB 1500 mg/300 mL        1,500 mg 150 mL/hr over 120 Minutes Intravenous Every 24 hours 09/29/21 1409     09/29/21 1500  ceFEPIme (MAXIPIME) 2 g in sodium chloride 0.9 % 100 mL IVPB        2 g 200 mL/hr over 30 Minutes Intravenous Every 12 hours 09/29/21 1406     09/29/21 1215  vancomycin (VANCOREADY) IVPB 2000 mg/400 mL        2,000 mg 200 mL/hr over 120 Minutes Intravenous  Once 09/29/21 1119 09/29/21 1439   09/29/21 1215  ceFEPIme (MAXIPIME) 2 g in sodium chloride 0.9 % 100 mL IVPB  Status:  Discontinued        2 g 200 mL/hr over 30 Minutes Intravenous  Once 09/29/21 1126 09/29/21 1518   09/29/21 1200  ceFEPIme (MAXIPIME) 2 g in sodium chloride 0.9 % 100 mL IVPB  Status:  Discontinued        2 g 200 mL/hr over 30 Minutes Intravenous Every 12 hours 09/29/21 1119 09/29/21 1126       Subjective: Patient seen and examined at the bedside this morning.  Blood pressure slightly improved today but systolic blood pressure still low in the range of 90s.  Mild grade fever this morning.  His mental status has improved and he is alert and oriented.  He still has episodes of shivering.  Objective: Vitals:   09/30/21 0500 09/30/21 0618 09/30/21 0635 09/30/21 0700  BP: (!) 95/48 (!) 86/53 (!) 101/51 (!) 92/53  Pulse: 75 73 75 74  Resp: 15 16 17 19   Temp: 98.3 F (36.8 C)  98.4 F (36.9 C) 99 F (37.2 C)  TempSrc: Rectal  Rectal Rectal  SpO2: 96% 98% 100% 100%  Weight: 125.4 kg     Height:        Intake/Output Summary (Last 24 hours) at 09/30/2021 0811 Last data filed at 09/30/2021 0600 Gross per 24 hour  Intake 1714.55 ml  Output 2925 ml  Net -1210.45 ml   Filed Weights   09/28/21 0500 09/29/21 0352 09/30/21  0500  Weight: 127.1 kg 122.6 kg 125.4 kg    Examination:  General exam: Not in distress, weak, shivering, morbidly obese HEENT: PERRL Respiratory system: Diminished air sounds bilaterally, no clear wheezing or crackles. Cardiovascular system: S1 & S2 heard, RRR.  Gastrointestinal system: Abdomen is nondistended, soft and nontender. Central nervous system: Alert and oriented Extremities: No edema, no clubbing ,no cyanosis Skin: No rashes, no ulcers,no icterus     Data Reviewed: I have personally reviewed following labs and imaging studies  CBC: Recent Labs  Lab 09/24/21 1712 09/25/21 0318 09/29/21 1050 09/30/21 0151  WBC 7.4  --  13.8* 25.6*  NEUTROABS  --   --   --  20.7*  HGB 9.8* 12.6* 11.3* 10.0*  HCT 32.5* 37.0* 36.5* 34.1*  MCV 88.6  --  87.7 90.5  PLT 222  --  264 216   Basic Metabolic Panel: Recent Labs  Lab 09/26/21 0301 09/27/21 0139 09/28/21 0320 09/29/21 0251 09/30/21 0151  NA 136 136 135 136 141  K 3.7 4.4 4.6 4.1 4.1  CL 96* 94* 93* 96* 99  CO2 32 32 29 34* 25  GLUCOSE 107* 121* 122* 112* 125*  BUN 26* 39* 43* 42* 42*  CREATININE 1.95* 2.58* 2.91* 2.35* 2.40*  CALCIUM 8.7* 8.5* 8.8* 8.7* 8.8*  MG 2.1 2.2  --   --  2.0  PHOS 4.5  --  4.4 4.0  --    GFR: Estimated Creatinine Clearance: 40.3 mL/min (A) (by C-G formula based on SCr of 2.4 mg/dL (H)). Liver Function Tests: Recent Labs  Lab 09/28/21 0320 09/29/21 0251 09/30/21 0151  AST  --   --  30  ALT  --   --  21  ALKPHOS  --   --  64  BILITOT  --   --  1.0  PROT  --   --  7.1  ALBUMIN 2.9* 2.8* 2.8*   No results for input(s): LIPASE, AMYLASE in the last 168 hours. No results for input(s): AMMONIA in the last 168 hours. Coagulation Profile: No results for input(s): INR, PROTIME in the last 168 hours. Cardiac Enzymes: No results for input(s): CKTOTAL, CKMB, CKMBINDEX, TROPONINI in the last 168 hours. BNP (last 3 results) No results for input(s): PROBNP in the last 8760  hours. HbA1C: No results for input(s): HGBA1C in the last 72 hours. CBG: Recent Labs  Lab 09/28/21 0535 09/29/21 0355 09/29/21 1207 09/29/21 2258 09/30/21 0632  GLUCAP 145* 134* 150* 127* 129*   Lipid Profile: No results for input(s): CHOL, HDL, LDLCALC, TRIG, CHOLHDL, LDLDIRECT in the last 72 hours. Thyroid Function Tests: No results for input(s): TSH, T4TOTAL, FREET4, T3FREE, THYROIDAB in the last 72 hours. Anemia Panel: No results for input(s): VITAMINB12, FOLATE, FERRITIN, TIBC, IRON, RETICCTPCT in the last 72 hours. Sepsis Labs: Recent Labs  Lab 09/29/21 1050 09/29/21 1314  PROCALCITON 1.70  --   LATICACIDVEN  --  1.6    Recent Results (from the past 240 hour(s))  Resp Panel by RT-PCR (Flu A&B, Covid) Nasopharyngeal Swab     Status: None   Collection Time: 09/25/21  2:51 AM   Specimen: Nasopharyngeal Swab; Nasopharyngeal(NP) swabs in vial transport medium  Result Value Ref Range Status   SARS Coronavirus 2 by RT PCR NEGATIVE NEGATIVE Final    Comment: (NOTE) SARS-CoV-2 target nucleic acids are NOT DETECTED.  The SARS-CoV-2 RNA is generally detectable in upper respiratory specimens during the acute phase of infection. The lowest concentration of SARS-CoV-2 viral copies this assay can detect is 138 copies/mL. A negative result does not preclude SARS-Cov-2 infection and should not be used as the sole basis for treatment or other patient management decisions. A negative result may occur with  improper specimen collection/handling, submission of specimen other than nasopharyngeal swab, presence of viral mutation(s) within the areas targeted by this assay, and inadequate number of viral copies(<138 copies/mL). A negative result must be combined with clinical observations, patient history, and epidemiological information. The expected result is Negative.  Fact Sheet for Patients:  BloggerCourse.com  Fact Sheet for Healthcare Providers:   SeriousBroker.it  This test is no t yet approved or cleared by the [place] FDA and  has been authorized for detection and/or diagnosis of SARS-CoV-2 by FDA under an Emergency Use Authorization (EUA). This EUA will remain  in effect (meaning this test can be  used) for the duration of the COVID-19 declaration under Section 564(b)(1) of the Act, 21 U.S.C.section 360bbb-3(b)(1), unless the authorization is terminated  or revoked sooner.       Influenza A by PCR NEGATIVE NEGATIVE Final   Influenza B by PCR NEGATIVE NEGATIVE Final    Comment: (NOTE) The Xpert Xpress SARS-CoV-2/FLU/RSV plus assay is intended as an aid in the diagnosis of influenza from Nasopharyngeal swab specimens and should not be used as a sole basis for treatment. Nasal washings and aspirates are unacceptable for Xpert Xpress SARS-CoV-2/FLU/RSV testing.  Fact Sheet for Patients: EntrepreneurPulse.com.au  Fact Sheet for Healthcare Providers: IncredibleEmployment.be  This test is not yet approved or cleared by the [place] FDA and has been authorized for detection and/or diagnosis of SARS-CoV-2 by FDA under an Emergency Use Authorization (EUA). This EUA will remain in effect (meaning this test can be used) for the duration of the COVID-19 declaration under Section 564(b)(1) of the Act, 21 U.S.C. section 360bbb-3(b)(1), unless the authorization is terminated or revoked.  Performed at [facility], [place] [address] [place], [place] [address]   Culture, blood (routine x 2)     Status: None (Preliminary result)   Collection Time: 09/29/21 11:38 AM   Specimen: Left Antecubital; Blood  Result Value Ref Range Status   Specimen Description LEFT ANTECUBITAL  Final   Special Requests   Final    BOTTLES DRAWN AEROBIC AND ANAEROBIC Blood Culture adequate volume   Culture   Final    NO GROWTH < 24 HOURS Performed at [facility],  [place] [address], [place], [place] [address]    Report Status PENDING  Incomplete  Culture, blood (routine x 2)     Status: None (Preliminary result)   Collection Time: 09/29/21 11:47 AM   Specimen: Right Antecubital; Blood  Result Value Ref Range Status   Specimen Description RIGHT ANTECUBITAL  Final   Special Requests   Final    BOTTLES DRAWN AEROBIC AND ANAEROBIC Blood Culture adequate volume   Culture   Final    NO GROWTH < 24 HOURS Performed at [facility], [place] [address] [place], [place] [address]    Report Status PENDING  Incomplete         Radiology Studies: US RENAL  Result Date: 09/28/2021 CLINICAL DATA:  Acute renal insufficiency EXAM: RENAL / URINARY TRACT ULTRASOUND COMPLETE COMPARISON:  10/28/2020 abdominal MRI. FINDINGS: Right Kidney: Renal measurements: 10.5 x 6.0 x 5.0 cm = volume: 167 mL. No hydronephrosis. Normal renal echogenicity. Interpolar right renal 1.0 cm cyst. Left Kidney: Renal measurements: 11.7 x 5.9 x 6.1 cm = volume: 217 mL. Normal renal echogenicity. No hydronephrosis. Bladder: Appears normal for degree of bladder distention. Other: Incidental note is made of soft tissue echogenicity within the gallbladder lumen at 2.6 cm. New since MRI of 10/28/2020. No gallbladder wall thickening or pericholecystic fluid. IMPRESSION: 1.  No acute process or explanation for renal insufficiency. 2. Soft tissue echogenicity within the gallbladder is favored to represent tumefactive sludge, given development since January. Recommend dedicated right upper quadrant ultrasound follow-up at 3 months. Electronically Signed   By: Abigail Miyamoto M.D.   On: 09/28/2021 16:58   DG CHEST PORT 1 VIEW  Result Date: 09/29/2021 CLINICAL DATA:  Shortness of breath, hypoxia. EXAM: PORTABLE CHEST 1 VIEW COMPARISON:  September 24, 2021. FINDINGS: Stable cardiomediastinal silhouette. Mild bibasilar atelectasis or edema is noted with small bilateral pleural effusions. Bony thorax is unremarkable.  IMPRESSION: Stable  bibasilar atelectasis or edema is noted with associated pleural effusions. Electronically Signed   By: Marijo Conception M.D.   On: 09/29/2021 13:39        Scheduled Meds:  amiodarone  200 mg Oral Daily   apixaban  5 mg Oral BID   clopidogrel  75 mg Oral q morning   gabapentin  600 mg Oral BID   hydrocortisone sod succinate (SOLU-CORTEF) inj  100 mg Intravenous Q8H   latanoprost  1 drop Both Eyes QHS   midodrine  5 mg Oral TID WC   oxyCODONE  10 mg Oral Q12H   pantoprazole  40 mg Oral BID   polyethylene glycol  17 g Oral Daily   rosuvastatin  20 mg Oral Daily   senna-docusate  1 tablet Oral Daily   sodium chloride flush  3 mL Intravenous Q12H   Continuous Infusions:  sodium chloride 75 mL/hr at 09/30/21 0641   ceFEPime (MAXIPIME) IV 2 g (09/29/21 2238)   vancomycin       LOS: 5 days    Time spent: 35 mins.More than 50% of that time was spent in counseling and/or coordination of care.      Shelly Coss, MD [facility] P12/15/2022, 8:11 AM

## 2021-09-30 NOTE — Progress Notes (Signed)
Progress Note  Patient Name: Lawrence Rose Date of Encounter: 09/30/2021  [facility] Cardiologist: Christell Constant, MD   Subjective   BP 92/53 today.   Afebrile this morning.  Creatinine stable at 2.4.  Denies any dyspnea  Inpatient Medications    Scheduled Meds:  amiodarone  200 mg Oral Daily   apixaban  5 mg Oral BID   clopidogrel  75 mg Oral q morning   gabapentin  600 mg Oral BID   hydrocortisone sod succinate (SOLU-CORTEF) inj  100 mg Intravenous Q8H   latanoprost  1 drop Both Eyes QHS   midodrine  5 mg Oral TID WC   oxyCODONE  10 mg Oral Q12H   pantoprazole  40 mg Oral BID   polyethylene glycol  17 g Oral Daily   rosuvastatin  20 mg Oral Daily   senna-docusate  1 tablet Oral Daily   sodium chloride flush  3 mL Intravenous Q12H   Continuous Infusions:  ceFEPime (MAXIPIME) IV 2 g (09/29/21 2238)   vancomycin     PRN Meds: acetaminophen **OR** acetaminophen, albuterol, alum & mag hydroxide-simeth, HYDROcodone-acetaminophen, ibuprofen, neomycin-bacitracin-polymyxin, ondansetron **OR** ondansetron (ZOFRAN) IV   Vital Signs    Vitals:   09/30/21 0500 09/30/21 0618 09/30/21 0635 09/30/21 0700  BP: (!) 95/48 (!) 86/53 (!) 101/51 (!) 92/53  Pulse: 75 73 75 74  Resp: 15 16 17 19   Temp: 98.3 F (36.8 C)  98.4 F (36.9 C) 99 F (37.2 C)  TempSrc: Rectal  Rectal Rectal  SpO2: 96% 98% 100% 100%  Weight: 125.4 kg     Height:        Intake/Output Summary (Last 24 hours) at 09/30/2021 0906 Last data filed at 09/30/2021 0600 Gross per 24 hour  Intake 1714.55 ml  Output 2925 ml  Net -1210.45 ml    Last 3 Weights 09/30/2021 09/29/2021 09/28/2021  Weight (lbs) 276 lb 7.3 oz 270 lb 4.8 oz 280 lb 3.3 oz  Weight (kg) 125.4 kg 122.607 kg 127.1 kg      Telemetry    NSR 60s - Personally Reviewed  ECG    No new ECG - Personally Reviewed  Physical Exam   GEN: in no acute distress Neck: No JVD Cardiac: RRR, no murmurs, rubs, or gallops.   Respiratory: Clear to auscultation bilaterally. GI: Soft, nontender, non-distended  MS: No edema; No deformity. Neuro:  Nonfocal  Psych: Normal affect   Labs    High Sensitivity Troponin:   Recent Labs  Lab 09/25/21 0207 09/25/21 0415  TROPONINIHS 13 17      Chemistry Recent Labs  Lab 09/26/21 0301 09/27/21 0139 09/28/21 0320 09/29/21 0251 09/30/21 0151  NA 136 136 135 136 141  K 3.7 4.4 4.6 4.1 4.1  CL 96* 94* 93* 96* 99  CO2 32 32 29 34* 25  GLUCOSE 107* 121* 122* 112* 125*  BUN 26* 39* 43* 42* 42*  CREATININE 1.95* 2.58* 2.91* 2.35* 2.40*  CALCIUM 8.7* 8.5* 8.8* 8.7* 8.8*  MG 2.1 2.2  --   --  2.0  PROT  --   --   --   --  7.1  ALBUMIN  --   --  2.9* 2.8* 2.8*  AST  --   --   --   --  30  ALT  --   --   --   --  21  ALKPHOS  --   --   --   --  64  BILITOT  --   --   --   --  1.0  GFRNONAA 37* 26* 23* 30* 29*  ANIONGAP 8 10 13 6  17*     Lipids No results for input(s): CHOL, TRIG, HDL, LABVLDL, LDLCALC, CHOLHDL in the last 168 hours.  Hematology Recent Labs  Lab 09/24/21 1712 09/25/21 0318 09/29/21 1050 09/30/21 0151  WBC 7.4  --  13.8* 25.6*  RBC 3.67*  --  4.16* 3.77*  HGB 9.8* 12.6* 11.3* 10.0*  HCT 32.5* 37.0* 36.5* 34.1*  MCV 88.6  --  87.7 90.5  MCH 26.7  --  27.2 26.5  MCHC 30.2  --  31.0 29.3*  RDW 15.5  --  14.9 15.3  PLT 222  --  264 216    Thyroid No results for input(s): TSH, FREET4 in the last 168 hours.  BNP Recent Labs  Lab 09/25/21 0207  BNP 566.6*     DDimer No results for input(s): DDIMER in the last 168 hours.   Radiology    US RENAL  Result Date: 09/28/2021 CLINICAL DATA:  Acute renal insufficiency EXAM: RENAL / URINARY TRACT ULTRASOUND COMPLETE COMPARISON:  10/28/2020 abdominal MRI. FINDINGS: Right Kidney: Renal measurements: 10.5 x 6.0 x 5.0 cm = volume: 167 mL. No hydronephrosis. Normal renal echogenicity. Interpolar right renal 1.0 cm cyst. Left Kidney: Renal measurements: 11.7 x 5.9 x 6.1 cm = volume: 217 mL.  Normal renal echogenicity. No hydronephrosis. Bladder: Appears normal for degree of bladder distention. Other: Incidental note is made of soft tissue echogenicity within the gallbladder lumen at 2.6 cm. New since MRI of 10/28/2020. No gallbladder wall thickening or pericholecystic fluid. IMPRESSION: 1.  No acute process or explanation for renal insufficiency. 2. Soft tissue echogenicity within the gallbladder is favored to represent tumefactive sludge, given development since January. Recommend dedicated right upper quadrant ultrasound follow-up at 3 months. Electronically Signed   By: Abigail Miyamoto M.D.   On: 09/28/2021 16:58   DG CHEST PORT 1 VIEW  Result Date: 09/29/2021 CLINICAL DATA:  Shortness of breath, hypoxia. EXAM: PORTABLE CHEST 1 VIEW COMPARISON:  September 24, 2021. FINDINGS: Stable cardiomediastinal silhouette. Mild bibasilar atelectasis or edema is noted with small bilateral pleural effusions. Bony thorax is unremarkable. IMPRESSION: Stable bibasilar atelectasis or edema is noted with associated pleural effusions. Electronically Signed   By: Marijo Conception M.D.   On: 09/29/2021 13:39    Cardiac Studies   Echo 09/27/21:  1. Left ventricular ejection fraction, by estimation, is 60 to 65%. The  left ventricle has normal function. The left ventricle has no regional  wall motion abnormalities. There is mild left ventricular hypertrophy.  Left ventricular diastolic parameters  were normal.   2. Right ventricular systolic function is normal. The right ventricular  size is normal.   3. Left atrial size was moderately dilated.   4. Right atrial size was moderately dilated.   5. The pericardial effusion is lateral to the left ventricle.   6. The mitral valve is degenerative. No evidence of mitral valve  regurgitation. No evidence of mitral stenosis. Moderate mitral annular  calcification.   7. Post 29 mm [employer] supra annular Core valve stable gradients since  01/19/21 trivial AR likely  PVL . The aortic valve has been  repaired/replaced. Aortic valve regurgitation is trivial. No aortic  stenosis is present.   8. The inferior vena cava is normal in size with greater than 50%  respiratory variability, suggesting right atrial pressure of 3 mmHg.   Patient Profile     65 y.o. male  with a hx  of HTN, HFpEF, severe AS s/p TAVR 08/2020, pAF/AFL, DM, CKDIII, R TKA, OSA on CPAP who is being seen 09/26/2021 for the evaluation of decompensated HF  Assessment & Plan    Acute on Chronic Diastolic Heart Failure: Likely due to stopping Lasix as outpatient.  Chest x-ray consistent with progressive CHF.  BNP 567.  Resolved with IV diuresis but developed worsening renal function, improved with holding diuresis  -Appears euvolemic, renal function stable, continue to hold diuretics  Sepsis: Spiked fever on 12/14.  Procalcitonin elevated.  Normal lactate.  Unclear source, cultures pending.  Started on vancomycin/cefepime per primary team.   AKI on CKD: Creatinine 1.6 on admission, which appears baseline, had increased to 2.9, down to 2.35 yesterday with holding diuresis.  Continue to hold diuretics.  Appears euvolemic currently, can give IV fluids if needed if having hypotension in setting of sepssis   Paroxysmal atrial fibrillation: Currently in sinus rhythm -Continue amiodarone, Eliquis.  Holding metoprolol, diltiazem given soft pressures   Severe aortic stenosis status post TAVR 08/2020: Echo showed normal functioning bioprosthetic valve   Hypertension: BP has been soft.  Holding home antihypertensives    For questions or updates, please contact [place] Please consult [email or web address] for contact info under        Signed, Donato Heinz, MD  09/30/2021, 9:06 AM

## 2021-09-30 NOTE — Consult Note (Signed)
NAME:  Lawrence Rose, MRN:  [id number], DOB:  [birth year], LOS: 5 ADMISSION DATE:  09/24/2021, CONSULTATION DATE:  09/30/2021 REFERRING MD:  Dr Tawanna Solo, CHIEF COMPLAINT:  hypotension   History of Present Illness:  [name] is a 65 year old male with PMHx of HFpEF, severe aortic stenosis s/p TAVR 08/2020, hypertension, paroxysmal atrial fibrillation/flutter s/p DCCV on chronic anticoagulation, type II diabetes mellitus, CKDIII, OSA on CPAP, right TKA 07/2021 who presented with progressively worsening shortness of breath for several days admitted for acute on chronic heart failure exacerbation after outpatient discontinuation of his lasix by PCP. Patient had been diuresing well during this admission with IV Lasix with 7L off since admission. Lasix discontinued on 12/12 due to worsening renal function with sCr up to 2.9 (baseline sCr 1.5) and patient appearing euvolemic. Throughout the day on 12/14, patient more lethargic and developed fevers with Tmax 104.8 for which patient given tylenol. Noted to have worsening leukocytosis for and intermittent episodes of hypotension; blood cultures, UA, and CXR obtained and patient started on broad spectrum antibiotics. Procal elevated to 1.7. ABG significant for pH 7.32, pCO2 60.4, pO2 51.9 for which patient placed on BiPAP. Overnight continued to have intermittent episodes of hypotension for which started on maintenance fluids. PCCM consulted.   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 Angelena Form and Dr. Cyndia Bent.    Severe aortic stenosis    Sleep apnea    wears CPAP every night   Significant Hospital Events: Including procedures, antibiotic start and stop dates in addition to other pertinent events   12/10 admitted for acute on chronic hypoxic respiratory failure 2/2 HFpEF exacerbation, diuresis started, cardiology consulted. 12/14 developed fevers to Tmax 104.8 and intermittent episodes of hypotension, started on BiPAP for lethargy and hypercarbia on ABG 12/15 PCCM consulted for ongoing hypotension and fevers, concerns for sepsis   Interim History / Subjective:  Patient evaluated at bedside. He is on BiPAP. He wakes up to voice and denies any pain at this time.   Objective   Blood pressure (!) 92/53, pulse 74, temperature 99 F (37.2 C), temperature source Rectal, resp. rate 19, height _0  (1.803 m), weight 125.4 kg, SpO2 100 %.    FiO2 (%):  [30 %-40 %] 30 %   Intake/Output Summary (Last 24 hours) at 09/30/2021 0853 Last data filed at 09/30/2021 0600 Gross per 24 hour  Intake 1714.55 ml  Output 2925 ml  Net -1210.45 ml   Filed Weights   09/28/21 0500 09/29/21 0352 09/30/21 0500  Weight: 127.1 kg 122.6 kg 125.4 kg    Examination: General: acutely ill appearing elderly male, on BiPAP HENT: [place]/AT, anicteric sclerae, EOMI, trachea midline Lungs: diffusely coarse breath sounds; no wheezing appreciated; on BiPAP Cardiovascular: RRR, S1 and S2 present, no m/r/g Abdomen: soft, nondistended, nontender, +BS Extremities: warm and dry, trace pitting edema  of RLE compared to left Neuro: awakens to voice, no apparent focal deficits Skin: no rash   Resolved Hospital Problem list    Assessment & Plan:  Sepsis of unclear etiology, suspect viral vs HCAP Acute on chronic respiratory failure 2/2 presumed HFpEF exacerbation vs respiratory virus vs HCAP Patient presented with shortness of breath for several days and admitted for HFpEF exacerbation for  which he has been diuresing appropriately and is currently euvolemic. Initial COVID/Flu swab negative. On 12/14, developed fevers and intermittent episodes of hypotension and increasing leukocytosis. Placed on BiPAP for lethargy and increased pCO2 on ABG with improvement. Procal elevated to 1.7; lactic acid wnl. UA with moderate leukocytes. Echo from 12/12 with normal bioprosthetic valve without evidence of vegetation.  - Continue vancomycin and cefepime  - LR bolus 1L - Resp virus panel  - F/u blood and urine cultures  - Transition to HFNC as tolerated, goal SpO2 >90%  Acute on chronic HFpEF exacerbation Admitted for HFpEF exacerbation; has been diuresed with IV Lasix with ~20L off; net -16L since admission. IV Lasix held since 12/12 due to worsening renal function. Appears euvolemic on exam.  - Cardiology following, appreciate recs - Holding diuretics at this time   AKI on CKDIII Baseline sCr ~1.6. Trended up to 2.9 in setting of diuresis. Improved and stable ~2.4 this AM. Has had good UOP over past 24 hrs (2.9L). UA with moderate leukocytes. - Trend renal function - Avoid nephrotoxic agents  Chronic normocytic anemia Appears to be at baseline this morning.  - Trend CBC  Hx of PAF  Hypertension Currently NSR  - Continue Eliquis and amiodarone  - Home metoprolol and diltiazem held in setting of soft BP's  - Continue cardiac monitoring  CAD  - Continued on home plavix, crestor   OSA on CPAP - Transition to nightly CPAP   Chronic pain syndrome  - Continue gabapentin, scheduled oxycodone and prn Norco; may need to adjust to hydromorphone or fentanyl if renal function is worsening  Best Practice (right click and "Reselect all SmartList Selections" daily)   Diet/type: Regular consistency (see orders) DVT prophylaxis: DOAC GI prophylaxis: PPI Lines: N/A Foley:  N/A Code Status:  full code Last date of multidisciplinary goals of care discussion [pending]  Labs   CBC: Recent  Labs  Lab 09/24/21 1712 09/25/21 0318 09/29/21 1050 09/30/21 0151  WBC 7.4  --  13.8* 25.6*  NEUTROABS  --   --   --  20.7*  HGB 9.8* 12.6* 11.3* 10.0*  HCT 32.5* 37.0* 36.5* 34.1*  MCV 88.6  --  87.7 90.5  PLT 222  --  264 657    Basic Metabolic Panel: Recent Labs  Lab 09/26/21 0301 09/27/21 0139 09/28/21 0320 09/29/21 0251 09/30/21 0151  NA 136 136 135 136 141  K 3.7 4.4 4.6 4.1 4.1  CL 96* 94* 93* 96* 99  CO2 32 32 29 34* 25  GLUCOSE 107* 121* 122* 112* 125*  BUN 26* 39* 43* 42* 42*  CREATININE 1.95* 2.58* 2.91* 2.35* 2.40*  CALCIUM 8.7* 8.5* 8.8* 8.7* 8.8*  MG 2.1 2.2  --   --  2.0  PHOS 4.5  --  4.4 4.0  --    GFR: Estimated Creatinine Clearance: 40.3 mL/min (A) (by C-G formula based on SCr of 2.4 mg/dL (H)). Recent Labs  Lab 09/24/21 1712 09/29/21 1050 09/29/21 1314 09/30/21 0151  PROCALCITON  --  1.70  --   --   WBC 7.4 13.8*  --  25.6*  LATICACIDVEN  --   --  1.6  --     Liver Function Tests: Recent Labs  Lab 09/28/21 0320 09/29/21 0251 09/30/21 0151  AST  --   --  30  ALT  --   --  21  ALKPHOS  --   --  64  BILITOT  --   --  1.0  PROT  --   --  7.1  ALBUMIN 2.9* 2.8* 2.8*   No results for input(s): LIPASE, AMYLASE in the last 168 hours. No results for input(s): AMMONIA in the last 168 hours.  ABG    Component Value Date/Time   PHART 7.373 09/29/2021 2208   PCO2ART 50.4 (H) 09/29/2021 2208   PO2ART 111 (H) 09/29/2021 2208   HCO3 28.3 (H) 09/29/2021 2208   TCO2 29 09/25/2021 0318   ACIDBASEDEF 2.0 09/01/2020 1159   O2SAT 98.1 09/29/2021 2208     Coagulation Profile: No results for input(s): INR, PROTIME in the last 168 hours.  Cardiac Enzymes: No results for input(s): CKTOTAL, CKMB, CKMBINDEX, TROPONINI in the last 168 hours.  HbA1C: Hgb A1c MFr Bld  Date/Time Value Ref Range Status  09/26/2021 03:01 AM 5.2 4.8 - 5.6 % Final    Comment:    (NOTE) Pre diabetes:          5.7%-6.4%  Diabetes:              >6.4%  Glycemic  control for   <7.0% adults with diabetes   01/18/2021 02:53 PM 5.6 4.8 - 5.6 % Final    Comment:    (NOTE) Pre diabetes:          5.7%-6.4%  Diabetes:              >6.4%  Glycemic control for   <7.0% adults with diabetes     CBG: Recent Labs  Lab 09/28/21 0535 09/29/21 0355 09/29/21 1207 09/29/21 2258 09/30/21 0632  GLUCAP 145* 134* 150* 127* 129*    Review of Systems:   Unable to obtain   Past Medical History:  He,  has a past medical history of Acid reflux, Allergic arthritis, hand, Anemia, Apnea, Asthma, Carpal tunnel syndrome, Chronic back pain, Chronic kidney disease, Chronic knee pain, Diabetes mellitus without complication ([place]), Dysrhythmia, GI bleeding (09/2020), Hypertension, S/P TAVR (transcatheter aortic valve replacement) (09/01/2020), Severe aortic stenosis, and Sleep apnea.   Surgical History:   Past Surgical History:  Procedure Laterality Date   BACK SURGERY     CARDIOVERSION  01/18/2021   CARDIOVERSION N/A 01/18/2021   Procedure: CARDIOVERSION;  Surgeon: Werner Lean, MD;  Location: [facility];  Service: Cardiovascular;  Laterality: N/A;   ESOPHAGOGASTRODUODENOSCOPY (EGD) WITH PROPOFOL N/A 10/01/2020   Procedure: ESOPHAGOGASTRODUODENOSCOPY (EGD) WITH PROPOFOL;  Surgeon: [name], MD;  Location: [facility];  Service: Endoscopy;  Laterality: N/A;   RIGHT/LEFT HEART CATH AND CORONARY ANGIOGRAPHY N/A 08/18/2020   Procedure: RIGHT/LEFT HEART CATH AND CORONARY ANGIOGRAPHY;  Surgeon: [name], MD;  Location: [facility];  Service: Cardiovascular;  Laterality: N/A;   TEE WITHOUT CARDIOVERSION N/A 08/21/2020   Procedure: TRANSESOPHAGEAL ECHOCARDIOGRAM (TEE);  Surgeon: [name], MD;  Location[facility];  Service: Cardiovascular;  Laterality: N/A;   TEE WITHOUT CARDIOVERSION N/A 09/01/2020   Procedure: TRANSESOPHAGEAL ECHOCARDIOGRAM (TEE);  Surgeon: [name], MD;  Location: [facility];   Service: Open Heart Surgery;  Laterality: N/A;   TRANSCATHETER AORTIC VALVE REPLACEMENT, TRANSFEMORAL N/A 09/01/2020   Procedure:  TRANSCATHETER AORTIC VALVE REPLACEMENT, TRANSFEMORAL;  Surgeon: [name], MD;  Location: [place] [facility];  Service: Open Heart Surgery;  Laterality: N/A;     Social History:   reports that he quit smoking about 32 years ago. His smoking use included cigarettes. He has a 15.00 pack-year smoking history. He has never used smokeless tobacco. He reports that he does not currently use alcohol. He reports that he does not currently use drugs.   Family History:  His family history is not on file.   Allergies Allergies  Allergen Reactions   Cocoa Hives   Sulfamethoxazole-Trimethoprim Anaphylaxis   Chocolate Hives   Citrus Hives    Reaction to oranges and orange juice   Fish Allergy Hives and Swelling    Throat swelling   Shellfish Allergy Hives and Swelling    Throat swelling   Sulfa Antibiotics Hives and Swelling    Throat swelling   Tetracycline Swelling     Home Medications  Prior to Admission medications   Medication Sig Start Date End Date Taking? Authorizing Provider  amiodarone (PACERONE) 200 MG tablet Take 1 tablet by mouth once daily Patient taking differently: Take 200 mg by mouth daily. 09/22/21  Yes Chandrasekhar, Mahesh A, MD  apixaban (ELIQUIS) 5 MG TABS tablet Take 1 tablet (5 mg total) by mouth 2 (two) times daily. 05/21/21  Yes Lendon Colonel, NP  Blood Glucose Monitoring Suppl (FIFTY50 GLUCOSE METER 2.0) w/Device KIT 1 each by Other route in the morning, at noon, and at bedtime. 05/16/19  Yes [provider]  Cholecalciferol 25 MCG (1000 UT) tablet Take 1,000 Units by mouth at bedtime.    Yes [provider]  clopidogrel (PLAVIX) 75 MG tablet Take 75 mg by mouth every morning. 08/10/21  Yes [provider]  furosemide (LASIX) 40 MG tablet Take 1 tablet (40 mg total) by mouth daily. 03/12/21  Yes  Chandrasekhar, Mahesh A, MD  gabapentin (NEURONTIN) 300 MG capsule Take 600 mg by mouth 2 (two) times daily.  08/12/20  Yes [provider]  hydrALAZINE (APRESOLINE) 25 MG tablet Take 1 tablet (25 mg total) by mouth every 8 (eight) hours. 05/21/21  Yes Lendon Colonel, NP  HYDROcodone-acetaminophen (NORCO) 10-325 MG tablet Take 1 tablet by mouth every 6 (six) hours as needed for moderate pain.  08/04/20  Yes [provider]  Iron-Vitamins (GERITOL PO) Take 1 tablet by mouth daily.   Yes [provider]  latanoprost (XALATAN) 0.005 % ophthalmic solution Place 1 drop into both eyes at bedtime.  07/27/20  Yes [provider]  lisinopril (ZESTRIL) 40 MG tablet Take 40 mg by mouth daily. 07/13/21  Yes [provider]  metFORMIN (GLUCOPHAGE) 1000 MG tablet Take 1,000 mg by mouth 2 (two) times daily. 07/10/20  Yes [provider]  metoprolol tartrate (LOPRESSOR) 50 MG tablet Take 1 tablet (50 mg total) by mouth 2 (two) times daily. 10/14/20  Yes Eileen Stanford, PA-C  Multiple Vitamin (MULTIVITAMIN WITH MINERALS) TABS tablet Take 1 tablet by mouth at bedtime.    Yes [provider]  neomycin-bacitracin-polymyxin (NEOSPORIN) [phone] ointment Apply 1 application topically 2 (two) times daily as needed (itching).   Yes [provider]  oxyCODONE ER (XTAMPZA ER) 9 MG C12A Take 9 mg by mouth every 12 (twelve) hours.   Yes [provider]  pantoprazole (PROTONIX) 40 MG tablet Take 40 mg by mouth 2 (two) times daily. 05/26/20  Yes [provider]  PRESCRIPTION MEDICATION  Inhale into the lungs at bedtime. CPAP   Yes [provider]  rosuvastatin (CRESTOR) 20 MG tablet Take 20 mg by mouth daily.  05/25/20  Yes [provider]  TIADYLT ER 240 MG 24 hr capsule Take 240 mg by mouth daily as needed (hypertension). 07/13/21  Yes [provider]  albuterol (VENTOLIN HFA) 108 (90 Base) MCG/ACT inhaler  Inhale 2 puffs into the lungs every 4 (four) hours as needed for wheezing or shortness of breath. 02/26/21   Laurin Coder, MD  amoxicillin (AMOXIL) 500 MG capsule Take 2000 mg at least 1 hour prior to any dental procedure. Patient not taking: Reported on 09/25/2021 10/30/20   Werner Lean, MD     Critical care time:    Harvie Heck, MD Internal Medicine, PGY-3 09/30/21 9:49 AM Pager # [phone]

## 2021-09-30 NOTE — Progress Notes (Signed)
Temp = 99.3 F/ rectal, Turned off cooling blanket.

## 2021-09-30 NOTE — Progress Notes (Signed)
Heart Failure Nurse Navigator Progress Note  Tansferring to 2H ICU. Will continue to follow this hospitalization.  Pushed HV TOC clinic appt back to 12/28 @ 2pm.  Ozella Rocks, MSN, RN Heart Failure Nurse Navigator [phone]

## 2021-09-30 NOTE — Progress Notes (Signed)
RT NOTES: Removed patient from bipap and placed on 3lpm nasal cannula. Sats 100%. Will continue to monitor.

## 2021-09-30 NOTE — Care Management Important Message (Signed)
Important Message  Patient Details  Name: Lawrence Rose MRN: [id number] Date of Birth: 1956/05/28   Medicare Important Message Given:  Yes     Renie Ora 09/30/2021, 10:33 AM

## 2021-09-30 NOTE — Progress Notes (Signed)
HOSPITAL MEDICINE OVERNIGHT EVENT NOTE    Notified by nursing that patient has been exhibiting intermittent bouts of hypotension throughout the late evening and early morning.  Chart reviewed, patient has been exhibiting fevers and leukocytosis since midday on 12/14.  Patient was initiated on intravenous vancomycin and cefepime at that time.   To address patient's persisting fevers a cooling blanket was initiated.  Patient was eventually taken off of the cooling blanket once no longer febrile but began to exhibit bouts of persisting hypotension.  Review of the chart reveals that patient was actively diuresed several days prior.  In an attempt to avoid reintroducing volume overload we monitored the patient closely throughout the evening.  If patient's hypotension worsens or if patient exhibits any associated confusion weakness or lethargy then initiation of isotonic fluids would need to be reconsidered.  Continue broad-spectrum antibiotic therapy for now.  Marinda Elk  MD [facility]

## 2021-10-01 DIAGNOSIS — J9602 Acute respiratory failure with hypercapnia: Secondary | ICD-10-CM

## 2021-10-01 LAB — RENAL FUNCTION PANEL
Albumin: 2.3 g/dL — ABNORMAL LOW (ref 3.5–5.0)
Anion gap: 6 (ref 5–15)
BUN: 39 mg/dL — ABNORMAL HIGH (ref 8–23)
CO2: 30 mmol/L (ref 22–32)
Calcium: 8.1 mg/dL — ABNORMAL LOW (ref 8.9–10.3)
Chloride: 99 mmol/L (ref 98–111)
Creatinine, Ser: 2.14 mg/dL — ABNORMAL HIGH (ref 0.61–1.24)
GFR, Estimated: 33 mL/min — ABNORMAL LOW (ref >60)
Glucose, Bld: 175 mg/dL — ABNORMAL HIGH (ref 70–99)
Phosphorus: 3.1 mg/dL (ref 2.5–4.6)
Potassium: 3.6 mmol/L (ref 3.5–5.1)
Sodium: 135 mmol/L (ref 135–145)

## 2021-10-01 LAB — CBC
HCT: 28.1 % — ABNORMAL LOW (ref 39.0–52.0)
Hemoglobin: 8.6 g/dL — ABNORMAL LOW (ref 13.0–17.0)
MCH: 26.9 pg (ref 26.0–34.0)
MCHC: 30.6 g/dL (ref 30.0–36.0)
MCV: 87.8 fL (ref 80.0–100.0)
Platelets: 216 10*3/uL (ref 150–400)
RBC: 3.2 MIL/uL — ABNORMAL LOW (ref 4.22–5.81)
RDW: 15.3 % (ref 11.5–15.5)
WBC: 21.6 10*3/uL — ABNORMAL HIGH (ref 4.0–10.5)
nRBC: 0 % (ref 0.0–0.2)

## 2021-10-01 LAB — GLUCOSE, CAPILLARY
Glucose-Capillary: 114 mg/dL — ABNORMAL HIGH (ref 70–99)
Glucose-Capillary: 151 mg/dL — ABNORMAL HIGH (ref 70–99)
Glucose-Capillary: 146 mg/dL — ABNORMAL HIGH (ref 70–99)
Glucose-Capillary: 250 mg/dL — ABNORMAL HIGH (ref 70–99)

## 2021-10-01 LAB — MRSA NEXT GEN BY PCR, NASAL: MRSA by PCR Next Gen: NOT DETECTED

## 2021-10-01 LAB — URINE CULTURE: Culture: >=100000 — AB

## 2021-10-01 MED ORDER — POTASSIUM CHLORIDE CRYS ER 20 MEQ PO TBCR
20.0000 meq | EXTENDED_RELEASE_TABLET | Freq: Once | ORAL | Status: AC
  Administered 2021-10-01: 20 meq via ORAL
  Filled 2021-10-01: qty 1

## 2021-10-01 MED ORDER — INSULIN ASPART 100 UNIT/ML IJ SOLN
0.0000 [IU] | Freq: Three times a day (TID) | INTRAMUSCULAR | Status: DC
  Administered 2021-10-01: 1 [IU] via SUBCUTANEOUS
  Administered 2021-10-02: 2 [IU] via SUBCUTANEOUS
  Administered 2021-10-02: 1 [IU] via SUBCUTANEOUS
  Administered 2021-10-02: 2 [IU] via SUBCUTANEOUS
  Administered 2021-10-03: 07:00:00 1 [IU] via SUBCUTANEOUS
  Administered 2021-10-03: 17:00:00 3 [IU] via SUBCUTANEOUS
  Administered 2021-10-03: 12:00:00 2 [IU] via SUBCUTANEOUS
  Administered 2021-10-04: 08:00:00 1 [IU] via SUBCUTANEOUS

## 2021-10-01 NOTE — Progress Notes (Incomplete)
Progress Note  Patient Name: Lawrence Rose Date of Encounter: 10/01/2021  [facility] Cardiologist: Christell Constant, MD   Subjective     Inpatient Medications    Scheduled Meds:  amiodarone  200 mg Oral Daily   apixaban  5 mg Oral BID   Chlorhexidine Gluconate Cloth  6 each Topical Daily   clopidogrel  75 mg Oral q morning   gabapentin  600 mg Oral BID   insulin aspart  0-9 Units Subcutaneous TID WC   latanoprost  1 drop Both Eyes QHS   oxyCODONE  10 mg Oral Q12H   pantoprazole  40 mg Oral BID   polyethylene glycol  17 g Oral Daily   rosuvastatin  20 mg Oral Daily   senna-docusate  1 tablet Oral Daily   sodium chloride flush  3 mL Intravenous Q12H   Continuous Infusions:  ceFEPime (MAXIPIME) IV 2 g (10/01/21 0943)   PRN Meds: acetaminophen **OR** acetaminophen, albuterol, alum & mag hydroxide-simeth, HYDROcodone-acetaminophen, neomycin-bacitracin-polymyxin, ondansetron **OR** ondansetron (ZOFRAN) IV   Vital Signs    Vitals:   10/01/21 1600 10/01/21 1622 10/01/21 1700 10/01/21 1900  BP: (!) 107/55  (!) 85/44   Pulse: 62  73   Resp: 11  14   Temp:  97.8 F (36.6 C)  98.7 F (37.1 C)  TempSrc:  Oral  Oral  SpO2: 100%  92%   Weight:      Height:        Intake/Output Summary (Last 24 hours) at 10/01/2021 2047 Last data filed at 10/01/2021 0500 Gross per 24 hour  Intake 240 ml  Output 1100 ml  Net -860 ml    Last 3 Weights 10/01/2021 09/30/2021 09/29/2021  Weight (lbs) 274 lb 7.6 oz 276 lb 7.3 oz 270 lb 4.8 oz  Weight (kg) 124.5 kg 125.4 kg 122.607 kg      Telemetry    NSR 70-80s - Personally Reviewed  ECG    No new ECG - Personally Reviewed  Physical Exam   GEN: in no acute distress Neck: No JVD Cardiac: RRR, no murmurs, rubs, or gallops.  Respiratory: Clear to auscultation bilaterally. GI: Soft, nontender, non-distended  MS: No edema; No deformity. Neuro:  Nonfocal  Psych: Normal affect   Labs    High Sensitivity Troponin:    Recent Labs  Lab 09/25/21 0207 09/25/21 0415  TROPONINIHS 13 17      Chemistry Recent Labs  Lab 09/26/21 0301 09/27/21 0139 09/28/21 0320 09/29/21 0251 09/30/21 0151 10/01/21 0853  NA 136 136   < > 136 141 135  K 3.7 4.4   < > 4.1 4.1 3.6  CL 96* 94*   < > 96* 99 99  CO2 32 32   < > 34* 25 30  GLUCOSE 107* 121*   < > 112* 125* 175*  BUN 26* 39*   < > 42* 42* 39*  CREATININE 1.95* 2.58*   < > 2.35* 2.40* 2.14*  CALCIUM 8.7* 8.5*   < > 8.7* 8.8* 8.1*  MG 2.1 2.2  --   --  2.0  --   PROT  --   --   --   --  7.1  --   ALBUMIN  --   --    < > 2.8* 2.8* 2.3*  AST  --   --   --   --  30  --   ALT  --   --   --   --  21  --  ALKPHOS  --   --   --   --  64  --   BILITOT  --   --   --   --  1.0  --   GFRNONAA 37* 26*   < > 30* 29* 33*  ANIONGAP 8 10   < > 6 17* 6   < > = values in this interval not displayed.     Lipids No results for input(s): CHOL, TRIG, HDL, LABVLDL, LDLCALC, CHOLHDL in the last 168 hours.  Hematology Recent Labs  Lab 09/29/21 1050 09/30/21 0151 10/01/21 0853  WBC 13.8* 25.6* 21.6*  RBC 4.16* 3.77* 3.20*  HGB 11.3* 10.0* 8.6*  HCT 36.5* 34.1* 28.1*  MCV 87.7 90.5 87.8  MCH 27.2 26.5 26.9  MCHC 31.0 29.3* 30.6  RDW 14.9 15.3 15.3  PLT 264 216 216    Thyroid No results for input(s): TSH, FREET4 in the last 168 hours.  BNP Recent Labs  Lab 09/25/21 0207  BNP 566.6*     DDimer No results for input(s): DDIMER in the last 168 hours.   Radiology    No results found.  Cardiac Studies   Echo 09/27/21:  1. Left ventricular ejection fraction, by estimation, is 60 to 65%. The  left ventricle has normal function. The left ventricle has no regional  wall motion abnormalities. There is mild left ventricular hypertrophy.  Left ventricular diastolic parameters  were normal.   2. Right ventricular systolic function is normal. The right ventricular  size is normal.   3. Left atrial size was moderately dilated.   4. Right atrial size was  moderately dilated.   5. The pericardial effusion is lateral to the left ventricle.   6. The mitral valve is degenerative. No evidence of mitral valve  regurgitation. No evidence of mitral stenosis. Moderate mitral annular  calcification.   7. Post 29 mm Medtronic supra annular Core valve stable gradients since  01/19/21 trivial AR likely PVL . The aortic valve has been  repaired/replaced. Aortic valve regurgitation is trivial. No aortic  stenosis is present.   8. The inferior vena cava is normal in size with greater than 50%  respiratory variability, suggesting right atrial pressure of 3 mmHg.   Patient Profile     65 y.o. male  with a hx of HTN, HFpEF, severe AS s/p TAVR 08/2020, pAF/AFL, DM, CKDIII, R TKA, OSA on CPAP who is being seen 09/26/2021 for the evaluation of decompensated HF  Assessment & Plan    Acute on Chronic Diastolic Heart Failure: Presented initially with volume overload, likely due to stopping Lasix as outpatient.  Chest x-ray consistent with progressive CHF.  BNP 567.  Resolved with IV diuresis but developed worsening renal function, improved with holding diuresis.  On 12/14 began spiking fevers and became hypotensive -Appears euvolemic, continue to hold diuretics.  Can give fluids as needed for hypotension in setting of sepsis  Sepsis: Spiked fever on 12/14.  Procalcitonin elevated.  Normal lactate.  Started on vancomycin/cefepime per primary team.  Urine culture growing Pseudomonas.  Blood cultures NGTD -F/u blood cultures, if cultures positive will need TEE given prosthetic aortic valve   AKI on CKD: Creatinine 1.6 on admission, which appears baseline, had increased to 2.9, down to 2.35 with holding diuresis.  Continue to hold diuretics.  Appears euvolemic currently, can give IV fluids if needed if having hypotension in setting of sepssis -No labs this morning, will order   Paroxysmal atrial fibrillation: Currently in sinus rhythm -Continue  amiodarone, Eliquis.   Holding metoprolol, diltiazem given soft pressures   Severe aortic stenosis status post TAVR 08/2020: Echo showed normal functioning bioprosthetic valve   Hypertension: BP has been soft.  Holding home antihypertensives    For questions or updates, please contact [place] Please consult [email or web address] for contact info under        Signed, Freada Bergeron, MD  10/01/2021, 8:47 PM

## 2021-10-01 NOTE — Progress Notes (Signed)
Patient arrived to unit in wheelchair. VSS. NSR. Will continue to monitor.

## 2021-10-01 NOTE — Progress Notes (Addendum)
Report given to nurse in 3E.  2250- Transferred to 3E 01 per wheelchair accompanied by RN. Alert and oriented.VSS

## 2021-10-01 NOTE — Progress Notes (Signed)
Progress Note  Patient Name: Lawrence Rose Date of Encounter: 10/01/2021  [facility] Cardiologist: Christell Constant, MD   Subjective   BP 90/46 this morning.  Afebrile. Denies any chest pain or dyspnea.    Inpatient Medications    Scheduled Meds:  amiodarone  200 mg Oral Daily   apixaban  5 mg Oral BID   Chlorhexidine Gluconate Cloth  6 each Topical Daily   clopidogrel  75 mg Oral q morning   gabapentin  600 mg Oral BID   latanoprost  1 drop Both Eyes QHS   oxyCODONE  10 mg Oral Q12H   pantoprazole  40 mg Oral BID   polyethylene glycol  17 g Oral Daily   rosuvastatin  20 mg Oral Daily   senna-docusate  1 tablet Oral Daily   sodium chloride flush  3 mL Intravenous Q12H   Continuous Infusions:  ceFEPime (MAXIPIME) IV 2 g (09/30/21 2144)   vancomycin 1,500 mg (09/30/21 1620)   PRN Meds: acetaminophen **OR** acetaminophen, albuterol, alum & mag hydroxide-simeth, HYDROcodone-acetaminophen, neomycin-bacitracin-polymyxin, ondansetron **OR** ondansetron (ZOFRAN) IV   Vital Signs    Vitals:   10/01/21 0600 10/01/21 0630 10/01/21 0700 10/01/21 0800  BP: (!) 102/52  (!) 113/52 (!) 90/46  Pulse: 78  77 84  Resp: 16  16 16   Temp:  97.9 F (36.6 C)    TempSrc:  Oral    SpO2: 100%  100% 100%  Weight:      Height:        Intake/Output Summary (Last 24 hours) at 10/01/2021 0838 Last data filed at 10/01/2021 0500 Gross per 24 hour  Intake 1060 ml  Output 2300 ml  Net -1240 ml    Last 3 Weights 10/01/2021 09/30/2021 09/29/2021  Weight (lbs) 274 lb 7.6 oz 276 lb 7.3 oz 270 lb 4.8 oz  Weight (kg) 124.5 kg 125.4 kg 122.607 kg      Telemetry    NSR 70-80s - Personally Reviewed  ECG    No new ECG - Personally Reviewed  Physical Exam   GEN: in no acute distress Neck: No JVD Cardiac: RRR, no murmurs, rubs, or gallops.  Respiratory: Clear to auscultation bilaterally. GI: Soft, nontender, non-distended  MS: No edema; No deformity. Neuro:  Nonfocal   Psych: Normal affect   Labs    High Sensitivity Troponin:   Recent Labs  Lab 09/25/21 0207 09/25/21 0415  TROPONINIHS 13 17      Chemistry Recent Labs  Lab 09/26/21 0301 09/27/21 0139 09/28/21 0320 09/29/21 0251 09/30/21 0151  NA 136 136 135 136 141  K 3.7 4.4 4.6 4.1 4.1  CL 96* 94* 93* 96* 99  CO2 32 32 29 34* 25  GLUCOSE 107* 121* 122* 112* 125*  BUN 26* 39* 43* 42* 42*  CREATININE 1.95* 2.58* 2.91* 2.35* 2.40*  CALCIUM 8.7* 8.5* 8.8* 8.7* 8.8*  MG 2.1 2.2  --   --  2.0  PROT  --   --   --   --  7.1  ALBUMIN  --   --  2.9* 2.8* 2.8*  AST  --   --   --   --  30  ALT  --   --   --   --  21  ALKPHOS  --   --   --   --  64  BILITOT  --   --   --   --  1.0  GFRNONAA 37* 26* 23* 30* 29*  ANIONGAP 8  10 13 6  17*     Lipids No results for input(s): CHOL, TRIG, HDL, LABVLDL, LDLCALC, CHOLHDL in the last 168 hours.  Hematology Recent Labs  Lab 09/24/21 1712 09/25/21 0318 09/29/21 1050 09/30/21 0151  WBC 7.4  --  13.8* 25.6*  RBC 3.67*  --  4.16* 3.77*  HGB 9.8* 12.6* 11.3* 10.0*  HCT 32.5* 37.0* 36.5* 34.1*  MCV 88.6  --  87.7 90.5  MCH 26.7  --  27.2 26.5  MCHC 30.2  --  31.0 29.3*  RDW 15.5  --  14.9 15.3  PLT 222  --  264 216    Thyroid No results for input(s): TSH, FREET4 in the last 168 hours.  BNP Recent Labs  Lab 09/25/21 0207  BNP 566.6*     DDimer No results for input(s): DDIMER in the last 168 hours.   Radiology    DG CHEST PORT 1 VIEW  Result Date: 09/29/2021 CLINICAL DATA:  Shortness of breath, hypoxia. EXAM: PORTABLE CHEST 1 VIEW COMPARISON:  September 24, 2021. FINDINGS: Stable cardiomediastinal silhouette. Mild bibasilar atelectasis or edema is noted with small bilateral pleural effusions. Bony thorax is unremarkable. IMPRESSION: Stable bibasilar atelectasis or edema is noted with associated pleural effusions. Electronically Signed   By: Lupita Raider M.D.   On: 09/29/2021 13:39    Cardiac Studies   Echo 09/27/21:  1. Left  ventricular ejection fraction, by estimation, is 60 to 65%. The  left ventricle has normal function. The left ventricle has no regional  wall motion abnormalities. There is mild left ventricular hypertrophy.  Left ventricular diastolic parameters  were normal.   2. Right ventricular systolic function is normal. The right ventricular  size is normal.   3. Left atrial size was moderately dilated.   4. Right atrial size was moderately dilated.   5. The pericardial effusion is lateral to the left ventricle.   6. The mitral valve is degenerative. No evidence of mitral valve  regurgitation. No evidence of mitral stenosis. Moderate mitral annular  calcification.   7. Post 29 mm Medtronic supra annular Core valve stable gradients since  01/19/21 trivial AR likely PVL . The aortic valve has been  repaired/replaced. Aortic valve regurgitation is trivial. No aortic  stenosis is present.   8. The inferior vena cava is normal in size with greater than 50%  respiratory variability, suggesting right atrial pressure of 3 mmHg.   Patient Profile     65 y.o. male  with a hx of HTN, HFpEF, severe AS s/p TAVR 08/2020, pAF/AFL, DM, CKDIII, R TKA, OSA on CPAP who is being seen 09/26/2021 for the evaluation of decompensated HF  Assessment & Plan    Acute on Chronic Diastolic Heart Failure: Presented initially with volume overload, likely due to stopping Lasix as outpatient.  Chest x-ray consistent with progressive CHF.  BNP 567.  Resolved with IV diuresis but developed worsening renal function, improved with holding diuresis.  On 12/14 began spiking fevers and became hypotensive -Appears euvolemic, continue to hold diuretics.  Can give fluids as needed for hypotension in setting of sepsis  Sepsis: Spiked fever on 12/14.  Procalcitonin elevated.  Normal lactate.  Started on vancomycin/cefepime per primary team.  Urine culture growing Pseudomonas.  Blood cultures NGTD -F/u blood cultures, if cultures positive  will need TEE given prosthetic aortic valve   AKI on CKD: Creatinine 1.6 on admission, which appears baseline, had increased to 2.9, down to 2.35 with holding diuresis.  Continue to hold  diuretics.  Appears euvolemic currently, can give IV fluids if needed if having hypotension in setting of sepssis -No labs this morning, will order   Paroxysmal atrial fibrillation: Currently in sinus rhythm -Continue amiodarone, Eliquis.  Holding metoprolol, diltiazem given soft pressures   Severe aortic stenosis status post TAVR 08/2020: Echo showed normal functioning bioprosthetic valve   Hypertension: BP has been soft.  Holding home antihypertensives    For questions or updates, please contact [place] Please consult [email or web address] for contact info under        Signed, Donato Heinz, MD  10/01/2021, 8:38 AM

## 2021-10-01 NOTE — Progress Notes (Signed)
NAME:  Lawrence Rose, MRN:  [id number], DOB:  [birth year], LOS: 6 ADMISSION DATE:  09/24/2021, CONSULTATION DATE:  09/30/2021 REFERRING MD:  Dr Tawanna Solo, CHIEF COMPLAINT:  hypotension   History of Present Illness:  [name] is a 65 year old male with PMHx of HFpEF, severe aortic stenosis s/p TAVR 08/2020, hypertension, paroxysmal atrial fibrillation/flutter s/p DCCV on chronic anticoagulation, type II diabetes mellitus, CKDIII, OSA on CPAP, right TKA 07/2021 who presented with progressively worsening shortness of breath for several days admitted for acute on chronic heart failure exacerbation after outpatient discontinuation of his lasix by PCP. Patient had been diuresing well during this admission with IV Lasix with 7L off since admission. Lasix discontinued on 12/12 due to worsening renal function with sCr up to 2.9 (baseline sCr 1.5) and patient appearing euvolemic. Throughout the day on 12/14, patient more lethargic and developed fevers with Tmax 104.8 for which patient given tylenol. Noted to have worsening leukocytosis for and intermittent episodes of hypotension; blood cultures, UA, and CXR obtained and patient started on broad spectrum antibiotics. Procal elevated to 1.7. ABG significant for pH 7.32, pCO2 60.4, pO2 51.9 for which patient placed on BiPAP. Overnight continued to have intermittent episodes of hypotension for which started on maintenance fluids. PCCM consulted.   Pertinent  Medical History   Past Medical History:  Diagnosis Date   Acid reflux    Allergic arthritis, hand    per patient, both hands   Anemia    Apnea    Asthma    Carpal tunnel syndrome    per patient, both hands   Chronic back pain    Chronic kidney disease    Chronic knee pain    Diabetes mellitus without complication ([place])    Dysrhythmia    GI bleeding 09/2020   Hypertension    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   s/p TAVR with a 29 mm Medtronic Evolut Pro+ via the TF  approach by Dr. Angelena Form and Dr. Cyndia Bent.    Severe aortic stenosis    Sleep apnea    wears CPAP every night   Significant Hospital Events: Including procedures, antibiotic start and stop dates in addition to other pertinent events   12/10 admitted for acute on chronic hypoxic respiratory failure 2/2 HFpEF exacerbation, diuresis started, cardiology consulted. 12/14 developed fevers to Tmax 104.8 and intermittent episodes of hypotension, started on BiPAP for lethargy and hypercarbia on ABG 12/15 PCCM consulted for ongoing hypotension and fevers, concerns for sepsis   Interim History / Subjective:  [name] denies complaints. No SOB, cuoghing, nausea. Some urinary hesitancy.  Objective   Blood pressure (!) 90/46, pulse 84, temperature 97.9 F (36.6 C), temperature source Oral, resp. rate 16, height 5\' 11"  (1.803 m), weight 124.5 kg, SpO2 100 %.        Intake/Output Summary (Last 24 hours) at 10/01/2021 1155 Last data filed at 10/01/2021 0500 Gross per 24 hour  Intake 820 ml  Output 2300 ml  Net -1480 ml    Filed Weights   09/29/21 0352 09/30/21 0500 10/01/21 0500  Weight: 122.6 kg 125.4 kg 124.5 kg    Examination: General: elderly man sitting up in the chair in NAD HENT: Cucumber/AT, eyes anicteric Lungs: CTAB, no accessory muscle use Cardiovascular: S1S2, RRR Abdomen: soft, NT, ND Extremities: R>L LE edema, no clubbing or cyanosis. Neuro: awake and alert, moving all extremities, answering questions appropriately Skin: warm, dry, no rashes  Urine culture> Psuedomonas, pansensitive Blood> NGTD BUN 39 Cr  2.14 WBC 21.6 H/H 8.6/28.1 CXR 12/14> bibasilar pleural effusions  Resolved Hospital Problem list    Assessment & Plan:  Sepsis due to Pseudomonas UTI with associated AKI & encephalopathy -BP doesn't look far off baseline; hopefully back to the floor soon -con't cefepime to complete 5 days, d/c vanc  -con't following blood cultures until finalized -ensure no urinary  retention  Acute on chronic respiratory failure 2/2 acute HFpEF exacerbation with hypoxia and hypercapnia Placed on BiPAP for lethargy and increased pCO2 on ABG with improvement. Back on [place]. RVP negative. -needs more diuresis when BP allows -titrate FiO2 as needed to maintain SpO2 >90% -pulmonary hygiene  Acute on chronic HFpEF exacerbation Admitted for HFpEF exacerbation; has been diuresed with IV Lasix with ~20L off; net -16L since admission. IV Lasix held since 12/12 due to worsening renal function. Appears euvolemic on exam.  - Cardiology following, appreciate recs - Holding diuretics at this time   Acute metabolic encephalopathy due to sepsis and hypercapnia, resolved -con't antibiotics, supportive care  AKI on CKDIII Baseline sCr ~1.6. Worsened with diuresis. Likely a contributor from infectious process.  -strict I/Os -renally dose meds, avoid nephrotoxic meds -d/c vanc  Acute on chronic normocytic anemia, down-trending -serial CBCs -transfuse for Hb<7 or hemodynamically significant bleeding -monitor for sources of bleeding while on plavix and eliquis; can hold eliquis if bleeding  Hx of PAF  H/o hypertension -Con't eliquis and PO amiodarone. -Con't holding PTA metoprolol, ACE, hydralazine, dilt due to hypotension. -tele monitoring.  CAD  -rosuvastatin, plavix daily -tele monitoring  OSA on CPAP -nightly CPAP  Chronic pain syndrome  -Continue gabapentin. Con't PTA regimen with long-acting oxycodone and short-acting Norco. Verified regimen from neurology on PMP (sees Pain Management in [place]). -Watch renal function; may need to switch off Norco to short-acting oxy if renal function worsens. -bowel regimen  DM -hold PTA metformin -SSI PRN -goal BG 140-180  GERD -con't PTA PPI   Family updated at bedside. Stable to transfer back to the floor in the next 24 hours. Will have TRH reassume care tomorrow.  Best Practice (right click and "Reselect all SmartList  Selections" daily)   Diet/type: Regular consistency (see orders) DVT prophylaxis: DOAC GI prophylaxis: PPI Lines: N/A Foley:  N/A Code Status:  full code Last date of multidisciplinary goals of care discussion [ ]   Labs   CBC: Recent Labs  Lab 09/24/21 1712 09/25/21 0318 09/29/21 1050 09/30/21 0151 10/01/21 0853  WBC 7.4  --  13.8* 25.6* 21.6*  NEUTROABS  --   --   --  20.7*  --   HGB 9.8* 12.6* 11.3* 10.0* 8.6*  HCT 32.5* 37.0* 36.5* 34.1* 28.1*  MCV 88.6  --  87.7 90.5 87.8  PLT 222  --  264 216 216     Basic Metabolic Panel: Recent Labs  Lab 09/26/21 0301 09/27/21 0139 09/28/21 0320 09/29/21 0251 09/30/21 0151 10/01/21 0853  NA 136 136 135 136 141 135  K 3.7 4.4 4.6 4.1 4.1 3.6  CL 96* 94* 93* 96* 99 99  CO2 32 32 29 34* 25 30  GLUCOSE 107* 121* 122* 112* 125* 175*  BUN 26* 39* 43* 42* 42* 39*  CREATININE 1.95* 2.58* 2.91* 2.35* 2.40* 2.14*  CALCIUM 8.7* 8.5* 8.8* 8.7* 8.8* 8.1*  MG 2.1 2.2  --   --  2.0  --   PHOS 4.5  --  4.4 4.0  --  3.1    GFR: Estimated Creatinine Clearance: 45 mL/min (A) (by C-G  formula based on SCr of 2.14 mg/dL (H)).   Julian Hy, DO 10/01/21 12:34 PM [facility]

## 2021-10-01 NOTE — Progress Notes (Signed)
Pharmacy Antibiotic Note  Lawrence Rose is a 65 y.o. male admitted on 09/24/2021 with  shortness of breath .  Pharmacy has been consulted for vancomycin and cefepime dosing.  Now afebrile, WBC still up at 21, Scr 2.14 (CrCl 45 mL/min). Ucx growing >100k pseudomonas aeruginosa (pan-sensitive).   Plan: Stop vancomycin Continue cefepime 2g IV q 12 hours for 5 total days  Monitor renal fx, cx results, clinical pic  Height: 5\' 11"  (180.3 cm) Weight: 124.5 kg (274 lb 7.6 oz) IBW/kg (Calculated) : 75.3  Temp (24hrs), Avg:98.3 F (36.8 C), Min:97.9 F (36.6 C), Max:99.5 F (37.5 C)  Recent Labs  Lab 09/24/21 1712 09/26/21 0301 09/27/21 0139 09/28/21 0320 09/29/21 0251 09/29/21 1050 09/29/21 1314 09/30/21 0151 10/01/21 0853  WBC 7.4  --   --   --   --  13.8*  --  25.6* 21.6*  CREATININE 1.63*   < > 2.58* 2.91* 2.35*  --   --  2.40* 2.14*  LATICACIDVEN  --   --   --   --   --   --  1.6  --   --    < > = values in this interval not displayed.     Estimated Creatinine Clearance: 45 mL/min (A) (by C-G formula based on SCr of 2.14 mg/dL (H)).    Allergies  Allergen Reactions   Cocoa Hives   Sulfamethoxazole-Trimethoprim Anaphylaxis   Chocolate Hives   Citrus Hives    Reaction to oranges and orange juice   Fish Allergy Hives and Swelling    Throat swelling   Shellfish Allergy Hives and Swelling    Throat swelling   Sulfa Antibiotics Hives and Swelling    Throat swelling   Tetracycline Swelling    Antimicrobials this admission: Vancomycin 12/14> 12/16 Cefepime 12/14>  Microbiology results: 12/14 Ucx: pseudomonas (pan-sensitive) 12/14 bldx2: ngtd 12/15 Resp panel: neg    Thank you for allowing pharmacy to be a part of this patients care.  1/16, PharmD, BCCCP Clinical Pharmacist  Phone: [phone] 10/01/2021 12:19 PM  Please check AMION for all [facility] phone numbers After 10:00 PM, call Main Pharmacy [phone]

## 2021-10-02 LAB — GLUCOSE, CAPILLARY
Glucose-Capillary: 138 mg/dL — ABNORMAL HIGH (ref 70–99)
Glucose-Capillary: 187 mg/dL — ABNORMAL HIGH (ref 70–99)
Glucose-Capillary: 169 mg/dL — ABNORMAL HIGH (ref 70–99)
Glucose-Capillary: 185 mg/dL — ABNORMAL HIGH (ref 70–99)

## 2021-10-02 LAB — CBC WITH DIFFERENTIAL/PLATELET
Abs Immature Granulocytes: 0.88 10*3/uL — ABNORMAL HIGH (ref 0.00–0.07)
Basophils Absolute: 0.1 10*3/uL (ref 0.0–0.1)
Basophils Relative: 0 %
Eosinophils Absolute: 0.2 10*3/uL (ref 0.0–0.5)
Eosinophils Relative: 1 %
HCT: 29.1 % — ABNORMAL LOW (ref 39.0–52.0)
Hemoglobin: 8.9 g/dL — ABNORMAL LOW (ref 13.0–17.0)
Immature Granulocytes: 4 %
Lymphocytes Relative: 6 %
Lymphs Abs: 1.2 10*3/uL (ref 0.7–4.0)
MCH: 27.1 pg (ref 26.0–34.0)
MCHC: 30.6 g/dL (ref 30.0–36.0)
MCV: 88.7 fL (ref 80.0–100.0)
Monocytes Absolute: 1.2 10*3/uL — ABNORMAL HIGH (ref 0.1–1.0)
Monocytes Relative: 5 %
Neutro Abs: 18 10*3/uL — ABNORMAL HIGH (ref 1.7–7.7)
Neutrophils Relative %: 84 %
Platelets: 230 10*3/uL (ref 150–400)
RBC: 3.28 MIL/uL — ABNORMAL LOW (ref 4.22–5.81)
RDW: 15.2 % (ref 11.5–15.5)
WBC: 21.5 10*3/uL — ABNORMAL HIGH (ref 4.0–10.5)
nRBC: 0 % (ref 0.0–0.2)

## 2021-10-02 LAB — BASIC METABOLIC PANEL
Anion gap: 7 (ref 5–15)
BUN: 37 mg/dL — ABNORMAL HIGH (ref 8–23)
CO2: 30 mmol/L (ref 22–32)
Calcium: 8.3 mg/dL — ABNORMAL LOW (ref 8.9–10.3)
Chloride: 101 mmol/L (ref 98–111)
Creatinine, Ser: 2.08 mg/dL — ABNORMAL HIGH (ref 0.61–1.24)
GFR, Estimated: 34 mL/min — ABNORMAL LOW (ref >60)
Glucose, Bld: 143 mg/dL — ABNORMAL HIGH (ref 70–99)
Potassium: 3.6 mmol/L (ref 3.5–5.1)
Sodium: 138 mmol/L (ref 135–145)

## 2021-10-02 MED ORDER — POTASSIUM CHLORIDE CRYS ER 20 MEQ PO TBCR
40.0000 meq | EXTENDED_RELEASE_TABLET | Freq: Once | ORAL | Status: AC
  Administered 2021-10-02: 40 meq via ORAL
  Filled 2021-10-02: qty 2

## 2021-10-02 MED ORDER — POLYVINYL ALCOHOL 1.4 % OP SOLN
1.0000 [drp] | OPHTHALMIC | Status: DC | PRN
  Administered 2021-10-03 (×2): 1 [drp] via OPHTHALMIC
  Filled 2021-10-02: qty 15

## 2021-10-02 NOTE — Progress Notes (Signed)
Progress Note  Patient Name: Lawrence Rose Date of Encounter: 10/02/2021  [facility] Cardiologist: Christell Constant, MD   Subjective   BP has improved on maintenance IVF.  Patient notes he feels better but is discouraged by his lack of sleep.  Urine Cultures Pseudomonas, no BCX growth. Asked for O2 overnight because he wasn't given his CPAP.  Inpatient Medications    Scheduled Meds:  amiodarone  200 mg Oral Daily   apixaban  5 mg Oral BID   Chlorhexidine Gluconate Cloth  6 each Topical Daily   clopidogrel  75 mg Oral q morning   gabapentin  600 mg Oral BID   insulin aspart  0-9 Units Subcutaneous TID WC   latanoprost  1 drop Both Eyes QHS   oxyCODONE  10 mg Oral Q12H   pantoprazole  40 mg Oral BID   polyethylene glycol  17 g Oral Daily   rosuvastatin  20 mg Oral Daily   senna-docusate  1 tablet Oral Daily   sodium chloride flush  3 mL Intravenous Q12H   Continuous Infusions:  ceFEPime (MAXIPIME) IV 2 g (10/01/21 2156)   PRN Meds: acetaminophen **OR** acetaminophen, albuterol, alum & mag hydroxide-simeth, HYDROcodone-acetaminophen, neomycin-bacitracin-polymyxin, ondansetron **OR** ondansetron (ZOFRAN) IV   Vital Signs    Vitals:   10/01/21 2200 10/01/21 2306 10/01/21 2311 10/02/21 0417  BP: (!) 103/55 (!) 127/56  (!) 126/53  Pulse: 72 80  88  Resp: 18 20  17   Temp:  99.6 F (37.6 C)  99 F (37.2 C)  TempSrc:  Oral  Oral  SpO2: 100% 98%  92%  Weight:   125.5 kg   Height:   5\' 11"  (1.803 m)     Intake/Output Summary (Last 24 hours) at 10/02/2021 0817 Last data filed at 10/02/2021 0500 Gross per 24 hour  Intake 360 ml  Output 1950 ml  Net -1590 ml   Last 3 Weights 10/01/2021 10/01/2021 09/30/2021  Weight (lbs) 276 lb 9.6 oz 274 lb 7.6 oz 276 lb 7.3 oz  Weight (kg) 125.465 kg 124.5 kg 125.4 kg      Telemetry    SR - Personally Reviewed  ECG    No new ECG - Personally Reviewed  Physical Exam   Gen: no distress, morbid obesity   Neck:  No JVD Cardiac: No Rubs or Gallops, no Murmur, regular rhythm +2 radial pulses Respiratory: Clear to auscultation bilaterally, normal effort, normal  respiratory rate GI: Soft, nontender distended with dullness to percussion MS: +1 pitting edema R leg;  moves all extremities Integument: Skin feels warm Neuro:  At time of evaluation, alert and oriented to person/place/time/situation  Psych: Normal affect, patient feels OK   Labs    High Sensitivity Troponin:   Recent Labs  Lab 09/25/21 0207 09/25/21 0415  TROPONINIHS 13 17     Chemistry Recent Labs  Lab 09/26/21 0301 09/27/21 0139 09/28/21 0320 09/29/21 0251 09/30/21 0151 10/01/21 0853 10/02/21 0215  NA 136 136   < > 136 141 135 138  K 3.7 4.4   < > 4.1 4.1 3.6 3.6  CL 96* 94*   < > 96* 99 99 101  CO2 32 32   < > 34* 25 30 30   GLUCOSE 107* 121*   < > 112* 125* 175* 143*  BUN 26* 39*   < > 42* 42* 39* 37*  CREATININE 1.95* 2.58*   < > 2.35* 2.40* 2.14* 2.08*  CALCIUM 8.7* 8.5*   < > 8.7* 8.8*  8.1* 8.3*  MG 2.1 2.2  --   --  2.0  --   --   PROT  --   --   --   --  7.1  --   --   ALBUMIN  --   --    < > 2.8* 2.8* 2.3*  --   AST  --   --   --   --  30  --   --   ALT  --   --   --   --  21  --   --   ALKPHOS  --   --   --   --  64  --   --   BILITOT  --   --   --   --  1.0  --   --   GFRNONAA 37* 26*   < > 30* 29* 33* 34*  ANIONGAP 8 10   < > 6 17* 6 7   < > = values in this interval not displayed.    Lipids No results for input(s): CHOL, TRIG, HDL, LABVLDL, LDLCALC, CHOLHDL in the last 168 hours.  Hematology Recent Labs  Lab 09/29/21 1050 09/30/21 0151 10/01/21 0853  WBC 13.8* 25.6* 21.6*  RBC 4.16* 3.77* 3.20*  HGB 11.3* 10.0* 8.6*  HCT 36.5* 34.1* 28.1*  MCV 87.7 90.5 87.8  MCH 27.2 26.5 26.9  MCHC 31.0 29.3* 30.6  RDW 14.9 15.3 15.3  PLT 264 216 216   Thyroid No results for input(s): TSH, FREET4 in the last 168 hours.  BNP No results for input(s): BNP, PROBNP in the last 168 hours.  DDimer No results  for input(s): DDIMER in the last 168 hours.   Radiology    No results found.  Cardiac Studies   Echo 09/27/21:  1. Left ventricular ejection fraction, by estimation, is 60 to 65%. The  left ventricle has normal function. The left ventricle has no regional  wall motion abnormalities. There is mild left ventricular hypertrophy.  Left ventricular diastolic parameters  were normal.   2. Right ventricular systolic function is normal. The right ventricular  size is normal.   3. Left atrial size was moderately dilated.   4. Right atrial size was moderately dilated.   5. The pericardial effusion is lateral to the left ventricle.   6. The mitral valve is degenerative. No evidence of mitral valve  regurgitation. No evidence of mitral stenosis. Moderate mitral annular  calcification.   7. Post 29 mm Medtronic supra annular Core valve stable gradients since  01/19/21 trivial AR likely PVL . The aortic valve has been  repaired/replaced. Aortic valve regurgitation is trivial. No aortic  stenosis is present.   8. The inferior vena cava is normal in size with greater than 50%  respiratory variability, suggesting right atrial pressure of 3 mmHg.   Patient Profile     65 y.o. male  with a hx of HTN, HFpEF, severe AS s/p TAVR 08/2020, pAF/AFL, DM, CKDIII, R TKA, OSA on CPAP who is being seen 09/26/2021 for the evaluation of decompensated HF  Assessment & Plan    Acute on Chronic Diastolic Heart Failure:  - euvolemic with diuretics, over diuresis with AKI, complicated by sepsis - hold diuretics but will need outpatient diuretic  OSA - I have weaned back of O2 this AM - I have ordered nocturnal CPAP  Sepsis secondary to pseudomonas UTI Blood cultures NGTD - ABX per primary   AKI on CKD:  - Creatinine  1.6 on admission, which appears baseline, had increased to 2.9 continuing to down trend Lasix as above   Paroxysmal atrial fibrillation:  HTN -Continue amiodarone, Eliquis. - Holding  metoprolol, diltiazem given hypotension (improving)   Severe aortic stenosis status post TAVR 08/2020: Echo showed normal functioning bioprosthetic valve   Barrires to DC appear to be hypotension and sepsis, when these resolves will start PO diurtic plan and arrange early follow up   For questions or updates, please contact [facility] Please consult [email or web address] for contact info under        Signed, Werner Lean, MD  10/02/2021, 8:17 AM

## 2021-10-02 NOTE — Progress Notes (Signed)
PROGRESS NOTE    Lawrence Rose  ZOX:[id number] DOB: November 04, 1955 DOA: 09/24/2021 PCP: Lenox Ponds, MD   Chief Complaint  Patient presents with   Shortness of Breath    Brief Narrative:  [name] is a 65 year old male with PMHx of HFpEF, severe aortic stenosis s/p TAVR 08/2020, hypertension, paroxysmal atrial fibrillation/flutter s/p DCCV on chronic anticoagulation, type II diabetes mellitus, CKDIII, OSA on CPAP, right TKA 07/2021 who presented with progressively worsening shortness of breath for several days admitted for acute on chronic heart failure exacerbation after outpatient discontinuation of his lasix by PCP. Patient had been diuresing well during this admission with IV Lasix with 7L off since admission. Lasix discontinued on 12/12 due to worsening renal function with sCr up to 2.9 (baseline sCr 1.5) and patient appearing euvolemic. Throughout the day on 12/14, patient more lethargic and developed fevers with Tmax 104.8 for which patient given tylenol.  PCCM consulted for sepsis and hypotension.   12/10 admitted for acute on chronic hypoxic respiratory failure 2/2 HFpEF exacerbation, diuresis started, cardiology consulted. 12/14 developed fevers to Tmax 104.8 and intermittent episodes of hypotension, started on BiPAP for lethargy and hypercarbia on ABG 12/15 PCCM consulted for ongoing hypotension and fevers, concerns for sepsis   pt transferred to [facility] service on 10/02/21.   Assessment & Plan:   Principal Problem:   CHF exacerbation (HCC) Active Problems:   Sepsis (HCC)   OSA on CPAP   Severe aortic stenosis   AKI (acute kidney injury) (HCC)   Acute on chronic diastolic (congestive) heart failure (HCC)   Normocytic anemia   S/P TAVR (transcatheter aortic valve replacement)   Chronic anticoagulation   Diabetes mellitus type 2 in obese (HCC)   PAF (paroxysmal atrial fibrillation) (HCC)   Respiratory failure with hypoxia (HCC)   Chest pain   Chronic pain  syndrome   CHF (congestive heart failure) (HCC)   Acute on chronic respiratory failure with hypoxia and hypercapnia secondary to acute diastolic heart failure Recommend to continue with CPAP at nights and nasal cannula oxygen during the day Cardiology on board for acute diastolic heart failure currently diuresis on hold due to borderline blood pressures.   Sepsis secondary to Pseudomonas urinary tract infection with associated AKI and acute metabolic encephalopathy Complete the course of cefepime as per PCCM recommendations. Sepsis physiology has improved and blood pressure parameters have improved   Acute metabolic encephalopathy secondary to hypercapnia and sepsis Continue with CPAP at night and complete the course of antibiotics. Patient is alert and oriented and appears to be back to baseline   Acute kidney injury on stage IIIa CKD Baseline creatinine around 1.6 Avoid nephrotoxic medications and vancomycin was already discontinued. Continue to monitor creatinine.  Anemia of chronic disease Transfuse to keep hemoglobin greater than 7.   History of paroxysmal atrial fibrillation Rate control with amiodarone and on Eliquis for anticoagulation.    Obstructive sleep apnea on CPAP   Chronic pain syndrome Continue with gabapentin   History of coronary artery disease Patient currently denies any chest pain or shortness of breath Continue with Plavix and statin.    Diabetes mellitus type 2 Continue with sliding scale insulin while inpatient.   GERD Stable.  Body mass index is 38.58 kg/m. Obesity increased risk of mortality and morbidity.    Therapy evaluations are pending.     DVT prophylaxis: (Eliquis) Code Status: Full code Family Communication: none at bedside. Disposition:   Status is: Inpatient  Remains inpatient appropriate because:  IV antibiotics.        Consultants:  Cardiology PCCM  Procedures: none.   Antimicrobials:  Antibiotics  Given (last 72 hours)     Date/Time Action Medication Dose Rate   09/29/21 1238 New Bag/Given   vancomycin (VANCOREADY) IVPB 2000 mg/400 mL 2,000 mg 200 mL/hr   09/29/21 1453 New Bag/Given   ceFEPIme (MAXIPIME) 2 g in sodium chloride 0.9 % 100 mL IVPB 2 g 200 mL/hr   09/29/21 2238 New Bag/Given   ceFEPIme (MAXIPIME) 2 g in sodium chloride 0.9 % 100 mL IVPB 2 g 200 mL/hr   09/30/21 1200 New Bag/Given   ceFEPIme (MAXIPIME) 2 g in sodium chloride 0.9 % 100 mL IVPB 2 g 200 mL/hr   09/30/21 1620 New Bag/Given   vancomycin (VANCOREADY) IVPB 1500 mg/300 mL 1,500 mg 150 mL/hr   09/30/21 2144 New Bag/Given   ceFEPIme (MAXIPIME) 2 g in sodium chloride 0.9 % 100 mL IVPB 2 g 200 mL/hr   10/01/21 0943 New Bag/Given   ceFEPIme (MAXIPIME) 2 g in sodium chloride 0.9 % 100 mL IVPB 2 g 200 mL/hr   10/01/21 2156 New Bag/Given   ceFEPIme (MAXIPIME) 2 g in sodium chloride 0.9 % 100 mL IVPB 2 g 200 mL/hr   10/02/21 [id number] New Bag/Given   ceFEPIme (MAXIPIME) 2 g in sodium chloride 0.9 % 100 mL IVPB 2 g 200 mL/hr         Subjective: No new complaints.   Objective: Vitals:   10/01/21 2306 10/01/21 2311 10/02/21 0417 10/02/21 0900  BP: (!) 127/56  (!) 126/53 (!) 114/47  Pulse: 80  88   Resp: 20  17   Temp: 99.6 F (37.6 C)  99 F (37.2 C)   TempSrc: Oral  Oral   SpO2: 98%  92% 93%  Weight:  125.5 kg    Height:  5\' 11"  (1.803 m)      Intake/Output Summary (Last 24 hours) at 10/02/2021 1057 Last data filed at 10/02/2021 0900 Gross per 24 hour  Intake 840 ml  Output 1950 ml  Net -1110 ml   Filed Weights   09/30/21 0500 10/01/21 0500 10/01/21 2311  Weight: 125.4 kg 124.5 kg 125.5 kg    Examination:  General exam: Appears calm and comfortable  Respiratory system: Clear to auscultation. Respiratory effort normal. Cardiovascular system: S1 & S2 heard, RRR. No JVD, No pedal edema. Gastrointestinal system: Abdomen is nondistended, soft and nontender. . Normal bowel sounds heard. Central  nervous system: Alert and oriented. No focal neurological deficits. Extremities: Symmetric 5 x 5 power. Skin: No rashes, lesions or ulcers Psychiatry: Mood & affect appropriate.     Data Reviewed: I have personally reviewed following labs and imaging studies  CBC: Recent Labs  Lab 09/29/21 1050 09/30/21 0151 10/01/21 0853 10/02/21 0215  WBC 13.8* 25.6* 21.6* 21.5*  NEUTROABS  --  20.7*  --  18.0*  HGB 11.3* 10.0* 8.6* 8.9*  HCT 36.5* 34.1* 28.1* 29.1*  MCV 87.7 90.5 87.8 88.7  PLT 264 216 216 [id number]    Basic Metabolic Panel: Recent Labs  Lab 09/26/21 0301 09/27/21 0139 09/28/21 0320 09/29/21 0251 09/30/21 0151 10/01/21 0853 10/02/21 0215  NA 136 136 135 136 141 135 138  K 3.7 4.4 4.6 4.1 4.1 3.6 3.6  CL 96* 94* 93* 96* 99 99 101  CO2 32 32 29 34* 25 30 30   GLUCOSE 107* 121* 122* 112* 125* 175* 143*  BUN 26* 39* 43* 42* 42* 39* 37*  CREATININE 1.95* 2.58* 2.91* 2.35* 2.40* 2.14* 2.08*  CALCIUM 8.7* 8.5* 8.8* 8.7* 8.8* 8.1* 8.3*  MG 2.1 2.2  --   --  2.0  --   --   PHOS 4.5  --  4.4 4.0  --  3.1  --     GFR: Estimated Creatinine Clearance: 46.5 mL/min (A) (by C-G formula based on SCr of 2.08 mg/dL (H)).  Liver Function Tests: Recent Labs  Lab 09/28/21 0320 09/29/21 0251 09/30/21 0151 10/01/21 0853  AST  --   --  30  --   ALT  --   --  21  --   ALKPHOS  --   --  64  --   BILITOT  --   --  1.0  --   PROT  --   --  7.1  --   ALBUMIN 2.9* 2.8* 2.8* 2.3*    CBG: Recent Labs  Lab 10/01/21 0605 10/01/21 1126 10/01/21 1623 10/01/21 2004 10/02/21 0601  GLUCAP 114* 151* 146* 250* 138*     Recent Results (from the past 240 hour(s))  Resp Panel by RT-PCR (Flu A&B, Covid) Nasopharyngeal Swab     Status: None   Collection Time: 09/25/21  2:51 AM   Specimen: Nasopharyngeal Swab; Nasopharyngeal(NP) swabs in vial transport medium  Result Value Ref Range Status   SARS Coronavirus 2 by RT PCR NEGATIVE NEGATIVE Final    Comment: (NOTE) SARS-CoV-2 target  nucleic acids are NOT DETECTED.  The SARS-CoV-2 RNA is generally detectable in upper respiratory specimens during the acute phase of infection. The lowest concentration of SARS-CoV-2 viral copies this assay can detect is 138 copies/mL. A negative result does not preclude SARS-Cov-2 infection and should not be used as the sole basis for treatment or other patient management decisions. A negative result may occur with  improper specimen collection/handling, submission of specimen other than nasopharyngeal swab, presence of viral mutation(s) within the areas targeted by this assay, and inadequate number of viral copies(<138 copies/mL). A negative result must be combined with clinical observations, patient history, and epidemiological information. The expected result is Negative.  Fact Sheet for Patients:  EntrepreneurPulse.com.au  Fact Sheet for Healthcare Providers:  IncredibleEmployment.be  This test is no t yet approved or cleared by the [place] FDA and  has been authorized for detection and/or diagnosis of SARS-CoV-2 by FDA under an Emergency Use Authorization (EUA). This EUA will remain  in effect (meaning this test can be used) for the duration of the COVID-19 declaration under Section 564(b)(1) of the Act, 21 U.S.C.section 360bbb-3(b)(1), unless the authorization is terminated  or revoked sooner.       Influenza A by PCR NEGATIVE NEGATIVE Final   Influenza B by PCR NEGATIVE NEGATIVE Final    Comment: (NOTE) The Xpert Xpress SARS-CoV-2/FLU/RSV plus assay is intended as an aid in the diagnosis of influenza from Nasopharyngeal swab specimens and should not be used as a sole basis for treatment. Nasal washings and aspirates are unacceptable for Xpert Xpress SARS-CoV-2/FLU/RSV testing.  Fact Sheet for Patients: EntrepreneurPulse.com.au  Fact Sheet for Healthcare  Providers: IncredibleEmployment.be  This test is not yet approved or cleared by the [place] FDA and has been authorized for detection and/or diagnosis of SARS-CoV-2 by FDA under an Emergency Use Authorization (EUA). This EUA will remain in effect (meaning this test can be used) for the duration of the COVID-19 declaration under Section 564(b)(1) of the Act, 21 U.S.C. section 360bbb-3(b)(1), unless the authorization is terminated or revoked.  Performed at [facility], [place] [address] [place], [place] [address]   Culture, blood (routine x 2)     Status: None (Preliminary result)   Collection Time: 09/29/21 11:38 AM   Specimen: Left Antecubital; Blood  Result Value Ref Range Status   Specimen Description LEFT ANTECUBITAL  Final   Special Requests   Final    BOTTLES DRAWN AEROBIC AND ANAEROBIC Blood Culture adequate volume   Culture   Final    NO GROWTH 2 DAYS Performed at [facility], [place] [address] [place], [place] [address]    Report Status PENDING  Incomplete  Culture, blood (routine x 2)     Status: None (Preliminary result)   Collection Time: 09/29/21 11:47 AM   Specimen: Right Antecubital; Blood  Result Value Ref Range Status   Specimen Description RIGHT ANTECUBITAL  Final   Special Requests   Final    BOTTLES DRAWN AEROBIC AND ANAEROBIC Blood Culture adequate volume   Culture   Final    NO GROWTH 2 DAYS Performed at [facility], [place] [address], [place], [place] [address]    Report Status PENDING  Incomplete  Urine Culture     Status: Abnormal   Collection Time: 09/29/21  2:56 PM   Specimen: Urine, Clean Catch  Result Value Ref Range Status   Specimen Description URINE, CLEAN CATCH  Final   Special Requests   Final    NONE Performed at [facility], [place] [address], [place], [place] [address]    Culture >=100,000 COLONIES/mL PSEUDOMONAS AERUGINOSA (A)  Final   Report Status 10/01/2021 FINAL  Final   Organism ID,  Bacteria PSEUDOMONAS AERUGINOSA (A)  Final      Susceptibility   Pseudomonas aeruginosa - MIC*    CEFTAZIDIME 4 SENSITIVE Sensitive     CIPROFLOXACIN <=0.25 SENSITIVE Sensitive     GENTAMICIN <=1 SENSITIVE Sensitive     IMIPENEM 1 SENSITIVE Sensitive     PIP/TAZO 8 SENSITIVE Sensitive     CEFEPIME 2 SENSITIVE Sensitive     * >=100,000 COLONIES/mL PSEUDOMONAS AERUGINOSA  Respiratory (~20 pathogens) panel by PCR     Status: None   Collection Time: 09/30/21 10:35 AM   Specimen: Nasopharyngeal Swab; Respiratory  Result Value Ref Range Status   Adenovirus NOT DETECTED NOT DETECTED Final   Coronavirus 229E NOT DETECTED NOT DETECTED Final    Comment: (NOTE) The Coronavirus on the Respiratory Panel, DOES NOT test for the novel  Coronavirus (2019 nCoV)    Coronavirus HKU1 NOT DETECTED NOT DETECTED Final   Coronavirus NL63 NOT DETECTED NOT DETECTED Final   Coronavirus OC43 NOT DETECTED NOT DETECTED Final   Metapneumovirus NOT DETECTED NOT DETECTED Final   Rhinovirus / Enterovirus NOT DETECTED NOT DETECTED Final   Influenza A NOT DETECTED NOT DETECTED Final   Influenza B NOT DETECTED NOT DETECTED Final   Parainfluenza Virus 1 NOT DETECTED NOT DETECTED Final   Parainfluenza Virus 2 NOT DETECTED NOT DETECTED Final   Parainfluenza Virus 3 NOT DETECTED NOT DETECTED Final   Parainfluenza Virus 4 NOT DETECTED NOT DETECTED Final   Respiratory Syncytial Virus NOT DETECTED NOT DETECTED Final   Bordetella pertussis NOT DETECTED NOT DETECTED Final   Bordetella Parapertussis NOT DETECTED NOT DETECTED Final   Chlamydophila pneumoniae NOT DETECTED NOT DETECTED Final   Mycoplasma pneumoniae NOT DETECTED NOT DETECTED Final    Comment: Performed at [facility], [place]. [address], [place], [place] [address]  MRSA Next Gen by PCR, Nasal     Status: None   Collection Time: 09/30/21 10:02 PM   Specimen: Nasal Mucosa; Nasal Swab  Result Value Ref Range Status   MRSA by PCR Next Gen NOT DETECTED NOT  DETECTED Final    Comment: (NOTE) The GeneXpert MRSA Assay (FDA approved for NASAL specimens only), is one component of a comprehensive MRSA colonization surveillance program. It is not intended to diagnose MRSA infection nor to guide or monitor treatment for MRSA infections. Test performance is not FDA approved in patients less than 85 years old. Performed at [facility], [place] [address], [place], [place] [address]          Radiology Studies: No results found.      Scheduled Meds:  amiodarone  200 mg Oral Daily   apixaban  5 mg Oral BID   Chlorhexidine Gluconate Cloth  6 each Topical Daily   clopidogrel  75 mg Oral q morning   gabapentin  600 mg Oral BID   insulin aspart  0-9 Units Subcutaneous TID WC   latanoprost  1 drop Both Eyes QHS   oxyCODONE  10 mg Oral Q12H   pantoprazole  40 mg Oral BID   polyethylene glycol  17 g Oral Daily   rosuvastatin  20 mg Oral Daily   senna-docusate  1 tablet Oral Daily   sodium chloride flush  3 mL Intravenous Q12H   Continuous Infusions:  ceFEPime (MAXIPIME) IV 2 g (10/02/21 [id number])     LOS: 7 days      Hosie Poisson, MD [facility]   To contact the attending provider between 7A-7P or the covering provider during after hours 7P-7A, please log into the web site [email or web address] and access using universal [place] password for that web site. If you do not have the password, please call the hospital operator.  10/02/2021, 10:57 AM

## 2021-10-02 NOTE — Progress Notes (Signed)
Mobility Specialist Progress Note:   10/02/21 1409  Mobility  Activity Transferred to/from [facility]  Level of Assistance Standby assist, set-up cues, supervision of patient - no hands on  Assistive Device None  Distance Ambulated (ft) 4 ft  Mobility Out of bed for toileting;Out of bed to chair with meals  Mobility Response Tolerated well  Mobility performed by Mobility specialist  Bed Position Chair  $Mobility charge 1 Mobility   [facility] Lawrence Rose Mobility Specialist Primary Phone [phone] Secondary Phone [phone]

## 2021-10-03 LAB — GLUCOSE, CAPILLARY
Glucose-Capillary: 150 mg/dL — ABNORMAL HIGH (ref 70–99)
Glucose-Capillary: 156 mg/dL — ABNORMAL HIGH (ref 70–99)
Glucose-Capillary: 228 mg/dL — ABNORMAL HIGH (ref 70–99)
Glucose-Capillary: 143 mg/dL — ABNORMAL HIGH (ref 70–99)

## 2021-10-03 LAB — CBC
HCT: 29.6 % — ABNORMAL LOW (ref 39.0–52.0)
Hemoglobin: 8.8 g/dL — ABNORMAL LOW (ref 13.0–17.0)
MCH: 26.5 pg (ref 26.0–34.0)
MCHC: 29.7 g/dL — ABNORMAL LOW (ref 30.0–36.0)
MCV: 89.2 fL (ref 80.0–100.0)
Platelets: 253 10*3/uL (ref 150–400)
RBC: 3.32 MIL/uL — ABNORMAL LOW (ref 4.22–5.81)
RDW: 15.3 % (ref 11.5–15.5)
WBC: 19.1 10*3/uL — ABNORMAL HIGH (ref 4.0–10.5)
nRBC: 0 % (ref 0.0–0.2)

## 2021-10-03 LAB — BASIC METABOLIC PANEL
Anion gap: 7 (ref 5–15)
BUN: 25 mg/dL — ABNORMAL HIGH (ref 8–23)
CO2: 30 mmol/L (ref 22–32)
Calcium: 9.2 mg/dL (ref 8.9–10.3)
Chloride: 104 mmol/L (ref 98–111)
Creatinine, Ser: 1.83 mg/dL — ABNORMAL HIGH (ref 0.61–1.24)
GFR, Estimated: 40 mL/min — ABNORMAL LOW (ref >60)
Glucose, Bld: 123 mg/dL — ABNORMAL HIGH (ref 70–99)
Potassium: 4.5 mmol/L (ref 3.5–5.1)
Sodium: 141 mmol/L (ref 135–145)

## 2021-10-03 LAB — MAGNESIUM: Magnesium: 2.3 mg/dL (ref 1.7–2.4)

## 2021-10-03 MED ORDER — BISACODYL 10 MG RE SUPP: 10.0000 mg | Freq: Every day | RECTAL | Status: DC | PRN

## 2021-10-03 MED ORDER — BISACODYL 10 MG RE SUPP
10.0000 mg | Freq: Once | RECTAL | Status: AC
  Administered 2021-10-03: 12:00:00 10 mg via RECTAL
  Filled 2021-10-03: qty 1

## 2021-10-03 NOTE — Evaluation (Signed)
Physical Therapy Evaluation Patient Details Name: Lawrence Rose MRN: [id number] DOB: Feb 17, 1956 Today's Date: 10/03/2021  History of Present Illness  [name] is a 65 year old male with PMHx of HFpEF, severe aortic stenosis s/p TAVR 08/2020, hypertension, paroxysmal atrial fibrillation/flutter s/p DCCV on chronic anticoagulation, type II diabetes mellitus, CKDIII, OSA on CPAP, right TKA 07/2021 who presented with progressively worsening shortness of breath for several days admitted for acute on chronic heart failure exacerbation after outpatient discontinuation of his lasix by PCP.  Clinical Impression   Pt admitted with above diagnosis. Comes from homw where he lives alone, family live nearby and can assist prn (brother helps him with car rides); Presents to PT with generalized weakness, and decr activity tolerance; still, able to walk in the  hallways, and showing good self-monitor for activity tolerance; Anticipate will be able to dc home -- possibly tomorrow;  Pt currently with functional limitations due to the deficits listed below (see PT Problem List). Pt will benefit from skilled PT to increase their independence and safety with mobility to allow discharge to the venue listed below.          Recommendations for follow up therapy are one component of a multi-disciplinary discharge planning process, led by the attending physician.  Recommendations may be updated based on patient status, additional functional criteria and insurance authorization.  Follow Up Recommendations Home health PT (Recommend cardiac REhab phase 2 after [place] course; Pt is curious about how much insurance covers cardiac rehab)    Assistance Recommended at Discharge None  Functional Status Assessment Patient has had a recent decline in their functional status and demonstrates the ability to make significant improvements in function in a reasonable and predictable amount of time.  Equipment Recommendations        Recommendations for Other Services       Precautions / Restrictions Precautions Precautions: Fall      Mobility  Bed Mobility Overal bed mobility: Modified Independent                  Transfers Overall transfer level: Modified independent Equipment used: Rolling walker (2 wheels)                    Ambulation/Gait Ambulation/Gait assistance: Min guard Gait Distance (Feet): 250 Feet Assistive device: Rolling walker (2 wheels) Gait Pattern/deviations: Step-through pattern Gait velocity: slowed     General Gait Details: Cues to self-monitro for activity tolerance  Stairs            Wheelchair Mobility    Modified Rankin (Stroke Patients Only)       Balance Overall balance assessment: Mild deficits observed, not formally tested                                           Pertinent Vitals/Pain      Home Living Family/patient expects to be discharged to:: Private residence Living Arrangements: Alone Available Help at Discharge: Family;Available PRN/intermittently (live nearby) Type of Home: House Home Access: Stairs to enter Entrance Stairs-Rails: Psychiatric nurse of Steps: 4   Home Layout: Two level;Able to live on main level with bedroom/bathroom Home Equipment: Rolling Walker (2 wheels);Cane - single point      Prior Function Prior Level of Function : Independent/Modified Independent             Mobility Comments: Assistvie device  for amb prn       Hand Dominance        Extremity/Trunk Assessment   Upper Extremity Assessment Upper Extremity Assessment: Overall WFL for tasks assessed    Lower Extremity Assessment Lower Extremity Assessment: Generalized weakness       Communication   Communication: No difficulties  Cognition Arousal/Alertness: Awake/alert Behavior During Therapy: WFL for tasks assessed/performed Overall Cognitive Status: Within Functional Limits for tasks  assessed                                          General Comments General comments (skin integrity, edema, etc.): Walked on Room Air and O2 sats remained greater than or equal to 94%    Exercises     Assessment/Plan    PT Assessment Patient needs continued PT services  PT Problem List Decreased strength;Decreased activity tolerance;Decreased mobility;Decreased knowledge of use of DME;Decreased knowledge of precautions;Cardiopulmonary status limiting activity       PT Treatment Interventions DME instruction;Gait training;Stair training;Functional mobility training;Therapeutic activities;Therapeutic exercise;Balance training;Patient/family education    PT Goals (Current goals can be found in the Care Plan section)  Acute Rehab PT Goals Patient Stated Goal: HOme soon PT Goal Formulation: With patient Time For Goal Achievement: 10/10/21 Potential to Achieve Goals: Good    Frequency Min 3X/week   Barriers to discharge        Co-evaluation               AM-PAC PT "6 Clicks" Mobility  Outcome Measure Help needed turning from your back to your side while in a flat bed without using bedrails?: None Help needed moving from lying on your back to sitting on the side of a flat bed without using bedrails?: None Help needed moving to and from a bed to a chair (including a wheelchair)?: None Help needed standing up from a chair using your arms (e.g., wheelchair or bedside chair)?: A Little Help needed to walk in hospital room?: A Little Help needed climbing 3-5 steps with a railing? : A Little 6 Click Score: 21    End of Session   Activity Tolerance: Patient tolerated treatment well Patient left: in bed;with call bell/phone within reach;with family/visitor present Nurse Communication: Mobility status PT Visit Diagnosis: Unsteadiness on feet (R26.81);Other abnormalities of gait and mobility (R26.89)    Time: 0174-9449 PT Time Calculation (min) (ACUTE ONLY):  42 min   Charges:   PT Evaluation $PT Eval Low Complexity: 1 Low PT Treatments $Gait Training: 23-37 mins        Lawrence Rose, [place]  Acute Rehabilitation Services Pager [phone] Office [phone]   Levi Aland 10/03/2021, 5:54 PM

## 2021-10-03 NOTE — Progress Notes (Signed)
PROGRESS NOTE    Lawrence Rose  [id number] DOB: 1956-07-22 DOA: 09/24/2021 PCP: Doreatha Lew, MD   Chief Complaint  Patient presents with   Shortness of Breath    Brief Narrative:  [name] is a 65 year old male with PMHx of HFpEF, severe aortic stenosis s/p TAVR 08/2020, hypertension, paroxysmal atrial fibrillation/flutter s/p DCCV on chronic anticoagulation, type II diabetes mellitus, CKDIII, OSA on CPAP, right TKA 07/2021 who presented with progressively worsening shortness of breath for several days admitted for acute on chronic heart failure exacerbation after outpatient discontinuation of his lasix by PCP. Patient had been diuresing well during this admission with IV Lasix with 7L off since admission. Lasix discontinued on 12/12 due to worsening renal function with sCr up to 2.9 (baseline sCr 1.5) and patient appearing euvolemic. Throughout the day on 12/14, patient more lethargic and developed fevers with Tmax 104.8 for which patient given tylenol.  PCCM consulted for sepsis and hypotension.   12/10 admitted for acute on chronic hypoxic respiratory failure 2/2 HFpEF exacerbation, diuresis started, cardiology consulted. 12/14 developed fevers to Tmax 104.8 and intermittent episodes of hypotension, started on BiPAP for lethargy and hypercarbia on ABG 12/15 PCCM consulted for ongoing hypotension and fevers, concerns for sepsis   pt transferred to [facility] service on 10/02/21.   Assessment & Plan:   Principal Problem:   CHF exacerbation ([place]) Active Problems:   Sepsis ([place])   OSA on CPAP   Severe aortic stenosis   AKI (acute kidney injury) ([place])   Acute on chronic diastolic (congestive) heart failure (HCC)   Normocytic anemia   S/P TAVR (transcatheter aortic valve replacement)   Chronic anticoagulation   Diabetes mellitus type 2 in obese (HCC)   PAF (paroxysmal atrial fibrillation) (HCC)   Respiratory failure with hypoxia (HCC)   Chest pain   Chronic pain  syndrome   CHF (congestive heart failure) (HCC)   Acute on chronic respiratory failure with hypoxia and hypercapnia secondary to acute diastolic heart failure Recommend to continue with CPAP at nights and nasal cannula oxygen during the day Cardiology on board for acute diastolic heart failure, currently diuresis on hold, due to borderline blood pressures. BP parameters have improved.  Will probably need to be started on oral lasix 40 mg daily as outpatient.    Sepsis secondary to Pseudomonas urinary tract infection with associated AKI and acute metabolic encephalopathy Complete the course of cefepime as per PCCM recommendations. Sepsis physiology has improved and blood pressure parameters have improved. Complete the course of cefepime today and plan for discharge in the morning.  Afebrile, improving leukocytosis.    Acute metabolic encephalopathy secondary to hypercapnia and sepsis Continue with CPAP at night and complete the course of antibiotics. Patient is alert and oriented and appears to be back to baseline. No issues.    Acute kidney injury on stage IIIa CKD Baseline creatinine around 1.6 Avoid nephrotoxic medications and vancomycin was already discontinued. Creatinine has improved to 1.83.   Anemia of chronic disease Transfuse to keep hemoglobin greater than 7. Hemoglobin is stable around 8.    History of paroxysmal atrial fibrillation Rate control with amiodarone and on Eliquis for anticoagulation.   Obstructive sleep apnea on CPAP   Chronic pain syndrome Continue with gabapentin   History of coronary artery disease Patient currently denies any chest pain or shortness of breath Continue with Plavix and statin.    Diabetes mellitus type 2 Continue with sliding scale insulin while inpatient. CBG (last 3)  Recent Labs    10/02/21 2103 10/03/21 0504 10/03/21 1146  GLUCAP 185* 150* 156*      GERD Stable.  Body mass index is 37.98 kg/m. Obesity  increased risk of mortality and morbidity.    Therapy evaluations are pending.     DVT prophylaxis: (Eliquis) Code Status: Full code Family Communication: none at bedside. Disposition:   Status is: Inpatient  Remains inpatient appropriate because: IV antibiotics.        Consultants:  Cardiology PCCM  Procedures: none.   Antimicrobials:  Antibiotics Given (last 72 hours)     Date/Time Action Medication Dose Rate   09/30/21 1200 New Bag/Given   ceFEPIme (MAXIPIME) 2 g in sodium chloride 0.9 % 100 mL IVPB 2 g 200 mL/hr   09/30/21 1620 New Bag/Given   vancomycin (VANCOREADY) IVPB 1500 mg/300 mL 1,500 mg 150 mL/hr   09/30/21 2144 New Bag/Given   ceFEPIme (MAXIPIME) 2 g in sodium chloride 0.9 % 100 mL IVPB 2 g 200 mL/hr   10/01/21 0943 New Bag/Given   ceFEPIme (MAXIPIME) 2 g in sodium chloride 0.9 % 100 mL IVPB 2 g 200 mL/hr   10/01/21 2156 New Bag/Given   ceFEPIme (MAXIPIME) 2 g in sodium chloride 0.9 % 100 mL IVPB 2 g 200 mL/hr   10/02/21 3818 New Bag/Given   ceFEPIme (MAXIPIME) 2 g in sodium chloride 0.9 % 100 mL IVPB 2 g 200 mL/hr   10/02/21 2135 New Bag/Given   ceFEPIme (MAXIPIME) 2 g in sodium chloride 0.9 % 100 mL IVPB 2 g 200 mL/hr   10/03/21 0902 New Bag/Given   ceFEPIme (MAXIPIME) 2 g in sodium chloride 0.9 % 100 mL IVPB 2 g 200 mL/hr         Subjective: Comfortable. No chest pain or sob. No nausea, vomiting. Wants to know when he can go home.   Objective: Vitals:   10/02/21 2339 10/03/21 0400 10/03/21 0456 10/03/21 1144  BP:   137/65 137/63  Pulse: 89  81 79  Resp: 16  17 18   Temp:   98.4 F (36.9 C) 98.8 F (37.1 C)  TempSrc:   Oral Oral  SpO2: 97%  99% 99%  Weight:  123.5 kg    Height:        Intake/Output Summary (Last 24 hours) at 10/03/2021 1148 Last data filed at 10/03/2021 1146 Gross per 24 hour  Intake 1513.64 ml  Output 4050 ml  Net -2536.36 ml    Filed Weights   10/01/21 0500 10/01/21 2311 10/03/21 0400  Weight: 124.5 kg  125.5 kg 123.5 kg    Examination:  General exam: Appears calm and comfortable  Respiratory system: Clear to auscultation. Respiratory effort normal. Cardiovascular system: S1 & S2 heard, RRR. No JVD,  No pedal edema. Gastrointestinal system: Abdomen is nondistended, soft and nontender. Normal bowel sounds heard. Central nervous system: Alert and oriented. No focal neurological deficits. Extremities: Symmetric 5 x 5 power. Skin: No rashes, lesions or ulcers Psychiatry:  Mood & affect appropriate.      Data Reviewed: I have personally reviewed following labs and imaging studies  CBC: Recent Labs  Lab 09/29/21 1050 09/30/21 0151 10/01/21 0853 10/02/21 0215 10/03/21 0354  WBC 13.8* 25.6* 21.6* 21.5* 19.1*  NEUTROABS  --  20.7*  --  18.0*  --   HGB 11.3* 10.0* 8.6* 8.9* 8.8*  HCT 36.5* 34.1* 28.1* 29.1* 29.6*  MCV 87.7 90.5 87.8 88.7 89.2  PLT 264 216 216 230 253     Basic  Metabolic Panel: Recent Labs  Lab 09/27/21 0139 09/28/21 0320 09/29/21 0251 09/30/21 0151 10/01/21 0853 10/02/21 0215 10/03/21 0354  NA 136 135 136 141 135 138 141  K 4.4 4.6 4.1 4.1 3.6 3.6 4.5  CL 94* 93* 96* 99 99 101 104  CO2 32 29 34* 25 30 30 30   GLUCOSE 121* 122* 112* 125* 175* 143* 123*  BUN 39* 43* 42* 42* 39* 37* 25*  CREATININE 2.58* 2.91* 2.35* 2.40* 2.14* 2.08* 1.83*  CALCIUM 8.5* 8.8* 8.7* 8.8* 8.1* 8.3* 9.2  MG 2.2  --   --  2.0  --   --  2.3  PHOS  --  4.4 4.0  --  3.1  --   --      GFR: Estimated Creatinine Clearance: 52.4 mL/min (A) (by C-G formula based on SCr of 1.83 mg/dL (H)).  Liver Function Tests: Recent Labs  Lab 09/28/21 0320 09/29/21 0251 09/30/21 0151 10/01/21 0853  AST  --   --  30  --   ALT  --   --  21  --   ALKPHOS  --   --  64  --   BILITOT  --   --  1.0  --   PROT  --   --  7.1  --   ALBUMIN 2.9* 2.8* 2.8* 2.3*     CBG: Recent Labs  Lab 10/02/21 1143 10/02/21 1616 10/02/21 2103 10/03/21 0504 10/03/21 1146  GLUCAP 187* 169* 185* 150*  156*      Recent Results (from the past 240 hour(s))  Resp Panel by RT-PCR (Flu A&B, Covid) Nasopharyngeal Swab     Status: None   Collection Time: 09/25/21  2:51 AM   Specimen: Nasopharyngeal Swab; Nasopharyngeal(NP) swabs in vial transport medium  Result Value Ref Range Status   SARS Coronavirus 2 by RT PCR NEGATIVE NEGATIVE Final    Comment: (NOTE) SARS-CoV-2 target nucleic acids are NOT DETECTED.  The SARS-CoV-2 RNA is generally detectable in upper respiratory specimens during the acute phase of infection. The lowest concentration of SARS-CoV-2 viral copies this assay can detect is 138 copies/mL. A negative result does not preclude SARS-Cov-2 infection and should not be used as the sole basis for treatment or other patient management decisions. A negative result may occur with  improper specimen collection/handling, submission of specimen other than nasopharyngeal swab, presence of viral mutation(s) within the areas targeted by this assay, and inadequate number of viral copies(<138 copies/mL). A negative result must be combined with clinical observations, patient history, and epidemiological information. The expected result is Negative.  Fact Sheet for Patients:  EntrepreneurPulse.com.au  Fact Sheet for Healthcare Providers:  IncredibleEmployment.be  This test is no t yet approved or cleared by the [place] FDA and  has been authorized for detection and/or diagnosis of SARS-CoV-2 by FDA under an Emergency Use Authorization (EUA). This EUA will remain  in effect (meaning this test can be used) for the duration of the COVID-19 declaration under Section 564(b)(1) of the Act, 21 U.S.C.section 360bbb-3(b)(1), unless the authorization is terminated  or revoked sooner.       Influenza A by PCR NEGATIVE NEGATIVE Final   Influenza B by PCR NEGATIVE NEGATIVE Final    Comment: (NOTE) The Xpert Xpress SARS-CoV-2/FLU/RSV plus assay is  intended as an aid in the diagnosis of influenza from Nasopharyngeal swab specimens and should not be used as a sole basis for treatment. Nasal washings and aspirates are unacceptable for Xpert Xpress SARS-CoV-2/FLU/RSV testing.  Fact  Sheet for Patients: EntrepreneurPulse.com.au  Fact Sheet for Healthcare Providers: IncredibleEmployment.be  This test is not yet approved or cleared by the [place] FDA and has been authorized for detection and/or diagnosis of SARS-CoV-2 by FDA under an Emergency Use Authorization (EUA). This EUA will remain in effect (meaning this test can be used) for the duration of the COVID-19 declaration under Section 564(b)(1) of the Act, 21 U.S.C. section 360bbb-3(b)(1), unless the authorization is terminated or revoked.  Performed at [facility], [place] [address] [place], [place] [address]   Culture, blood (routine x 2)     Status: None (Preliminary result)   Collection Time: 09/29/21 11:38 AM   Specimen: Left Antecubital; Blood  Result Value Ref Range Status   Specimen Description LEFT ANTECUBITAL  Final   Special Requests   Final    BOTTLES DRAWN AEROBIC AND ANAEROBIC Blood Culture adequate volume   Culture   Final    NO GROWTH 3 DAYS Performed at [facility], [place] [address], [place], [place] [address]    Report Status PENDING  Incomplete  Culture, blood (routine x 2)     Status: None (Preliminary result)   Collection Time: 09/29/21 11:47 AM   Specimen: Right Antecubital; Blood  Result Value Ref Range Status   Specimen Description RIGHT ANTECUBITAL  Final   Special Requests   Final    BOTTLES DRAWN AEROBIC AND ANAEROBIC Blood Culture adequate volume   Culture   Final    NO GROWTH 3 DAYS Performed at [facility], [place] [address], [place], [place] [address]    Report Status PENDING  Incomplete  Urine Culture     Status: Abnormal   Collection Time: 09/29/21  2:56 PM   Specimen: Urine,  Clean Catch  Result Value Ref Range Status   Specimen Description URINE, CLEAN CATCH  Final   Special Requests   Final    NONE Performed at [facility], [place] [address], [place], [place] [address]    Culture >=100,000 COLONIES/mL PSEUDOMONAS AERUGINOSA (A)  Final   Report Status 10/01/2021 FINAL  Final   Organism ID, Bacteria PSEUDOMONAS AERUGINOSA (A)  Final      Susceptibility   Pseudomonas aeruginosa - MIC*    CEFTAZIDIME 4 SENSITIVE Sensitive     CIPROFLOXACIN <=0.25 SENSITIVE Sensitive     GENTAMICIN <=1 SENSITIVE Sensitive     IMIPENEM 1 SENSITIVE Sensitive     PIP/TAZO 8 SENSITIVE Sensitive     CEFEPIME 2 SENSITIVE Sensitive     * >=100,000 COLONIES/mL PSEUDOMONAS AERUGINOSA  Respiratory (~20 pathogens) panel by PCR     Status: None   Collection Time: 09/30/21 10:35 AM   Specimen: Nasopharyngeal Swab; Respiratory  Result Value Ref Range Status   Adenovirus NOT DETECTED NOT DETECTED Final   Coronavirus 229E NOT DETECTED NOT DETECTED Final    Comment: (NOTE) The Coronavirus on the Respiratory Panel, DOES NOT test for the novel  Coronavirus (2019 nCoV)    Coronavirus HKU1 NOT DETECTED NOT DETECTED Final   Coronavirus NL63 NOT DETECTED NOT DETECTED Final   Coronavirus OC43 NOT DETECTED NOT DETECTED Final   Metapneumovirus NOT DETECTED NOT DETECTED Final   Rhinovirus / Enterovirus NOT DETECTED NOT DETECTED Final   Influenza A NOT DETECTED NOT DETECTED Final   Influenza B NOT DETECTED NOT DETECTED Final   Parainfluenza Virus 1 NOT DETECTED NOT DETECTED Final   Parainfluenza Virus 2 NOT DETECTED NOT DETECTED Final   Parainfluenza Virus  3 NOT DETECTED NOT DETECTED Final   Parainfluenza Virus 4 NOT DETECTED NOT DETECTED Final   Respiratory Syncytial Virus NOT DETECTED NOT DETECTED Final   Bordetella pertussis NOT DETECTED NOT DETECTED Final   Bordetella Parapertussis NOT DETECTED NOT DETECTED Final   Chlamydophila pneumoniae NOT DETECTED NOT DETECTED Final   Mycoplasma  pneumoniae NOT DETECTED NOT DETECTED Final    Comment: Performed at [facility], [place] [address], [place], [place] [address]  MRSA Next Gen by PCR, Nasal     Status: None   Collection Time: 09/30/21 10:02 PM   Specimen: Nasal Mucosa; Nasal Swab  Result Value Ref Range Status   MRSA by PCR Next Gen NOT DETECTED NOT DETECTED Final    Comment: (NOTE) The GeneXpert MRSA Assay (FDA approved for NASAL specimens only), is one component of a comprehensive MRSA colonization surveillance program. It is not intended to diagnose MRSA infection nor to guide or monitor treatment for MRSA infections. Test performance is not FDA approved in patients less than 87 years old. Performed at [facility], [place] [address] [place], [place] [address]           Radiology Studies: No results found.      Scheduled Meds:  amiodarone  200 mg Oral Daily   apixaban  5 mg Oral BID   bisacodyl  10 mg Rectal Once   clopidogrel  75 mg Oral q morning   gabapentin  600 mg Oral BID   insulin aspart  0-9 Units Subcutaneous TID WC   latanoprost  1 drop Both Eyes QHS   oxyCODONE  10 mg Oral Q12H   pantoprazole  40 mg Oral BID   polyethylene glycol  17 g Oral Daily   rosuvastatin  20 mg Oral Daily   senna-docusate  1 tablet Oral Daily   sodium chloride flush  3 mL Intravenous Q12H   Continuous Infusions:  ceFEPime (MAXIPIME) IV 2 g (10/03/21 0902)     LOS: 8 days      Hosie Poisson, MD [facility]   To contact the attending provider between 7A-7P or the covering provider during after hours 7P-7A, please log into the web site [email or web address] and access using universal [place] password for that web site. If you do not have the password, please call the hospital operator.  10/03/2021, 11:48 AM

## 2021-10-03 NOTE — Progress Notes (Signed)
Progress Note  Patient Name: Lawrence Rose Date of Encounter: 10/03/2021  [facility] Cardiologist: Werner Lean, MD   Subjective   No events overnight. No blood culture growth. Creatinine continues to improve BP has improved  Inpatient Medications    Scheduled Meds:  amiodarone  200 mg Oral Daily   apixaban  5 mg Oral BID   Chlorhexidine Gluconate Cloth  6 each Topical Daily   clopidogrel  75 mg Oral q morning   gabapentin  600 mg Oral BID   insulin aspart  0-9 Units Subcutaneous TID WC   latanoprost  1 drop Both Eyes QHS   oxyCODONE  10 mg Oral Q12H   pantoprazole  40 mg Oral BID   polyethylene glycol  17 g Oral Daily   rosuvastatin  20 mg Oral Daily   senna-docusate  1 tablet Oral Daily   sodium chloride flush  3 mL Intravenous Q12H   Continuous Infusions:  ceFEPime (MAXIPIME) IV 2 g (10/02/21 2135)   PRN Meds: acetaminophen **OR** acetaminophen, albuterol, alum & mag hydroxide-simeth, HYDROcodone-acetaminophen, neomycin-bacitracin-polymyxin, ondansetron **OR** ondansetron (ZOFRAN) IV, polyvinyl alcohol   Vital Signs    Vitals:   10/02/21 1921 10/02/21 2339 10/03/21 0400 10/03/21 0456  BP: (!) 129/51   137/65  Pulse: 78 89  81  Resp: 18 16  17   Temp: 98 F (36.7 C)   98.4 F (36.9 C)  TempSrc: Oral   Oral  SpO2: 99% 97%  99%  Weight:   123.5 kg   Height:        Intake/Output Summary (Last 24 hours) at 10/03/2021 0740 Last data filed at 10/03/2021 0700 Gross per 24 hour  Intake 1953.13 ml  Output 4100 ml  Net -2146.87 ml   Last 3 Weights 10/03/2021 10/01/2021 10/01/2021  Weight (lbs) 272 lb 4.8 oz 276 lb 9.6 oz 274 lb 7.6 oz  Weight (kg) 123.514 kg 125.465 kg 124.5 kg      Telemetry    SR - Personally Reviewed  ECG    No new ECG - Personally Reviewed  Physical Exam   Gen: no distress, morbid obesity   Neck: No JVD Cardiac: No Rubs or Gallops, Systolic murmur, regular rhythm +2 radial pulses Respiratory: Clear to  auscultation bilaterally, normal effort, normal  respiratory rate GI: Soft, nontender distended with dullness to percussion MS: +1 pitting edema R leg  moves all extremities Integument: Skin feels warm Neuro:  At time of evaluation, alert and oriented to person/place/time/situation  Psych: Normal affect, patient feels better   Labs    High Sensitivity Troponin:   Recent Labs  Lab 09/25/21 0207 09/25/21 0415  TROPONINIHS 13 17     Chemistry Recent Labs  Lab 09/27/21 0139 09/28/21 0320 09/29/21 0251 09/30/21 0151 10/01/21 0853 10/02/21 0215 10/03/21 0354  NA 136   < > 136 141 135 138 141  K 4.4   < > 4.1 4.1 3.6 3.6 4.5  CL 94*   < > 96* 99 99 101 104  CO2 32   < > 34* 25 30 30 30   GLUCOSE 121*   < > 112* 125* 175* 143* 123*  BUN 39*   < > 42* 42* 39* 37* 25*  CREATININE 2.58*   < > 2.35* 2.40* 2.14* 2.08* 1.83*  CALCIUM 8.5*   < > 8.7* 8.8* 8.1* 8.3* 9.2  MG 2.2  --   --  2.0  --   --  2.3  PROT  --   --   --  7.1  --   --   --   ALBUMIN  --    < > 2.8* 2.8* 2.3*  --   --   AST  --   --   --  30  --   --   --   ALT  --   --   --  21  --   --   --   ALKPHOS  --   --   --  64  --   --   --   BILITOT  --   --   --  1.0  --   --   --   GFRNONAA 26*   < > 30* 29* 33* 34* 40*  ANIONGAP 10   < > 6 17* 6 7 7    < > = values in this interval not displayed.    Lipids No results for input(s): CHOL, TRIG, HDL, LABVLDL, LDLCALC, CHOLHDL in the last 168 hours.  Hematology Recent Labs  Lab 10/01/21 0853 10/02/21 0215 10/03/21 0354  WBC 21.6* 21.5* 19.1*  RBC 3.20* 3.28* 3.32*  HGB 8.6* 8.9* 8.8*  HCT 28.1* 29.1* 29.6*  MCV 87.8 88.7 89.2  MCH 26.9 27.1 26.5  MCHC 30.6 30.6 29.7*  RDW 15.3 15.2 15.3  PLT 216 230 253   Thyroid No results for input(s): TSH, FREET4 in the last 168 hours.  BNP No results for input(s): BNP, PROBNP in the last 168 hours.  DDimer No results for input(s): DDIMER in the last 168 hours.   Radiology    No results found.  Cardiac Studies    Echo 09/27/21:  1. Left ventricular ejection fraction, by estimation, is 60 to 65%. The  left ventricle has normal function. The left ventricle has no regional  wall motion abnormalities. There is mild left ventricular hypertrophy.  Left ventricular diastolic parameters  were normal.   2. Right ventricular systolic function is normal. The right ventricular  size is normal.   3. Left atrial size was moderately dilated.   4. Right atrial size was moderately dilated.   5. The pericardial effusion is lateral to the left ventricle.   6. The mitral valve is degenerative. No evidence of mitral valve  regurgitation. No evidence of mitral stenosis. Moderate mitral annular  calcification.   7. Post 29 mm [employer] supra annular Core valve stable gradients since  01/19/21 trivial AR likely PVL . The aortic valve has been  repaired/replaced. Aortic valve regurgitation is trivial. No aortic  stenosis is present.   8. The inferior vena cava is normal in size with greater than 50%  respiratory variability, suggesting right atrial pressure of 3 mmHg.   Patient Profile     65 y.o. male  with a hx of HTN, HFpEF, severe AS s/p TAVR 08/2020, pAF/AFL, DM, CKDIII, R TKA, OSA on CPAP who is being seen 09/26/2021 for the evaluation of decompensated HF  Assessment & Plan    Acute on Chronic Diastolic Heart Failure:  -auto-diuresing with improvement in AKI - hold diuretics but will need outpatient diuretic (likely lasix 40 mg PO daily with close follow up- we are working on TOC HF f/u)  OSA - nocturnal CPAP  Sepsis secondary to pseudomonas UTI Blood cultures NGTD - ABX per primary (planned for 5 days of cefepime, started 09/29/21)   AKI on CKD (improving):  - Creatinine 1.6 on admission, which appears baseline, Down to 1.8 today   Paroxysmal atrial fibrillation:  HTN -Continue amiodarone, Eliquis. - Holding  metoprolol, diltiazem given hypotension  - if blood pressure improves and kidney  recovers, will restart metoprolol 50 mg PO BID at discharge, lisinopril restart as outpatient - may not need prior hydalazine at DC   Severe aortic stenosis status post TAVR 08/2020:  No no evidence of vegetations   Nearing hospital DC; today is the last day of antibiotics   For questions or updates, please contact [facility] Please consult [email or web address] for contact info under        Signed, Werner Lean, MD  10/03/2021, 7:40 AM

## 2021-10-03 NOTE — Plan of Care (Signed)
Problem: Skin Integrity: Goal: Risk for impaired skin integrity will decrease Outcome: Completed/Met   Problem: Safety: Goal: Ability to remain free from injury will improve Outcome: Completed/Met   Problem: Elimination: Goal: Will not experience complications related to urinary retention Outcome: Completed/Met   Problem: Elimination: Goal: Will not experience complications related to bowel motility Outcome: Completed/Met   Problem: Coping: Goal: Level of anxiety will decrease Outcome: Completed/Met   Problem: Nutrition: Goal: Adequate nutrition will be maintained Outcome: Completed/Met

## 2021-10-04 LAB — CULTURE, BLOOD (ROUTINE X 2)
Culture: NO GROWTH
Culture: NO GROWTH
Special Requests: ADEQUATE
Special Requests: ADEQUATE

## 2021-10-04 LAB — TROPONIN I (HIGH SENSITIVITY)
Troponin I (High Sensitivity): 13 ng/L (ref <18)
Troponin I (High Sensitivity): 12 ng/L (ref <18)

## 2021-10-04 LAB — CBC
HCT: 28.4 % — ABNORMAL LOW (ref 39.0–52.0)
Hemoglobin: 8.7 g/dL — ABNORMAL LOW (ref 13.0–17.0)
MCH: 26.9 pg (ref 26.0–34.0)
MCHC: 30.6 g/dL (ref 30.0–36.0)
MCV: 87.9 fL (ref 80.0–100.0)
Platelets: 234 10*3/uL (ref 150–400)
RBC: 3.23 MIL/uL — ABNORMAL LOW (ref 4.22–5.81)
RDW: 15.3 % (ref 11.5–15.5)
WBC: 13 10*3/uL — ABNORMAL HIGH (ref 4.0–10.5)
nRBC: 0 % (ref 0.0–0.2)

## 2021-10-04 LAB — GLUCOSE, CAPILLARY
Glucose-Capillary: 135 mg/dL — ABNORMAL HIGH (ref 70–99)
Glucose-Capillary: 176 mg/dL — ABNORMAL HIGH (ref 70–99)

## 2021-10-04 LAB — BASIC METABOLIC PANEL
Anion gap: 7 (ref 5–15)
BUN: 22 mg/dL (ref 8–23)
CO2: 29 mmol/L (ref 22–32)
Calcium: 8.6 mg/dL — ABNORMAL LOW (ref 8.9–10.3)
Chloride: 102 mmol/L (ref 98–111)
Creatinine, Ser: 1.94 mg/dL — ABNORMAL HIGH (ref 0.61–1.24)
GFR, Estimated: 37 mL/min — ABNORMAL LOW (ref >60)
Glucose, Bld: 151 mg/dL — ABNORMAL HIGH (ref 70–99)
Potassium: 4.1 mmol/L (ref 3.5–5.1)
Sodium: 138 mmol/L (ref 135–145)

## 2021-10-04 LAB — BRAIN NATRIURETIC PEPTIDE: B Natriuretic Peptide: 184.7 pg/mL — ABNORMAL HIGH (ref 0.0–100.0)

## 2021-10-04 MED ORDER — FUROSEMIDE 40 MG PO TABS: 40.0000 mg | ORAL_TABLET | Freq: Every day | ORAL | Status: DC

## 2021-10-04 MED ORDER — NITROGLYCERIN 0.4 MG SL SUBL
SUBLINGUAL_TABLET | SUBLINGUAL | Status: AC
  Administered 2021-10-04: 03:00:00 0.4 mg
  Filled 2021-10-04: qty 1

## 2021-10-04 MED ORDER — NITROGLYCERIN 0.4 MG SL SUBL
SUBLINGUAL_TABLET | SUBLINGUAL | Status: AC
  Filled 2021-10-04: qty 1

## 2021-10-04 MED ORDER — POLYETHYLENE GLYCOL 3350 17 G PO PACK: 17.0000 g | PACK | Freq: Every day | ORAL | 0 refills | Status: AC

## 2021-10-04 MED ORDER — MORPHINE SULFATE (PF) 2 MG/ML IV SOLN: 2.0000 mg | INTRAVENOUS | Status: DC | PRN

## 2021-10-04 MED ORDER — SENNOSIDES-DOCUSATE SODIUM 8.6-50 MG PO TABS: 1.0000 | ORAL_TABLET | Freq: Every day | ORAL | Status: AC

## 2021-10-04 NOTE — Discharge Summary (Signed)
Physician Discharge Summary  Lawrence Rose YDS:[id number] DOB: 1956-08-12 DOA: 09/24/2021  PCP: Doreatha Lew, MD  Admit date: 09/24/2021 Discharge date: 10/04/2021  Admitted From: Home.  Disposition:  Home.   Recommendations for Outpatient Follow-up:  Follow up with PCP in 1-2 weeks Please obtain BMP/CBC in one week Please follow up with cardiology as outpatient as needed.   Home Health:Yes  Discharge Condition: stable.  CODE STATUS:full code.  Diet recommendation: Heart Healthy / Carb Modified   Brief/Interim Summary:  [name] is a 65 year old male with PMHx of HFpEF, severe aortic stenosis s/p TAVR 08/2020, hypertension, paroxysmal atrial fibrillation/flutter s/p DCCV on chronic anticoagulation, type II diabetes mellitus, CKDIII, OSA on CPAP, right TKA 07/2021 who presented with progressively worsening shortness of breath for several days admitted for acute on chronic heart failure exacerbation after outpatient discontinuation of his lasix by PCP. Patient had been diuresing well during this admission with IV Lasix with 7L off since admission. Lasix discontinued on 12/12 due to worsening renal function with sCr up to 2.9 (baseline sCr 1.5) and patient appearing euvolemic. Throughout the day on 12/14, patient more lethargic and developed fevers with Tmax 104.8 for which patient given tylenol.  PCCM consulted for sepsis and hypotension.    12/10 admitted for acute on chronic hypoxic respiratory failure 2/2 HFpEF exacerbation, diuresis started, cardiology consulted. 12/14 developed fevers to Tmax 104.8 and intermittent episodes of hypotension, started on BiPAP for lethargy and hypercarbia on ABG 12/15 PCCM consulted for ongoing hypotension and fevers, concerns for sepsis   pt transferred to [facility] service on 10/02/21.    Discharge Diagnoses:  Principal Problem:   CHF exacerbation ([place]) Active Problems:   Sepsis ([place])   OSA on CPAP   Severe aortic stenosis   AKI (acute  kidney injury) ([place])   Acute on chronic diastolic (congestive) heart failure (HCC)   Normocytic anemia   S/P TAVR (transcatheter aortic valve replacement)   Chronic anticoagulation   Diabetes mellitus type 2 in obese (HCC)   PAF (paroxysmal atrial fibrillation) (HCC)   Respiratory failure with hypoxia (HCC)   Chest pain   Chronic pain syndrome   CHF (congestive heart failure) (HCC)  Acute on chronic respiratory failure with hypoxia and hypercapnia secondary to acute diastolic heart failure Recommend to continue with CPAP at nights and nasal cannula oxygen during the day Cardiology on board for acute diastolic heart failure, currently diuresis on hold, due to borderline blood pressures. BP parameters have improved.  Will probably need to be started on oral lasix 40 mg daily as outpatient.      Sepsis secondary to Pseudomonas urinary tract infection with associated AKI and acute metabolic encephalopathy Complete the course of cefepime as per PCCM recommendations. Sepsis physiology has improved and blood pressure parameters have improved. Completed the course of antibiotics.  Afebrile, improving leukocytosis.      Acute metabolic encephalopathy secondary to hypercapnia and sepsis Continue with CPAP at night and complete the course of antibiotics. Patient is alert and oriented and appears to be back to baseline. No issues.      Acute kidney injury on stage IIIa CKD Baseline creatinine around 1.6 Avoid nephrotoxic medications and vancomycin was already discontinued. Creatinine has improved to 1.83.    Anemia of chronic disease Transfuse to keep hemoglobin greater than 7. Hemoglobin is stable around 8.      History of paroxysmal atrial fibrillation Rate control with amiodarone and on Eliquis for anticoagulation.     Obstructive sleep  apnea on CPAP     Chronic pain syndrome Continue with gabapentin     History of coronary artery disease Patient currently denies any  chest pain or shortness of breath Continue with Plavix and statin.       Diabetes mellitus type 2 Resume home meds on discharge.          GERD Stable.   Body mass index is 37.98 kg/m. Obesity increased risk of mortality and morbidity.     Discharge Instructions  Discharge Instructions     (HEART FAILURE PATIENTS) Call MD:  Anytime you have any of the following symptoms: 1) 3 pound weight gain in 24 hours or 5 pounds in 1 week 2) shortness of breath, with or without a dry hacking cough 3) swelling in the hands, feet or stomach 4) if you have to sleep on extra pillows at night in order to breathe.   Complete by: As directed    Diet - low sodium heart healthy   Complete by: As directed    Increase activity slowly   Complete by: As directed       Allergies as of 10/04/2021       Reactions   Cocoa Hives   Sulfamethoxazole-trimethoprim Anaphylaxis   Chocolate Hives   Citrus Hives   Reaction to oranges and orange juice   Fish Allergy Hives, Swelling   Throat swelling   Shellfish Allergy Hives, Swelling   Throat swelling   Sulfa Antibiotics Hives, Swelling   Throat swelling   Tetracycline Swelling        Medication List     STOP taking these medications    amoxicillin 500 MG capsule Commonly known as: AMOXIL   lisinopril 40 MG tablet Commonly known as: ZESTRIL   metFORMIN 1000 MG tablet Commonly known as: GLUCOPHAGE   Tiadylt ER 240 MG 24 hr capsule Generic drug: diltiazem       TAKE these medications    albuterol 108 (90 Base) MCG/ACT inhaler Commonly known as: VENTOLIN HFA Inhale 2 puffs into the lungs every 4 (four) hours as needed for wheezing or shortness of breath.   amiodarone 200 MG tablet Commonly known as: PACERONE Take 1 tablet by mouth once daily   apixaban 5 MG Tabs tablet Commonly known as: Eliquis Take 1 tablet (5 mg total) by mouth 2 (two) times daily.   Cholecalciferol 25 MCG (1000 UT) tablet Take 1,000 Units by mouth at  bedtime.   clopidogrel 75 MG tablet Commonly known as: PLAVIX Take 75 mg by mouth every morning.   Fifty50 Glucose Meter 2.0 w/Device Kit 1 each by Other route in the morning, at noon, and at bedtime.   furosemide 40 MG tablet Commonly known as: LASIX Take 1 tablet (40 mg total) by mouth daily.   gabapentin 300 MG capsule Commonly known as: NEURONTIN Take 600 mg by mouth 2 (two) times daily.   GERITOL PO Take 1 tablet by mouth daily.   hydrALAZINE 25 MG tablet Commonly known as: APRESOLINE Take 1 tablet (25 mg total) by mouth every 8 (eight) hours.   HYDROcodone-acetaminophen 10-325 MG tablet Commonly known as: NORCO Take 1 tablet by mouth every 6 (six) hours as needed for moderate pain.   latanoprost 0.005 % ophthalmic solution Commonly known as: XALATAN Place 1 drop into both eyes at bedtime.   metoprolol tartrate 50 MG tablet Commonly known as: LOPRESSOR Take 1 tablet (50 mg total) by mouth 2 (two) times daily.   multivitamin with minerals Tabs tablet  Take 1 tablet by mouth at bedtime.   neomycin-bacitracin-polymyxin [phone] ointment Apply 1 application topically 2 (two) times daily as needed (itching).   pantoprazole 40 MG tablet Commonly known as: PROTONIX Take 40 mg by mouth 2 (two) times daily.   polyethylene glycol 17 g packet Commonly known as: MIRALAX / GLYCOLAX Take 17 g by mouth daily. Start taking on: October 05, 2021   PRESCRIPTION MEDICATION Inhale into the lungs at bedtime. CPAP   rosuvastatin 20 MG tablet Commonly known as: CRESTOR Take 20 mg by mouth daily.   senna-docusate 8.6-50 MG tablet Commonly known as: Senokot-S Take 1 tablet by mouth daily. Start taking on: October 05, 2021   Xtampza ER 9 MG C12a Generic drug: oxyCODONE ER Take 9 mg by mouth every 12 (twelve) hours.        Follow-up Information     [facility]. Go on 10/13/2021.   Specialty: Cardiology Why: Wednesday,  December 28 @ 2pm for Transition of Care appt within [place]. Bring all medications with you. FREE valet parking at [employer], off [address] [employer]. Contact information: [address] [phone] [place]        Lendon Colonel, NP Follow up on 11/05/2021.   Specialties: Nurse Practitioner, Radiology, Cardiology Why: Cardiology Hospital Follow-up on 11/05/2021 at 8:25 AM. Would discuss at appointment on 10/13/2021 if needing to keep this one. If not, can cancel. Contact information: [address] [place] [address] [phone]                Allergies  Allergen Reactions   Cocoa Hives   Sulfamethoxazole-Trimethoprim Anaphylaxis   Chocolate Hives   Citrus Hives    Reaction to oranges and orange juice   Fish Allergy Hives and Swelling    Throat swelling   Shellfish Allergy Hives and Swelling    Throat swelling   Sulfa Antibiotics Hives and Swelling    Throat swelling   Tetracycline Swelling    Consultations: Cardiology.   Procedures/Studies: DG Chest 2 View  Result Date: 09/24/2021 CLINICAL DATA:  Short of breath for several days EXAM: CHEST - 2 VIEW COMPARISON:  09/24/2021 at 2:53 p.m. FINDINGS: Frontal and lateral views of the chest demonstrate postsurgical changes from aortic valve prosthesis. Cardiac silhouette is mildly enlarged. Since the exam performed earlier today, there has been interval progression of the bibasilar consolidation, with development of diffuse interstitial prominence. There are stable small bilateral pleural effusions. No pneumothorax. IMPRESSION: 1. Findings consistent with progressive congestive heart failure, with worsening volume status since earlier today. Electronically Signed   By: Randa Ngo M.D.   On: 09/24/2021 18:14   US RENAL  Result Date: 09/28/2021 CLINICAL DATA:  Acute renal insufficiency EXAM: RENAL / URINARY TRACT ULTRASOUND COMPLETE  COMPARISON:  10/28/2020 abdominal MRI. FINDINGS: Right Kidney: Renal measurements: 10.5 x 6.0 x 5.0 cm = volume: 167 mL. No hydronephrosis. Normal renal echogenicity. Interpolar right renal 1.0 cm cyst. Left Kidney: Renal measurements: 11.7 x 5.9 x 6.1 cm = volume: 217 mL. Normal renal echogenicity. No hydronephrosis. Bladder: Appears normal for degree of bladder distention. Other: Incidental note is made of soft tissue echogenicity within the gallbladder lumen at 2.6 cm. New since MRI of 10/28/2020. No gallbladder wall thickening or pericholecystic fluid. IMPRESSION: 1.  No acute process or explanation for renal insufficiency. 2. Soft tissue echogenicity within the gallbladder is favored to represent tumefactive sludge, given development since  January. Recommend dedicated right upper quadrant ultrasound follow-up at 3 months. Electronically Signed   By: Abigail Miyamoto M.D.   On: 09/28/2021 16:58   DG CHEST PORT 1 VIEW  Result Date: 09/29/2021 CLINICAL DATA:  Shortness of breath, hypoxia. EXAM: PORTABLE CHEST 1 VIEW COMPARISON:  September 24, 2021. FINDINGS: Stable cardiomediastinal silhouette. Mild bibasilar atelectasis or edema is noted with small bilateral pleural effusions. Bony thorax is unremarkable. IMPRESSION: Stable bibasilar atelectasis or edema is noted with associated pleural effusions. Electronically Signed   By: Marijo Conception M.D.   On: 09/29/2021 13:39   ECHOCARDIOGRAM LIMITED  Result Date: 09/27/2021    ECHOCARDIOGRAM LIMITED REPORT   Patient Name:   KOURY RODDY Date of Exam: 09/27/2021 Medical Rec #:  [id number]      Height:       71.0 in Accession #:    [id number]     Weight:       271.4 lb Date of Birth:  January 05, 1956      BSA:          2.401 m Patient Age:    65 years       BP:           108/53 mmHg Patient Gender: M              HR:           62 bpm. Exam Location:  Inpatient Procedure: Limited Echo and Limited Color Doppler Indications:    CHF  History:        Patient has prior history  of Echocardiogram examinations, most                 recent 08/26/2021. CHF, Arrythmias:Atrial Flutter,                 Signs/Symptoms:Chest Pain; Risk Factors:Hypertension and                 Diabetes. 92m Medtronic Evolit Aortic valve.  Sonographer:    DWenda LowReferring Phys: [id number] 1. Left ventricular ejection fraction, by estimation, is 60 to 65%. The left ventricle has normal function. The left ventricle has no regional wall motion abnormalities. There is mild left ventricular hypertrophy. Left ventricular diastolic parameters were normal.  2. Right ventricular systolic function is normal. The right ventricular size is normal.  3. Left atrial size was moderately dilated.  4. Right atrial size was moderately dilated.  5. The pericardial effusion is lateral to the left ventricle.  6. The mitral valve is degenerative. No evidence of mitral valve regurgitation. No evidence of mitral stenosis. Moderate mitral annular calcification.  7. Post 29 mm Medtronic supra annular Core valve stable gradients since 01/19/21 trivial AR likely PVL . The aortic valve has been repaired/replaced. Aortic valve regurgitation is trivial. No aortic stenosis is present.  8. The inferior vena cava is normal in size with greater than 50% respiratory variability, suggesting right atrial pressure of 3 mmHg. FINDINGS  Left Ventricle: Left ventricular ejection fraction, by estimation, is 60 to 65%. The left ventricle has normal function. The left ventricle has no regional wall motion abnormalities. The left ventricular internal cavity size was normal in size. There is  mild left ventricular hypertrophy. Left ventricular diastolic parameters were normal. Right Ventricle: The right ventricular size is normal. No increase in right ventricular wall thickness. Right ventricular systolic function is normal. Left Atrium: Left atrial size was moderately dilated. Right Atrium: Right atrial size was  moderately dilated. Pericardium: Trivial pericardial effusion is present. The pericardial effusion is lateral to the left ventricle. Mitral Valve: The mitral valve is degenerative in appearance. There is mild thickening of the mitral valve leaflet(s). There is mild calcification of the mitral valve leaflet(s). Moderate mitral annular calcification. No evidence of mitral valve stenosis. Tricuspid Valve: The tricuspid valve is normal in structure. Tricuspid valve regurgitation is not demonstrated. No evidence of tricuspid stenosis. Aortic Valve: Post 29 mm Medtronic supra annular Core valve stable gradients since 01/19/21 trivial AR likely PVL. The aortic valve has been repaired/replaced. Aortic valve regurgitation is trivial. No aortic stenosis is present. Aortic valve mean gradient  measures 15.0 mmHg. Aortic valve peak gradient measures 25.2 mmHg. Aortic valve area, by VTI measures 1.36 cm. Pulmonic Valve: The pulmonic valve was normal in structure. Pulmonic valve regurgitation is not visualized. No evidence of pulmonic stenosis. Aorta: The aortic root is normal in size and structure. Venous: The inferior vena cava is normal in size with greater than 50% respiratory variability, suggesting right atrial pressure of 3 mmHg. IAS/Shunts: No atrial level shunt detected by color flow Doppler. LEFT VENTRICLE PLAX 2D LVIDd:         4.30 cm LVIDs:         2.30 cm LV PW:         1.50 cm LV IVS:        1.40 cm LVOT diam:     1.90 cm LV SV:         88 LV SV Index:   36 LVOT Area:     2.84 cm  LV Volumes (MOD) LV vol d, MOD A2C: 89.6 ml LV vol d, MOD A4C: 84.9 ml LV vol s, MOD A2C: 41.1 ml LV vol s, MOD A4C: 35.4 ml LV SV MOD A2C:     48.5 ml LV SV MOD A4C:     84.9 ml LV SV MOD BP:      51.3 ml LEFT ATRIUM         Index LA diam:    5.40 cm 2.25 cm/m  AORTIC VALVE AV Area (Vmax):    1.39 cm AV Area (Vmean):   1.26 cm AV Area (VTI):     1.36 cm AV Vmax:           251.00 cm/s AV Vmean:          182.000 cm/s AV VTI:             0.644 m AV Peak Grad:      25.2 mmHg AV Mean Grad:      15.0 mmHg LVOT Vmax:         123.00 cm/s LVOT Vmean:        80.800 cm/s LVOT VTI:          0.309 m LVOT/AV VTI ratio: 0.48  SHUNTS Systemic VTI:  0.31 m Systemic Diam: 1.90 cm Jenkins Rouge MD Electronically signed by Jenkins Rouge MD Signature Date/Time: 09/27/2021/4:34:21 PM    Final       Subjective:  No new complaints.  Discharge Exam: Vitals:   10/04/21 0700 10/04/21 0836  BP: (!) 128/58 (!) 128/57  Pulse: 76 77  Resp: 20   Temp:  98 F (36.7 C)  SpO2: 100% 100%   Vitals:   10/04/21 0329 10/04/21 0500 10/04/21 0700 10/04/21 0836  BP: 139/69  (!) 128/58 (!) 128/57  Pulse: 80  76 77  Resp:   20   Temp:  29 F (36.7 C)  TempSrc:   Oral Oral  SpO2:   100% 100%  Weight:  123.5 kg    Height:        General: Pt is alert, awake, not in acute distress Cardiovascular: RRR, S1/S2 +, no rubs, no gallops Respiratory: CTA bilaterally, no wheezing, no rhonchi Abdominal: Soft, NT, ND, bowel sounds + Extremities: no edema, no cyanosis    The results of significant diagnostics from this hospitalization (including imaging, microbiology, ancillary and laboratory) are listed below for reference.     Microbiology: Recent Results (from the past 240 hour(s))  Resp Panel by RT-PCR (Flu A&B, Covid) Nasopharyngeal Swab     Status: None   Collection Time: 09/25/21  2:51 AM   Specimen: Nasopharyngeal Swab; Nasopharyngeal(NP) swabs in vial transport medium  Result Value Ref Range Status   SARS Coronavirus 2 by RT PCR NEGATIVE NEGATIVE Final    Comment: (NOTE) SARS-CoV-2 target nucleic acids are NOT DETECTED.  The SARS-CoV-2 RNA is generally detectable in upper respiratory specimens during the acute phase of infection. The lowest concentration of SARS-CoV-2 viral copies this assay can detect is 138 copies/mL. A negative result does not preclude SARS-Cov-2 infection and should not be used as the sole basis for treatment or other  patient management decisions. A negative result may occur with  improper specimen collection/handling, submission of specimen other than nasopharyngeal swab, presence of viral mutation(s) within the areas targeted by this assay, and inadequate number of viral copies(<138 copies/mL). A negative result must be combined with clinical observations, patient history, and epidemiological information. The expected result is Negative.  Fact Sheet for Patients:  EntrepreneurPulse.com.au  Fact Sheet for Healthcare Providers:  IncredibleEmployment.be  This test is no t yet approved or cleared by the [place] FDA and  has been authorized for detection and/or diagnosis of SARS-CoV-2 by FDA under an Emergency Use Authorization (EUA). This EUA will remain  in effect (meaning this test can be used) for the duration of the COVID-19 declaration under Section 564(b)(1) of the Act, 21 U.S.C.section 360bbb-3(b)(1), unless the authorization is terminated  or revoked sooner.       Influenza A by PCR NEGATIVE NEGATIVE Final   Influenza B by PCR NEGATIVE NEGATIVE Final    Comment: (NOTE) The Xpert Xpress SARS-CoV-2/FLU/RSV plus assay is intended as an aid in the diagnosis of influenza from Nasopharyngeal swab specimens and should not be used as a sole basis for treatment. Nasal washings and aspirates are unacceptable for Xpert Xpress SARS-CoV-2/FLU/RSV testing.  Fact Sheet for Patients: EntrepreneurPulse.com.au  Fact Sheet for Healthcare Providers: IncredibleEmployment.be  This test is not yet approved or cleared by the [place] FDA and has been authorized for detection and/or diagnosis of SARS-CoV-2 by FDA under an Emergency Use Authorization (EUA). This EUA will remain in effect (meaning this test can be used) for the duration of the COVID-19 declaration under Section 564(b)(1) of the Act, 21 U.S.C. section  360bbb-3(b)(1), unless the authorization is terminated or revoked.  Performed at [facility], [place] [address] [place], [place] [address]   Culture, blood (routine x 2)     Status: None   Collection Time: 09/29/21 11:38 AM   Specimen: Left Antecubital; Blood  Result Value Ref Range Status   Specimen Description LEFT ANTECUBITAL  Final   Special Requests   Final    BOTTLES DRAWN AEROBIC AND ANAEROBIC Blood Culture adequate volume   Culture   Final    NO GROWTH 5 DAYS  Performed at [facility], [place] [address], [place], [place] [address]    Report Status 10/04/2021 FINAL  Final  Culture, blood (routine x 2)     Status: None   Collection Time: 09/29/21 11:47 AM   Specimen: Right Antecubital; Blood  Result Value Ref Range Status   Specimen Description RIGHT ANTECUBITAL  Final   Special Requests   Final    BOTTLES DRAWN AEROBIC AND ANAEROBIC Blood Culture adequate volume   Culture   Final    NO GROWTH 5 DAYS Performed at [facility], [place] [address], [place], [place] [address]    Report Status 10/04/2021 FINAL  Final  Urine Culture     Status: Abnormal   Collection Time: 09/29/21  2:56 PM   Specimen: Urine, Clean Catch  Result Value Ref Range Status   Specimen Description URINE, CLEAN CATCH  Final   Special Requests   Final    NONE Performed at [facility], [place] [address] [place], [place] [address]    Culture >=100,000 COLONIES/mL PSEUDOMONAS AERUGINOSA (A)  Final   Report Status 10/01/2021 FINAL  Final   Organism ID, Bacteria PSEUDOMONAS AERUGINOSA (A)  Final      Susceptibility   Pseudomonas aeruginosa - MIC*    CEFTAZIDIME 4 SENSITIVE Sensitive     CIPROFLOXACIN <=0.25 SENSITIVE Sensitive     GENTAMICIN <=1 SENSITIVE Sensitive     IMIPENEM 1 SENSITIVE Sensitive     PIP/TAZO 8 SENSITIVE Sensitive     CEFEPIME 2 SENSITIVE Sensitive     * >=100,000 COLONIES/mL PSEUDOMONAS AERUGINOSA  Respiratory (~20 pathogens) panel by PCR     Status: None    Collection Time: 09/30/21 10:35 AM   Specimen: Nasopharyngeal Swab; Respiratory  Result Value Ref Range Status   Adenovirus NOT DETECTED NOT DETECTED Final   Coronavirus 229E NOT DETECTED NOT DETECTED Final    Comment: (NOTE) The Coronavirus on the Respiratory Panel, DOES NOT test for the novel  Coronavirus (2019 nCoV)    Coronavirus HKU1 NOT DETECTED NOT DETECTED Final   Coronavirus NL63 NOT DETECTED NOT DETECTED Final   Coronavirus OC43 NOT DETECTED NOT DETECTED Final   Metapneumovirus NOT DETECTED NOT DETECTED Final   Rhinovirus / Enterovirus NOT DETECTED NOT DETECTED Final   Influenza A NOT DETECTED NOT DETECTED Final   Influenza B NOT DETECTED NOT DETECTED Final   Parainfluenza Virus 1 NOT DETECTED NOT DETECTED Final   Parainfluenza Virus 2 NOT DETECTED NOT DETECTED Final   Parainfluenza Virus 3 NOT DETECTED NOT DETECTED Final   Parainfluenza Virus 4 NOT DETECTED NOT DETECTED Final   Respiratory Syncytial Virus NOT DETECTED NOT DETECTED Final   Bordetella pertussis NOT DETECTED NOT DETECTED Final   Bordetella Parapertussis NOT DETECTED NOT DETECTED Final   Chlamydophila pneumoniae NOT DETECTED NOT DETECTED Final   Mycoplasma pneumoniae NOT DETECTED NOT DETECTED Final    Comment: Performed at [facility], [place]. [address], [place], [place] [address]  MRSA Next Gen by PCR, Nasal     Status: None   Collection Time: 09/30/21 10:02 PM   Specimen: Nasal Mucosa; Nasal Swab  Result Value Ref Range Status   MRSA by PCR Next Gen NOT DETECTED NOT DETECTED Final    Comment: (NOTE) The GeneXpert MRSA Assay (FDA approved for NASAL specimens only), is one component of a comprehensive MRSA colonization surveillance program. It is not intended to diagnose MRSA infection nor to guide or monitor treatment for MRSA infections. Test  performance is not FDA approved in patients less than 45 years old. Performed at [facility], [place] [address] [place], [place] [address]       Labs: BNP (last 3 results) Recent Labs    02/12/21 1217 09/25/21 0207 10/04/21 0358  BNP 433.7* 566.6* 940.7*   Basic Metabolic Panel: Recent Labs  Lab 09/28/21 0320 09/29/21 0251 09/30/21 0151 10/01/21 0853 10/02/21 0215 10/03/21 0354 10/04/21 0116  NA 135 136 141 135 138 141 138  K 4.6 4.1 4.1 3.6 3.6 4.5 4.1  CL 93* 96* 99 99 101 104 102  CO2 29 34* '25 30 30 30 29  ' GLUCOSE 122* 112* 125* 175* 143* 123* 151*  BUN 43* 42* 42* 39* 37* 25* 22  CREATININE 2.91* 2.35* 2.40* 2.14* 2.08* 1.83* 1.94*  CALCIUM 8.8* 8.7* 8.8* 8.1* 8.3* 9.2 8.6*  MG  --   --  2.0  --   --  2.3  --   PHOS 4.4 4.0  --  3.1  --   --   --    Liver Function Tests: Recent Labs  Lab 09/28/21 0320 09/29/21 0251 09/30/21 0151 10/01/21 0853  AST  --   --  30  --   ALT  --   --  21  --   ALKPHOS  --   --  64  --   BILITOT  --   --  1.0  --   PROT  --   --  7.1  --   ALBUMIN 2.9* 2.8* 2.8* 2.3*   No results for input(s): LIPASE, AMYLASE in the last 168 hours. No results for input(s): AMMONIA in the last 168 hours. CBC: Recent Labs  Lab 09/30/21 0151 10/01/21 0853 10/02/21 0215 10/03/21 0354 10/04/21 0116  WBC 25.6* 21.6* 21.5* 19.1* 13.0*  NEUTROABS 20.7*  --  18.0*  --   --   HGB 10.0* 8.6* 8.9* 8.8* 8.7*  HCT 34.1* 28.1* 29.1* 29.6* 28.4*  MCV 90.5 87.8 88.7 89.2 87.9  PLT 216 216 230 253 234   Cardiac Enzymes: No results for input(s): CKTOTAL, CKMB, CKMBINDEX, TROPONINI in the last 168 hours. BNP: Invalid input(s): POCBNP CBG: Recent Labs  Lab 10/03/21 1146 10/03/21 1631 10/03/21 2108 10/04/21 0606 10/04/21 1111  GLUCAP 156* 228* 143* 135* 176*   D-Dimer No results for input(s): DDIMER in the last 72 hours. Hgb A1c No results for input(s): HGBA1C in the last 72 hours. Lipid Profile No results for input(s): CHOL, HDL, LDLCALC, TRIG, CHOLHDL, LDLDIRECT in the last 72 hours. Thyroid function studies No results for input(s): TSH, T4TOTAL, T3FREE, THYROIDAB in the last  72 hours.  Invalid input(s): FREET3 Anemia work up No results for input(s): VITAMINB12, FOLATE, FERRITIN, TIBC, IRON, RETICCTPCT in the last 72 hours. Urinalysis    Component Value Date/Time   COLORURINE YELLOW 09/29/2021 [place] 09/29/2021 1353   LABSPEC 1.015 09/29/2021 1353   PHURINE 7.5 09/29/2021 1353   GLUCOSEU NEGATIVE 09/29/2021 1353   HGBUR TRACE (A) 09/29/2021 1353   BILIRUBINUR NEGATIVE 09/29/2021 1353   KETONESUR NEGATIVE 09/29/2021 1353   PROTEINUR NEGATIVE 09/29/2021 1353   NITRITE NEGATIVE 09/29/2021 1353   LEUKOCYTESUR MODERATE (A) 09/29/2021 1353   Sepsis Labs Invalid input(s): PROCALCITONIN,  WBC,  LACTICIDVEN Microbiology Recent Results (from the past 240 hour(s))  Resp Panel by RT-PCR (Flu A&B, Covid) Nasopharyngeal Swab     Status: None   Collection Time: 09/25/21  2:51 AM   Specimen: Nasopharyngeal Swab; Nasopharyngeal(NP)  swabs in vial transport medium  Result Value Ref Range Status   SARS Coronavirus 2 by RT PCR NEGATIVE NEGATIVE Final    Comment: (NOTE) SARS-CoV-2 target nucleic acids are NOT DETECTED.  The SARS-CoV-2 RNA is generally detectable in upper respiratory specimens during the acute phase of infection. The lowest concentration of SARS-CoV-2 viral copies this assay can detect is 138 copies/mL. A negative result does not preclude SARS-Cov-2 infection and should not be used as the sole basis for treatment or other patient management decisions. A negative result may occur with  improper specimen collection/handling, submission of specimen other than nasopharyngeal swab, presence of viral mutation(s) within the areas targeted by this assay, and inadequate number of viral copies(<138 copies/mL). A negative result must be combined with clinical observations, patient history, and epidemiological information. The expected result is Negative.  Fact Sheet for Patients:  EntrepreneurPulse.com.au  Fact Sheet for  Healthcare Providers:  IncredibleEmployment.be  This test is no t yet approved or cleared by the [place] FDA and  has been authorized for detection and/or diagnosis of SARS-CoV-2 by FDA under an Emergency Use Authorization (EUA). This EUA will remain  in effect (meaning this test can be used) for the duration of the COVID-19 declaration under Section 564(b)(1) of the Act, 21 U.S.C.section 360bbb-3(b)(1), unless the authorization is terminated  or revoked sooner.       Influenza A by PCR NEGATIVE NEGATIVE Final   Influenza B by PCR NEGATIVE NEGATIVE Final    Comment: (NOTE) The Xpert Xpress SARS-CoV-2/FLU/RSV plus assay is intended as an aid in the diagnosis of influenza from Nasopharyngeal swab specimens and should not be used as a sole basis for treatment. Nasal washings and aspirates are unacceptable for Xpert Xpress SARS-CoV-2/FLU/RSV testing.  Fact Sheet for Patients: EntrepreneurPulse.com.au  Fact Sheet for Healthcare Providers: IncredibleEmployment.be  This test is not yet approved or cleared by the [place] FDA and has been authorized for detection and/or diagnosis of SARS-CoV-2 by FDA under an Emergency Use Authorization (EUA). This EUA will remain in effect (meaning this test can be used) for the duration of the COVID-19 declaration under Section 564(b)(1) of the Act, 21 U.S.C. section 360bbb-3(b)(1), unless the authorization is terminated or revoked.  Performed at [facility], [place] [address] [place], [place] [address]   Culture, blood (routine x 2)     Status: None   Collection Time: 09/29/21 11:38 AM   Specimen: Left Antecubital; Blood  Result Value Ref Range Status   Specimen Description LEFT ANTECUBITAL  Final   Special Requests   Final    BOTTLES DRAWN AEROBIC AND ANAEROBIC Blood Culture adequate volume   Culture   Final    NO GROWTH 5 DAYS Performed at [facility], [place] [address] [place], [place] [address]    Report Status 10/04/2021 FINAL  Final  Culture, blood (routine x 2)     Status: None   Collection Time: 09/29/21 11:47 AM   Specimen: Right Antecubital; Blood  Result Value Ref Range Status   Specimen Description RIGHT ANTECUBITAL  Final   Special Requests   Final    BOTTLES DRAWN AEROBIC AND ANAEROBIC Blood Culture adequate volume   Culture   Final    NO GROWTH 5 DAYS Performed at [facility], [place] [address], [place], [place] [address]    Report Status 10/04/2021 FINAL  Final  Urine Culture     Status: Abnormal   Collection Time: 09/29/21  2:56 PM  Specimen: Urine, Clean Catch  Result Value Ref Range Status   Specimen Description URINE, CLEAN CATCH  Final   Special Requests   Final    NONE Performed at [facility], [address], [place], [place] [address]    Culture >=100,000 COLONIES/mL PSEUDOMONAS AERUGINOSA (A)  Final   Report Status 10/01/2021 FINAL  Final   Organism ID, Bacteria PSEUDOMONAS AERUGINOSA (A)  Final      Susceptibility   Pseudomonas aeruginosa - MIC*    CEFTAZIDIME 4 SENSITIVE Sensitive     CIPROFLOXACIN <=0.25 SENSITIVE Sensitive     GENTAMICIN <=1 SENSITIVE Sensitive     IMIPENEM 1 SENSITIVE Sensitive     PIP/TAZO 8 SENSITIVE Sensitive     CEFEPIME 2 SENSITIVE Sensitive     * >=100,000 COLONIES/mL PSEUDOMONAS AERUGINOSA  Respiratory (~20 pathogens) panel by PCR     Status: None   Collection Time: 09/30/21 10:35 AM   Specimen: Nasopharyngeal Swab; Respiratory  Result Value Ref Range Status   Adenovirus NOT DETECTED NOT DETECTED Final   Coronavirus 229E NOT DETECTED NOT DETECTED Final    Comment: (NOTE) The Coronavirus on the Respiratory Panel, DOES NOT test for the novel  Coronavirus (2019 nCoV)    Coronavirus HKU1 NOT DETECTED NOT DETECTED Final   Coronavirus NL63 NOT DETECTED NOT DETECTED Final   Coronavirus OC43 NOT DETECTED NOT DETECTED Final   Metapneumovirus NOT DETECTED NOT DETECTED Final    Rhinovirus / Enterovirus NOT DETECTED NOT DETECTED Final   Influenza A NOT DETECTED NOT DETECTED Final   Influenza B NOT DETECTED NOT DETECTED Final   Parainfluenza Virus 1 NOT DETECTED NOT DETECTED Final   Parainfluenza Virus 2 NOT DETECTED NOT DETECTED Final   Parainfluenza Virus 3 NOT DETECTED NOT DETECTED Final   Parainfluenza Virus 4 NOT DETECTED NOT DETECTED Final   Respiratory Syncytial Virus NOT DETECTED NOT DETECTED Final   Bordetella pertussis NOT DETECTED NOT DETECTED Final   Bordetella Parapertussis NOT DETECTED NOT DETECTED Final   Chlamydophila pneumoniae NOT DETECTED NOT DETECTED Final   Mycoplasma pneumoniae NOT DETECTED NOT DETECTED Final    Comment: Performed at [facility], [place]. [address] [place], [place] [address]  MRSA Next Gen by PCR, Nasal     Status: None   Collection Time: 09/30/21 10:02 PM   Specimen: Nasal Mucosa; Nasal Swab  Result Value Ref Range Status   MRSA by PCR Next Gen NOT DETECTED NOT DETECTED Final    Comment: (NOTE) The GeneXpert MRSA Assay (FDA approved for NASAL specimens only), is one component of a comprehensive MRSA colonization surveillance program. It is not intended to diagnose MRSA infection nor to guide or monitor treatment for MRSA infections. Test performance is not FDA approved in patients less than 1 years old. Performed at [facility], [place] [address], [place], [place] [address]      Time coordinating discharge: 41 minutes.   SIGNED:   Hosie Poisson, MD  [facility] 10/04/2021, 11:28 AM

## 2021-10-04 NOTE — Progress Notes (Incomplete)
Pt was having CP 10/10, BP was WNL ,ECG was WNL, MD notified and aware. Pt was ambulating a significant distance yesterday.  Pt also had a TAVR recently in the last few months or so.  Pt has had his Lasix held due to worsening kidney function as well.

## 2021-10-04 NOTE — Significant Event (Signed)
Pt with 8/10 CP.  Not improved with NTG.  Ordering BNP, Trop, and EKG.  RN trying hydrocodone.  ACS seems unlikely as pt had widely patent coronaries on LHC x13 months ago.

## 2021-10-04 NOTE — Care Management Important Message (Signed)
Important Message  Patient Details  Name: Lawrence Rose MRN: [id number] Date of Birth: [birth year]   Medicare Important Message Given:  Yes     Renie Ora 10/04/2021, 9:39 AM

## 2021-10-04 NOTE — TOC Transition Note (Signed)
Transition of Care [facility]) - CM/SW Discharge Note   Patient Details  Name: Lawrence Rose MRN: [id number] Date of Birth: 01/15/1956  Transition of Care [facility]) CM/SW Contact:  Bess Kinds, RN Phone Number: [phone] 10/04/2021, 12:55 PM   Clinical Narrative:     Spoke with patient at the bedside. Demographics verfied. Discusssed recommendations for [facility] PT. Agreeable. Offered choice of HH agency. Patient has uses [facility] in the past and would like them again. Referral accepted. No further TOC needs identified.   Final next level of care: Home w Home Health Services Barriers to Discharge: No Barriers Identified   Patient Goals and CMS Choice Patient states their goals for this hospitalization and ongoing recovery are:: ready to go home CMS Medicare.gov Compare Post Acute Care list provided to:: Patient Choice offered to / list presented to : Patient  Discharge Placement                       Discharge Plan and Services   Discharge Planning Services: CM Consult            DME Arranged: N/A DME Agency: NA       HH Arranged: PT, OT HH Agency: [facility] Date HH Agency Contacted: 10/04/21 Time HH Agency Contacted: [phone] Representative spoke with at [facility] Agency: Stacie  Social Determinants of Health (SDOH) Interventions Food Insecurity Interventions: Intervention Not Indicated Financial Strain Interventions: Intervention Not Indicated Housing Interventions: Intervention Not Indicated Transportation Interventions: Intervention Not Indicated   Readmission Risk Interventions Readmission Risk Prevention Plan 09/27/2021 01/19/2021  Transportation Screening Complete Complete  PCP or Specialist Appt within 3-5 Days Complete Complete  HRI or Home Care Consult Complete Complete  Social Work Consult for Recovery Care Planning/Counseling Complete Complete  Palliative Care Screening - Not Applicable  Medication Review Oceanographer) Complete Complete  Some  recent data might be hidden

## 2021-10-04 NOTE — Evaluation (Signed)
Occupational Therapy Evaluation/Discharge Patient Details Name: Lawrence Rose MRN: [id number] DOB: 24-Dec-1955 Today's Date: 10/04/2021   History of Present Illness [name] is a 65 year old male with PMHx of HFpEF, severe aortic stenosis s/p TAVR 08/2020, hypertension, paroxysmal atrial fibrillation/flutter s/p DCCV on chronic anticoagulation, type II diabetes mellitus, CKDIII, OSA on CPAP, right TKA 07/2021 who presented with progressively worsening shortness of breath for several days admitted for acute on chronic heart failure exacerbation after outpatient discontinuation of his lasix by PCP.   Clinical Impression   PTA, pt lives alone and reports Modified Independence with ADLs and mobility using RW. Pt's brother lives nearby and assists with laundry and community IADLs. Pt presents now with minor deficits in endurance and strength due to decreased OOB activity during admission. Despite deficits, pt able to complete UB/LB ADLs in prep for DC with no more than Setup Assist and mobilize in room using RW at Supervision level. Pt reports having AE to assist with LB ADLs as needed at home. Anticipate pt to progress well without skilled OT services follow-up at DC. Pt's brother at bedside and supportive. OT to sign off at acute level.      Recommendations for follow up therapy are one component of a multi-disciplinary discharge planning process, led by the attending physician.  Recommendations may be updated based on patient status, additional functional criteria and insurance authorization.   Follow Up Recommendations  No OT follow up    Assistance Recommended at Discharge PRN  Functional Status Assessment  Patient has had a recent decline in their functional status and demonstrates the ability to make significant improvements in function in a reasonable and predictable amount of time.  Equipment Recommendations  None recommended by OT    Recommendations for Other Services        Precautions / Restrictions Precautions Precautions: Fall Restrictions Weight Bearing Restrictions: No      Mobility Bed Mobility Overal bed mobility: Modified Independent                  Transfers Overall transfer level: Modified independent Equipment used: Rolling walker (2 wheels)                      Balance Overall balance assessment: Mild deficits observed, not formally tested                                         ADL either performed or assessed with clinical judgement   ADL Overall ADL's : Needs assistance/impaired Eating/Feeding: Independent   Grooming: Set up;Standing;Wash/dry face   Upper Body Bathing: Set up;Standing   Lower Body Bathing: Set up;Sit to/from stand Lower Body Bathing Details (indicate cue type and reason): to bathe peri region Upper Body Dressing : Set up;Sitting Upper Body Dressing Details (indicate cue type and reason): to don shirt sitting EOB Lower Body Dressing: Set up;Sit to/from stand Lower Body Dressing Details (indicate cue type and reason): to don pants sitting EOB, reports use of AE for socks Toilet Transfer: Supervision/safety;Ambulation;Rolling walker (2 wheels)   Toileting- Clothing Manipulation and Hygiene: Set up;Sit to/from stand       Functional mobility during ADLs: Supervision/safety;Rolling walker (2 wheels) General ADL Comments: appears close to baseline, slightly impaired endurance due to decreased OOB activity since admission     Vision Ability to See in Adequate Light: 0 Adequate Patient Visual Report:  No change from baseline Vision Assessment?: No apparent visual deficits     Perception     Praxis      Pertinent Vitals/Pain Pain Assessment: Faces Faces Pain Scale: Hurts little more Pain Location: L side Pain Descriptors / Indicators: Grimacing;Guarding Pain Intervention(s): Monitored during session;Limited activity within patient's tolerance     Hand Dominance  Right   Extremity/Trunk Assessment Upper Extremity Assessment Upper Extremity Assessment: Overall WFL for tasks assessed   Lower Extremity Assessment Lower Extremity Assessment: Defer to PT evaluation   Cervical / Trunk Assessment Cervical / Trunk Assessment: Normal   Communication Communication Communication: No difficulties   Cognition Arousal/Alertness: Awake/alert Behavior During Therapy: WFL for tasks assessed/performed Overall Cognitive Status: Within Functional Limits for tasks assessed                                       General Comments  brother entering at end of session    Exercises     Shoulder Instructions      Home Living Family/patient expects to be discharged to:: Private residence Living Arrangements: Alone Available Help at Discharge: Family;Available PRN/intermittently Type of Home: House Home Access: Stairs to enter [employer] of Steps: 4 Entrance Stairs-Rails: Right;Left Home Layout: Two level;Able to live on main level with bedroom/bathroom     Bathroom Shower/Tub: Occupational psychologist: Standard     Home Equipment: Conservation officer, nature (2 wheels);Cane - single point;BSC/3in1;Adaptive equipment Adaptive Equipment: Reacher;Sock aid        Prior Functioning/Environment Prior Level of Function : Needs assist       Physical Assist : ADLs (physical)   ADLs (physical): IADLs Mobility Comments: use of RW for mobility ADLs Comments: Reports able to complete ADLs without assist, sponge bathing vs showering, use of AE since TKA; able to complete meals in the home. Brother assists with laundry, transportation and grocery shopping        OT Problem List:        OT Treatment/Interventions:      OT Goals(Current goals can be found in the care plan section) Acute Rehab OT Goals Patient Stated Goal: go home today OT Goal Formulation: All assessment and education complete, DC therapy  OT Frequency:      Barriers to D/C:            Co-evaluation              AM-PAC OT "6 Clicks" Daily Activity     Outcome Measure Help from another person eating meals?: None Help from another person taking care of personal grooming?: None Help from another person toileting, which includes using toliet, bedpan, or urinal?: A Little Help from another person bathing (including washing, rinsing, drying)?: A Little Help from another person to put on and taking off regular upper body clothing?: A Little Help from another person to put on and taking off regular lower body clothing?: A Little 6 Click Score: 20   End of Session Equipment Utilized During Treatment: Rolling walker (2 wheels) Nurse Communication: Mobility status;Other (comment) (request for soda)  Activity Tolerance: Patient tolerated treatment well Patient left: in bed;with call bell/phone within reach;with family/visitor present  OT Visit Diagnosis: Muscle weakness (generalized) (M62.81)                Time: [id number] OT Time Calculation (min): 30 min Charges:  OT General Charges $OT Visit: 1 Visit OT  Evaluation $OT Eval Low Complexity: 1 Low OT Treatments $Self Care/Home Management : 8-22 mins  Bradd Canary, OTR/L Acute Rehab Services Office: [phone]   Lorre Munroe 10/04/2021, 12:38 PM

## 2021-10-04 NOTE — Progress Notes (Signed)
Heart Failure Nurse Navigator Progress Note  Confirmed HV TOC appt time with patient via phone. Per patient request, enrolled and scheduled [facility] for 12/28 @ 2pm. Will arrive to pt address at 1:05pm   No further questions from patient. States he is ready to go home.   Ozella Rocks, MSN, RN Heart Failure Nurse Navigator [phone]

## 2021-10-04 NOTE — Plan of Care (Signed)
Problem: Clinical Measurements: °Goal: Will remain free from infection °Outcome: Completed/Met °  °

## 2021-10-04 NOTE — Progress Notes (Signed)
Pt was having CP 10/10, L chest stabbing pain, ECG done and WNL. MD notified and aware. Pt was also ambulating with PT a significant distance yesterday.  Pt's Lasix has been on hold as well due to worsening kidney function.  Pt was put on O2,  given 2 nitro with minimal relief and BP was still WNL.  Pt was given Hydrocodone shortly after this and this started helping his pain.  At 4:07 am pt's CP went away.  Will continue to monitor, thanks Glenna Fellows.

## 2021-10-04 NOTE — Progress Notes (Signed)
Progress Note  Patient Name: Lawrence Rose Date of Encounter: 10/04/2021  [facility] Cardiologist: Werner Lean, MD   Subjective   Chest pain earlier now resolved.  No dyspnea.  Inpatient Medications    Scheduled Meds:  amiodarone  200 mg Oral Daily   apixaban  5 mg Oral BID   clopidogrel  75 mg Oral q morning   gabapentin  600 mg Oral BID   insulin aspart  0-9 Units Subcutaneous TID WC   latanoprost  1 drop Both Eyes QHS   oxyCODONE  10 mg Oral Q12H   pantoprazole  40 mg Oral BID   polyethylene glycol  17 g Oral Daily   rosuvastatin  20 mg Oral Daily   senna-docusate  1 tablet Oral Daily   sodium chloride flush  3 mL Intravenous Q12H   Continuous Infusions:  PRN Meds: acetaminophen **OR** acetaminophen, albuterol, alum & mag hydroxide-simeth, bisacodyl, HYDROcodone-acetaminophen, morphine injection, neomycin-bacitracin-polymyxin, ondansetron **OR** ondansetron (ZOFRAN) IV, polyvinyl alcohol   Vital Signs    Vitals:   10/04/21 0329 10/04/21 0500 10/04/21 0700 10/04/21 0836  BP: 139/69  (!) 128/58 (!) 128/57  Pulse: 80  76 77  Resp:   20   Temp:    98 F (36.7 C)  TempSrc:   Oral Oral  SpO2:   100% 100%  Weight:  123.5 kg    Height:        Intake/Output Summary (Last 24 hours) at 10/04/2021 0924 Last data filed at 10/04/2021 0814 Gross per 24 hour  Intake 1282.07 ml  Output 2475 ml  Net -1192.93 ml   Last 3 Weights 10/04/2021 10/03/2021 10/01/2021  Weight (lbs) 272 lb 4.3 oz 272 lb 4.8 oz 276 lb 9.6 oz  Weight (kg) 123.5 kg 123.514 kg 125.465 kg      Telemetry    Sinus - Personally Reviewed  ECG    October 04, 2021-normal sinus rhythm with no ST changes.- Personally Reviewed  Physical Exam   GEN: No acute distress.   Neck: No JVD Cardiac: RRR, no murmurs, rubs, or gallops.  Respiratory: Clear to auscultation bilaterally. GI: Soft, nontender, non-distended  MS: No edema Neuro:  Nonfocal  Psych: Normal affect   Labs     High Sensitivity Troponin:   Recent Labs  Lab 09/25/21 0207 09/25/21 0415 10/04/21 0358 10/04/21 0612  TROPONINIHS 13 17 13 12      Chemistry Recent Labs  Lab 09/29/21 0251 09/30/21 0151 10/01/21 0853 10/02/21 0215 10/03/21 0354 10/04/21 0116  NA 136 141 135 138 141 138  K 4.1 4.1 3.6 3.6 4.5 4.1  CL 96* 99 99 101 104 102  CO2 34* 25 30 30 30 29   GLUCOSE 112* 125* 175* 143* 123* 151*  BUN 42* 42* 39* 37* 25* 22  CREATININE 2.35* 2.40* 2.14* 2.08* 1.83* 1.94*  CALCIUM 8.7* 8.8* 8.1* 8.3* 9.2 8.6*  MG  --  2.0  --   --  2.3  --   PROT  --  7.1  --   --   --   --   ALBUMIN 2.8* 2.8* 2.3*  --   --   --   AST  --  30  --   --   --   --   ALT  --  21  --   --   --   --   ALKPHOS  --  64  --   --   --   --   BILITOT  --  1.0  --   --   --   --   GFRNONAA 30* 29* 33* 34* 40* 37*  ANIONGAP 6 17* 6 7 7 7      Hematology Recent Labs  Lab 10/02/21 0215 10/03/21 0354 10/04/21 0116  WBC 21.5* 19.1* 13.0*  RBC 3.28* 3.32* 3.23*  HGB 8.9* 8.8* 8.7*  HCT 29.1* 29.6* 28.4*  MCV 88.7 89.2 87.9  MCH 27.1 26.5 26.9  MCHC 30.6 29.7* 30.6  RDW 15.2 15.3 15.3  PLT 230 253 234    BNP Recent Labs  Lab 10/04/21 0358  BNP 184.7*      Cardiac Studies   09/27/21  1. Left ventricular ejection fraction, by estimation, is 60 to 65%. The  left ventricle has normal function. The left ventricle has no regional  wall motion abnormalities. There is mild left ventricular hypertrophy.  Left ventricular diastolic parameters  were normal.   2. Right ventricular systolic function is normal. The right ventricular  size is normal.   3. Left atrial size was moderately dilated.   4. Right atrial size was moderately dilated.   5. The pericardial effusion is lateral to the left ventricle.   6. The mitral valve is degenerative. No evidence of mitral valve  regurgitation. No evidence of mitral stenosis. Moderate mitral annular  calcification.   7. Post 29 mm Medtronic supra annular Core  valve stable gradients since  01/19/21 trivial AR likely PVL . The aortic valve has been  repaired/replaced. Aortic valve regurgitation is trivial. No aortic  stenosis is present.   8. The inferior vena cava is normal in size with greater than 50%  respiratory variability, suggesting right atrial pressure of 3 mmHg.   Patient Profile     65 y.o. male  with a hx of HTN, HFpEF, severe AS s/p TAVR 08/2020, pAF/AFL, DM, CKDIII, R TKA, OSA on CPAP who is being seen 09/26/2021 for the evaluation of decompensated HF.  Assessment & Plan    1 chest pain-troponins are normal.  Electrocardiogram shows no ST changes.  Patient describes the pain as worse with palpation and also inspiration.  Likely musculoskeletal.  Will not pursue further ischemia evaluation.  2 acute on chronic diastolic congestive heart failure-he appears to be euvolemic on examination.  Would resume Lasix 40 mg daily at discharge.  He will need potassium and renal function checked 1 week later.  3 hypertension-blood pressure trending up.  Will resume metoprolol 50 mg twice daily.  Would not resume hydralazine.  We will consider resuming lisinopril as an outpatient.  4 acute on chronic kidney disease-renal function has improved since admission.  5 paroxysmal atrial fibrillation-patient remains in sinus rhythm.  We will continue amiodarone at present dose.  Continue apixaban.  Resume metoprolol.  6 status post TAVR-follow-up echocardiogram shows normally functioning valve.  7 sepsis secondary to Pseudomonas UTI-antibiotics per primary care.  For questions or updates, please contact [facility] Please consult [email or web address] for contact info under        Signed, 14/08/2021, MD  10/04/2021, 9:24 AM

## 2021-10-05 ENCOUNTER — Encounter (HOSPITAL_COMMUNITY): Payer: Medicare HMO

## 2021-10-13 ENCOUNTER — Ambulatory Visit (HOSPITAL_COMMUNITY)
Admit: 2021-10-13 | Discharge: 2021-10-13 | Disposition: A | Discharge status: Home / Self Care | Payer: Medicare HMO | Attending: Cardiology | Admitting: Cardiology

## 2021-10-13 ENCOUNTER — Other Ambulatory Visit: Payer: Self-pay

## 2021-10-13 ENCOUNTER — Encounter (HOSPITAL_COMMUNITY): Payer: Self-pay

## 2021-10-13 VITALS — BP 162/82 | HR 58 | Wt 284.2 lb

## 2021-10-13 DIAGNOSIS — I480 Paroxysmal atrial fibrillation: Secondary | ICD-10-CM | POA: Insufficient documentation

## 2021-10-13 DIAGNOSIS — Z79899 Other long term (current) drug therapy: Secondary | ICD-10-CM | POA: Insufficient documentation

## 2021-10-13 DIAGNOSIS — Z952 Presence of prosthetic heart valve: Secondary | ICD-10-CM | POA: Diagnosis not present

## 2021-10-13 DIAGNOSIS — I10 Essential (primary) hypertension: Secondary | ICD-10-CM | POA: Diagnosis not present

## 2021-10-13 DIAGNOSIS — I130 Hypertensive heart and chronic kidney disease with heart failure and stage 1 through stage 4 chronic kidney disease, or unspecified chronic kidney disease: Secondary | ICD-10-CM | POA: Diagnosis not present

## 2021-10-13 DIAGNOSIS — Z87440 Personal history of urinary (tract) infections: Secondary | ICD-10-CM | POA: Diagnosis not present

## 2021-10-13 DIAGNOSIS — N179 Acute kidney failure, unspecified: Secondary | ICD-10-CM | POA: Diagnosis not present

## 2021-10-13 DIAGNOSIS — Z7901 Long term (current) use of anticoagulants: Secondary | ICD-10-CM | POA: Diagnosis not present

## 2021-10-13 DIAGNOSIS — I5032 Chronic diastolic (congestive) heart failure: Secondary | ICD-10-CM | POA: Diagnosis not present

## 2021-10-13 DIAGNOSIS — M1712 Unilateral primary osteoarthritis, left knee: Secondary | ICD-10-CM | POA: Insufficient documentation

## 2021-10-13 DIAGNOSIS — N1831 Chronic kidney disease, stage 3a: Secondary | ICD-10-CM | POA: Insufficient documentation

## 2021-10-13 DIAGNOSIS — I272 Pulmonary hypertension, unspecified: Secondary | ICD-10-CM | POA: Diagnosis not present

## 2021-10-13 DIAGNOSIS — I3481 Nonrheumatic mitral (valve) annulus calcification: Secondary | ICD-10-CM | POA: Diagnosis not present

## 2021-10-13 DIAGNOSIS — G4733 Obstructive sleep apnea (adult) (pediatric): Secondary | ICD-10-CM | POA: Insufficient documentation

## 2021-10-13 LAB — BASIC METABOLIC PANEL
Anion gap: 9 (ref 5–15)
BUN: 26 mg/dL — ABNORMAL HIGH (ref 8–23)
CO2: 32 mmol/L (ref 22–32)
Calcium: 9.1 mg/dL (ref 8.9–10.3)
Chloride: 98 mmol/L (ref 98–111)
Creatinine, Ser: 1.57 mg/dL — ABNORMAL HIGH (ref 0.61–1.24)
GFR, Estimated: 48 mL/min — ABNORMAL LOW (ref >60)
Glucose, Bld: 135 mg/dL — ABNORMAL HIGH (ref 70–99)
Potassium: 3.9 mmol/L (ref 3.5–5.1)
Sodium: 139 mmol/L (ref 135–145)

## 2021-10-13 MED ORDER — HYDRALAZINE HCL 25 MG PO TABS: 50.0000 mg | ORAL_TABLET | Freq: Three times a day (TID) | ORAL | 2 refills | Status: DC

## 2021-10-13 NOTE — Patient Instructions (Signed)
Great to see you today! Please increase Hydralazine to 50 mg three times daily. Follow up with Cardiology as previously scheduled. We did labwork today--you will only hear from [place] is something is abnormal--no news is good news!

## 2021-10-13 NOTE — Progress Notes (Signed)
HEART & VASCULAR TRANSITION OF CARE CONSULT NOTE     Referring Physician: Dr. Karleen Hampshire  Primary Care: [name], [name], MD Primary Cardiologist: Werner Lean, MD   HPI: Referred to clinic by Dr. Karleen Hampshire for heart failure consultation.   65 y/o male w/ chronic diastolic heart failure, h/o severe AS s/p TAVR, PAF/AFL, systemic HTN, pulmonary HTN, OSA and stage IIIa CKD.  Underwent TAVR 11/21. Pre-procedure cath showed no CAD.    Recently admitted 47/63 for a/c diastolic heart failure. Hospital course also notable for the development of UTI sepsis with associated AKI and acute metabolic encephalopathy, treated w/ antibiotics. UCx grew pseudomonas aeruginosa. BCx remained negative. His SCr bumped to 2.9 (baseline ~1.6). Home lisinopril held. AKI improved w/ treatment of sepsis/UTI. 2D echo showed normal LVEF, 55-60%, stable AoV prosthesis w/ trivial PVL. Normal RV. After diuresis w/ IV Lasix, he was transitioned to PO, 40 mg daily. Discharge wt 271 lb. Referred to TOC.   Presents today for f/u. Wt is up from discharge at 284 lb but he denies any significant wt gain on his home scales (he is noted to be wearing heavy clothing today). ReDs Clip only 33%.  BP elevated 162/82, despite taking his meds today.   Symptomatically, feels much improved. He remains limited physically by mainly left knee OA and is scheduled to undergo TKA in Feb. He denies any exertional dyspnea w/ ADLs. No resting dyspnea. Denies orthopnea/PND. No CP.   Reports good UOP w/ Lasix. Denies dysuria. No f/c.     Cardiac Testing   2D Echo 12/22  left ventricular ejection fraction, by estimation, is 60 to 65%. The left ventricle has normal function. The left ventricle has no regional wall motion abnormalities. There is mild left ventricular hypertrophy. Left ventricular diastolic parameters were normal. 1. 2. Right ventricular systolic function is normal. The right ventricular size is normal. 3. Left atrial  size was moderately dilated. 4. Right atrial size was moderately dilated. 5. The pericardial effusion is lateral to the left ventricle. The mitral valve is degenerative. No evidence of mitral valve regurgitation. No evidence of mitral stenosis. Moderate mitral annular calcification. 6. Post 29 mm Medtronic supra annular Core valve stable gradients since 01/19/21 trivial AR likely PVL . The aortic valve has been repaired/replaced. Aortic valve regurgitation is trivial. No aortic stenosis is present. 7. The inferior vena cava is normal in size with greater than 50% respiratory variability, suggesting right atrial pressure of 3 mmHg.  Review of Systems: [y] = yes, '[ ]'  = no   General: Weight gain '[ ]' ; Weight loss '[ ]' ; Anorexia '[ ]' ; Fatigue '[ ]' ; Fever '[ ]' ; Chills '[ ]' ; Weakness '[ ]'   Cardiac: Chest pain/pressure '[ ]' ; Resting SOB '[ ]' ; Exertional SOB '[ ]' ; Orthopnea '[ ]' ; Pedal Edema '[ ]' ; Palpitations '[ ]' ; Syncope '[ ]' ; Presyncope '[ ]' ; Paroxysmal nocturnal dyspnea'[ ]'   Pulmonary: Cough '[ ]' ; Wheezing'[ ]' ; Hemoptysis'[ ]' ; Sputum '[ ]' ; Snoring '[ ]'   GI: Vomiting'[ ]' ; Dysphagia'[ ]' ; Melena'[ ]' ; Hematochezia '[ ]' ; Heartburn'[ ]' ; Abdominal pain '[ ]' ; Constipation '[ ]' ; Diarrhea '[ ]' ; BRBPR '[ ]'   GU: Hematuria'[ ]' ; Dysuria '[ ]' ; Nocturia'[ ]'   Vascular: Pain in legs with walking '[ ]' ; Pain in feet with lying flat '[ ]' ; Non-healing sores '[ ]' ; Stroke '[ ]' ; TIA '[ ]' ; Slurred speech '[ ]' ;  Neuro: Headaches'[ ]' ; Vertigo'[ ]' ; Seizures'[ ]' ; Paresthesias'[ ]' ;Blurred vision '[ ]' ; Diplopia '[ ]' ; Vision changes '[ ]'   Ortho/Skin: Arthritis [  Y]; Joint pain [Y]; Muscle pain '[ ]' ; Joint swelling '[ ]' ; Back Pain '[ ]' ; Rash '[ ]'   Psych: Depression'[ ]' ; Anxiety'[ ]'   Heme: Bleeding problems '[ ]' ; Clotting disorders '[ ]' ; Anemia '[ ]'   Endocrine: Diabetes '[ ]' ; Thyroid dysfunction'[ ]'    Past Medical History:  Diagnosis Date   Acid reflux    Allergic arthritis, hand    per patient, both hands   Anemia    Apnea    Asthma    Carpal tunnel syndrome    per patient,  both hands   Chronic back pain    Chronic kidney disease    Chronic knee pain    Diabetes mellitus without complication ([place])    Dysrhythmia    GI bleeding 09/2020   Hypertension    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 Angelena Form and Dr. Cyndia Bent.    Severe aortic stenosis    Sleep apnea    wears CPAP every night    Current Outpatient Medications  Medication Sig Dispense Refill   albuterol (VENTOLIN HFA) 108 (90 Base) MCG/ACT inhaler Inhale 2 puffs into the lungs every 4 (four) hours as needed for wheezing or shortness of breath. 8 g 3   amiodarone (PACERONE) 200 MG tablet Take 1 tablet by mouth once daily (Patient taking differently: Take 200 mg by mouth daily.) 90 tablet 3   apixaban (ELIQUIS) 5 MG TABS tablet Take 1 tablet (5 mg total) by mouth 2 (two) times daily. 180 tablet 1   Blood Glucose Monitoring Suppl (FIFTY50 GLUCOSE METER 2.0) w/Device KIT 1 each by Other route in the morning, at noon, and at bedtime.     Cholecalciferol 25 MCG (1000 UT) tablet Take 1,000 Units by mouth at bedtime.      clopidogrel (PLAVIX) 75 MG tablet Take 75 mg by mouth every morning.     furosemide (LASIX) 40 MG tablet Take 1 tablet (40 mg total) by mouth daily. 90 tablet 2   gabapentin (NEURONTIN) 300 MG capsule Take 600 mg by mouth 2 (two) times daily.      hydrALAZINE (APRESOLINE) 25 MG tablet Take 1 tablet (25 mg total) by mouth every 8 (eight) hours. 180 tablet 2   HYDROcodone-acetaminophen (NORCO) 10-325 MG tablet Take 1 tablet by mouth every 6 (six) hours as needed for moderate pain.      Iron-Vitamins (GERITOL PO) Take 1 tablet by mouth daily.     latanoprost (XALATAN) 0.005 % ophthalmic solution Place 1 drop into both eyes at bedtime.      metoprolol tartrate (LOPRESSOR) 50 MG tablet Take 1 tablet (50 mg total) by mouth 2 (two) times daily. 180 tablet 3   Multiple Vitamin (MULTIVITAMIN WITH MINERALS)  TABS tablet Take 1 tablet by mouth at bedtime.      neomycin-bacitracin-polymyxin (NEOSPORIN) [phone] ointment Apply 1 application topically 2 (two) times daily as needed (itching).     oxyCODONE ER (XTAMPZA ER) 9 MG C12A Take 9 mg by mouth every 12 (twelve) hours.     pantoprazole (PROTONIX) 40 MG tablet Take 40 mg by mouth 2 (two) times daily.     PRESCRIPTION MEDICATION Inhale into the lungs at bedtime. CPAP     rosuvastatin (CRESTOR) 20 MG tablet Take 20 mg by mouth daily.      senna-docusate (SENOKOT-S) 8.6-50 MG tablet Take 1 tablet by mouth daily.     polyethylene glycol (MIRALAX /  GLYCOLAX) 17 g packet Take 17 g by mouth daily. (Patient not taking: Reported on 10/13/2021) 14 each 0   No current facility-administered medications for this encounter.    Allergies  Allergen Reactions   Cocoa Hives   Sulfamethoxazole-Trimethoprim Anaphylaxis   Chocolate Hives   Citrus Hives    Reaction to oranges and orange juice   Fish Allergy Hives and Swelling    Throat swelling   Shellfish Allergy Hives and Swelling    Throat swelling   Sulfa Antibiotics Hives and Swelling    Throat swelling   Tetracycline Swelling      Social History   Socioeconomic History   Marital status: Single    Spouse name: Not on file   Number of children: 0   Years of education: Not on file   Highest education level: Not on file  Occupational History   Occupation: retired  Tobacco Use   Smoking status: Former    Packs/day: 1.00    Years: 15.00    Pack years: 15.00    Types: Cigarettes    Quit date: 10/17/1988    Years since quitting: 33.0   Smokeless tobacco: Never   Tobacco comments:    per patient quit over 30 years ago  Vaping Use   Vaping Use: Never used  Substance and Sexual Activity   Alcohol use: Not Currently   Drug use: Not Currently   Sexual activity: Not on file  Other Topics Concern   Not on file  Social History Narrative   Not on file   Social Determinants of Health    Financial Resource Strain: Medium Risk   Difficulty of Paying Living Expenses: Somewhat hard  Food Insecurity: No Food Insecurity   Worried About Charity fundraiser in the Last Year: Never true   Ran Out of Food in the Last Year: Never true  Transportation Needs: No Transportation Needs   Lack of Transportation (Medical): No   Lack of Transportation (Non-Medical): No  Physical Activity: Not on file  Stress: Not on file  Social Connections: Not on file  Intimate Partner Violence: Not on file     No family history on file.  Vitals:   10/13/21 1406  BP: (!) 162/82  Pulse: (!) 58  SpO2: 96%  Weight: 128.9 kg (284 lb 3.2 oz)    PHYSICAL EXAM: ReDs Clip 33%  General:  Well appearing, moderately obese, in WC. No respiratory difficulty HEENT: normal Neck: supple. no JVD. Carotids 2+ bilat; no bruits. No lymphadenopathy or thryomegaly appreciated. Cor: PMI nondisplaced. Regular rate & rhythm. No rubs, gallops or murmurs. Lungs: clear Abdomen: soft, nontender, nondistended. No hepatosplenomegaly. No bruits or masses. Good bowel sounds. Extremities: no cyanosis, clubbing, rash, edema Neuro: alert & oriented x 3, cranial nerves grossly intact. moves all 4 extremities w/o difficulty. Affect pleasant.  ECG: Not performed    ASSESSMENT & PLAN:  Chronic Diastolic Heart Failure - Echo EF 55-60%, RV normal  - Volume status ok. Euvolemic on exam. ReDs clip 33%  - NYHA Class II-III, mostly limited by left Knee OA - Continue Lasix 40 mg daily  - Continue ? blocker, Lopressor 50 mg daily (HR well controlled in 50s)  - Increase Hydralazine to 50 mg tid for better BP control  - would avoid SGLT2i w/ h/o recent pseudomonas UTI  - recommend continuation of daily wts and low sodium diet   2. H/o Severe AS - S/p TAVR 08/2020 - Most recent echo 12/22 showed stable AoV prosthesis  w/ trival PVL, stable gradients compared to prior study, mean gradient 15 mmHg   3. Stage IIIa CKD - recent  AKI during admit, 2/2 UTI/Sepsis, Scr bumped to 2.9 (baseline ~1.6) - AKI improved w/ tx, down to 1.9 by day of d/c - has been on daily lasix - repeat BMP today   4. Hypertension - moderately elevated, lisinopril recently held for AKI - increase hydralazine to 50 tid   - repeat BMP today   5. Pulmonary Hypertension - Felt combination WHO Group 2 + 3 - RVSPs improved on post TAVR echos - continue diuretics for chronic dHF - continue CPAP for OSA  6. Paroxysmal Atrial Fibrillation/Flutter - on amio/metoprolol, RRR on exam  - Eliquis for a/c     NYHA II-III GDMT  Diuretic- Lasix 40 mg daily  BB- Lopressor 50 mg daily  Ace/ARB/ARNI no (recent AKI w/ SCr >2.0) MRA no (recent AKI w/ SCr >2.0) SGLT2i h/o (h/o UTIs)     Referred to HFSW (PCP, Medications, Transportation, ETOH Abuse, Drug Abuse, Insurance, Museum/gallery curator ): No  Refer to Pharmacy: No  Refer to Home Health: No Refer to Advanced Heart Failure Clinic: No Refer to General Cardiology: Yes (followed by Dr. [name])   Follow up w/ cardiology. Has appt scheduled for next month.

## 2021-10-13 NOTE — Progress Notes (Signed)
ReDS Vest / Clip - 10/13/21 1400       ReDS Vest / Clip   Station Marker D    Ruler Value 37    ReDS Value Range Low volume    ReDS Actual Value 33

## 2021-11-04 NOTE — Progress Notes (Signed)
Cardiology Clinic Note   Patient Name: Lawrence Rose Date of Encounter: 11/05/2021  Primary Care Provider:  Patrecia Rose, Lawrence Grief, MD Primary Cardiologist:  Lawrence Lean, MD  Patient Profile    66 year old male with chronic diastolic heart failure, severe AS status post TAVR 11/21, (preprocedure cardiac catheterization did not reveal coronary artery disease), PAF/AFL, systemic hypertension, pulmonary hypertension, stage IIIa chronic kidney disease, and OSA.  Recent admission in December 2020 to for acute on chronic diastolic heart failure, UTI sepsis with associated AKI and acute metabolic encephalopathy.  Lisinopril was held.  Echo revealed normal LVEF of 55 to 60% with stable aortic valve prosthesis with trivial PVL.   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 Angelena Rose and Dr. Cyndia Rose.    Severe aortic stenosis    Sleep apnea    wears CPAP every night   Past Surgical History:  Procedure Laterality Date   BACK SURGERY     CARDIOVERSION  01/18/2021   CARDIOVERSION N/A 01/18/2021   Procedure: CARDIOVERSION;  Surgeon: Lawrence Lean, MD;  Location: [facility];  Service: Cardiovascular;  Laterality: N/A;   ESOPHAGOGASTRODUODENOSCOPY (EGD) WITH PROPOFOL N/A 10/01/2020   Procedure: ESOPHAGOGASTRODUODENOSCOPY (EGD) WITH PROPOFOL;  Surgeon: Lawrence Pole, MD;  Location: [facility];  Service: Endoscopy;  Laterality: N/A;   RIGHT/LEFT HEART CATH AND CORONARY ANGIOGRAPHY N/A 08/18/2020    Procedure: RIGHT/LEFT HEART CATH AND CORONARY ANGIOGRAPHY;  Surgeon: Lawrence Crome, MD;  Location: [place] [facility];  Service: Cardiovascular;  Laterality: N/A;   TEE WITHOUT CARDIOVERSION N/A 08/21/2020   Procedure: TRANSESOPHAGEAL ECHOCARDIOGRAM (TEE);  Surgeon: Lawrence Perla, MD;  Location: [facility] ENDOSCOPY;  Service: Cardiovascular;  Laterality: N/A;   TEE WITHOUT CARDIOVERSION N/A 09/01/2020   Procedure: TRANSESOPHAGEAL ECHOCARDIOGRAM (TEE);  Surgeon: Lawrence Blanks, MD;  Location: [place] [facility];  Service: Open Heart Surgery;  Laterality: N/A;   TRANSCATHETER AORTIC VALVE REPLACEMENT, TRANSFEMORAL N/A 09/01/2020   Procedure: TRANSCATHETER AORTIC VALVE REPLACEMENT, TRANSFEMORAL;  Surgeon: Lawrence Blanks, MD;  Location: [place] [facility];  Service: Open Heart Surgery;  Laterality: N/A;    Allergies  Allergies  Allergen Reactions   Cocoa Hives   Sulfamethoxazole-Trimethoprim Anaphylaxis   Chocolate Hives   Citrus Hives    Reaction to oranges and orange juice   Fish Allergy Hives and Swelling    Throat swelling   Shellfish Allergy Hives and Swelling    Throat swelling   Sulfa Antibiotics Hives and Swelling    Throat swelling   Tetracycline Swelling    History of Present Illness    We are seeing Lawrence Rose for ongoing assessment and management of chronic diastolic heart failure with history of hypertension and stage III chronic kidney disease with recent hospitalization in December 2022 as described above.  The patient was seen recently by Lawrence Henri, PA, on 10/13/2021 in  the [facility].  He gained approximately 13 pounds but denied any significant weight gain on his home scales.  He was slightly hypertensive with blood pressure 162/82 despite taking medications.    It was noted that he was limited physically due to left knee osteoarthritis and was scheduled to go undergo a TKA in February 2023.  Due to elevated blood pressure,  hydralazine was increased to 50 mg 3 times daily, and lisinopril was not restarted.  It was recommended that he not be on SGLT2 inhibitors due to history of UTIs.    He comes today sitting in a wheelchair, his brother Lawrence Rose is with him.  He denies any symptoms other than his inability to afford Eliquis which is causing him significant stress.  He is medically compliant.  He admits to dietary noncompliance over the holidays and recently around his birthday, eating out more.  This is caused some weight gain.  He is back to a committed low-sodium , low-carb diet.   Home Medications    Current Outpatient Medications  Medication Sig Dispense Refill   albuterol (VENTOLIN HFA) 108 (90 Base) MCG/ACT inhaler Inhale 2 puffs into the lungs every 4 (four) hours as needed for wheezing or shortness of breath. 8 g 3   amiodarone (PACERONE) 200 MG tablet Take 1 tablet by mouth once daily (Patient taking differently: Take 200 mg by mouth daily.) 90 tablet 3   apixaban (ELIQUIS) 5 MG TABS tablet Take 1 tablet (5 mg total) by mouth 2 (two) times daily. 180 tablet 1   Blood Glucose Monitoring Suppl (FIFTY50 GLUCOSE METER 2.0) w/Device KIT 1 each by Other route in the morning, at noon, and at bedtime.     Cholecalciferol 25 MCG (1000 UT) tablet Take 1,000 Units by mouth at bedtime.      clopidogrel (PLAVIX) 75 MG tablet Take 75 mg by mouth every morning.     furosemide (LASIX) 40 MG tablet Take 1 tablet (40 mg total) by mouth daily. 90 tablet 2   gabapentin (NEURONTIN) 300 MG capsule Take 600 mg by mouth 2 (two) times daily.      hydrALAZINE (APRESOLINE) 25 MG tablet Take 2 tablets (50 mg total) by mouth every 8 (eight) hours. 180 tablet 2   HYDROcodone-acetaminophen (NORCO) 10-325 MG tablet Take 1 tablet by mouth every 6 (six) hours as needed for moderate pain.      Iron-Vitamins (GERITOL PO) Take 1 tablet by mouth daily.     latanoprost (XALATAN) 0.005 % ophthalmic solution Place 1 drop into both eyes at bedtime.       metoprolol tartrate (LOPRESSOR) 50 MG tablet Take 1 tablet (50 mg total) by mouth 2 (two) times daily. 180 tablet 3   Multiple Vitamin (MULTIVITAMIN WITH MINERALS) TABS tablet Take 1 tablet by mouth at bedtime.      neomycin-bacitracin-polymyxin (NEOSPORIN) [phone] ointment Apply 1 application topically 2 (two) times daily as needed (itching).     oxyCODONE ER (XTAMPZA ER) 9 MG C12A Take 9 mg by mouth every 12 (twelve) hours.     pantoprazole (PROTONIX) 40 MG tablet Take 40 mg by mouth 2 (two) times daily.     polyethylene glycol (MIRALAX / GLYCOLAX) 17 g packet Take 17 g by mouth daily. 14 each 0   PRESCRIPTION MEDICATION Inhale into the lungs at bedtime. CPAP     rosuvastatin (CRESTOR) 20 MG tablet Take 20 mg by mouth daily.      senna-docusate (SENOKOT-S) 8.6-50 MG tablet Take 1  tablet by mouth daily.     No current facility-administered medications for this visit.     Family History    No family history on file. has no family status information on file.   Social History    Social History   Socioeconomic History   Marital status: Single    Spouse name: Not on file   Number of children: 0   Years of education: Not on file   Highest education level: Not on file  Occupational History   Occupation: retired  Tobacco Use   Smoking status: Former    Packs/day: 1.00    Years: 15.00    Pack years: 15.00    Types: Cigarettes    Quit date: 10/17/1988    Years since quitting: 33.0   Smokeless tobacco: Never   Tobacco comments:    per patient quit over 30 years ago  Vaping Use   Vaping Use: Never used  Substance and Sexual Activity   Alcohol use: Not Currently   Drug use: Not Currently   Sexual activity: Not on file  Other Topics Concern   Not on file  Social History Narrative   Not on file   Social Determinants of Health   Financial Resource Strain: Medium Risk   Difficulty of Paying Living Expenses: Somewhat hard  Food Insecurity: No Food Insecurity   Worried  About Charity fundraiser in the Last Year: Never true   Ran Out of Food in the Last Year: Never true  Transportation Needs: No Transportation Needs   Lack of Transportation (Medical): No   Lack of Transportation (Non-Medical): No  Physical Activity: Not on file  Stress: Not on file  Social Connections: Not on file  Intimate Partner Violence: Not on file     Review of Systems    General:  No chills, fever, night sweats or weight changes.  Cardiovascular:  No chest pain, dyspnea on exertion, 2+ shiny bilateral pretibial edema, orthopnea, palpitations, paroxysmal nocturnal dyspnea. Dermatological: No rash, lesions/masses Respiratory: No cough, dyspnea Urologic: No hematuria, dysuria Abdominal:   No nausea, vomiting, diarrhea, bright red blood per rectum, melena, or hematemesis Neurologic:  No visual changes, wkns, changes in mental status. All other systems reviewed and are otherwise negative except as noted above.     Physical Exam    VS:  BP 126/62 (BP Location: Left Arm)    Pulse (!) 51    Ht 5' 11" (1.803 m)    Wt 295 lb (133.8 kg)    SpO2 98%    BMI 41.14 kg/m  , BMI Body mass index is 41.14 kg/m.     GEN: Well nourished, well developed, in no acute distress. HEENT: normal.  Wearing sunglasses due to glare Neck: Supple, no JVD, carotid bruits, or masses. Cardiac: RRR, 1/6 systolic murmurs, heard best right sternal border, no rubs, or gallops. No clubbing, cyanosis, 2+ pretibial shiny edema.  Radials/DP/PT 2+ and equal bilaterally.  Respiratory:  Respirations regular and unlabored, clear to auscultation bilaterally. GI: Soft, nontender, nondistended, BS + x 4. MS: no deformity or atrophy. Skin: warm and dry, no rash. Neuro:  Strength and sensation are intact. Psych: Normal affect.  Accessory Clinical Findings    ECG personally reviewed by me today-normal sinus rhythm, heart rate 51 bpm- No acute changes  Lab Results  Component Value Date   WBC 13.0 (H) 10/04/2021    HGB 8.7 (L) 10/04/2021   HCT 28.4 (L) 10/04/2021   MCV 87.9 10/04/2021  PLT 234 10/04/2021   Lab Results  Component Value Date   CREATININE 1.57 (H) 10/13/2021   BUN 26 (H) 10/13/2021   NA 139 10/13/2021   K 3.9 10/13/2021   CL 98 10/13/2021   CO2 32 10/13/2021   Lab Results  Component Value Date   ALT 21 09/30/2021   AST 30 09/30/2021   ALKPHOS 64 09/30/2021   BILITOT 1.0 09/30/2021   Lab Results  Component Value Date   CHOL 127 08/17/2020   HDL 39 (L) 08/17/2020   LDLCALC 74 08/17/2020   TRIG 69 08/17/2020   CHOLHDL 3.3 08/17/2020    Lab Results  Component Value Date   HGBA1C 5.2 09/26/2021   CHADS VASC Score: 4 Age, CHF, HTN, DM.  Review of Prior Studies: 2D Echo 12/22   left ventricular ejection fraction, by estimation, is 60 to 65%. The left ventricle has normal function. The left ventricle has no regional wall motion abnormalities. There is mild left ventricular hypertrophy. Left ventricular diastolic parameters were normal. 1. 2. Right ventricular systolic function is normal. The right ventricular size is normal. 3. Left atrial size was moderately dilated. 4. Right atrial size was moderately dilated. 5. The pericardial effusion is lateral to the left ventricle. The mitral valve is degenerative. No evidence of mitral valve regurgitation. No evidence of mitral stenosis. Moderate mitral annular calcification. 6. Post 29 mm Medtronic supra annular Core valve stable gradients since 01/19/21 trivial AR likely PVL . The aortic valve has been repaired/replaced. Aortic valve regurgitation is trivial. No aortic stenosis is present. 7. The inferior vena cava is normal in size with greater than 50% respiratory variability, suggesting right atrial pressure of 3 mmHg.    Assessment & Plan   1.  Chronic diastolic CHF: Volume overload is noted with weight gain and lower extremity edema, without associated symptoms of PND, orthopnea, or abdominal distention with early  satiety.  He admits to overeating over the last month especially during the holidays and around his birthday.  He has gained about 15 pounds.  He does have some lower extremity edema in the dependent position but has not yet taken his antihypertensives or diuretics.    I have counseled him on low-sodium diet, keeping his feet elevated, and weight loss.  It is imperative that he do his best to lose weight in order to maintain optimal health and prevent recurrence of CHF exacerbations.  We will continue to have him be followed in the [facility].  No changes in his medication regimen.  He will need follow-up labs in 3 months to include a BMET, TSH, and CBC.  2.  Atrial flutter: He is currently in sinus rhythm, sinus bradycardia.  He remains on amiodarone along with Eliquis 5 mg twice daily, metoprolol 50 mg twice daily.  CHA2DS2-VASc score of 4.  He is having trouble affording Eliquis.  I am giving him samples and following up on paperwork which he sent to help with medication cost assistance with a hemoglobin of 8.7 on 10/04/2021.  He is following up with his PCP for management.  He denies any melena or hemoptysis.  3.  Hypertension: Blood pressure was not controlled based upon his initial vital signs on arrival to clinic.  I did recheck his blood pressure 124/62.  No changes in his medication regimen.  4.  Chronic dependent edema: I have spoken with him about support hose as well as low-sodium diet and keeping legs elevated is much as possible.  He  verbalizes understanding he is given a "Salty 6 Southern Version" and advised to weigh himself daily to avoid significant volume retention associated with diastolic CHF.  5.  Chronic kidney disease: Patient has 1 kidney, most recent creatinine on 12/14/2020 1.67.  Being followed by nephrology and PCP.  No ACE, ARB, ARNI, due to his status.  Current medicines are reviewed at length with the patient today.  I have spent 25 min's  dedicated to  the care of this patient on the date of this encounter to include pre-visit review of records, assessment, management and diagnostic testing,with shared decision making.   Signed, Phill Myron. West Pugh, ANP, [facility]   11/05/2021 8:42 AM    [facility] [address] Office [phone] Fax [phone]  Notice: This dictation was prepared with Dragon dictation along with smaller phrase technology. Any transcriptional errors that result from this process are unintentional and may not be corrected upon review.

## 2021-11-05 ENCOUNTER — Encounter: Payer: Self-pay | Admitting: Adult Health

## 2021-11-05 ENCOUNTER — Ambulatory Visit: Payer: Medicare HMO | Admitting: Adult Health

## 2021-11-05 ENCOUNTER — Other Ambulatory Visit: Payer: Self-pay

## 2021-11-05 VITALS — BP 126/62 | HR 51 | Ht 71.0 in | Wt 295.0 lb

## 2021-11-05 DIAGNOSIS — I10 Essential (primary) hypertension: Secondary | ICD-10-CM | POA: Diagnosis not present

## 2021-11-05 DIAGNOSIS — Z952 Presence of prosthetic heart valve: Secondary | ICD-10-CM

## 2021-11-05 DIAGNOSIS — I5032 Chronic diastolic (congestive) heart failure: Secondary | ICD-10-CM

## 2021-11-05 DIAGNOSIS — N1832 Chronic kidney disease, stage 3b: Secondary | ICD-10-CM

## 2021-11-05 DIAGNOSIS — I4892 Unspecified atrial flutter: Secondary | ICD-10-CM | POA: Diagnosis not present

## 2021-11-05 MED ORDER — APIXABAN 5 MG PO TABS: 5.0000 mg | ORAL_TABLET | Freq: Two times a day (BID) | ORAL | 0 refills | Status: DC

## 2021-11-05 NOTE — Patient Instructions (Signed)
Medication Instructions:  No changes *If you need a refill on your cardiac medications before your next appointment, please call your pharmacy*   Lab Work: No Labs If you have labs (blood work) drawn today and your tests are completely normal, you will receive your results only by: MyChart Message (if you have MyChart) OR A paper copy in the mail If you have any lab test that is abnormal or we need to change your treatment, we will call you to review the results.   Testing/Procedures: No Testing   Follow-Up: At [facility], you and your health needs are our priority.  As part of our continuing mission to provide you with exceptional heart care, we have created designated Provider Care Teams.  These Care Teams include your primary Cardiologist (physician) and Advanced Practice Providers (APPs -  Physician Assistants and Nurse Practitioners) who all work together to provide you with the care you need, when you need it.  We recommend signing up for the patient portal called "MyChart".  Sign up information is provided on this After Visit Summary.  MyChart is used to connect with patients for Virtual Visits (Telemedicine).  Patients are able to view lab/test results, encounter notes, upcoming appointments, etc.  Non-urgent messages can be sent to your provider as well.   To learn more about what you can do with MyChart, go to [email or web address].    Your next appointment:   6 month(s)  The format for your next appointment:   In Person  Provider:   Christell Constant, MD

## 2021-11-09 ENCOUNTER — Other Ambulatory Visit: Payer: Self-pay | Admitting: Physician Assistant

## 2021-11-16 NOTE — Progress Notes (Signed)
Cardiology Office Note:    Date:  11/17/2021   ID:  Lawrence Rose, DOB [birth year], MRN [id number]  Goes by Lawrence Rose in the community   PCP:  [name], [name], MD  [facility] Cardiologist:  Werner Lean, MD  [place] Electrophysiologist:  None   CC: Post TAVR Follow Up  History of Present Illness:    Lawrence Rose is a 66 y.o. male with a hx of HTN, HLD, Morbid Obesity, OSA on CPAP, CKD Stage III, Atrial flutter on eliquis with recent GI bleed initially since inpatient with severe aortic stenosis s/p 09/01/20 TAVR. Post TAVR was diagnosed with AFl and put on eliquis and amiodarone.  This leg to GI bleed with 10/02/20 hospitalization.  Seen 10/29/21 after interval hospitalization.  ACEi held at that time for   Patient notes that he is doing relatively ok.  Since last visit notes  That he doesn't like the flutters.  He feels his heart racing at night and it is scary.   There are no interval hospital/ED visit.    No chest pain or pressure .  No SOB/DOE and no PND/Orthopnea.  Notes 1 lbs weight gain  Still has bilateral LE edema.  Ambulatory blood pressure not done.   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 Angelena Form and Dr. Cyndia Bent.    Severe aortic stenosis    Sleep apnea    wears CPAP every night    Past Surgical History:  Procedure Laterality Date   BACK SURGERY     CARDIOVERSION  01/18/2021   CARDIOVERSION N/A 01/18/2021   Procedure: CARDIOVERSION;  Surgeon: Werner Lean, MD;  Location: [facility]  ENDOSCOPY;  Service: Cardiovascular;  Laterality: N/A;   ESOPHAGOGASTRODUODENOSCOPY (EGD) WITH PROPOFOL N/A 10/01/2020   Procedure: ESOPHAGOGASTRODUODENOSCOPY (EGD) WITH PROPOFOL;  Surgeon: [name], MD;  Location: [place] ENDOSCOPY;  Service: Endoscopy;  Laterality: N/A;   RIGHT/LEFT HEART CATH AND CORONARY ANGIOGRAPHY N/A 08/18/2020   Procedure: RIGHT/LEFT HEART CATH AND CORONARY ANGIOGRAPHY;  Surgeon: [name], MD;  Location: [place] CV LAB;  Service: Cardiovascular;  Laterality: N/A;   TEE WITHOUT CARDIOVERSION N/A 08/21/2020   Procedure: TRANSESOPHAGEAL ECHOCARDIOGRAM (TEE);  Surgeon: [name], MD;  Location: [facility] ENDOSCOPY;  Service: Cardiovascular;  Laterality: N/A;   TEE WITHOUT CARDIOVERSION N/A 09/01/2020   Procedure: TRANSESOPHAGEAL ECHOCARDIOGRAM (TEE);  Surgeon: [name], MD;  Location: [place] CV LAB;  Service: Open Heart Surgery;  Laterality: N/A;   TRANSCATHETER AORTIC VALVE REPLACEMENT, TRANSFEMORAL N/A 09/01/2020   Procedure: TRANSCATHETER AORTIC VALVE REPLACEMENT, TRANSFEMORAL;  Surgeon: [name], MD;  Location: [place] CV LAB;  Service: Open Heart Surgery;  Laterality: N/A;    Current Medications: Current Meds  Medication Sig   albuterol (VENTOLIN HFA) 108 (90 Base) MCG/ACT inhaler Inhale 2 puffs into the lungs every 4 (four) hours as needed for wheezing or shortness of breath.  amiodarone (PACERONE) 200 MG tablet Take 1 tablet by mouth once daily   apixaban (ELIQUIS) 5 MG TABS tablet Take 1 tablet (5 mg total) by mouth 2 (two) times daily.   Blood Glucose Monitoring Suppl (FIFTY50 GLUCOSE METER 2.0) w/Device KIT 1 each by Other route in the morning, at noon, and at bedtime.   Cholecalciferol 25 MCG (1000 UT) tablet Take 1,000 Units by mouth at bedtime.    furosemide (LASIX) 40 MG tablet Take 1 tablet (40 mg total) by mouth daily.   gabapentin (NEURONTIN) 300 MG capsule Take 600 mg by mouth 2 (two) times daily.     hydrALAZINE (APRESOLINE) 25 MG tablet Take 2 tablets (50 mg total) by mouth every 8 (eight) hours.   HYDROcodone-acetaminophen (NORCO) 10-325 MG tablet Take 1 tablet by mouth every 6 (six) hours as needed for moderate pain.    Iron-Vitamins (GERITOL PO) Take 1 tablet by mouth daily.   latanoprost (XALATAN) 0.005 % ophthalmic solution Place 1 drop into both eyes at bedtime.    metoprolol tartrate (LOPRESSOR) 50 MG tablet Take 1 tablet (50 mg total) by mouth 2 (two) times daily.   Multiple Vitamin (MULTIVITAMIN WITH MINERALS) TABS tablet Take 1 tablet by mouth at bedtime.    neomycin-bacitracin-polymyxin (NEOSPORIN) [phone] ointment Apply 1 application topically 2 (two) times daily as needed (itching).   oxyCODONE ER (XTAMPZA ER) 9 MG C12A Take 9 mg by mouth every 12 (twelve) hours.   pantoprazole (PROTONIX) 40 MG tablet Take 40 mg by mouth 2 (two) times daily.   polyethylene glycol (MIRALAX / GLYCOLAX) 17 g packet Take 17 g by mouth daily.   PRESCRIPTION MEDICATION Inhale into the lungs at bedtime. CPAP   rosuvastatin (CRESTOR) 20 MG tablet Take 20 mg by mouth daily.    senna-docusate (SENOKOT-S) 8.6-50 MG tablet Take 1 tablet by mouth daily.   [DISCONTINUED] apixaban (ELIQUIS) 5 MG TABS tablet Take 1 tablet (5 mg total) by mouth 2 (two) times daily.   [DISCONTINUED] clopidogrel (PLAVIX) 75 MG tablet Take 1 tablet by mouth once daily with breakfast     Allergies:   Cocoa, Sulfamethoxazole-trimethoprim, Chocolate, Citrus, Fish allergy, Shellfish allergy, Sulfa antibiotics, and Tetracycline   Social History   Socioeconomic History   Marital status: Single    Spouse name: Not on file   Number of children: 0   Years of education: Not on file   Highest education level: Not on file  Occupational History   Occupation: retired  Tobacco Use   Smoking status: Former    Packs/day: 1.00    Years: 15.00    Pack years: 15.00    Types: Cigarettes    Quit date: 10/17/1988    Years since quitting:  33.1   Smokeless tobacco: Never   Tobacco comments:    per patient quit over 30 years ago  Vaping Use   Vaping Use: Never used  Substance and Sexual Activity   Alcohol use: Not Currently   Drug use: Not Currently   Sexual activity: Not on file  Other Topics Concern   Not on file  Social History Narrative   Not on file   Social Determinants of Health   Financial Resource Strain: Medium Risk   Difficulty of Paying Living Expenses: Somewhat hard  Food Insecurity: No Food Insecurity   Worried About Running Out of Food in the Last Year: Never true   Ran Out of Food in the Last Year: Never true  Transportation Needs: No Transportation Needs  Lack of Transportation (Medical): No   Lack of Transportation (Non-Medical): No  Physical Activity: Not on file  Stress: Not on file  Social Connections: Not on file    Social:  Dollene Cleveland always comes with his friend, Shamea's Cousin  Family History: History of coronary artery disease notable for no members. History of heart failure notable for no members. History of arrhythmia notable for no members.  ROS:   Please see the history of present illness.    All other systems reviewed and are negative.  EKGs/Labs/Other Studies Reviewed:    The following studies were reviewed today:  EKG:   12/24/2020: AFl 114  10/29/20: Atrial Flutter rate 102 10/14/20  Atrial Flutter heart rate 98  Transthoracic Echocardiogram:  Echo 09/30/20 IMPRESSIONS  1. Left ventricular ejection fraction, by estimation, is 55 to 60%. The  left ventricle has normal function. The left ventricle has no regional  wall motion abnormalities. Left ventricular diastolic parameters are  indeterminate.   2. Right ventricular systolic function is normal. The right ventricular  size is normal. There is normal pulmonary artery systolic pressure. The  estimated right ventricular systolic pressure is 97.7 mmHg.   3. The mitral valve is normal in structure. Trivial mitral  valve  regurgitation. No evidence of mitral stenosis.   4. The aortic valve has been repaired/replaced. Perivalvular Aortic valve  regurgitation is not visualized. No aortic stenosis is present. There is a  29 mm CoreValve-Evolut Pro prosthetic (TAVR) valve present in the aortic  position. Procedure Date:  09/01/2020. Echo findings are consistent with normal structure and  function of the aortic valve prosthesis. Aortic valve area, by VTI  measures 2.40 cm. Aortic valve mean gradient measures 7.0 mmHg. Aortic  valve Vmax measures 1.88 m/s. Diminsionless  index 0.49.   5. The inferior vena cava is dilated in size with <50% respiratory  variability, suggesting right atrial pressure of 15 mmHg.    Transesophageal Echocardiogram: Date:08/21/2020 Results: SIZING IMPRESSIONS   1. Severe AS (mean gradient 42 mmHg and peak velocity of 4.2 m/s); mild  AI.   2. Left ventricular ejection fraction, by estimation, is 70 to 75%. The  left ventricle has hyperdynamic function. There is severe left ventricular  hypertrophy.   3. Right ventricular systolic function is normal. The right ventricular  size is normal.   4. Left atrial size was moderately dilated. No left atrial/left atrial  appendage thrombus was detected.   5. The mitral valve is normal in structure. Trivial mitral valve  regurgitation.   6. The aortic valve is tricuspid. Aortic valve regurgitation is mild.  Severe aortic valve stenosis.   7. There is Moderate (Grade III) plaque involving the descending aorta.  Sizing Measurements for Watchman Flex: 45 degree: 17 mm, Depth of 27 mm 90 18 mm Depth of 21 mm 135 26 mm, Depth of 30 mm 0 degree not done  Cardiac CT: Date: 08/19/20 Results: FINDINGS: Aortic Valve: Calcium score 1645 Tri leaflet with restricted motion   Aorta: No aneurysm moderate calcific atherosclerosis normal arch vessels   Sino-tubular Junction: 25 mm   Ascending Thoracic Aorta: 33 mm   Aortic Arch: 25  mm   Descending Thoracic Aorta: 25 mm   Sinus of Valsalva Measurements:   Non-coronary: 30.8 mm   Right - coronary: 28.2 mm   Left - coronary: 31.3 mm   Coronary Artery Height above Annulus:   Left Main: 11.1 mm above annulus   Right Coronary: 15.3 mm above annulus  Virtual Basal Annulus Measurements:   Maximum/Minimum Diameter: 27.4 mm x 22.8 mm   Perimeter: 82 mm   Area: 505 mm2   Coronary Arteries: Sufficient height above annulus for deployment   Optimum Fluoroscopic Angle for Delivery: LAO 3 Caudal 6 degrees   IMPRESSION: 1. Tri-leaflet AV with calcium score 1645 and annular area of 505 mm2 suitable for a 26 mm Sapien 3 valve   2.  Coronary arteries sufficient height above annulus for deployment   3.  Optimum angiographic angle for deployment LAO 3 Caudal 6 degrees   4. Some nodular calcification in annulus at base of left coronary cusp   5.  Normal aortic root 3.3 cm  Left/Right Heart Catheterizations: Date: 08/18/20 Results: Widely patent coronary arteries High cardiac output: 9.06 L/min by Fick and 9.23 L/min by thermal dilution.  Etiology uncertain.  Suppressed TSH with normal T4 raising question of T3 thyrotoxicosis versus other mechanism. Severe pulmonary hypertension, mean pressure 48 mmHg, based upon hemodynamics WHO group 2 Pulmonary capillary wedge pressure mean 34 mmHg, pulmonary vascular resistance 1.55 Woods units. Mean right atrial pressure 19 mmHg Aortic valve gradient peak to peak 33 mmHg with mean gradient 22 mmHg. Aortic valve area 1.94 cm by Fick cardiac output and 1.98 cm by thermodilution LV function normal by echocardiography with moderate LVH   Recent Labs: 09/30/2021: ALT 21 10/03/2021: Magnesium 2.3 10/04/2021: B Natriuretic Peptide 184.7; Hemoglobin 8.7; Platelets 234 10/13/2021: BUN 26; Creatinine, Ser 1.57; Potassium 3.9; Sodium 139  Recent Lipid Panel    Component Value Date/Time   CHOL 127 08/17/2020 0351   TRIG 69  08/17/2020 0351   HDL 39 (L) 08/17/2020 0351   CHOLHDL 3.3 08/17/2020 0351   VLDL 14 08/17/2020 0351   LDLCALC 74 08/17/2020 0351     Risk Assessment/Calculations:     CHA2DS2-VASc Score = 4  This indicates a 4.8% annual risk of stroke. The patient's score is based upon: CHF History: 0 HTN History: 1 Diabetes History: 1 Stroke History: 0 Vascular Disease History: 1 Age Score: 1 Gender Score: 0      Physical Exam:    VS:  BP 140/64    Pulse (!) 51    Ht _0  (1.803 m)    Wt 135.6 kg    SpO2 97%    BMI 41.70 kg/m     Wt Readings from Last 3 Encounters:  11/17/21 135.6 kg  11/05/21 133.8 kg  10/13/21 128.9 kg     Gen: No distress  Neck: No JVD,  Ears: No Pilar Plate Sign Cardiac: No Rubs or Gallops, Subtle systolic Murmur, regular bradycardia, +2 radial pulses Respiratory: Clear to auscultation bilaterally, normal effort, normal  respiratory rate GI: Soft, nontender, distended MS: +2 bilateral pitting edema;  moves all extremities Integument: Skin feels warm Neuro:  At time of evaluation, alert and oriented to person/place/time/situation  Psych: Normal affect, patient feels OK   ASSESSMENT:    1. Chronic diastolic CHF (congestive heart failure), NYHA class 2 ([place])   2. Medication management     PLAN:     HFpEF HTN CKD Stage IIIa - continue home medications:  we have discussed that patient feels like he is getting mixed messages: he is being told he needs to drink more water for his kidney and that he has gained fluid weight - I have offered increase diuretic; he would like to try going back to a fluid restriction and seeing how things go, if labs are worse in two weeks will then  try higher dose lasix - patient will call in about who his nephrologist is so we can collaborate with them to optimize care - not on ACEI becase of AKI - not on SGLTi because of UTI - on lasix 40 mg PO daily  - hydralazine 25 mg PO  Persistent Atrial Flutter OSA on CPAP Morbid  Obesity - CHADSVASC=2. - TSH low normal 12.29.21, imaging notable for left atrial dilation and maximal left atrial appendage diameter 26 mm:  - Continue anticoagulation with eliquis.  - Continue rate control with metoprolol 50  - TSH, CMP, PFTs for amiodarone, yearly eye testing - Rhythm control options:  Continue amiodarone 200 mg PO daily  - will DC plavix   Severe Aortic Stenosis s/p TAVR trivial PVL - minimal residual gradient without evidence of significant paravalvular leak - antithrombotic plan: Eliquis - IE prophylasxis: augmentin 2 g PO PRN dental procedure  Will have pt follow up with HF team in 6-8 weeks Me in 5 month    Medication Adjustments/Labs and Tests Ordered: Current medicines are reviewed at length with the patient today.  Concerns regarding medicines are outlined above.  Orders Placed This Encounter  Procedures   Basic metabolic panel   Pro b natriuretic peptide (BNP)   Pulmonary Function Test   No orders of the defined types were placed in this encounter.   Patient Instructions  Medication Instructions:  Your physician recommends that you continue on your current medications as directed. Please refer to the Current Medication list given to you today.  *If you need a refill on your cardiac medications before your next appointment, please call your pharmacy*   Lab Work: IN 2 -3 WEEKS: BMP, BNP  If you have labs (blood work) drawn today and your tests are completely normal, you will receive your results only by: [place] (if you have MyChart) OR A paper copy in the mail If you have any lab test that is abnormal or we need to change your treatment, we will call you to review the results.   Testing/Procedures: Your physician has requested that you maintain a 2L (2,000 ml) fluid restriction  Your physician has requested that you see the [facility] in 6-8 weeks.  Your physician has recommended that you have a pulmonary  function test. Pulmonary Function Tests are a group of tests that measure how well air moves in and out of your lungs. Patient Instructions for Pulmonary Function Test  Do not smoke within at least 1 hour before the test. Do not consume Caffeine 4 hours prior to the test. Do not consume Alcohol within 4 hours prior to testing. Do not perform any Vigorous Exercise within 30 minutes before the test. Do not wear clothes that restrict the chest area or abdomen. Do not use albuterol or Xopenex 3 hours before the test or any other nebulizer medications or inhalers.     Follow-Up: At [facility], you and your health needs are our priority.  As part of our continuing mission to provide you with exceptional heart care, we have created designated Provider Care Teams.  These Care Teams include your primary Cardiologist (physician) and Advanced Practice Providers (APPs -  Physician Assistants and Nurse Practitioners) who all work together to provide you with the care you need, when you need it.  We recommend signing up for the patient portal called "MyChart".  Sign up information is provided on this After Visit Summary.  MyChart is used to connect with patients for Virtual  Visits (Telemedicine).  Patients are able to view lab/test results, encounter notes, upcoming appointments, etc.  Non-urgent messages can be sent to your provider as well.   To learn more about what you can do with MyChart, go to [email or web address].    Your next appointment:   5 month(s)  The format for your next appointment:   In Person  Provider:   Werner Lean, MD         Signed, Werner Lean, MD  11/17/2021 10:23 AM    [place]

## 2021-11-17 ENCOUNTER — Other Ambulatory Visit: Payer: Self-pay

## 2021-11-17 ENCOUNTER — Ambulatory Visit: Payer: Medicare HMO | Admitting: Internal Medicine

## 2021-11-17 ENCOUNTER — Telehealth: Payer: Self-pay | Admitting: Internal Medicine

## 2021-11-17 ENCOUNTER — Telehealth: Payer: Self-pay

## 2021-11-17 ENCOUNTER — Encounter: Payer: Self-pay | Admitting: Internal Medicine

## 2021-11-17 VITALS — BP 140/64 | HR 51 | Ht 71.0 in | Wt 299.0 lb

## 2021-11-17 DIAGNOSIS — Z79899 Other long term (current) drug therapy: Secondary | ICD-10-CM

## 2021-11-17 DIAGNOSIS — I5032 Chronic diastolic (congestive) heart failure: Secondary | ICD-10-CM

## 2021-11-17 NOTE — Telephone Encounter (Signed)
**Note De-Identified Lawrence Rose Obfuscation** I called [employer] and was advised by Shanda Bumps that they have not received Eliquis any applications for this pt. in the past or present.  I called the pt to discuss but got no answer so I left a message on his VM asking him to call Larita Fife at Dr Debby Bud office at [facility] at [phone].

## 2021-11-17 NOTE — Telephone Encounter (Signed)
Patient called to give the name his kidney Dr.  He sees Dr. Lequita Halt with [facility].

## 2021-11-17 NOTE — Telephone Encounter (Deleted)
**Note De-identified Chelbi Herber Obfuscation** -----  **Note De-Identified Fiana Gladu Obfuscation** Message from Macie Burows, RN sent at 11/17/2021 10:21 AM EST ----- Regarding: Pt assistance for eliquis Hi, I have this pt in the office requesting eliquis assistance.  He said he applied but has not heard anything else.  Could you follow up.   Alena Bills, RN

## 2021-11-17 NOTE — Telephone Encounter (Signed)
**Note De-identified Lawrence Rose Obfuscation** -----  **Note De-Identified Kerem Gilmer Obfuscation** Message from Macie Burows, RN sent at 11/17/2021 10:21 AM EST ----- Regarding: Pt assistance for eliquis Hi, I have this pt in the office requesting eliquis assistance.  He said he applied but has not heard anything else.  Could you follow up.

## 2021-11-17 NOTE — Patient Instructions (Signed)
Medication Instructions:  Your physician recommends that you continue on your current medications as directed. Please refer to the Current Medication list given to you today.  *If you need a refill on your cardiac medications before your next appointment, please call your pharmacy*   Lab Work: IN 2 -3 WEEKS: BMP, BNP  If you have labs (blood work) drawn today and your tests are completely normal, you will receive your results only by: MyChart Message (if you have MyChart) OR A paper copy in the mail If you have any lab test that is abnormal or we need to change your treatment, we will call you to review the results.   Testing/Procedures: Your physician has requested that you maintain a 2L (2,000 ml) fluid restriction  Your physician has requested that you see the [facility] in 6-8 weeks.  Your physician has recommended that you have a pulmonary function test. Pulmonary Function Tests are a group of tests that measure how well air moves in and out of your lungs. Patient Instructions for Pulmonary Function Test  Do not smoke within at least 1 hour before the test. Do not consume Caffeine 4 hours prior to the test. Do not consume Alcohol within 4 hours prior to testing. Do not perform any Vigorous Exercise within 30 minutes before the test. Do not wear clothes that restrict the chest area or abdomen. Do not use albuterol or Xopenex 3 hours before the test or any other nebulizer medications or inhalers.     Follow-Up: At [facility], you and your health needs are our priority.  As part of our continuing mission to provide you with exceptional heart care, we have created designated Provider Care Teams.  These Care Teams include your primary Cardiologist (physician) and Advanced Practice Providers (APPs -  Physician Assistants and Nurse Practitioners) who all work together to provide you with the care you need, when you need it.  We recommend signing up for the  patient portal called "MyChart".  Sign up information is provided on this After Visit Summary.  MyChart is used to connect with patients for Virtual Visits (Telemedicine).  Patients are able to view lab/test results, encounter notes, upcoming appointments, etc.  Non-urgent messages can be sent to your provider as well.   To learn more about what you can do with MyChart, go to [email or web address].    Your next appointment:   5 month(s)  The format for your next appointment:   In Person  Provider:   Christell Constant, MD

## 2021-11-17 NOTE — Telephone Encounter (Signed)
**Note De-Identified Fiorela Pelzer Obfuscation** The pt states that he gave his [employer] application to someone at Joni Reining, NP's office at [facility] and he states that he has s/w someone about it since then and was advised that they would check on his application.  I advised the pt that he can complete another [employer]MS[employer] application for Eliquis, obtain required documents per [employer], and to bring all to Dr Debby Bud office to drop off and that we will take care of the providers page of his application and will fax all to [employer].  He stated that he wants to contact our [facility] office for an update on the application he left at their office.  He thanked me for calling him with this update.

## 2021-11-29 NOTE — Telephone Encounter (Signed)
Last OV note faxed via Epic to Dr. Lequita Halt Nephrologist. Fax number [phone].

## 2021-12-14 ENCOUNTER — Other Ambulatory Visit: Payer: Self-pay | Admitting: Physician Assistant

## 2022-01-13 IMAGING — CT CT ANGIO CHEST
2 of 7 series · 14 of 46 positions shown · IV contrast (APPLIED)
Comparison: None.

CLINICAL DATA: 64-year-old male with history of severe aortic
stenosis. Preprocedural study prior to potential transcatheter
aortic valve replacement (TAVR) procedure.

EXAM:
CT ANGIOGRAPHY CHEST, ABDOMEN AND PELVIS
TECHNIQUE: Non-contrast CT of the chest was initially obtained.

[Series 1: ax thins · axial · 0.59mm/px · z∈[-638,+17]mm · 11 of 733 slices shown]
[im 39/733  lung]
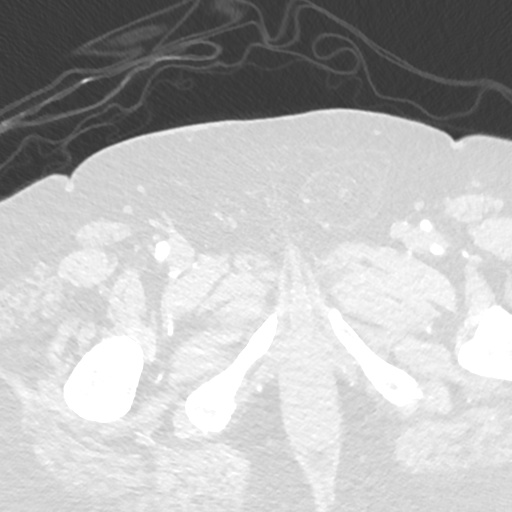
[im 116/733  soft-tissue]
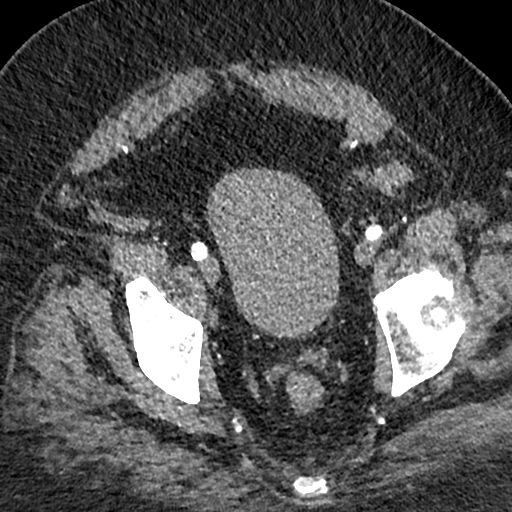
[im 193/733  lung]
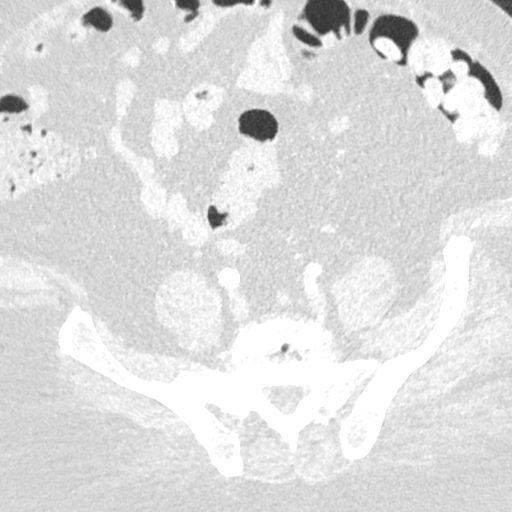
[im 232/733  soft-tissue]
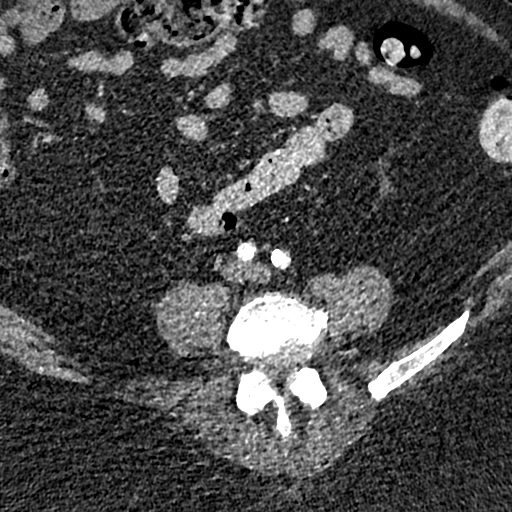
[im 309/733  lung]
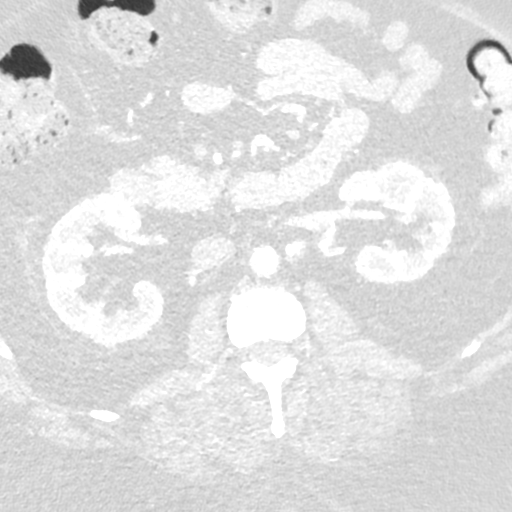
[im 386/733  soft-tissue]
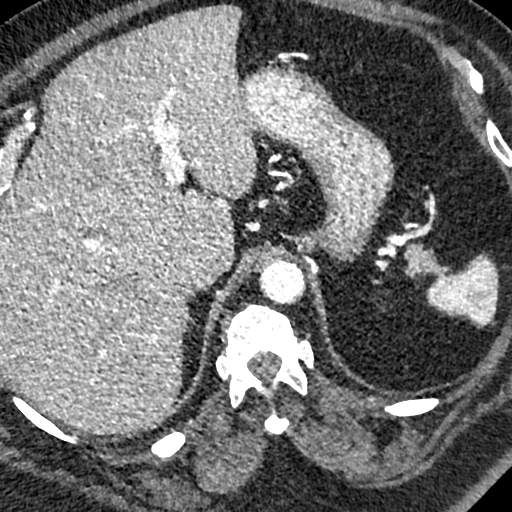
[im 424/733  lung]
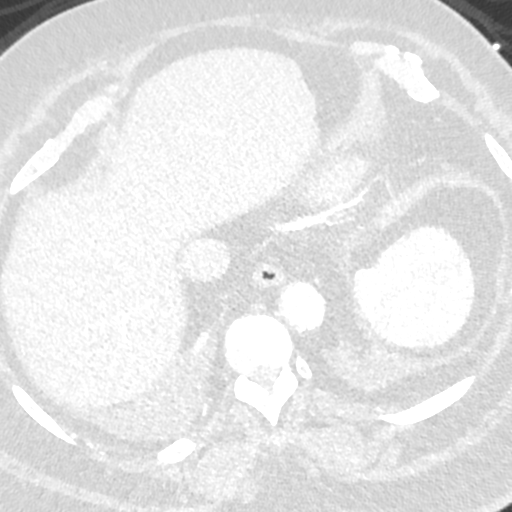
[im 501/733  soft-tissue]
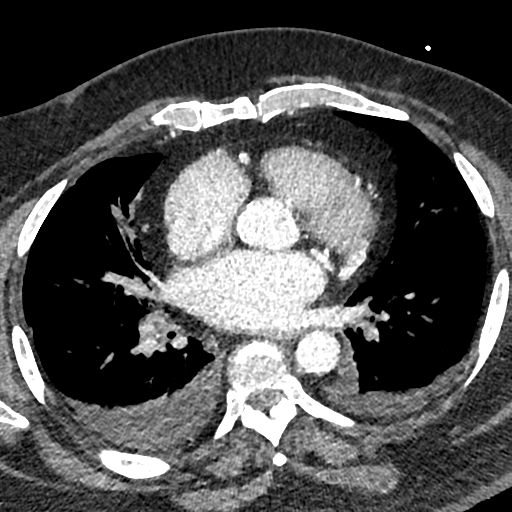
[im 540/733  lung]
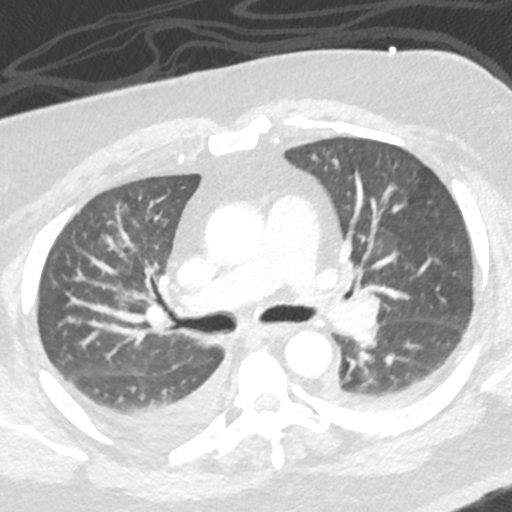
[im 617/733  soft-tissue]
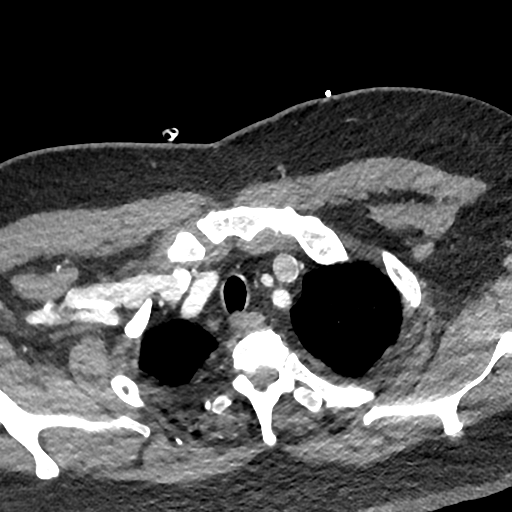
[im 694/733  lung]
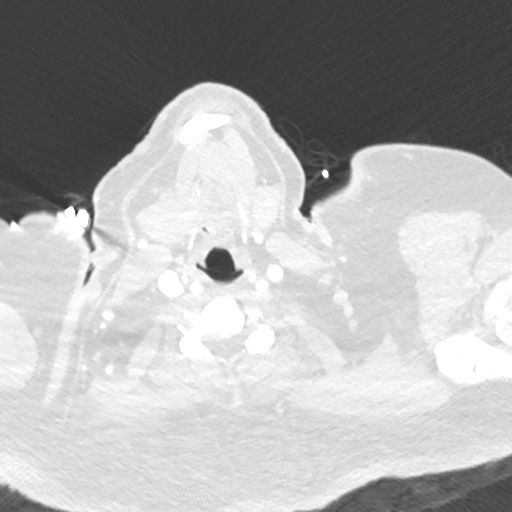

[Series 6: cor · coronal · 0.87mm/px · 3 of 199 slices shown]
[im 50/199  soft-tissue]
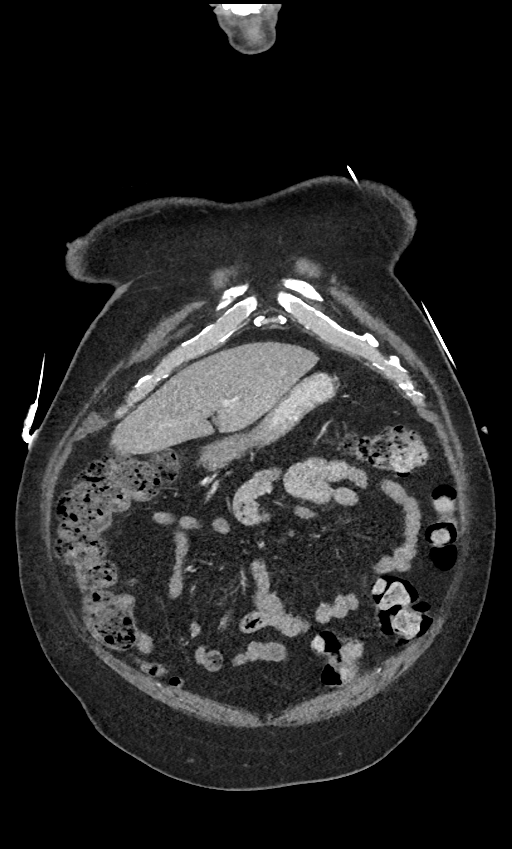
[im 100/199  soft-tissue]
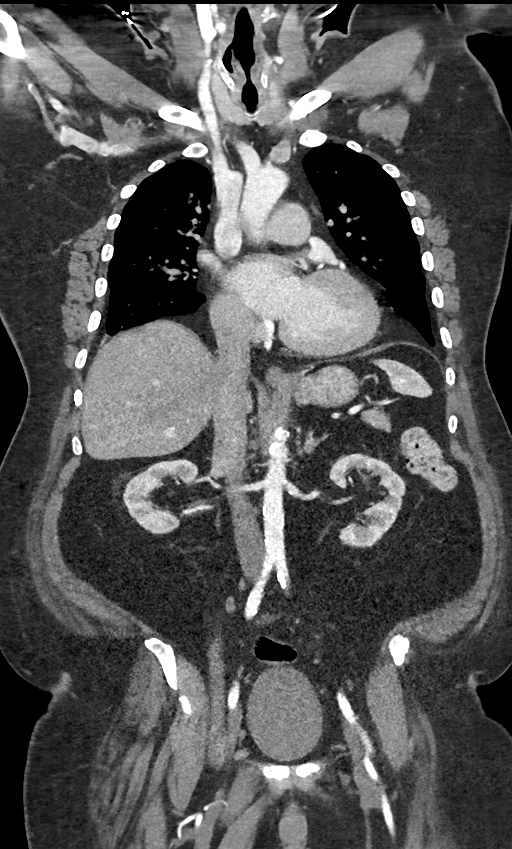
[im 149/199  soft-tissue]
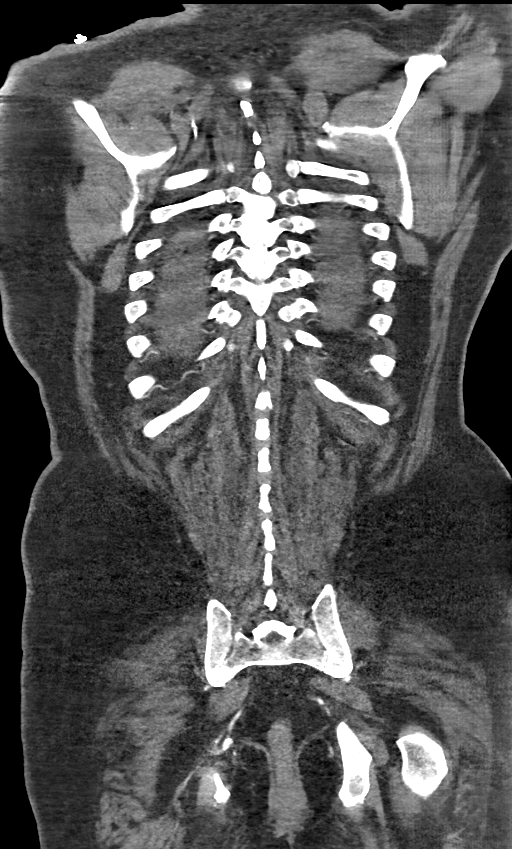

[14 of 46 positions shown; findings below may reference images not displayed]

Multidetector CT imaging through the chest, abdomen and pelvis was
performed using the standard protocol during bolus administration of
intravenous contrast. Multiplanar reconstructed images and MIPs were
obtained and reviewed to evaluate the vascular anatomy.

CONTRAST:  100mL OMNIPAQUE IOHEXOL 350 MG/ML SOLN
FINDINGS: CTA CHEST FINDINGS

Cardiovascular: Heart size is mildly enlarged. There is no
significant pericardial fluid, thickening or pericardial
calcification. There is aortic atherosclerosis, as well as
atherosclerosis of the great vessels of the mediastinum and the
coronary arteries, including calcified atherosclerotic plaque in the
left anterior descending, left circumflex and right coronary
arteries. Severe thickening calcification of the aortic valve. Mild
calcifications of the mitral annulus.

Mediastinum/Nodes: No pathologically enlarged mediastinal or hilar
lymph nodes. Esophagus is unremarkable in appearance. No axillary
lymphadenopathy.

Lungs/Pleura: Patchy areas of ground-glass attenuation and mild
interlobular septal thickening noted throughout the lungs
bilaterally, favored to reflect a background of mild interstitial
pulmonary edema. No confluent consolidative airspace disease.
Moderate right and small left pleural effusions lying dependently
with some associated passive subsegmental atelectasis in the lower
lobes of the lungs bilaterally. Mild scarring in the medial aspect
of the right upper lobe and inferior segment of the lingula.

Musculoskeletal: There are no aggressive appearing lytic or blastic
lesions noted in the visualized portions of the skeleton.

CTA ABDOMEN AND PELVIS FINDINGS

Hepatobiliary: Diffuse low attenuation throughout the hepatic
parenchyma, indicative of a background of hepatic steatosis. In the
inferior aspect of segment 5 of the liver (axial image 134 of series
4) there is a small hypovascular lesion measuring 1.3 cm in diameter
(44 HU), which is indeterminate. No other larger more suspicious
appearing hepatic lesions. No intra or extrahepatic biliary ductal
dilatation. Gallbladder is normal in appearance.

Pancreas: No pancreatic mass. No pancreatic ductal dilatation. No
pancreatic or peripancreatic fluid collections or inflammatory
changes.

Spleen: Unremarkable.

Adrenals/Urinary Tract: Subcentimeter low-attenuation lesions in the
right kidney, too small to characterize, but statistically likely to
represent cysts. Left kidney and bilateral adrenal glands are normal
in appearance. No hydroureteronephrosis. Urinary bladder is normal
in appearance.

Stomach/Bowel: The appearance of the stomach is normal. No
pathologic dilatation of small bowel or colon. Normal appendix.

Vascular/Lymphatic: Aortic atherosclerosis, with vascular findings
and measurements pertinent to potential TAVR procedure, as detailed
below. No aneurysm or dissection noted in the abdominal or pelvic
vasculature. No lymphadenopathy noted in the abdomen or pelvis.

Reproductive: Prostate gland and seminal vesicles are unremarkable
in appearance.

Other: Moderate-sized umbilical hernia containing only omental fat.
Left inguinal hernia containing predominantly fat. No significant
volume of ascites. No pneumoperitoneum.

Musculoskeletal: There are no aggressive appearing lytic or blastic
lesions noted in the visualized portions of the skeleton.

VASCULAR MEASUREMENTS PERTINENT TO TAVR:

AORTA:

Minimal Aortic 6iameter-6Y x 12 mm

Severity of Aortic Calcification-moderate to severe

RIGHT PELVIS:

Right Common Iliac Artery -

Minimal 4iameter-2.I x 7.8 mm

Tortuosity - mild

Calcification-moderate

Right External Iliac Artery -

Minimal Riameter-N.X x 4.2 mm

Tortuosity - mild

Calcification-mild

Right Common Femoral Artery -

Minimal Kiameter-X.F x 7.6 mm

Tortuosity - mild

Calcification-mild

LEFT PELVIS:

Left Common Iliac Artery -

Minimal Diameter-B.9 x 7.8 mm

Tortuosity - mild

Calcification-moderate

Left External Iliac Artery -

Minimal 8iameter-F.F x 5.8 mm

Tortuosity - mild

Calcification-none

Left Common Femoral Artery -

Minimal Uiameter-T.S x 8.2 mm

Tortuosity-mild

Calcification-minimal

Review of the MIP images confirms the above findings.
IMPRESSION: 1. Vascular findings and measurements pertinent to potential TAVR
procedure, as detailed above.
2. Severe thickening calcification of the aortic valve, compatible
with the reported clinical history of severe aortic stenosis.
3. Aortic atherosclerosis, in addition to three vessel coronary
artery disease. Please note that although the presence of coronary
artery calcium documents the presence of coronary artery disease,
the severity of this disease and any potential stenosis cannot be
assessed on this non-gated CT examination. Assessment for potential
risk factor modification, dietary therapy or pharmacologic therapy
may be warranted, if clinically indicated.
4. Cardiomegaly with evidence of interstitial pulmonary edema and
bilateral pleural effusions (right greater than left); imaging
findings suggestive of congestive heart failure.
5. Hepatic steatosis.
6. 1.3 cm indeterminate hypovascular lesion in segment 5 of the
liver. Correlation with nonemergent abdominal MRI with and without
IV gadolinium is recommended in the near future to definitively
characterize this lesion and exclude neoplasm.
7. Additional incidental findings, as above.

## 2022-02-08 ENCOUNTER — Other Ambulatory Visit: Payer: Self-pay | Admitting: Internal Medicine

## 2022-02-14 ENCOUNTER — Other Ambulatory Visit: Payer: Self-pay | Admitting: Internal Medicine

## 2022-02-17 ENCOUNTER — Other Ambulatory Visit: Payer: Self-pay | Admitting: Internal Medicine

## 2022-02-18 ENCOUNTER — Telehealth: Payer: Self-pay | Admitting: *Deleted

## 2022-02-18 ENCOUNTER — Telehealth: Payer: Self-pay | Admitting: Internal Medicine

## 2022-02-18 NOTE — Telephone Encounter (Signed)
Message in patient calls per Dalia Heading. Patient requesting 90 day w/refills for Clopidogrel (Plavix) medication is not listed on medication list. He is requesting that medication be sent into [facility] on [address] in [place], [place]. Please advise. Thank you ?

## 2022-02-18 NOTE — Telephone Encounter (Signed)
?*  STAT* If patient is at the pharmacy, call can be transferred to refill team. ? ? ?1. Which medications need to be refilled? (please list name of each medication and dose if known) Clopidogrel ? ?2. Which pharmacy/location (including street and city if local pharmacy) is medication to be sent to? [facility] [address], [place] ? ?3. Do they need a 30 day or 90 day supply? 90 days and refills ? ?

## 2022-02-18 NOTE — Telephone Encounter (Signed)
From last OV note: 11/17/21  ?Persistent Atrial Flutter ?OSA on CPAP ?Morbid Obesity ?- CHADSVASC=2. ?- TSH low normal 12.29.21, imaging notable for left atrial dilation and maximal left atrial appendage diameter 26 mm:  ?- Continue anticoagulation with eliquis.  ?- Continue rate control with metoprolol 50  ?- TSH, CMP, PFTs for amiodarone, yearly eye testing ?- Rhythm control options:  Continue amiodarone 200 mg PO daily  ?- will DC plavix ? ?Advised patient that the Plavix was discontinued and he did not in to take that medication anymore.  ?Verbalized understanding.  ?

## 2022-04-12 ENCOUNTER — Other Ambulatory Visit: Payer: Self-pay | Admitting: Physician Assistant

## 2022-05-15 NOTE — Progress Notes (Unsigned)
Cardiology Office Note:    Date:  05/16/2022   ID:  Lawrence Rose, DOB [birth year], MRN [id number]  Goes by Lawrence Rose in the community   PCP:  Lawrence Rose, Lawrence Grief, MD  [facility] Cardiologist:  Werner Lean, MD  [facility] Electrophysiologist:  Quentin Ore (seen in patient only)  CC: F/u TAVR and AFL  History of Present Illness:    Lawrence Rose is a 66 y.o. male with a hx of HTN, HLD, Morbid Obesity, OSA on CPAP, CKD Stage III, Atrial flutter on eliquis with recent GI bleed initially since inpatient with severe aortic stenosis s/p 09/01/20 TAVR.  2021: inpatient TAVR, Post TAVR was diagnosed with AFl and put on eliquis and amiodarone.  This leg to GI bleed with 10/02/20 hospitalization.   2022: s/p DCCV and HF decompensation admission. 2023: AF burden improved.  Patient notes that he is doing ok.   Since last visit notes that he has been able to go shopping and walk with a cane a little bit. Has hip pain that keeps him  There are no interval hospital/ED visit.    No chest pain or pressure.  Feels some nasal congestion but no DOE and no PND/Orthopnea.  No weight gain or leg swelling.  No palpitations or syncope.  Ambulatory blood pressure Not Done.   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 Angelena Form and Dr. Cyndia Bent.    Severe aortic stenosis    Sleep apnea    wears CPAP every night    Past Surgical History:  Procedure Laterality Date   BACK SURGERY     CARDIOVERSION  01/18/2021   CARDIOVERSION N/A 01/18/2021    Procedure: CARDIOVERSION;  Surgeon: Werner Lean, MD;  Location: [facility];  Service: Cardiovascular;  Laterality: N/A;   ESOPHAGOGASTRODUODENOSCOPY (EGD) WITH PROPOFOL N/A 10/01/2020   Procedure: ESOPHAGOGASTRODUODENOSCOPY (EGD) WITH PROPOFOL;  Surgeon: [name], MD;  Location: [place] [facility];  Service: Endoscopy;  Laterality: N/A;   RIGHT/LEFT HEART CATH AND CORONARY ANGIOGRAPHY N/A 08/18/2020   Procedure: RIGHT/LEFT HEART CATH AND CORONARY ANGIOGRAPHY;  Surgeon: [name], MD;  Location: [place] [facility];  Service: Cardiovascular;  Laterality: N/A;   TEE WITHOUT CARDIOVERSION N/A 08/21/2020   Procedure: TRANSESOPHAGEAL ECHOCARDIOGRAM (TEE);  Surgeon: [name], MD;  Location: [facility];  Service: Cardiovascular;  Laterality: N/A;   TEE WITHOUT CARDIOVERSION N/A 09/01/2020   Procedure: TRANSESOPHAGEAL ECHOCARDIOGRAM (TEE);  Surgeon: [name], MD;  Location: [place] [facility];  Service: Open Heart Surgery;  Laterality: N/A;   TRANSCATHETER AORTIC VALVE REPLACEMENT, TRANSFEMORAL N/A 09/01/2020   Procedure: TRANSCATHETER AORTIC VALVE REPLACEMENT, TRANSFEMORAL;  Surgeon: [name], MD;  Location: [place] [facility];  Service: Open Heart Surgery;  Laterality: N/A;    Current Medications: Current Meds  Medication Sig   albuterol (VENTOLIN HFA) 108 (90 Base) MCG/ACT inhaler Inhale 2 puffs into the lungs  every 4 (four) hours as needed for wheezing or shortness of breath.   amiodarone (PACERONE) 200 MG tablet Take 1 tablet by mouth once daily   apixaban (ELIQUIS) 5 MG TABS tablet Take 1 tablet (5 mg total) by mouth 2 (two) times daily.   Blood Glucose Monitoring Suppl (FIFTY50 GLUCOSE METER 2.0) w/Device KIT 1 each by Other route in the morning, at noon, and at bedtime.   Cholecalciferol 25 MCG (1000 UT) tablet Take 1,000 Units by mouth at bedtime.    fluticasone (FLONASE) 50 MCG/ACT nasal spray Place 1 spray into both nostrils as  needed for congestion.   gabapentin (NEURONTIN) 300 MG capsule Take 600 mg by mouth 2 (two) times daily.    HYDROcodone-acetaminophen (NORCO) 10-325 MG tablet Take 1 tablet by mouth every 6 (six) hours as needed for moderate pain.    Iron-Vitamins (GERITOL PO) Take 1 tablet by mouth daily.   latanoprost (XALATAN) 0.005 % ophthalmic solution Place 1 drop into both eyes at bedtime.    metoprolol tartrate (LOPRESSOR) 50 MG tablet Take 1 tablet (50 mg total) by mouth 2 (two) times daily.   Multiple Vitamin (MULTIVITAMIN WITH MINERALS) TABS tablet Take 1 tablet by mouth at bedtime.    neomycin-bacitracin-polymyxin (NEOSPORIN) [phone] ointment Apply 1 application topically 2 (two) times daily as needed (itching).   oxyCODONE ER (XTAMPZA ER) 9 MG C12A Take 9 mg by mouth every 12 (twelve) hours.   pantoprazole (PROTONIX) 40 MG tablet Take 40 mg by mouth 2 (two) times daily.   polyethylene glycol (MIRALAX / GLYCOLAX) 17 g packet Take 17 g by mouth daily.   PRESCRIPTION MEDICATION Inhale into the lungs at bedtime. CPAP   rosuvastatin (CRESTOR) 20 MG tablet Take 20 mg by mouth daily.    senna-docusate (SENOKOT-S) 8.6-50 MG tablet Take 1 tablet by mouth daily.   torsemide (DEMADEX) 20 MG tablet Take 40 mg by mouth 2 (two) times daily.   [DISCONTINUED] hydrALAZINE (APRESOLINE) 50 MG tablet Take 50 mg by mouth 3 (three) times daily.     Allergies:   Cocoa, Sulfamethoxazole-trimethoprim, Chocolate, Citrus, Fish allergy, Shellfish allergy, Sulfa antibiotics, and Tetracycline   Social History   Socioeconomic History   Marital status: Single    Spouse name: Not on file   Number of children: 0   Years of education: Not on file   Highest education level: Not on file  Occupational History   Occupation: retired  Tobacco Use   Smoking status: Former    Packs/day: 1.00    Years: 15.00    Total pack years: 15.00    Types: Cigarettes    Quit date: 10/17/1988    Years since quitting: 33.6   Smokeless  tobacco: Never   Tobacco comments:    per patient quit over 30 years ago  Vaping Use   Vaping Use: Never used  Substance and Sexual Activity   Alcohol use: Not Currently   Drug use: Not Currently   Sexual activity: Not on file  Other Topics Concern   Not on file  Social History Narrative   Not on file   Social Determinants of Health   Financial Resource Strain: Medium Risk (09/27/2021)   Overall Financial Resource Strain (CARDIA)    Difficulty of Paying Living Expenses: Somewhat hard  Food Insecurity: No Food Insecurity (09/27/2021)   Hunger Vital Sign    Worried About Running Out of Food in the Last Year: Never true    [place] in the Last Year:  Never true  Transportation Needs: No Transportation Needs (09/27/2021)   PRAPARE - Hydrologist (Medical): No    Lack of Transportation (Non-Medical): No  Physical Activity: Not on file  Stress: Not on file  Social Connections: Not on file    Social:  Dollene Cleveland always comes with his friend, Shamea's Cousin  Family History: History of coronary artery disease notable for no members. History of heart failure notable for no members. History of arrhythmia notable for no members.  ROS:   Please see the history of present illness.    All other systems reviewed and are negative.  EKGs/Labs/Other Studies Reviewed:    The following studies were reviewed today:  EKG:   05/16/22: Sinus bradycardia rate 54  12/24/2020: AFl 114  10/29/20: Atrial Flutter rate 102 10/14/20  Atrial Flutter heart rate 98  Transthoracic Echocardiogram:  Echo 09/27/21 IMPRESSIONS  1. Left ventricular ejection fraction, by estimation, is 60 to 65%. The  left ventricle has normal function. The left ventricle has no regional  wall motion abnormalities. There is mild left ventricular hypertrophy.  Left ventricular diastolic parameters  were normal.   2. Right ventricular systolic function is normal. The right ventricular   size is normal.   3. Left atrial size was moderately dilated.   4. Right atrial size was moderately dilated.   5. The pericardial effusion is lateral to the left ventricle.   6. The mitral valve is degenerative. No evidence of mitral valve  regurgitation. No evidence of mitral stenosis. Moderate mitral annular  calcification.   7. Post 29 mm Medtronic supra annular Core valve stable gradients since  01/19/21 trivial AR likely PVL . The aortic valve has been  repaired/replaced. Aortic valve regurgitation is trivial. No aortic  stenosis is present.   8. The inferior vena cava is normal in size with greater than 50%  respiratory variability, suggesting right atrial pressure of 3 mmHg.    Transesophageal Echocardiogram: Date:08/21/2020 Results: SIZING IMPRESSIONS   1. Severe AS (mean gradient 42 mmHg and peak velocity of 4.2 m/s); mild  AI.   2. Left ventricular ejection fraction, by estimation, is 70 to 75%. The  left ventricle has hyperdynamic function. There is severe left ventricular  hypertrophy.   3. Right ventricular systolic function is normal. The right ventricular  size is normal.   4. Left atrial size was moderately dilated. No left atrial/left atrial  appendage thrombus was detected.   5. The mitral valve is normal in structure. Trivial mitral valve  regurgitation.   6. The aortic valve is tricuspid. Aortic valve regurgitation is mild.  Severe aortic valve stenosis.   7. There is Moderate (Grade III) plaque involving the descending aorta.  Sizing Measurements for Watchman Flex: 45 degree: 17 mm, Depth of 27 mm 90 18 mm Depth of 21 mm 135 26 mm, Depth of 30 mm 0 degree not done  Cardiac CT: Date: 08/19/20 Results: FINDINGS: Aortic Valve: Calcium score 1645 Tri leaflet with restricted motion   Aorta: No aneurysm moderate calcific atherosclerosis normal arch vessels   Sino-tubular Junction: 25 mm   Ascending Thoracic Aorta: 33 mm   Aortic Arch: 25 mm    Descending Thoracic Aorta: 25 mm   Sinus of Valsalva Measurements:   Non-coronary: 30.8 mm   Right - coronary: 28.2 mm   Left - coronary: 31.3 mm   Coronary Artery Height above Annulus:   Left Main: 11.1 mm above annulus   Right Coronary: 15.3  mm above annulus   Virtual Basal Annulus Measurements:   Maximum/Minimum Diameter: 27.4 mm x 22.8 mm   Perimeter: 82 mm   Area: 505 mm2   Coronary Arteries: Sufficient height above annulus for deployment   Optimum Fluoroscopic Angle for Delivery: LAO 3 Caudal 6 degrees   IMPRESSION: 1. Tri-leaflet AV with calcium score 1645 and annular area of 505 mm2 suitable for a 26 mm Sapien 3 valve   2.  Coronary arteries sufficient height above annulus for deployment   3.  Optimum angiographic angle for deployment LAO 3 Caudal 6 degrees   4. Some nodular calcification in annulus at base of left coronary cusp   5.  Normal aortic root 3.3 cm  Left/Right Heart Catheterizations: Date: 08/18/20 Results: Widely patent coronary arteries High cardiac output: 9.06 L/min by Fick and 9.23 L/min by thermal dilution.  Etiology uncertain.  Suppressed TSH with normal T4 raising question of T3 thyrotoxicosis versus other mechanism. Severe pulmonary hypertension, mean pressure 48 mmHg, based upon hemodynamics WHO group 2 Pulmonary capillary wedge pressure mean 34 mmHg, pulmonary vascular resistance 1.55 Woods units. Mean right atrial pressure 19 mmHg Aortic valve gradient peak to peak 33 mmHg with mean gradient 22 mmHg. Aortic valve area 1.94 cm by Fick cardiac output and 1.98 cm by thermodilution LV function normal by echocardiography with moderate LVH   Recent Labs: 09/30/2021: ALT 21 10/03/2021: Magnesium 2.3 10/04/2021: B Natriuretic Peptide 184.7; Hemoglobin 8.7; Platelets 234 10/13/2021: BUN 26; Creatinine, Ser 1.57; Potassium 3.9; Sodium 139  Recent Lipid Panel    Component Value Date/Time   CHOL 127 08/17/2020 0351   TRIG 69  08/17/2020 0351   HDL 39 (L) 08/17/2020 0351   CHOLHDL 3.3 08/17/2020 0351   VLDL 14 08/17/2020 0351   LDLCALC 74 08/17/2020 0351    Physical Exam:    VS:  Pulse (!) 54   Ht '5\' 10"'  (1.778 m)   Wt 286 lb (129.7 kg)   SpO2 95%   BMI 41.04 kg/m     Wt Readings from Last 3 Encounters:  05/16/22 286 lb (129.7 kg)  11/17/21 299 lb (135.6 kg)  11/05/21 295 lb (133.8 kg)    Repeat L arm BP: 90/50  Gen: No distress  Neck: No JVD Ears: No Pilar Plate Sign Cardiac: No Rubs or Gallops, systolic ejection murmur, regular bradycardia, +2 radial pulses Respiratory: Clear to auscultation bilaterally, normal effort, normal  respiratory rate GI: Soft, nontender, distended MS: no edema;  moves all extremities Integument: Skin feels warm Neuro:  At time of evaluation, alert and oriented to person/place/time/situation  Psych: Normal affect, patient feels OK  ASSESSMENT:    1. Severe aortic stenosis   2. Chronic heart failure with preserved ejection fraction ([place])   3. PAF (paroxysmal atrial fibrillation) ([place])   4. Atrial flutter, unspecified type ([place])   5. S/P TAVR (transcatheter aortic valve replacement)   6. Stage 3b chronic kidney disease ([facility])   7. Obesity, Class III, BMI 40-49.9 (morbid obesity) ([place])     PLAN:    Asymptomatic hypotension HFpEF HTN CKD Stage IIIa - we will have him stop his hydralazine: he will get a BP check 05/18/22 with his Neurology PA; if still low BP would stop metoprolol 50 mg after this, if high BP would only increase to 25 mg of hydralazine; if unable to check BP there will bring in for BP visit - Diuretic torsemide 40 mg PO DIB - Nephrologist is Dr. Olivia Mackie - not on ACEI becase of  AKI - not on SGLTi because of UTI's - on torsemide 40 mg PO BID  Persistent Atrial Flutter OSA on CPAP Morbid Obesity - CHADSVASC=2-3. - Continue anticoagulation with eliquis.  - Continue rate control with metoprolol 50  - TSH, CMP, PFTs for amiodarone, yearly eye testing  in 2024 - Rhythm control options:  Continue amiodarone 200 mg PO daily   Severe Aortic Stenosis s/p TAVR trivial PVL - minimal residual gradient without evidence of significant paravalvular leak - antithrombotic plan: Eliquis - IE prophylasxis: augmentin 2 g PO PRN dental procedure - next echo 09/2021  ADDENDUM: 05/19/22: had [facility] visit; his BP has rebounded, 120/60.  Will not discontinue more medication.  Medication Adjustments/Labs and Tests Ordered: Current medicines are reviewed at length with the patient today.  Concerns regarding medicines are outlined above.  Orders Placed This Encounter  Procedures   EKG 12-Lead   ECHOCARDIOGRAM COMPLETE   No orders of the defined types were placed in this encounter.   Patient Instructions  Medication Instructions:  Your physician has recommended you make the following change in your medication:  STOP: hydralazine : Please monitor your Blood Pressure  *If you need a refill on your cardiac medications before your next appointment, please call your pharmacy*   Lab Work: NONE If you have labs (blood work) drawn today and your tests are completely normal, you will receive your results only by: [place] (if you have MyChart) OR A paper copy in the mail If you have any lab test that is abnormal or we need to change your treatment, we will call you to review the results.   Testing/Procedures: Jan 2024- Your physician has requested that you have an echocardiogram. Echocardiography is a painless test that uses sound waves to create images of your heart. It provides your doctor with information about the size and shape of your heart and how well your heart's chambers and valves are working. This procedure takes approximately one hour. There are no restrictions for this procedure.    Follow-Up: At [facility], you and your health needs are our priority.  As part of our continuing mission to provide you with exceptional heart care,  we have created designated Provider Care Teams.  These Care Teams include your primary Cardiologist (physician) and Advanced Practice Providers (APPs -  Physician Assistants and Nurse Practitioners) who all work together to provide you with the care you need, when you need it.  We recommend signing up for the patient portal called "MyChart".  Sign up information is provided on this After Visit Summary.  MyChart is used to connect with patients for Virtual Visits (Telemedicine).  Patients are able to view lab/test results, encounter notes, upcoming appointments, etc.  Non-urgent messages can be sent to your provider as well.   To learn more about what you can do with MyChart, go to [email or web address].    Your next appointment:   5 month(s)  The format for your next appointment:   In Person  Provider:   Werner Lean, MD     Important Information About Sugar         Signed, Werner Lean, MD  05/16/2022 8:44 AM    [place]

## 2022-05-16 ENCOUNTER — Ambulatory Visit: Payer: Medicare HMO | Admitting: Internal Medicine

## 2022-05-16 ENCOUNTER — Encounter: Payer: Self-pay | Admitting: Internal Medicine

## 2022-05-16 VITALS — BP 90/50 | HR 54 | Ht 70.0 in | Wt 286.0 lb

## 2022-05-16 DIAGNOSIS — I5032 Chronic diastolic (congestive) heart failure: Secondary | ICD-10-CM

## 2022-05-16 DIAGNOSIS — I480 Paroxysmal atrial fibrillation: Secondary | ICD-10-CM

## 2022-05-16 DIAGNOSIS — I4892 Unspecified atrial flutter: Secondary | ICD-10-CM | POA: Diagnosis not present

## 2022-05-16 DIAGNOSIS — I350 Nonrheumatic aortic (valve) stenosis: Secondary | ICD-10-CM

## 2022-05-16 DIAGNOSIS — Z952 Presence of prosthetic heart valve: Secondary | ICD-10-CM

## 2022-05-16 DIAGNOSIS — N1832 Chronic kidney disease, stage 3b: Secondary | ICD-10-CM

## 2022-05-16 NOTE — Patient Instructions (Signed)
Medication Instructions:  Your physician has recommended you make the following change in your medication:  STOP: hydralazine : Please monitor your Blood Pressure  *If you need a refill on your cardiac medications before your next appointment, please call your pharmacy*   Lab Work: NONE If you have labs (blood work) drawn today and your tests are completely normal, you will receive your results only by: MyChart Message (if you have MyChart) OR A paper copy in the mail If you have any lab test that is abnormal or we need to change your treatment, we will call you to review the results.   Testing/Procedures: Jan 2024- Your physician has requested that you have an echocardiogram. Echocardiography is a painless test that uses sound waves to create images of your heart. It provides your doctor with information about the size and shape of your heart and how well your heart's chambers and valves are working. This procedure takes approximately one hour. There are no restrictions for this procedure.    Follow-Up: At [facility], you and your health needs are our priority.  As part of our continuing mission to provide you with exceptional heart care, we have created designated Provider Care Teams.  These Care Teams include your primary Cardiologist (physician) and Advanced Practice Providers (APPs -  Physician Assistants and Nurse Practitioners) who all work together to provide you with the care you need, when you need it.  We recommend signing up for the patient portal called "MyChart".  Sign up information is provided on this After Visit Summary.  MyChart is used to connect with patients for Virtual Visits (Telemedicine).  Patients are able to view lab/test results, encounter notes, upcoming appointments, etc.  Non-urgent messages can be sent to your provider as well.   To learn more about what you can do with MyChart, go to [email or web address].    Your next appointment:   5 month(s)  The  format for your next appointment:   In Person  Provider:   Christell Constant, MD     Important Information About Sugar

## 2022-05-24 ENCOUNTER — Other Ambulatory Visit: Payer: Self-pay | Admitting: Adult Health

## 2022-05-24 DIAGNOSIS — I4892 Unspecified atrial flutter: Secondary | ICD-10-CM

## 2022-05-24 NOTE — Telephone Encounter (Signed)
Prescription refill request for Eliquis received. Indication:Aflutter Last office visit:05/16/22 (Chandrasekar) Scr: 4.46 (04/13/22)  Age: 66 Weight: 129.7kg   Appropriate dose and refill sent to requested pharmacy.

## 2022-05-25 ENCOUNTER — Other Ambulatory Visit: Payer: Self-pay | Admitting: Internal Medicine

## 2022-10-24 ENCOUNTER — Encounter: Payer: Self-pay | Admitting: Internal Medicine

## 2022-10-24 ENCOUNTER — Ambulatory Visit: Payer: Medicare HMO | Attending: Internal Medicine | Admitting: Internal Medicine

## 2022-10-24 VITALS — BP 102/56 | HR 63 | Ht 70.0 in | Wt 305.8 lb

## 2022-10-24 DIAGNOSIS — D6869 Other thrombophilia: Secondary | ICD-10-CM | POA: Insufficient documentation

## 2022-10-24 DIAGNOSIS — I5032 Chronic diastolic (congestive) heart failure: Secondary | ICD-10-CM

## 2022-10-24 DIAGNOSIS — I350 Nonrheumatic aortic (valve) stenosis: Secondary | ICD-10-CM

## 2022-10-24 DIAGNOSIS — Z79899 Other long term (current) drug therapy: Secondary | ICD-10-CM | POA: Diagnosis not present

## 2022-10-24 DIAGNOSIS — I152 Hypertension secondary to endocrine disorders: Secondary | ICD-10-CM

## 2022-10-24 DIAGNOSIS — E1159 Type 2 diabetes mellitus with other circulatory complications: Secondary | ICD-10-CM | POA: Diagnosis not present

## 2022-10-24 DIAGNOSIS — G4733 Obstructive sleep apnea (adult) (pediatric): Secondary | ICD-10-CM

## 2022-10-24 DIAGNOSIS — I4892 Unspecified atrial flutter: Secondary | ICD-10-CM

## 2022-10-24 DIAGNOSIS — Z7901 Long term (current) use of anticoagulants: Secondary | ICD-10-CM

## 2022-10-24 NOTE — Progress Notes (Signed)
Cardiology Office Note:    Date:  10/24/2022   ID:  Lawrence Rose, DOB May 13, 1956, MRN [id number]  Goes by Lawrence Rose in the community   PCP:  Lawrence Rose, Lawrence RussellEdwin, MD  [facility] Cardiologist:  Lawrence ConstantMahesh A Aleksa Catterton, MD  [facility] Electrophysiologist:  Lawrence Rose (seen inpatient only)  CC: F/u TAVR and AFL  History of Present Illness:    Lawrence CallerJames Rose is a 67 y.o. male with a hx of HTN, HLD, Morbid Obesity, OSA on CPAP, CKD Stage III, Atrial flutter on eliquis with recent GI bleed initially since inpatient with severe aortic stenosis s/p 09/01/20 TAVR.  2021: inpatient TAVR, Post TAVR was diagnosed with AFl and put on eliquis and amiodarone.  This leg to GI bleed with 10/02/20 hospitalization.   2022: s/p DCCV and HF decompensation admission. 2023: AF burden improved. Had low BP at last visit that improved.  Patient notes that he is doing ok.   [facility]- BG was 698 in ED per patient.  A1C was 15.  His Insulin was changed and his diuretic was decreased to torsemide 40 mg PO daily.  No chest pain or pressure .  No SOB/DOE and no PND/Orthopnea.  No weight gain or leg swelling (from chart review his weight is up).  No palpitations or syncope.  He has numbness in his left arm; I am concerned for diabetic neuropathy.   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 Clifton Lawrence Rose and Dr. Laneta SimmersBartle.    Severe aortic stenosis    Sleep apnea    wears CPAP every night    Past Surgical History:  Procedure Laterality Date    BACK SURGERY     CARDIOVERSION  01/18/2021   CARDIOVERSION N/A 01/18/2021   Procedure: CARDIOVERSION;  Surgeon: Lawrence Constanthandrasekhar, Trixy Loyola A, MD;  Location: [facility] ENDOSCOPY;  Service: Cardiovascular;  Laterality: N/A;   ESOPHAGOGASTRODUODENOSCOPY (EGD) WITH PROPOFOL N/A 10/01/2020   Procedure: ESOPHAGOGASTRODUODENOSCOPY (EGD) WITH PROPOFOL;  Surgeon: Napoleon FormNandigam, Lawrence V, MD;  Location: [facility] ENDOSCOPY;  Service: Endoscopy;  Laterality: N/A;   RIGHT/LEFT HEART CATH AND CORONARY ANGIOGRAPHY N/A 08/18/2020   Procedure: RIGHT/LEFT HEART CATH AND CORONARY ANGIOGRAPHY;  Surgeon: Lyn RecordsSmith, Lawrence W, MD;  Location: [facility] INVASIVE CV LAB;  Service: Cardiovascular;  Laterality: N/A;   TEE WITHOUT CARDIOVERSION N/A 08/21/2020   Procedure: TRANSESOPHAGEAL ECHOCARDIOGRAM (TEE);  Surgeon: Lewayne Rose, Lawrence S, MD;  [facility];  Service: Cardiovascular;  Laterality: N/A;   TEE WITHOUT CARDIOVERSION N/A 09/01/2020   Procedure: TRANSESOPHAGEAL ECHOCARDIOGRAM (TEE);  Surgeon: Kathleene Rose, Lawrence D, MD;  [facility];  Service: Open Heart Surgery;  Laterality: N/A;   TRANSCATHETER AORTIC VALVE REPLACEMENT, TRANSFEMORAL N/A 09/01/2020   Procedure: TRANSCATHETER AORTIC VALVE REPLACEMENT, TRANSFEMORAL;  Surgeon: Kathleene Rose, Lawrence D, MD;  Location: [facility] INVASIVE CV LAB;  Service: Open Heart Surgery;  Laterality: N/A;    Current Medications: Current Meds  Medication Sig  albuterol (VENTOLIN HFA) 108 (90 Base) MCG/ACT inhaler Inhale 2 puffs into the lungs every 4 (four) hours as needed for wheezing or shortness of breath.   amiodarone (PACERONE) 200 MG tablet Take 1 tablet by mouth once daily   apixaban (ELIQUIS) 5 MG TABS tablet Take 1 tablet by mouth twice daily   Blood Glucose Monitoring Suppl (FIFTY50 GLUCOSE METER 2.0) w/Device KIT 1 each by Other route in the morning, at noon, and at bedtime.   Cholecalciferol 25 MCG (1000 UT) tablet Take 1,000 Units by mouth at bedtime.    fluticasone (FLONASE) 50  MCG/ACT nasal spray Place 1 spray into both nostrils as needed for congestion.   gabapentin (NEURONTIN) 300 MG capsule Take 600 mg by mouth 2 (two) times daily.    HYDROcodone-acetaminophen (NORCO) 10-325 MG tablet Take 1 tablet by mouth every 6 (six) hours as needed for moderate pain.    insulin lispro (HUMALOG) 100 UNIT/ML injection Inject 8 Units into the skin 3 (three) times daily with meals.   Iron-Vitamins (GERITOL PO) Take 1 tablet by mouth daily.   LANTUS 100 UNIT/ML injection Inject 28 Units into the skin at bedtime.   latanoprost (XALATAN) 0.005 % ophthalmic solution Place 1 drop into both eyes at bedtime.    metoprolol tartrate (LOPRESSOR) 50 MG tablet Take 1 tablet (50 mg total) by mouth 2 (two) times daily.   Multiple Vitamin (MULTIVITAMIN WITH MINERALS) TABS tablet Take 1 tablet by mouth at bedtime.    neomycin-bacitracin-polymyxin (NEOSPORIN) [phone] ointment Apply 1 application topically 2 (two) times daily as needed (itching).   oxyCODONE ER (XTAMPZA ER) 9 MG C12A Take 9 mg by mouth every 12 (twelve) hours.   pantoprazole (PROTONIX) 40 MG tablet Take 40 mg by mouth 2 (two) times daily.   polyethylene glycol (MIRALAX / GLYCOLAX) 17 g packet Take 17 g by mouth daily.   PRECISION QID TEST test strip Use 1 strip to check blood sugar 4 times a day. Please dispense based on availability, insurance coverage, and patient preference.   PRESCRIPTION MEDICATION Inhale into the lungs at bedtime. CPAP   rosuvastatin (CRESTOR) 20 MG tablet Take 20 mg by mouth daily.    senna-docusate (SENOKOT-S) 8.6-50 MG tablet Take 1 tablet by mouth daily.   Torsemide 40 MG TABS Take 1 tablet by mouth daily.     Allergies:   Cocoa, Sulfamethoxazole-trimethoprim, Chocolate, Citrus, Fish allergy, Shellfish allergy, Sulfa antibiotics, and Tetracycline   Social History   Socioeconomic History   Marital status: Single    Spouse name: Not on file   Number of children: 0   Years of education: Not on file    Highest education level: Not on file  Occupational History   Occupation: retired  Tobacco Use   Smoking status: Former    Packs/day: 1.00    Years: 15.00    Total pack years: 15.00    Types: Cigarettes    Quit date: 10/17/1988    Years since quitting: 34.0   Smokeless tobacco: Never   Tobacco comments:    per patient quit over 30 years ago  Vaping Use   Vaping Use: Never used  Substance and Sexual Activity   Alcohol use: Not Currently   Drug use: Not Currently   Sexual activity: Not on file  Other Topics Concern   Not on file  Social History Narrative   Not on file   Social Determinants of Health   Financial Resource Strain: Medium Risk (09/27/2021)   Overall Financial Resource  Strain (CARDIA)    Difficulty of Paying Living Expenses: Somewhat hard  Food Insecurity: No Food Insecurity (09/27/2021)   Hunger Vital Sign    Worried About Running Out of Food in the Last Year: Never true    Ran Out of Food in the Last Year: Never true  Transportation Needs: No Transportation Needs (09/27/2021)   PRAPARE - Administrator, Civil ServiceTransportation    Lack of Transportation (Medical): No    Lack of Transportation (Non-Medical): No  Physical Activity: Not on file  Stress: Not on file  Social Connections: Not on file    Social:  Malachy ChamberCowboys Fan always comes with his friend, Shamea's Cousin  Family History: History of coronary artery disease notable for no members. History of heart failure notable for no members. History of arrhythmia notable for no members.  ROS:   Please see the history of present illness.    All other systems reviewed and are negative.  EKGs/Labs/Other Studies Reviewed:    The following studies were reviewed today:  EKG:   10/24/22: SR 63  LVH with repol 05/16/22: Sinus bradycardia rate 54  12/24/2020: AFl 114  10/29/20: Atrial Flutter rate 102 10/14/20  Atrial Flutter heart rate 98  Cardiac Studies & Procedures   CARDIAC CATHETERIZATION  CARDIAC CATHETERIZATION  08/18/2020  Narrative  Widely patent coronary arteries  High cardiac output: 9.06 L/min by Fick and 9.23 L/min by thermal dilution.  Etiology uncertain.  Suppressed TSH with normal T4 raising question of T3 thyrotoxicosis versus other mechanism.  Severe pulmonary hypertension, mean pressure 48 mmHg, based upon hemodynamics WHO group 2  Pulmonary capillary wedge pressure mean 34 mmHg, pulmonary vascular resistance 1.55 Woods units.  Mean right atrial pressure 19 mmHg  Aortic valve gradient peak to peak 33 mmHg with mean gradient 22 mmHg.  Aortic valve area 1.94 cm by Fick cardiac output and 1.98 cm by thermodilution  LV function normal by echocardiography with moderate LVH  RECOMMENDATIONS:   Further work-up/management per treating team.  Needs further diuresis.  T3 level  Findings Coronary Findings Diagnostic  Dominance: Right  Left Anterior Descending The vessel exhibits minimal luminal irregularities.  Left Circumflex Mid Cx to Dist Cx lesion is 25% stenosed.  Right Coronary Artery The vessel exhibits minimal luminal irregularities. Prox RCA lesion is 30% stenosed.  Intervention  No interventions have been documented.     ECHOCARDIOGRAM  ECHOCARDIOGRAM LIMITED 09/27/2021  Narrative ECHOCARDIOGRAM LIMITED REPORT    Patient Name:   Lawrence CallerJAMES Pinkley Date of Exam: 09/27/2021 Medical Rec #:  [id number]      Height:       71.0 in Accession #:    [id number]     Weight:       271.4 lb Date of Birth:  Mar 23, 1956      BSA:          2.401 m Patient Age:    67 years       BP:           108/53 mmHg Patient Gender: M              HR:           62 bpm. Exam Location:  Inpatient  Procedure: Limited Echo and Limited Color Doppler  Indications:    CHF  History:        Patient has prior history of Echocardiogram examinations, most recent 08/26/2021. CHF, Arrythmias:Atrial Flutter, Signs/Symptoms:Chest Pain; Risk Factors:Hypertension and Diabetes. 29mm Medtronic  Evolit Aortic valve.  Sonographer:    Nicole Cellaorothy  [facility] Referring Phys: [id number] Lawrence L SCHUMANN  IMPRESSIONS   1. Left ventricular ejection fraction, by estimation, is 60 to 65%. The left ventricle has normal function. The left ventricle has no regional wall motion abnormalities. There is mild left ventricular hypertrophy. Left ventricular diastolic parameters were normal. 2. Right ventricular systolic function is normal. The right ventricular size is normal. 3. Left atrial size was moderately dilated. 4. Right atrial size was moderately dilated. 5. The pericardial effusion is lateral to the left ventricle. 6. The mitral valve is degenerative. No evidence of mitral valve regurgitation. No evidence of mitral stenosis. Moderate mitral annular calcification. 7. Post 29 mm Medtronic supra annular Core valve stable gradients since 01/19/21 trivial AR likely PVL . The aortic valve has been repaired/replaced. Aortic valve regurgitation is trivial. No aortic stenosis is present. 8. The inferior vena cava is normal in size with greater than 50% respiratory variability, suggesting right atrial pressure of 3 mmHg.  FINDINGS Left Ventricle: Left ventricular ejection fraction, by estimation, is 60 to 65%. The left ventricle has normal function. The left ventricle has no regional wall motion abnormalities. The left ventricular internal cavity size was normal in size. There is mild left ventricular hypertrophy. Left ventricular diastolic parameters were normal.  Right Ventricle: The right ventricular size is normal. No increase in right ventricular wall thickness. Right ventricular systolic function is normal.  Left Atrium: Left atrial size was moderately dilated.  Right Atrium: Right atrial size was moderately dilated.  Pericardium: Trivial pericardial effusion is present. The pericardial effusion is lateral to the left ventricle.  Mitral Valve: The mitral valve is degenerative in appearance.  There is mild thickening of the mitral valve leaflet(s). There is mild calcification of the mitral valve leaflet(s). Moderate mitral annular calcification. No evidence of mitral valve stenosis.  Tricuspid Valve: The tricuspid valve is normal in structure. Tricuspid valve regurgitation is not demonstrated. No evidence of tricuspid stenosis.  Aortic Valve: Post 29 mm Medtronic supra annular Core valve stable gradients since 01/19/21 trivial AR likely PVL. The aortic valve has been repaired/replaced. Aortic valve regurgitation is trivial. No aortic stenosis is present. Aortic valve mean gradient measures 15.0 mmHg. Aortic valve peak gradient measures 25.2 mmHg. Aortic valve area, by VTI measures 1.36 cm.  Pulmonic Valve: The pulmonic valve was normal in structure. Pulmonic valve regurgitation is not visualized. No evidence of pulmonic stenosis.  Aorta: The aortic root is normal in size and structure.  Venous: The inferior vena cava is normal in size with greater than 50% respiratory variability, suggesting right atrial pressure of 3 mmHg.  IAS/Shunts: No atrial level shunt detected by color flow Doppler.  LEFT VENTRICLE PLAX 2D LVIDd:         4.30 cm LVIDs:         2.30 cm LV PW:         1.50 cm LV IVS:        1.40 cm LVOT diam:     1.90 cm LV SV:         88 LV SV Index:   36 LVOT Area:     2.84 cm  LV Volumes (MOD) LV vol d, MOD A2C: 89.6 ml LV vol d, MOD A4C: 84.9 ml LV vol s, MOD A2C: 41.1 ml LV vol s, MOD A4C: 35.4 ml LV SV MOD A2C:     48.5 ml LV SV MOD A4C:     84.9 ml LV SV MOD BP:      51.3 ml  LEFT ATRIUM         Index LA diam:    5.40 cm 2.25 cm/m AORTIC VALVE AV Area (Vmax):    1.39 cm AV Area (Vmean):   1.26 cm AV Area (VTI):     1.36 cm AV Vmax:           251.00 cm/s AV Vmean:          182.000 cm/s AV VTI:            0.644 m AV Peak Grad:      25.2 mmHg AV Mean Grad:      15.0 mmHg LVOT Vmax:         123.00 cm/s LVOT Vmean:        80.800 cm/s LVOT VTI:           0.309 m LVOT/AV VTI ratio: 0.48   SHUNTS Systemic VTI:  0.31 m Systemic Diam: 1.90 cm  Charlton Haws MD Electronically signed by Charlton Haws MD Signature Date/Time: 09/27/2021/4:34:21 PM    Final   TEE  ECHO TEE 08/21/2020  Narrative TRANSESOPHOGEAL ECHO REPORT    Patient Name:   ARAF CLUGSTON Date of Exam: 08/21/2020 Medical Rec #:  [id number]      Height:       71.0 in Accession #:    [id number]     Weight:       303.2 lb Date of Birth:  Dec 05, 1955      BSA:          2.516 m Patient Age:    64 years       BP:           149/75 mmHg Patient Gender: M              HR:           87 bpm. Exam Location:  Inpatient  Procedure: Transesophageal Echo, Cardiac Doppler and Color Doppler  Indications:     I35.0 Nonrheumatic aortic (valve) stenosis  History:         Patient has prior history of Echocardiogram examinations, most recent 08/17/2020. Previous Myocardial Infarction and CAD, Aortic Valve Disease; Risk Factors:Sleep Apnea. Severe aortic stenosis.  Sonographer:     Sheralyn Boatman RDCS Referring Phys:  [id number] Wille Celeste THOMPSON Diagnosing Phys: Olga Millers MD  PROCEDURE: After discussion of the risks and benefits of a TEE, an informed consent was obtained from the patient. The transesophogeal probe was passed without difficulty through the esophogus of the patient. Imaged were obtained with the patient in a left lateral decubitus position. Sedation performed by different physician. The patient was monitored while under deep sedation. Anesthestetic sedation was provided intravenously by Anesthesiology: 300mg  of Propofol. The patient's vital signs; including heart rate, blood pressure, and oxygen saturation; remained stable throughout the procedure. The patient developed no complications during the procedure.  IMPRESSIONS   1. Severe AS (mean gradient 42 mmHg and peak velocity of 4.2 m/s); mild AI. 2. Left ventricular ejection fraction, by estimation, is 70 to 75%.  The left ventricle has hyperdynamic function. There is severe left ventricular hypertrophy. 3. Right ventricular systolic function is normal. The right ventricular size is normal. 4. Left atrial size was moderately dilated. No left atrial/left atrial appendage thrombus was detected. 5. The mitral valve is normal in structure. Trivial mitral valve regurgitation. 6. The aortic valve is tricuspid. Aortic valve regurgitation is mild. Severe aortic valve stenosis. 7. There is Moderate (Grade III) plaque involving the descending aorta.  FINDINGS Left Ventricle: Left ventricular ejection fraction, by estimation, is 70 to 75%. The left ventricle has hyperdynamic function. The left ventricular internal cavity size was normal in size. There is severe left ventricular hypertrophy.  Right Ventricle: The right ventricular size is normal. Right vetricular wall thickness was not assessed. Right ventricular systolic function is normal.  Left Atrium: Left atrial size was moderately dilated. No left atrial/left atrial appendage thrombus was detected.  Right Atrium: Right atrial size was normal in size.  Pericardium: There is no evidence of pericardial effusion.  Mitral Valve: The mitral valve is normal in structure. Mild mitral annular calcification. Trivial mitral valve regurgitation. MV peak gradient, 5.8 mmHg. The mean mitral valve gradient is 2.0 mmHg.  Tricuspid Valve: The tricuspid valve is normal in structure. Tricuspid valve regurgitation is mild.  Aortic Valve: The aortic valve is tricuspid. Aortic valve regurgitation is mild. Severe aortic stenosis is present. Aortic valve mean gradient measures 41.5 mmHg. Aortic valve peak gradient measures 66.7 mmHg. Aortic valve area, by VTI measures 1.04 cm.  Pulmonic Valve: The pulmonic valve was normal in structure. Pulmonic valve regurgitation is not visualized.  Aorta: The aortic root is normal in size and structure. There is moderate (Grade III) plaque  involving the descending aorta.  IAS/Shunts: No atrial level shunt detected by color flow Doppler.  Additional Comments: Severe AS (mean gradient 42 mmHg and peak velocity of 4.2 m/s); mild AI.   LEFT VENTRICLE PLAX 2D LVOT diam:     2.20 cm LV SV:         81 LV SV Index:   32 LVOT Area:     3.80 cm   AORTIC VALVE AV Area (Vmax):    1.01 cm AV Area (Vmean):   0.92 cm AV Area (VTI):     1.04 cm AV Vmax:           408.50 cm/s AV Vmean:          307.500 cm/s AV VTI:            0.778 m AV Peak Grad:      66.7 mmHg AV Mean Grad:      41.5 mmHg LVOT Vmax:         108.00 cm/s LVOT Vmean:        74.800 cm/s LVOT VTI:          0.214 m LVOT/AV VTI ratio: 0.27  MITRAL VALVE MV Peak grad: 5.8 mmHg  SHUNTS MV Mean grad: 2.0 mmHg  Systemic VTI:  0.21 m MV Vmax:      1.20 m/s  Systemic Diam: 2.20 cm MV Vmean:     60.7 cm/s  Olga Millers MD Electronically signed by Olga Millers MD Signature Date/Time: 08/21/2020/9:21:38 AM    Final    CT SCANS  CT CORONARY MORPH W/CTA COR W/SCORE 08/20/2020  Addendum 08/20/2020  8:04 AM ADDENDUM REPORT: 08/20/2020 08:01  EXAM: OVER-READ INTERPRETATION  CT CHEST  The following report is an over-read performed by radiologist Dr. Royal Piedra [facility] on 08/20/2020. This over-read does not include interpretation of cardiac or coronary anatomy or pathology. The coronary calcium score and cardiac CTA interpretation by the cardiologist is attached.  COMPARISON:  Chest CTA 08/15/2020.  FINDINGS: Extracardiac findings will be described separately under dictation for contemporaneously obtained CTA chest, abdomen and pelvis.  IMPRESSION: Please see separate dictation for contemporaneously obtained CTA chest, abdomen and pelvis 08/19/2020 for full description of relevant extracardiac findings.   Electronically Signed By:  Vinnie Langton M.D. On: 08/20/2020  08:01  Narrative MEDICATIONS: MEDICATIONS None  EXAM: Cardiac TAVR CT  TECHNIQUE: The patient was scanned on a Siemens Force 734 slice scanner. A 120 kV retrospective scan was triggered in the descending thoracic aorta at 111 HU's. Gantry rotation speed was 270 msecs and collimation was .9 mm. No beta blockade or nitro were given. The 3D data set was reconstructed in 5% intervals of the R-R cycle. Systolic and diastolic phases were analyzed on a dedicated work station using MPR, MIP and VRT modes. The patient received 80 cc of contrast.  FINDINGS: Aortic Valve: Calcium score 1645 Tri leaflet with restricted motion  Aorta: No aneurysm moderate calcific atherosclerosis normal arch vessels  Sino-tubular Junction: 25 mm  Ascending Thoracic Aorta: 33 mm  Aortic Arch: 25 mm  Descending Thoracic Aorta: 25 mm  Sinus of Valsalva Measurements:  Non-coronary: 30.8 mm  Right - coronary: 28.2 mm  Left - coronary: 31.3 mm  Coronary Artery Height above Annulus:  Left Main: 11.1 mm above annulus  Right Coronary: 15.3 mm above annulus  Virtual Basal Annulus Measurements:  Maximum/Minimum Diameter: 27.4 mm x 22.8 mm  Perimeter: 82 mm  Area: 505 mm2  Coronary Arteries: Sufficient height above annulus for deployment  Optimum Fluoroscopic Angle for Delivery: LAO 3 Caudal 6 degrees  IMPRESSION: 1. Tri-leaflet AV with calcium score 1645 and annular area of 505 mm2 suitable for a 26 mm Sapien 3 valve  2.  Coronary arteries sufficient height above annulus for deployment  3.  Optimum angiographic angle for deployment LAO 3 Caudal 6 degrees  4. Some nodular calcification in annulus at base of left coronary cusp  5.  Normal aortic root 3.3 cm  Jenkins Rouge  Electronically Signed: By: Jenkins Rouge M.D. On: 08/19/2020 18:00            Recent Labs: No results found for requested labs within last 365 days.  Recent Lipid Panel    Component Value Date/Time   CHOL  127 08/17/2020 0351   TRIG 69 08/17/2020 0351   HDL 39 (L) 08/17/2020 0351   CHOLHDL 3.3 08/17/2020 0351   VLDL 14 08/17/2020 0351   LDLCALC 74 08/17/2020 0351    Physical Exam:    VS:  BP (!) 102/56   Pulse 63   Ht 5\' 10"  (1.778 m)   Wt (!) 305 lb 12.8 oz (138.7 kg)   SpO2 95%   BMI 43.88 kg/m     Wt Readings from Last 3 Encounters:  10/24/22 (!) 305 lb 12.8 oz (138.7 kg)  05/16/22 286 lb (129.7 kg)  11/17/21 299 lb (135.6 kg)    Gen: No distress  Neck: No JVD Ears: No Pilar Plate Sign Cardiac: No rubs or gallops, systolic ejection murmur and diastolic murmur, regular rhythm, +2 radial pulses Respiratory: Late expiratory wheeze left lower lung field, normal effort, normal  respiratory rate GI: Soft, nontender, distended MS: no edema;  moves all extremities Integument: Skin feels warm; chronic skin changes of edema with no pitting Neuro:  At time of evaluation, alert and oriented to person/place/time/situation  Psych: Normal affect, patient feels OK  ASSESSMENT:    1. Atrial flutter, unspecified type ([place])   2. Medication management   3. Chronic heart failure with preserved ejection fraction (HCC)     PLAN:    HFpEF- chronic HTN CKD Stage IIIa - Diuretic torsemide 40 mg PO daily; given weight gain and recent change will add BNP - Nephrologist is Dr.  Lufadeju - not on ACEI becase of AKI - not on SGLTi because of UTI's  Persistent Atrial Flutter OSA on CPAP Morbid Obesity - CHADSVASC=3. - Continue anticoagulation with eliquis.  - Continue rate control with metoprolol  - TSH, CMP, PFTs for amiodarone, yearly eye testing in 2024 - Rhythm control options:  Continue amiodarone 200 mg PO daily   Severe Aortic Stenosis s/p TAVR trivial PVL - minimal residual gradient without evidence of significant paravalvular leak - antithrombotic plan: Eliquis - IE prophylasxis: augmentin 2 g PO PRN dental procedure - Has repeat echo 10/26/2022  Six months unless HF  sx   Medication Adjustments/Labs and Tests Ordered: Current medicines are reviewed at length with the patient today.  Concerns regarding medicines are outlined above.  Orders Placed This Encounter  Procedures   TSH   Comprehensive metabolic panel   Pro b natriuretic peptide (BNP)   EKG 12-Lead   Pulmonary Function Test   No orders of the defined types were placed in this encounter.   Patient Instructions  Medication Instructions:  Your physician recommends that you continue on your current medications as directed. Please refer to the Current Medication list given to you today.  *If you need a refill on your cardiac medications before your next appointment, please call your pharmacy*   Lab Work: TODAY: BNP, CMP, TSH  If you have labs (blood work) drawn today and your tests are completely normal, you will receive your results only by: MyChart Message (if you have MyChart) OR A paper copy in the mail If you have any lab test that is abnormal or we need to change your treatment, we will call you to review the results.   Testing/Procedures: Your physician has recommended that you have a pulmonary function test. Pulmonary Function Tests are a group of tests that measure how well air moves in and out of your lungs.  Patient Instructions for Pulmonary Function Test  Do not smoke within at least 1 hour before the test. Do not consume Caffeine 4 hours prior to the test. Do not consume Alcohol within 4 hours prior to testing. Do not perform any Vigorous Exercise within 30 minutes before the test. Do not wear clothes that restrict the chest area or abdomen. Do not use albuterol or Xopenex 3 hours before the test or any other nebulizer medications or inhalers.    Follow-Up: At [facility], you and your health needs are our priority.  As part of our continuing mission to provide you with exceptional heart care, we have created designated Provider Care Teams.  These Care Teams  include your primary Cardiologist (physician) and Advanced Practice Providers (APPs -  Physician Assistants and Nurse Practitioners) who all work together to provide you with the care you need, when you need it.  We recommend signing up for the patient portal called "MyChart".  Sign up information is provided on this After Visit Summary.  MyChart is used to connect with patients for Virtual Visits (Telemedicine).  Patients are able to view lab/test results, encounter notes, upcoming appointments, etc.  Non-urgent messages can be sent to your provider as well.   To learn more about what you can do with MyChart, go to [email or web address].    Your next appointment:   6 month(s)  The format for your next appointment:   In Person  Provider:   Christell Constant, MD      Important Information About Sugar         Signed, Ajanae Virag  Richardean ChimeraA Charidy Cappelletti, MD  10/24/2022 9:33 AM    [facility]

## 2022-10-24 NOTE — Patient Instructions (Signed)
Medication Instructions:  Your physician recommends that you continue on your current medications as directed. Please refer to the Current Medication list given to you today.  *If you need a refill on your cardiac medications before your next appointment, please call your pharmacy*   Lab Work: TODAY: BNP, CMP, TSH  If you have labs (blood work) drawn today and your tests are completely normal, you will receive your results only by: [place] (if you have MyChart) OR A paper copy in the mail If you have any lab test that is abnormal or we need to change your treatment, we will call you to review the results.   Testing/Procedures: Your physician has recommended that you have a pulmonary function test. Pulmonary Function Tests are a group of tests that measure how well air moves in and out of your lungs.  Patient Instructions for Pulmonary Function Test  Do not smoke within at least 1 hour before the test. Do not consume Caffeine 4 hours prior to the test. Do not consume Alcohol within 4 hours prior to testing. Do not perform any Vigorous Exercise within 30 minutes before the test. Do not wear clothes that restrict the chest area or abdomen. Do not use albuterol or Xopenex 3 hours before the test or any other nebulizer medications or inhalers.    Follow-Up: At [facility], you and your health needs are our priority.  As part of our continuing mission to provide you with exceptional heart care, we have created designated Provider Care Teams.  These Care Teams include your primary Cardiologist (physician) and Advanced Practice Providers (APPs -  Physician Assistants and Nurse Practitioners) who all work together to provide you with the care you need, when you need it.  We recommend signing up for the patient portal called "MyChart".  Sign up information is provided on this After Visit Summary.  MyChart is used to connect with patients for Virtual Visits (Telemedicine).   Patients are able to view lab/test results, encounter notes, upcoming appointments, etc.  Non-urgent messages can be sent to your provider as well.   To learn more about what you can do with MyChart, go to [email or web address].    Your next appointment:   6 month(s)  The format for your next appointment:   In Person  Provider:   Werner Lean, MD      Important Information About Sugar

## 2022-10-25 LAB — COMPREHENSIVE METABOLIC PANEL
ALT: 17 IU/L (ref 0–44)
AST: 17 IU/L (ref 0–40)
Albumin/Globulin Ratio: 1 — ABNORMAL LOW (ref 1.2–2.2)
Albumin: 3.6 g/dL — ABNORMAL LOW (ref 3.9–4.9)
Alkaline Phosphatase: 76 IU/L (ref 44–121)
BUN/Creatinine Ratio: 19 (ref 10–24)
BUN: 46 mg/dL — ABNORMAL HIGH (ref 8–27)
Bilirubin Total: 0.5 mg/dL (ref 0.0–1.2)
CO2: 25 mmol/L (ref 20–29)
Calcium: 9.1 mg/dL (ref 8.6–10.2)
Chloride: 94 mmol/L — ABNORMAL LOW (ref 96–106)
Creatinine, Ser: 2.45 mg/dL — ABNORMAL HIGH (ref 0.76–1.27)
Globulin, Total: 3.6 g/dL (ref 1.5–4.5)
Glucose: 124 mg/dL — ABNORMAL HIGH (ref 70–99)
Potassium: 4.1 mmol/L (ref 3.5–5.2)
Sodium: 138 mmol/L (ref 134–144)
Total Protein: 7.2 g/dL (ref 6.0–8.5)
eGFR: 28 mL/min/{1.73_m2} — ABNORMAL LOW (ref >59)

## 2022-10-25 LAB — PRO B NATRIURETIC PEPTIDE: NT-Pro BNP: 219 pg/mL (ref 0–376)

## 2022-10-25 LAB — TSH: TSH: 0.9 u[IU]/mL (ref 0.450–4.500)

## 2022-10-26 ENCOUNTER — Ambulatory Visit (HOSPITAL_COMMUNITY): Payer: Medicare HMO | Attending: Internal Medicine

## 2022-10-26 DIAGNOSIS — I350 Nonrheumatic aortic (valve) stenosis: Secondary | ICD-10-CM | POA: Diagnosis not present

## 2022-10-26 LAB — ECHOCARDIOGRAM COMPLETE
AR max vel: 1.68 cm2
AV Area VTI: 1.54 cm2
AV Area mean vel: 1.59 cm2
AV Mean grad: 17.3 mmHg
AV Peak grad: 32.4 mmHg
Ao pk vel: 2.85 m/s
Area-P 1/2: 2.96 cm2
S' Lateral: 2.8 cm

## 2022-11-09 ENCOUNTER — Ambulatory Visit (INDEPENDENT_AMBULATORY_CARE_PROVIDER_SITE_OTHER): Payer: Medicare HMO | Admitting: Pulmonary Disease

## 2022-11-09 DIAGNOSIS — Z79899 Other long term (current) drug therapy: Secondary | ICD-10-CM

## 2022-11-09 DIAGNOSIS — I4892 Unspecified atrial flutter: Secondary | ICD-10-CM

## 2022-11-09 DIAGNOSIS — I5032 Chronic diastolic (congestive) heart failure: Secondary | ICD-10-CM

## 2022-11-09 LAB — PULMONARY FUNCTION TEST
DL/VA % pred: 135 %
DL/VA: 5.48 ml/min/mmHg/L
DLCO cor % pred: 86 %
DLCO cor: 23.27 ml/min/mmHg
DLCO unc % pred: 67 %
DLCO unc: 18.19 ml/min/mmHg
FEF 25-75 Post: 2.01 L/sec
FEF 25-75 Pre: 1.68 L/sec
FEF2575-%Change-Post: 19 %
FEF2575-%Pred-Post: 76 %
FEF2575-%Pred-Pre: 64 %
FEV1-%Change-Post: 4 %
FEV1-%Pred-Post: 60 %
FEV1-%Pred-Pre: 58 %
FEV1-Post: 2.08 L
FEV1-Pre: 2 L
FEV1FVC-%Change-Post: 3 %
FEV1FVC-%Pred-Pre: 105 %
FEV6-%Change-Post: 1 %
FEV6-%Pred-Post: 59 %
FEV6-%Pred-Pre: 58 %
FEV6-Post: 2.59 L
FEV6-Pre: 2.56 L
FEV6FVC-%Pred-Post: 105 %
FEV6FVC-%Pred-Pre: 105 %
FVC-%Change-Post: 0 %
FVC-%Pred-Post: 55 %
FVC-%Pred-Pre: 55 %
FVC-Post: 2.59 L
FVC-Pre: 2.57 L
Post FEV1/FVC ratio: 80 %
Post FEV6/FVC ratio: 100 %
Pre FEV1/FVC ratio: 78 %
Pre FEV6/FVC Ratio: 100 %
RV % pred: 95 %
RV: 2.37 L
TLC % pred: 71 %
TLC: 5.17 L

## 2022-11-09 NOTE — Patient Instructions (Signed)
Full PFT performed today.

## 2022-11-09 NOTE — Progress Notes (Signed)
Full PFT performed today.

## 2022-11-22 ENCOUNTER — Other Ambulatory Visit: Payer: Self-pay | Admitting: Internal Medicine

## 2022-11-22 DIAGNOSIS — I4892 Unspecified atrial flutter: Secondary | ICD-10-CM

## 2022-11-22 NOTE — Telephone Encounter (Signed)
Prescription refill request for Eliquis received. Indication:aflutter Last office visit:1/24 Scr:2.3 1/24 Age: 67 Weight:138.7  kg  Prescription refilled

## 2022-12-21 ENCOUNTER — Telehealth: Payer: Self-pay | Admitting: Pulmonary Disease

## 2022-12-21 NOTE — Telephone Encounter (Signed)
Need RX sent in for 2 LT's per minute during sleep vis CPAP. Dr. Ander Slade was last seen. [employer] [facility].  Pls send in to [employer] @ fax [phone]

## 2022-12-23 NOTE — Telephone Encounter (Signed)
Patient has not been seen in 2 years. He has an appt with Dr. Ander Slade in 2 weeks. Order can be placed during the visit.

## 2022-12-27 ENCOUNTER — Other Ambulatory Visit: Payer: Self-pay | Admitting: Physician Assistant

## 2023-01-04 ENCOUNTER — Encounter: Payer: Self-pay | Admitting: Pulmonary Disease

## 2023-01-04 ENCOUNTER — Ambulatory Visit: Payer: Medicare HMO | Admitting: Pulmonary Disease

## 2023-01-04 VITALS — BP 132/76 | HR 65 | Ht 71.0 in | Wt 305.0 lb

## 2023-01-04 DIAGNOSIS — G4733 Obstructive sleep apnea (adult) (pediatric): Secondary | ICD-10-CM

## 2023-01-04 NOTE — Progress Notes (Signed)
Lawrence Rose    [id number]    Nov 06, 1955  Primary Care Physician:Silva Ocie Bob, MD  Referring Physician: [name], [name], MD [address] [place],  [place] [address]  Chief complaint:   Patient being seen for obstructive sleep apnea  HPI:  Diagnosed with obstructive sleep apnea about 10 years ago Has been using CPAP on a regular basis  He was told recently he needed oxygen added to his CPAP machine following an overnight oximetry with CPAP in place  He is not sure of his sentence  He was using the DME company that was brought over by [employer]  He is compliant with CPAP and does use it nightly  Reviewing his records there is documentation about good compliance  Usually tries to go to bed between 10 and 11 PM, falls asleep soon after laying down Final wake up time about 7 AM 2-3 awakenings to use the bathroom  Is short of breath with exertion  Has a history of severe aortic stenosis, pulmonary hypertension, atrial flutter, diabetes, chronic kidney disease, chronic pain syndrome  Is s/p TAVR   Outpatient Encounter Medications as of 01/04/2023  Medication Sig   albuterol (VENTOLIN HFA) 108 (90 Base) MCG/ACT inhaler Inhale 2 puffs into the lungs every 4 (four) hours as needed for wheezing or shortness of breath.   amiodarone (PACERONE) 200 MG tablet Take 1 tablet by mouth once daily   apixaban (ELIQUIS) 5 MG TABS tablet Take 1 tablet by mouth twice daily   Blood Glucose Monitoring Suppl (FIFTY50 GLUCOSE METER 2.0) w/Device KIT 1 each by Other route in the morning, at noon, and at bedtime.   Cholecalciferol 25 MCG (1000 UT) tablet Take 1,000 Units by mouth at bedtime.    fluticasone (FLONASE) 50 MCG/ACT nasal spray Place 1 spray into both nostrils as needed for congestion.   gabapentin (NEURONTIN) 300 MG capsule Take 600 mg by mouth 2 (two) times daily.    HYDROcodone-acetaminophen (NORCO) 10-325 MG tablet Take 1 tablet by mouth every 6 (six)  hours as needed for moderate pain.    insulin lispro (HUMALOG) 100 UNIT/ML injection Inject 8 Units into the skin 3 (three) times daily with meals.   Iron-Vitamins (GERITOL PO) Take 1 tablet by mouth daily.   LANTUS 100 UNIT/ML injection Inject 28 Units into the skin at bedtime.   latanoprost (XALATAN) 0.005 % ophthalmic solution Place 1 drop into both eyes at bedtime.    metoprolol tartrate (LOPRESSOR) 50 MG tablet Take 1 tablet (50 mg total) by mouth 2 (two) times daily.   Multiple Vitamin (MULTIVITAMIN WITH MINERALS) TABS tablet Take 1 tablet by mouth at bedtime.    neomycin-bacitracin-polymyxin (NEOSPORIN) [phone] ointment Apply 1 application topically 2 (two) times daily as needed (itching).   oxyCODONE ER (XTAMPZA ER) 9 MG C12A Take 9 mg by mouth every 12 (twelve) hours.   pantoprazole (PROTONIX) 40 MG tablet Take 40 mg by mouth 2 (two) times daily.   polyethylene glycol (MIRALAX / GLYCOLAX) 17 g packet Take 17 g by mouth daily.   PRECISION QID TEST test strip Use 1 strip to check blood sugar 4 times a day. Please dispense based on availability, insurance coverage, and patient preference.   PRESCRIPTION MEDICATION Inhale into the lungs at bedtime. CPAP   rosuvastatin (CRESTOR) 20 MG tablet Take 20 mg by mouth daily.    senna-docusate (SENOKOT-S) 8.6-50 MG tablet Take 1 tablet by mouth daily.   Torsemide  40 MG TABS Take 1 tablet by mouth daily.   No facility-administered encounter medications on file as of 01/04/2023.    Allergies as of 01/04/2023 - Review Complete 01/04/2023  Allergen Reaction Noted   Cocoa Hives 10/28/2014   Sulfamethoxazole-trimethoprim Anaphylaxis 07/22/2014   Chocolate Hives 06/08/2014   Citrus Hives 08/16/2020   Fish allergy Hives and Swelling 06/08/2014   Shellfish allergy Hives and Swelling 08/16/2020   Sulfa antibiotics Hives and Swelling 06/08/2014   Tetracycline Swelling 01/11/2016    Past Medical History:  Diagnosis Date   Acid reflux    Allergic  arthritis, hand    per patient, both hands   Anemia    Apnea    Asthma    Carpal tunnel syndrome    per patient, both hands   Chronic back pain    Chronic kidney disease    Chronic knee pain    Diabetes mellitus without complication ([place])    Dysrhythmia    GI bleeding 09/2020   Hypertension    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 Angelena Form and Dr. Cyndia Bent.    Severe aortic stenosis    Sleep apnea    wears CPAP every night    Past Surgical History:  Procedure Laterality Date   BACK SURGERY     CARDIOVERSION  01/18/2021   CARDIOVERSION N/A 01/18/2021   Procedure: CARDIOVERSION;  Surgeon: Werner Lean, MD;  Location: [facility];  Service: Cardiovascular;  Laterality: N/A;   ESOPHAGOGASTRODUODENOSCOPY (EGD) WITH PROPOFOL N/A 10/01/2020   Procedure: ESOPHAGOGASTRODUODENOSCOPY (EGD) WITH PROPOFOL;  Surgeon: [name], MD;  Location: [facility];  Service: Endoscopy;  Laterality: N/A;   RIGHT/LEFT HEART CATH AND CORONARY ANGIOGRAPHY N/A 08/18/2020   Procedure: RIGHT/LEFT HEART CATH AND CORONARY ANGIOGRAPHY;  Surgeon: [name], MD;  Location: [facility];  Service: Cardiovascular;  Laterality: N/A;   TEE WITHOUT CARDIOVERSION N/A 08/21/2020   Procedure: TRANSESOPHAGEAL ECHOCARDIOGRAM (TEE);  Surgeon: [name], MD;  Location: [facility] ENDOSCOPY;  Service: Cardiovascular;  Laterality: N/A;   TEE WITHOUT CARDIOVERSION N/A 09/01/2020   Procedure: TRANSESOPHAGEAL ECHOCARDIOGRAM (TEE);  Surgeon: [name], MD;  Location: [facility];  Service: Open Heart Surgery;  Laterality: N/A;   TRANSCATHETER AORTIC VALVE REPLACEMENT, TRANSFEMORAL N/A 09/01/2020   Procedure: TRANSCATHETER AORTIC VALVE REPLACEMENT, TRANSFEMORAL;  Surgeon: [name], MD;  Location: [facility];  Service: Open Heart Surgery;  Laterality: N/A;    No  family history on file.  Social History   Socioeconomic History   Marital status: Single    Spouse name: Not on file   Number of children: 0   Years of education: Not on file   Highest education level: Not on file  Occupational History   Occupation: retired  Tobacco Use   Smoking status: Former    Packs/day: 1.00    Years: 15.00    Additional pack years: 0.00    Total pack years: 15.00    Types: Cigarettes    Quit date: 10/17/1988    Years since quitting: 34.2   Smokeless tobacco: Never   Tobacco comments:    per patient quit over 30 years ago  Vaping Use   Vaping Use: Never used  Substance and Sexual Activity   Alcohol use: Not Currently   Drug use: Not Currently   Sexual activity: Not on file  Other Topics Concern  Not on file  Social History Narrative   Not on file   Social Determinants of Health   Financial Resource Strain: Medium Risk (09/27/2021)   Overall Financial Resource Strain (CARDIA)    Difficulty of Paying Living Expenses: Somewhat hard  Food Insecurity: No Food Insecurity (09/27/2021)   Hunger Vital Sign    Worried About Running Out of Food in the Last Year: Never true    Ran Out of Food in the Last Year: Never true  Transportation Needs: No Transportation Needs (09/27/2021)   PRAPARE - Hydrologist (Medical): No    Lack of Transportation (Non-Medical): No  Physical Activity: Not on file  Stress: Not on file  Social Connections: Not on file  Intimate Partner Violence: Not on file    Review of Systems  Constitutional:  Positive for fatigue.  Respiratory:  Positive for apnea.   Psychiatric/Behavioral:  Positive for sleep disturbance.     Vitals:   01/04/23 1606  BP: 132/76  Pulse: 65  SpO2: 97%   Physical Exam Constitutional:      Appearance: He is obese.  HENT:     Head: Normocephalic.     Nose: Nose normal.  Eyes:     General: No scleral icterus.    Pupils: Pupils are equal, round, and reactive to  light.  Cardiovascular:     Rate and Rhythm: Normal rate and regular rhythm.     Heart sounds: No murmur heard.    No friction rub.  Pulmonary:     Effort: No respiratory distress.     Breath sounds: No stridor. No wheezing or rhonchi.  Musculoskeletal:     Cervical back: No rigidity or tenderness.  Neurological:     Mental Status: He is alert.  Psychiatric:        Mood and Affect: Mood normal.    Data Reviewed: Previous sleep study not available for review  Could not find his actual study querying the EMR  Assessment:  History of obstructive sleep apnea on CPAP therapy  Settings of his CPAP is unknown at present  Nocturnal desaturations on a recent study-the study is not available at present as well  Class III obesity  History of pulmonary hypertension  Plan/Recommendations: Schedule the patient for split-night study  Encouraged to continue using CPAP at present  Weight loss efforts encouraged  Tentative follow-up following study  Encouraged to call with significant concerns   Sherrilyn Rist MD [facility] 01/04/2023, 4:36 PM  CC: Doreatha Lew, MD

## 2023-01-04 NOTE — Patient Instructions (Signed)
We will schedule you for split-night study  This is to ascertain the severity of the sleep apnea and put you on a CPAP or BiPAP with oxygen if needed during the study -This is the only way we can add oxygen to a CPAP  Tentative follow-up in 2 to 3 months  Call us with significant concerns

## 2023-01-19 ENCOUNTER — Telehealth: Payer: Self-pay | Admitting: *Deleted

## 2023-01-19 NOTE — Telephone Encounter (Signed)
Pt has been scheduled for a tele visit, 01/24/23 9:40.  Consent on file.  Pt will have to call back to go over medications, as he is down in his back and he just got comfortable and noone lives with him.    Patient Consent for Virtual Visit        Lawrence Rose has provided verbal consent on 01/19/2023 for a virtual visit (video or telephone).   CONSENT FOR VIRTUAL VISIT FOR:  Lawrence Rose  By participating in this virtual visit I agree to the following:  I hereby voluntarily request, consent and authorize [facility] and its employed or contracted physicians, physician assistants, nurse practitioners or other licensed health care professionals (the Practitioner), to provide me with telemedicine health care services (the "Services") as deemed necessary by the treating Practitioner. I acknowledge and consent to receive the Services by the Practitioner via telemedicine. I understand that the telemedicine visit will involve communicating with the Practitioner through live audiovisual communication technology and the disclosure of certain medical information by electronic transmission. I acknowledge that I have been given the opportunity to request an in-person assessment or other available alternative prior to the telemedicine visit and am voluntarily participating in the telemedicine visit.  I understand that I have the right to withhold or withdraw my consent to the use of telemedicine in the course of my care at any time, without affecting my right to future care or treatment, and that the Practitioner or I may terminate the telemedicine visit at any time. I understand that I have the right to inspect all information obtained and/or recorded in the course of the telemedicine visit and may receive copies of available information for a reasonable fee.  I understand that some of the potential risks of receiving the Services via telemedicine include:  Delay or interruption in medical evaluation  due to technological equipment failure or disruption; Information transmitted may not be sufficient (e.g. poor resolution of images) to allow for appropriate medical decision making by the Practitioner; and/or  In rare instances, security protocols could fail, causing a breach of personal health information.  Furthermore, I acknowledge that it is my responsibility to provide information about my medical history, conditions and care that is complete and accurate to the best of my ability. I acknowledge that Practitioner's advice, recommendations, and/or decision may be based on factors not within their control, such as incomplete or inaccurate data provided by me or distortions of diagnostic images or specimens that may result from electronic transmissions. I understand that the practice of medicine is not an exact science and that Practitioner makes no warranties or guarantees regarding treatment outcomes. I acknowledge that a copy of this consent can be made available to me via my patient portal ([facility]), or I can request a printed copy by calling the office of [facility].    I understand that my insurance will be billed for this visit.   I have read or had this consent read to me. I understand the contents of this consent, which adequately explains the benefits and risks of the Services being provided via telemedicine.  I have been provided ample opportunity to ask questions regarding this consent and the Services and have had my questions answered to my satisfaction. I give my informed consent for the services to be provided through the use of telemedicine in my medical care

## 2023-01-19 NOTE — Telephone Encounter (Signed)
   Name: Lawrence Rose  DOB: Jun 25, 1956  MRN: [id number]  Primary Cardiologist: Werner Lean, MD   Preoperative team, please contact this patient and set up a phone call appointment for further preoperative risk assessment. Please obtain consent and complete medication review. Thank you for your help.  I confirm that guidance regarding antiplatelet and oral anticoagulation therapy has been completed and, if necessary, noted below.   Per office protocol, patient can hold Eliquis for 2 days prior to procedure.   Deberah Pelton, NP 01/19/2023, 12:40 PM [facility]

## 2023-01-19 NOTE — Telephone Encounter (Signed)
Patient with diagnosis of aflutter on Eliquis for anticoagulation.    Procedure: colonoscopy Date of procedure: TBD  CHA2DS2-VASc Score = 4  This indicates a 4.8% annual risk of stroke. The patient's score is based upon: CHF History: 1 HTN History: 1 Diabetes History: 1 Stroke History: 0 Vascular Disease History: 0 Age Score: 1 Gender Score: 0  CrCl 73mL/min using adj body weight Platelet count 239K  Per office protocol, patient can hold Eliquis for 2 days prior to procedure.    **This guidance is not considered finalized until pre-operative APP has relayed final recommendations.**

## 2023-01-19 NOTE — Telephone Encounter (Signed)
Pt has been scheduled for a tele visit, 01/24/23 9:40.  Consent on file.  Pt will have to call back to go over medications, as he is down in his back and he just got comfortable and noone lives with him.

## 2023-01-19 NOTE — Telephone Encounter (Signed)
   Pre-operative Risk Assessment    Patient Name: Lawrence Rose  DOB: [birth year] MRN: [id number]      Request for Surgical Clearance    Procedure:   COLONOSCOPY  Date of Surgery:  Clearance TBD                                 Surgeon:  DR. Marin Comment Surgeon's Group or Practice Name:  [facility] Phone number:  [phone] Fax number:  [phone]   Type of Clearance Requested:   - Pharmacy:  Hold Apixaban (Eliquis) X'S 2 DAYS   Type of Anesthesia:  Not Indicated   Additional requests/questions:    Astrid Divine   01/19/2023, 8:54 AM

## 2023-01-24 ENCOUNTER — Ambulatory Visit: Payer: Medicare HMO | Attending: Cardiovascular Disease

## 2023-01-24 DIAGNOSIS — Z01810 Encounter for preprocedural cardiovascular examination: Secondary | ICD-10-CM | POA: Diagnosis not present

## 2023-01-24 NOTE — Progress Notes (Signed)
Virtual Visit via Telephone Note   Because of Mccray Chesler's co-morbid illnesses, he is at least at moderate risk for complications without adequate follow up.  This format is felt to be most appropriate for this patient at this time.  The patient did not have access to video technology/had technical difficulties with video requiring transitioning to audio format only (telephone).  All issues noted in this document were discussed and addressed.  No physical exam could be performed with this format.  Please refer to the patient's chart for his consent to telehealth for [facility].  Evaluation Performed:  Preoperative cardiovascular risk assessment _____________   Date:  01/24/2023   Patient ID:  Lawrence Rose, DOB 1956-04-14, MRN [id number] Patient Location:  Home Provider location:   Office  Primary Care Provider:  Randel Pigg, Dorma Russell, MD Primary Cardiologist:  Christell Constant, MD  Chief Complaint / Patient Profile   67 y.o. y/o male with a h/o atrial flutter, chronic heart failure with preserved EF, HTN, OSA on CPAP who is pending colonoscopy and presents today for telephonic preoperative cardiovascular risk assessment.  History of Present Illness    Lawrence Rose is a 67 y.o. male who presents via audio/video conferencing for a telehealth visit today.  Pt was last seen in cardiology clinic on 10/24/2022 by Dr. Izora Ribas.  At that time Lawrence Rose was doing well .  The patient is now pending procedure as outlined above. Since his last visit, he remains stable from a cardiac standpoint.  Today he denies chest pain, shortness of breath, lower extremity edema, fatigue, palpitations, melena, hematuria, hemoptysis, diaphoresis, weakness, presyncope, syncope, orthopnea, and PND.   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 Clifton Dondrell and Dr. Laneta Simmers.    Severe aortic stenosis    Sleep apnea    wears CPAP every night   Past Surgical History:  Procedure Laterality Date   BACK SURGERY     CARDIOVERSION  01/18/2021   CARDIOVERSION N/A 01/18/2021   Procedure: CARDIOVERSION;  Surgeon: Christell Constant, MD;  Location: [facility] ENDOSCOPY;  Service: Cardiovascular;  Laterality: N/A;   ESOPHAGOGASTRODUODENOSCOPY (EGD) WITH PROPOFOL N/A 10/01/2020   Procedure: ESOPHAGOGASTRODUODENOSCOPY (EGD) WITH PROPOFOL;  Surgeon: Napoleon Form, MD;  Location: [facility] ENDOSCOPY;  Service: Endoscopy;  Laterality: N/A;   RIGHT/LEFT HEART CATH AND CORONARY ANGIOGRAPHY N/A 08/18/2020   Procedure: RIGHT/LEFT HEART CATH AND CORONARY ANGIOGRAPHY;  Surgeon: Lyn Records, MD;  Location: [facility] INVASIVE CV LAB;  Service: Cardiovascular;  Laterality: N/A;   TEE WITHOUT CARDIOVERSION N/A 08/21/2020   Procedure: TRANSESOPHAGEAL ECHOCARDIOGRAM (TEE);  Surgeon: Lewayne Bunting, MD;  Location: [facility] ENDOSCOPY;  Service: Cardiovascular;  Laterality: N/A;   TEE WITHOUT CARDIOVERSION N/A 09/01/2020   Procedure: TRANSESOPHAGEAL ECHOCARDIOGRAM (TEE);  Surgeon: Kathleene Hazel, MD;  Location: [facility] INVASIVE CV LAB;  Service: Open Heart Surgery;  Laterality:  N/A;   TRANSCATHETER AORTIC VALVE REPLACEMENT, TRANSFEMORAL N/A 09/01/2020   Procedure: TRANSCATHETER AORTIC VALVE REPLACEMENT, TRANSFEMORAL;  Surgeon: Kathleene HazelMcAlhany, Christopher D, MD;  Location: [facility] INVASIVE CV LAB;  Service: Open Heart Surgery;  Laterality: N/A;    Allergies  Allergies  Allergen Reactions   Cocoa Hives   Sulfamethoxazole-Trimethoprim Anaphylaxis    Chocolate Hives   Citrus Hives    Reaction to oranges and orange juice   Fish Allergy Hives and Swelling    Throat swelling   Shellfish Allergy Hives and Swelling    Throat swelling   Sulfa Antibiotics Hives and Swelling    Throat swelling   Tetracycline Swelling    Home Medications    Prior to Admission medications   Medication Sig Start Date End Date Taking? Authorizing Provider  albuterol (VENTOLIN HFA) 108 (90 Base) MCG/ACT inhaler Inhale 2 puffs into the lungs every 4 (four) hours as needed for wheezing or shortness of breath. 02/26/21   Tomma Lightninglalere, Adewale A, MD  amiodarone (PACERONE) 200 MG tablet Take 1 tablet by mouth once daily 12/27/22   Riley Lamhandrasekhar, Mahesh A, MD  apixaban (ELIQUIS) 5 MG TABS tablet Take 1 tablet by mouth twice daily 11/22/22   Chandrasekhar, Mahesh A, MD  Blood Glucose Monitoring Suppl (FIFTY50 GLUCOSE METER 2.0) w/Device KIT 1 each by Other route in the morning, at noon, and at bedtime. 05/16/19   [provider]  Cholecalciferol 25 MCG (1000 UT) tablet Take 1,000 Units by mouth at bedtime.     [provider]  fluticasone (FLONASE) 50 MCG/ACT nasal spray Place 1 spray into both nostrils as needed for congestion. 12/14/21   [provider]  gabapentin (NEURONTIN) 300 MG capsule Take 600 mg by mouth 2 (two) times daily.  08/12/20   [provider]  HYDROcodone-acetaminophen (NORCO) 10-325 MG tablet Take 1 tablet by mouth every 6 (six) hours as needed for moderate pain.  08/04/20   [provider]  insulin lispro (HUMALOG) 100 UNIT/ML injection Inject 8 Units into the skin 3 (three) times daily with meals. 10/18/22   [provider]  Iron-Vitamins (GERITOL PO) Take 1 tablet by mouth daily.    [provider]  LANTUS 100 UNIT/ML injection Inject 28 Units into the skin at bedtime. 10/19/22   [provider]  latanoprost (XALATAN) 0.005 % ophthalmic solution Place 1 drop into both eyes at bedtime.   07/27/20   [provider]  metoprolol tartrate (LOPRESSOR) 50 MG tablet Take 1 tablet (50 mg total) by mouth 2 (two) times daily. 10/14/20   Janetta Horahompson, Kathryn R, PA-C  Multiple Vitamin (MULTIVITAMIN WITH MINERALS) TABS tablet Take 1 tablet by mouth at bedtime.     [provider]  neomycin-bacitracin-polymyxin (NEOSPORIN) [phone] ointment Apply 1 application topically 2 (two) times daily as needed (itching).    [provider]  oxyCODONE ER (XTAMPZA ER) 9 MG C12A Take 9 mg by mouth every 12 (twelve) hours.    [provider]  pantoprazole (PROTONIX) 40 MG tablet Take 40 mg by mouth 2 (two) times daily. 05/26/20   [provider]  polyethylene glycol (MIRALAX / GLYCOLAX) 17 g packet Take 17 g by mouth daily. 10/05/21   Kathlen ModyAkula, Vijaya, MD  PRECISION QID TEST test strip Use 1 strip to check blood sugar 4 times a day. Please dispense based on availability, insurance coverage, and patient preference. 10/18/22   [provider]  PRESCRIPTION MEDICATION Inhale into the lungs at bedtime. CPAP    [provider]  rosuvastatin (CRESTOR) 20 MG tablet Take 20 mg by mouth daily.  05/25/20   [provider]  senna-docusate (SENOKOT-S) 8.6-50 MG tablet Take 1 tablet by mouth daily. 10/05/21   Kathlen Mody, MD  Torsemide 40 MG TABS Take 1 tablet by mouth daily. 10/19/22   [provider]    Physical Exam    Vital Signs:  Lawrence Rose does not have vital signs available for review today.  Given telephonic nature of communication, physical exam is limited. AAOx3. NAD. Normal affect.  Speech and respirations are unlabored.  Accessory Clinical Findings    None  Assessment & Plan    1.  Preoperative Cardiovascular Risk Assessment: colonoscopy , [facility] , DR. LE       Primary Cardiologist: Christell Constant, MD  Chart reviewed as part of pre-operative protocol coverage. Given past medical history and time since  last visit, based on ACC/AHA guidelines, Kayeden Vandersluis would be at acceptable risk for the planned procedure without further cardiovascular testing.   Per office protocol, patient can hold Eliquis for 2 days prior to procedure   Patient was advised that if he develops new symptoms prior to surgery to contact our office to arrange a follow-up appointment.  He verbalized understanding.  I will route this recommendation to the requesting party via Epic fax function and remove from pre-op pool.       Time:   Today, I have spent 5 minutes with the patient with telehealth technology discussing medical history, symptoms, and management plan.  Prior to his phone evaluation I spent greater than 10 minutes reviewing his past medical history and cardiac medications.   Ronney Asters, NP  01/24/2023, 7:24 AM

## 2023-03-09 ENCOUNTER — Telehealth: Payer: Self-pay | Admitting: Internal Medicine

## 2023-03-09 DIAGNOSIS — Z7901 Long term (current) use of anticoagulants: Secondary | ICD-10-CM

## 2023-03-09 DIAGNOSIS — I4892 Unspecified atrial flutter: Secondary | ICD-10-CM

## 2023-03-09 DIAGNOSIS — Z79899 Other long term (current) drug therapy: Secondary | ICD-10-CM

## 2023-03-09 NOTE — Telephone Encounter (Signed)
Sometimes will rec generic Pradaxa since it's cheaper now (~$70/month using GoodRx), but it interacts with his amiodarone and drug interaction is worse given his CKD, so probably not a great option for him. Xarelto has program for Medicare pts in the donut hole where copay for med is $89/month, not sure what his current Eliquis copay is.

## 2023-03-09 NOTE — Telephone Encounter (Signed)
Informed patient of provider recommendations. Informed him with warfarin he will have to get labs checked. He states he has a transportation issue. He stated he think he would like to do the Watchman procedure

## 2023-03-09 NOTE — Telephone Encounter (Signed)
Patient states Eliquis is too expensive and would like another medication. He states he has already been denied for patient assistance.

## 2023-03-09 NOTE — Telephone Encounter (Signed)
Pt c/o medication issue:  1. Name of Medication:   apixaban (ELIQUIS) 5 MG TABS tablet    2. How are you currently taking this medication (dosage and times per day)? As written   3. Are you having a reaction (difficulty breathing--STAT)? No   4. What is your medication issue? Pt states he would like an alternative medication that is less expensive. Please advise.

## 2023-03-10 NOTE — Telephone Encounter (Signed)
Reviewed chart.  I saw him in 2022.  My notes indicated that he did not want to proceed with watchman at that time.  However, I do not see any contraindication.  I would be happy to see him back if he would like to revisit this as an option.

## 2023-03-10 NOTE — Telephone Encounter (Signed)
Xarelto with me program would be preferred due to Pradaxa/amio drug interaction which is worsened by his CKD.

## 2023-03-10 NOTE — Telephone Encounter (Signed)
Called pt reports is willing to try for Watchman.    Also reports pays $230 monthly for Eliquis and is willing to try a cheaper alternative like Pradaxa or Xarelto.   Advised will send message to be reviewed. Will send message to scheduling to set OV with Dr. Excell Seltzer to discuss Watchman device. Placed a referral to Dr. Excell Seltzer.

## 2023-03-14 NOTE — Telephone Encounter (Signed)
**Note De-Identified Lawrence Rose Obfuscation** The pt and I discussed him switching from Eliquis to Xarelto as there is a discount program, Xarelto with Me that offers Xarelto at a discounted price of $89/30 day supply and that all he would need to do is to contact them and enroll over the phone.   He stated that $89 is more than he can afford as well and that he is more interested in [place] device.  He states that he currently has enough Eliquis on hand and will let us know if he starts to run low before this is resolved.  He is aware that I am forwarding this note to Dr Excell Seltzer and Izora Ribas and that we will call him back with their recommendation.

## 2023-03-14 NOTE — Telephone Encounter (Signed)
Patient's current CrCl is 60 ml/min. His dose will be 20mg  once daily with food

## 2023-03-14 NOTE — Telephone Encounter (Signed)
The patient is scheduled with Dr. Excell Seltzer 7/1 to discuss Watchman. He was grateful for call and agreed with plan.

## 2023-03-21 ENCOUNTER — Telehealth: Payer: Self-pay

## 2023-03-23 ENCOUNTER — Ambulatory Visit (HOSPITAL_BASED_OUTPATIENT_CLINIC_OR_DEPARTMENT_OTHER): Payer: Medicare HMO | Attending: Pulmonary Disease | Admitting: Pulmonary Disease

## 2023-03-23 DIAGNOSIS — G4733 Obstructive sleep apnea (adult) (pediatric): Secondary | ICD-10-CM | POA: Diagnosis present

## 2023-04-04 ENCOUNTER — Telehealth: Payer: Self-pay | Admitting: Pulmonary Disease

## 2023-04-04 DIAGNOSIS — G4733 Obstructive sleep apnea (adult) (pediatric): Secondary | ICD-10-CM | POA: Diagnosis not present

## 2023-04-04 NOTE — Procedures (Signed)
POLYSOMNOGRAPHY  Last, First: Lawrence Rose, Lawrence Rose MRN: [id number] Gender: Male Age (years): 67 Weight (lbs): 330 DOB: Aug 13, 1956 BMI: 46 Primary Care: No PCP Epworth Score: 4 Referring: Tomma Lightning MD Technician: Cherylann Parr Interpreting: Tomma Lightning MD Study Type: NPSG Ordered Study Type: Split Night CPAP Study date: 03/23/2023 Location: [facility] CLINICAL INFORMATION Lawrence Rose is a 67 year old Male and was referred to the sleep center for evaluation of N/A. Indications include Diabetes, Fatigue, Hypertension, Obesity, Snoring.  MEDICATIONS Patient self administered medications include: N/A. Medications administered during study include No sleep medicine administered.  SLEEP STUDY TECHNIQUE A multi-channel overnight Polysomnography study was performed. The channels recorded and monitored were central and occipital EEG, electrooculogram (EOG), submentalis EMG (chin), nasal and oral airflow, thoracic and abdominal wall motion, anterior tibialis EMG, snore microphone, electrocardiogram, and a pulse oximetry. TECHNICIAN COMMENTS Comments added by Technician: Patient had difficulty initiating sleep. Patient was restless all through the night. O2 initiated due to low sats. Comments added by Scorer: N/A SLEEP ARCHITECTURE The study was initiated at 10:13:37 PM and terminated at 4:48:50 AM. The total recorded time was 395.2 minutes. EEG confirmed total sleep time was 376.1 minutes yielding a sleep efficiency of 95.2%. Sleep onset after lights out was 0.2 minutes with a REM latency of 113.0 minutes. The patient spent 4.3% of the night in stage N1 sleep, 87.5% in stage N2 sleep, 0.0% in stage N3 and 8.2% in REM. Wake after sleep onset (WASO) was 19.0 minutes. The Arousal Index was 4.5/hour. RESPIRATORY PARAMETERS There were a total of 71 respiratory disturbances out of which 29 were apneas ( 29 obstructive, 0 mixed, 0 central) and 42 hypopneas. The apnea/hypopnea index (AHI) was  11.3 events/hour. The central sleep apnea index was 0 events/hour. The REM AHI was 46.5 events/hour and NREM AHI was 8.2 events/hour. The supine AHI was 9.9 events/hour and the non supine AHI was 22.3 supine during 88.55% of sleep. Respiratory disturbances were associated with oxygen desaturation down to a nadir of 70.0% during sleep. The mean oxygen saturation during the study was 92.1%.  LEG MOVEMENT DATA The total leg movements were 0 with a resulting leg movement index of 0.0/hr . Associated arousal with leg movement index was 0.0/hr.  CARDIAC DATA The underlying cardiac rhythm was most consistent with sinus rhythm. Mean heart rate during sleep was 60.2 bpm.   IMPRESSIONS - Mild Obstructive Sleep apnea(OSA) - EKG showed no cardiac abnormalities. - Severe Oxygen Desaturation - The patient snored with loud snoring volume. - No significant periodic leg movements(PLMs) during sleep. However, no significant associated arousals.  DIAGNOSIS - Obstructive Sleep Apnea (G47.33) - Nocturnal Hypoxemia (G47.36)  RECOMMENDATIONS - Therapeutic CPAP titration to determine optimal pressure required to alleviate sleep disordered breathing . - With complex medical history, pulmonary hypertension, need to ascertain optimal oxygen saturations during sleep. - Avoid alcohol, sedatives and other CNS depressants that may worsen sleep apnea and disrupt normal sleep architecture. - Sleep hygiene should be reviewed to assess factors that may improve sleep quality. - Weight management and regular exercise should be initiated or continued.  [Electronically signed] 04/04/2023 05:52 AM  Virl Diamond MD NPI: [id number]

## 2023-04-04 NOTE — Telephone Encounter (Signed)
Call patient  Sleep study result  Date of study: 03/23/2023  Impression: Mild obstructive sleep apnea Severe oxygen desaturations  Recommendation: Schedule patient for an in lab titration study- sleep study was not split because of mild number of events in the early part of sleep cycle.  This is to ascertain optimal CPAP pressures and also determine if oxygen supplementation is needed despite optimal CPAP pressures.  Continue weight loss efforts  Follow-up as previously scheduled

## 2023-04-05 ENCOUNTER — Other Ambulatory Visit: Payer: Self-pay

## 2023-04-05 MED ORDER — TORSEMIDE 40 MG PO TABS: 1.0000 | ORAL_TABLET | Freq: Every day | ORAL | 3 refills | Status: DC

## 2023-04-07 NOTE — Telephone Encounter (Signed)
Pt. Calling back and he asked if we can leave detailed message on machine has a DPR on file

## 2023-04-07 NOTE — Telephone Encounter (Signed)
ATC x 1 LVM for patient to call our office back for sleep study result's.

## 2023-04-12 ENCOUNTER — Encounter: Payer: Self-pay | Admitting: *Deleted

## 2023-04-12 NOTE — Telephone Encounter (Signed)
ATC patient with sleep study results.  Left detailed message with results on VM (DPR).  I have also sent results to patient through my chart. Call back number given to patient to return call.

## 2023-04-12 NOTE — Telephone Encounter (Signed)
Called and spoke with patient.  Dr. Trena Platt results and recommendations given. Understanding stated. Cpap titration ordered.  Nothing further at this time.

## 2023-04-12 NOTE — Telephone Encounter (Signed)
Pt calling back for results

## 2023-04-17 ENCOUNTER — Ambulatory Visit: Payer: Medicare HMO | Attending: Cardiovascular Disease | Admitting: Cardiovascular Disease

## 2023-04-17 ENCOUNTER — Encounter: Payer: Self-pay | Admitting: Cardiovascular Disease

## 2023-04-17 VITALS — BP 116/72 | HR 68 | Ht 71.0 in | Wt 333.0 lb

## 2023-04-17 DIAGNOSIS — E1159 Type 2 diabetes mellitus with other circulatory complications: Secondary | ICD-10-CM

## 2023-04-17 DIAGNOSIS — I152 Hypertension secondary to endocrine disorders: Secondary | ICD-10-CM | POA: Diagnosis not present

## 2023-04-17 DIAGNOSIS — Z794 Long term (current) use of insulin: Secondary | ICD-10-CM | POA: Diagnosis not present

## 2023-04-17 NOTE — Progress Notes (Signed)
Watchman Consult Note   Date:  04/17/2023   ID:  Lawrence Rose, DOB Jun 05, 1956, MRN [id number]  PCP:  Randel Pigg, Dorma Russell, MD  Cardiologist:  Dr Izora Ribas Primary Electrophysiologist: none Referring Physician: Dr Izora Ribas   CC: to discuss Watchman implant    History of Present Illness: Lawrence Rose is a 67 y.o. male referred by Dr Izora Ribas for evaluation of atrial fibrillation and stroke prevention. He has paroxysmal atrial fibrillation.  The patient has been evaluated by their referring physician and is felt to be a poor candidate for long term OAC due to cost constraints and hx of GI bleeding.  He therefore presents today for Watchman evaluation.    Today, he denies symptoms of palpitations, chest pain, shortness of breath, orthopnea, PND, claudication, dizziness, presyncope, syncope, bleeding, or neurologic sequela.  The patient has chronic leg edema.  He is in a wheelchair today and he uses this whenever he goes to the doctor.  At home he uses a walker to get around.  His last bleeding complication was a few years ago.  More recently, cost of oral anticoagulant drugs has been a bigger issue for him.  When he gets in the donut hole his Eliquis is cost of about $100 per month.   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 Clifton Eithan and Dr. Laneta Simmers.    Severe aortic stenosis    Sleep apnea    wears CPAP every night   Past Surgical History:  Procedure Laterality Date   BACK SURGERY     CARDIOVERSION  01/18/2021    CARDIOVERSION N/A 01/18/2021   Procedure: CARDIOVERSION;  Surgeon: Christell Constant, MD;  Location: [facility] ENDOSCOPY;  Service: Cardiovascular;  Laterality: N/A;   ESOPHAGOGASTRODUODENOSCOPY (EGD) WITH PROPOFOL N/A 10/01/2020   Procedure: ESOPHAGOGASTRODUODENOSCOPY (EGD) WITH PROPOFOL;  Surgeon: Napoleon Form, MD;  Location: [facility] ENDOSCOPY;  Service: Endoscopy;  Laterality: N/A;   RIGHT/LEFT HEART CATH AND CORONARY ANGIOGRAPHY N/A 08/18/2020   Procedure: RIGHT/LEFT HEART CATH AND CORONARY ANGIOGRAPHY;  Surgeon: Lyn Records, MD;  Location: [facility] INVASIVE CV LAB;  Service: Cardiovascular;  Laterality: N/A;   TEE WITHOUT CARDIOVERSION N/A 08/21/2020   Procedure: TRANSESOPHAGEAL ECHOCARDIOGRAM (TEE);  Surgeon: Lewayne Bunting, MD;  Location: [facility] ENDOSCOPY;  Service: Cardiovascular;  Laterality: N/A;   TEE WITHOUT CARDIOVERSION N/A 09/01/2020   Procedure: TRANSESOPHAGEAL ECHOCARDIOGRAM (TEE);  Surgeon: Kathleene Hazel, MD;  Location: [facility] INVASIVE CV LAB;  Service: Open Heart Surgery;  Laterality: N/A;   TRANSCATHETER AORTIC VALVE REPLACEMENT, TRANSFEMORAL N/A 09/01/2020   Procedure: TRANSCATHETER AORTIC VALVE REPLACEMENT, TRANSFEMORAL;  Surgeon: Kathleene Hazel, MD;  Location: [facility] INVASIVE CV LAB;  Service: Open Heart Surgery;  Laterality: N/A;     Current Outpatient Medications  Medication Sig Dispense Refill   albuterol (VENTOLIN HFA) 108 (90 Base) MCG/ACT inhaler Inhale 2 puffs into the lungs every 4 (four) hours as needed for wheezing  or shortness of breath. 8 g 3   amiodarone (PACERONE) 200 MG tablet Take 1 tablet by mouth once daily 90 tablet 3   apixaban (ELIQUIS) 5 MG TABS tablet Take 1 tablet by mouth twice daily 180 tablet 1   B-D UF III MINI PEN NEEDLES 31G X 5 MM MISC Inject into the skin daily.     Blood Glucose Monitoring Suppl (FIFTY50 GLUCOSE METER 2.0) w/Device KIT 1 each by Other route in the morning, at noon, and at bedtime.     Cholecalciferol 25 MCG (1000 UT)  tablet Take 1,000 Units by mouth at bedtime.      fluticasone (FLONASE) 50 MCG/ACT nasal spray Place 1 spray into both nostrils as needed for congestion.     gabapentin (NEURONTIN) 300 MG capsule Take 600 mg by mouth 2 (two) times daily.      glucose blood test strip TEST BLOOD SUGAR EVERY DAY     HYDROcodone-acetaminophen (NORCO) 10-325 MG tablet Take 1 tablet by mouth every 6 (six) hours as needed for moderate pain.      Iron-Vitamins (GERITOL PO) Take 1 tablet by mouth daily.     LANTUS 100 UNIT/ML injection Inject 28 Units into the skin at bedtime.     latanoprost (XALATAN) 0.005 % ophthalmic solution Place 1 drop into both eyes at bedtime.      metoprolol tartrate (LOPRESSOR) 50 MG tablet Take 1 tablet (50 mg total) by mouth 2 (two) times daily. 180 tablet 3   Multiple Vitamin (MULTIVITAMIN WITH MINERALS) TABS tablet Take 1 tablet by mouth at bedtime.      pantoprazole (PROTONIX) 40 MG tablet Take 40 mg by mouth 2 (two) times daily.     polyethylene glycol (MIRALAX / GLYCOLAX) 17 g packet Take 17 g by mouth daily. 14 each 0   PRECISION QID TEST test strip Use 1 strip to check blood sugar 4 times a day. Please dispense based on availability, insurance coverage, and patient preference.     PRESCRIPTION MEDICATION Inhale into the lungs at bedtime. CPAP     rosuvastatin (CRESTOR) 20 MG tablet Take 20 mg by mouth daily.      RYBELSUS 3 MG TABS Take 1 tablet by mouth every morning.     senna-docusate (SENOKOT-S) 8.6-50 MG tablet Take 1 tablet by mouth daily.     Torsemide 40 MG TABS Take 1 tablet by mouth daily. 90 tablet 3   TRUEplus Lancets 33G MISC TEST BLOOD SUGAR EVERY DAY     No current facility-administered medications for this visit.    Allergies:   Cocoa, Sulfamethoxazole-trimethoprim, Chocolate, Citrus, Fish allergy, Shellfish allergy, Sulfa antibiotics, and Tetracycline   Social History:  The patient  reports that he quit smoking about 34 years ago. His smoking use included  cigarettes. He has a 15.00 pack-year smoking history. He has never used smokeless tobacco. He reports that he does not currently use alcohol. He reports that he does not currently use drugs.   Family History:  The patient's family history is not on file.    ROS:  Please see the history of present illness.   All other systems are reviewed and negative.    PHYSICAL EXAM: VS:  BP 116/72   Pulse 68   Ht 5\' 11"  (1.803 m)   Wt (!) 333 lb (151 kg)   SpO2 92%   BMI 46.44 kg/m  , BMI Body mass index is 46.44 kg/m. GEN: Pleasant, morbidly obese male in a wheelchair, in no acute  distress  HEENT: normal  Neck: no JVD, carotid bruits, or masses Cardiac: RRR; 2/6 systolic murmur at the right upper sternal border Respiratory:  clear to auscultation bilaterally, normal work of breathing GI: soft, nontender, nondistended, + BS MS: no deformity or atrophy, 2+ bilateral ankle edema Skin: warm and dry  Neuro:  Strength and sensation are intact Psych: euthymic mood, full affect   Recent Labs: 10/24/2022: ALT 17; BUN 46; Creatinine, Ser 2.45; NT-Pro BNP 219; Potassium 4.1; Sodium 138; TSH 0.900    Lipid Panel     Component Value Date/Time   CHOL 127 08/17/2020 0351   TRIG 69 08/17/2020 0351   HDL 39 (L) 08/17/2020 0351   CHOLHDL 3.3 08/17/2020 0351   VLDL 14 08/17/2020 0351   LDLCALC 74 08/17/2020 0351     Wt Readings from Last 3 Encounters:  04/17/23 (!) 333 lb (151 kg)  03/23/23 (!) 330 lb (149.7 kg)  01/04/23 (!) 305 lb (138.3 kg)      Other studies Reviewed: Additional studies/ records that were reviewed today include:  Cardiac Studies & Procedures   CARDIAC CATHETERIZATION  CARDIAC CATHETERIZATION 08/18/2020  Narrative  Widely patent coronary arteries  High cardiac output: 9.06 L/min by Fick and 9.23 L/min by thermal dilution.  Etiology uncertain.  Suppressed TSH with normal T4 raising question of T3 thyrotoxicosis versus other mechanism.  Severe pulmonary hypertension,  mean pressure 48 mmHg, based upon hemodynamics WHO group 2  Pulmonary capillary wedge pressure mean 34 mmHg, pulmonary vascular resistance 1.55 Woods units.  Mean right atrial pressure 19 mmHg  Aortic valve gradient peak to peak 33 mmHg with mean gradient 22 mmHg.  Aortic valve area 1.94 cm by Fick cardiac output and 1.98 cm by thermodilution  LV function normal by echocardiography with moderate LVH  RECOMMENDATIONS:   Further work-up/management per treating team.  Needs further diuresis.  T3 level  Findings Coronary Findings Diagnostic  Dominance: Right  Left Anterior Descending The vessel exhibits minimal luminal irregularities.  Left Circumflex Mid Cx to Dist Cx lesion is 25% stenosed.  Right Coronary Artery The vessel exhibits minimal luminal irregularities. Prox RCA lesion is 30% stenosed.  Intervention  No interventions have been documented.     ECHOCARDIOGRAM  ECHOCARDIOGRAM COMPLETE 10/26/2022  Narrative ECHOCARDIOGRAM REPORT    [name] Date of Exam: 10/26/2022 Medical Rec #:  [id number]      Height:       70.0 in Accession #:    [id number]     Weight:       305.8 lb Date of Birth:  25-Jul-1956      BSA:          2.500 m Patient Age:    66 years       BP:           102/56 mmHg Patient Gender: M              HR:           65 bpm. Exam Location:  [address]  Procedure: 2D Echo, 3D Echo, Cardiac Doppler, Color Doppler and Strain Analysis  Indications:    I35.0 Aortic Stenosis  History:        Patient has prior history of Echocardiogram examinations, most recent 09/27/2021. Aortic Valve Disease, Arrythmias:Atrial Flutter; Risk Factors:Hypertension, Diabetes, Dyslipidemia, Former Smoker and Sleep Apnea. Aortic Stenosis status post TAVR (09/01/20, 29mm Medtronic Evolut Pro+ SupraAnnular Valve), Morbid Obesity, CKD. Aortic Valve: 29 mm Medtronic Evolut Pro+ SupraAnnular  valve valve is present in the aortic position.  Procedure Date: 09/01/2020.  Sonographer:    Farrel Conners RDCS Referring Phys: [facility] CHANDRASEKHAR  IMPRESSIONS   1. Left ventricular ejection fraction, by estimation, is 60 to 65%. Left ventricular ejection fraction by 3D volume is 61 %. The left ventricle has normal function. The left ventricle has no regional wall motion abnormalities. Left ventricular diastolic parameters are consistent with Grade I diastolic dysfunction (impaired relaxation). The average left ventricular global longitudinal strain is -20.9 %. The global longitudinal strain is normal. 2. Right ventricular systolic function is normal. The right ventricular size is normal. There is normal pulmonary artery systolic pressure. The estimated right ventricular systolic pressure is 32.8 mmHg. 3. Left atrial size was mildly dilated. 4. Right atrial size was mildly dilated. 5. The mitral valve is abnormal. Trivial mitral valve regurgitation. 6. The aortic valve has been repaired/replaced. Aortic valve regurgitation is not visualized. There is a 29 mm Medtronic Evolut Pro+ SupraAnnular valve valve present in the aortic position. Procedure Date: 09/01/2020. Echo findings are consistent with normal structure and function of the aortic valve prosthesis. Aortic valve area, by VTI measures 1.54 cm. Aortic valve mean gradient measures 17.2 mmHg. Aortic valve Vmax measures 2.84 m/s. Peak gradient 32.4 mmHg, DI 0.40 7. The inferior vena cava is normal in size with greater than 50% respiratory variability, suggesting right atrial pressure of 3 mmHg.  Comparison(s): Changes from prior study are noted. 09/27/2021: LVEF 60-65%, trivial PVL, TAVR mean gradient 15 mmHg.  FINDINGS Left Ventricle: Left ventricular ejection fraction, by estimation, is 60 to 65%. Left ventricular ejection fraction by 3D volume is 61 %. The left ventricle has normal function. The left ventricle has no regional wall motion abnormalities. The average left  ventricular global longitudinal strain is -20.9 %. The global longitudinal strain is normal. The left ventricular internal cavity size was normal in size. There is no left ventricular hypertrophy. Left ventricular diastolic parameters are consistent with Grade I diastolic dysfunction (impaired relaxation). Indeterminate filling pressures.  Right Ventricle: The right ventricular size is normal. No increase in right ventricular wall thickness. Right ventricular systolic function is normal. There is normal pulmonary artery systolic pressure. The tricuspid regurgitant velocity is 2.73 m/s, and with an assumed right atrial pressure of 3 mmHg, the estimated right ventricular systolic pressure is 32.8 mmHg.  Left Atrium: Left atrial size was mildly dilated.  Right Atrium: Right atrial size was mildly dilated.  Pericardium: There is no evidence of pericardial effusion.  Mitral Valve: The mitral valve is abnormal. Mild to moderate mitral annular calcification. Trivial mitral valve regurgitation.  Tricuspid Valve: The tricuspid valve is grossly normal. Tricuspid valve regurgitation is trivial.  Aortic Valve: The aortic valve has been repaired/replaced. Aortic valve regurgitation is not visualized. Aortic valve mean gradient measures 17.2 mmHg. Aortic valve peak gradient measures 32.4 mmHg. Aortic valve area, by VTI measures 1.54 cm. There is a 29 mm Medtronic Evolut Pro+ SupraAnnular valve valve present in the aortic position. Procedure Date: 09/01/2020. Echo findings are consistent with normal structure and function of the aortic valve prosthesis.  Pulmonic Valve: The pulmonic valve was normal in structure. Pulmonic valve regurgitation is not visualized.  Aorta: The aortic root and ascending aorta are structurally normal, with no evidence of dilitation.  Venous: The inferior vena cava is normal in size with greater than 50% respiratory variability, suggesting right atrial pressure of 3  mmHg.  IAS/Shunts: No atrial level shunt detected by color flow Doppler.  LEFT VENTRICLE PLAX 2D LVIDd:         5.10 cm         Diastology LVIDs:         2.80 cm         LV e' medial:    7.18 cm/s LV PW:         0.90 cm         LV E/e' medial:  18.5 LV IVS:        0.90 cm         LV e' lateral:   7.62 cm/s LVOT diam:     2.20 cm         LV E/e' lateral: 17.5 LV SV:         98 LV SV Index:   39              2D LVOT Area:     3.80 cm        Longitudinal Strain 2D Strain GLS  -19.8 % (A2C): 2D Strain GLS  -16.5 % (A3C): 2D Strain GLS  -26.3 % (A4C): 2D Strain GLS  -20.9 % Avg:  3D Volume EF LV 3D EF:    Left ventricul ar ejection fraction by 3D volume is 61 %.  3D Volume EF: 3D EF:        61 % LV EDV:       155 ml LV ESV:       60 ml LV SV:        95 ml  RIGHT VENTRICLE RV Basal diam:  4.20 cm RV Mid diam:    3.40 cm RV S prime:     16.75 cm/s TAPSE (M-mode): 3.4 cm  LEFT ATRIUM              Index        RIGHT ATRIUM           Index LA diam:        3.60 cm  1.44 cm/m   RA Area:     24.00 cm LA Vol (A2C):   104.0 ml 41.60 ml/m  RA Volume:   74.20 ml  29.68 ml/m LA Vol (A4C):   80.9 ml  32.36 ml/m LA Biplane Vol: 93.7 ml  37.48 ml/m AORTIC VALVE AV Area (Vmax):    1.68 cm AV Area (Vmean):   1.59 cm AV Area (VTI):     1.54 cm AV Vmax:           284.50 cm/s AV Vmean:          190.500 cm/s AV VTI:            0.636 m AV Peak Grad:      32.4 mmHg AV Mean Grad:      17.2 mmHg LVOT Vmax:         126.00 cm/s LVOT Vmean:        79.700 cm/s LVOT VTI:          0.257 m LVOT/AV VTI ratio: 0.40  AORTA Ao Root diam: 3.00 cm Ao Asc diam:  3.10 cm  MITRAL VALVE                TRICUSPID VALVE MV Area (PHT): cm          TR Peak grad:   29.8 mmHg MV Decel Time: 256 msec     TR Vmax:        273.00 cm/s MV E velocity: 133.00 cm/s MV  A velocity: 133.00 cm/s  SHUNTS MV E/A ratio:  1.00         Systemic VTI:  0.26 m Systemic Diam: 2.20 cm  Zoila Shutter  MD Electronically signed by Zoila Shutter MD Signature Date/Time: 10/26/2022/1:57:54 PM    Final   TEE  ECHO TEE 08/21/2020  Narrative TRANSESOPHOGEAL ECHO REPORT    [name] Date of Exam: 08/21/2020 Medical Rec #:  [id number]      Height:       71.0 in Accession #:    [id number]     Weight:       303.2 lb Date of Birth:  1955/12/09      BSA:          2.516 m Patient Age:    64 years       BP:           149/75 mmHg Patient Gender: M              HR:           87 bpm. Exam Location:  Inpatient  Procedure: Transesophageal Echo, Cardiac Doppler and Color Doppler  Indications:     I35.0 Nonrheumatic aortic (valve) stenosis  History:         Patient has prior history of Echocardiogram examinations, most recent 08/17/2020. Previous Myocardial Infarction and CAD, Aortic Valve Disease; Risk Factors:Sleep Apnea. Severe aortic stenosis.  Sonographer:     Sheralyn Boatman RDCS Referring Phys:  [id number] Wille Celeste THOMPSON Diagnosing Phys: Olga Millers MD  PROCEDURE: After discussion of the risks and benefits of a TEE, an informed consent was obtained from the patient. The transesophogeal probe was passed without difficulty through the esophogus of the patient. Imaged were obtained with the patient in a left lateral decubitus position. Sedation performed by different physician. The patient was monitored while under deep sedation. Anesthestetic sedation was provided intravenously by Anesthesiology: 300mg  of Propofol. The patient's vital signs; including heart rate, blood pressure, and oxygen saturation; remained stable throughout the procedure. The patient developed no complications during the procedure.  IMPRESSIONS   1. Severe AS (mean gradient 42 mmHg and peak velocity of 4.2 m/s); mild AI. 2. Left ventricular ejection fraction, by estimation, is 70 to 75%. The left ventricle has hyperdynamic function. There is severe left ventricular hypertrophy. 3. Right ventricular  systolic function is normal. The right ventricular size is normal. 4. Left atrial size was moderately dilated. No left atrial/left atrial appendage thrombus was detected. 5. The mitral valve is normal in structure. Trivial mitral valve regurgitation. 6. The aortic valve is tricuspid. Aortic valve regurgitation is mild. Severe aortic valve stenosis. 7. There is Moderate (Grade III) plaque involving the descending aorta.  FINDINGS Left Ventricle: Left ventricular ejection fraction, by estimation, is 70 to 75%. The left ventricle has hyperdynamic function. The left ventricular internal cavity size was normal in size. There is severe left ventricular hypertrophy.  Right Ventricle: The right ventricular size is normal. Right vetricular wall thickness was not assessed. Right ventricular systolic function is normal.  Left Atrium: Left atrial size was moderately dilated. No left atrial/left atrial appendage thrombus was detected.  Right Atrium: Right atrial size was normal in size.  Pericardium: There is no evidence of pericardial effusion.  Mitral Valve: The mitral valve is normal in structure. Mild mitral annular calcification. Trivial mitral valve regurgitation. MV peak gradient, 5.8 mmHg. The mean mitral valve gradient is 2.0 mmHg.  Tricuspid Valve: The tricuspid valve is  normal in structure. Tricuspid valve regurgitation is mild.  Aortic Valve: The aortic valve is tricuspid. Aortic valve regurgitation is mild. Severe aortic stenosis is present. Aortic valve mean gradient measures 41.5 mmHg. Aortic valve peak gradient measures 66.7 mmHg. Aortic valve area, by VTI measures 1.04 cm.  Pulmonic Valve: The pulmonic valve was normal in structure. Pulmonic valve regurgitation is not visualized.  Aorta: The aortic root is normal in size and structure. There is moderate (Grade III) plaque involving the descending aorta.  IAS/Shunts: No atrial level shunt detected by color flow Doppler.  Additional  Comments: Severe AS (mean gradient 42 mmHg and peak velocity of 4.2 m/s); mild AI.   LEFT VENTRICLE PLAX 2D LVOT diam:     2.20 cm LV SV:         81 LV SV Index:   32 LVOT Area:     3.80 cm   AORTIC VALVE AV Area (Vmax):    1.01 cm AV Area (Vmean):   0.92 cm AV Area (VTI):     1.04 cm AV Vmax:           408.50 cm/s AV Vmean:          307.500 cm/s AV VTI:            0.778 m AV Peak Grad:      66.7 mmHg AV Mean Grad:      41.5 mmHg LVOT Vmax:         108.00 cm/s LVOT Vmean:        74.800 cm/s LVOT VTI:          0.214 m LVOT/AV VTI ratio: 0.27  MITRAL VALVE MV Peak grad: 5.8 mmHg  SHUNTS MV Mean grad: 2.0 mmHg  Systemic VTI:  0.21 m MV Vmax:      1.20 m/s  Systemic Diam: 2.20 cm MV Vmean:     60.7 cm/s  Olga Millers MD Electronically signed by Olga Millers MD Signature Date/Time: 08/21/2020/9:21:38 AM    Final    CT SCANS  CT CORONARY MORPH W/CTA COR W/SCORE 08/20/2020  Addendum 08/20/2020  8:04 AM ADDENDUM REPORT: 08/20/2020 08:01  EXAM: OVER-READ INTERPRETATION  CT CHEST  The following report is an over-read performed by radiologist Dr. Royal Piedra [facility] on 08/20/2020. This over-read does not include interpretation of cardiac or coronary anatomy or pathology. The coronary calcium score and cardiac CTA interpretation by the cardiologist is attached.  COMPARISON:  Chest CTA 08/15/2020.  FINDINGS: Extracardiac findings will be described separately under dictation for contemporaneously obtained CTA chest, abdomen and pelvis.  IMPRESSION: Please see separate dictation for contemporaneously obtained CTA chest, abdomen and pelvis 08/19/2020 for full description of relevant extracardiac findings.   Electronically Signed By: Trudie Reed M.D. On: 08/20/2020 08:01  Narrative MEDICATIONS: MEDICATIONS None  EXAM: Cardiac TAVR CT  TECHNIQUE: The patient was scanned on a Siemens Force 192 slice scanner. A 120 kV  retrospective scan was triggered in the descending thoracic aorta at 111 HU's. Gantry rotation speed was 270 msecs and collimation was .9 mm. No beta blockade or nitro were given. The 3D data set was reconstructed in 5% intervals of the R-R cycle. Systolic and diastolic phases were analyzed on a dedicated work station using MPR, MIP and VRT modes. The patient received 80 cc of contrast.  FINDINGS: Aortic Valve: Calcium score 1645 Tri leaflet with restricted motion  Aorta: No aneurysm moderate calcific atherosclerosis normal arch vessels  Sino-tubular Junction: 25 mm  Ascending Thoracic  Aorta: 33 mm  Aortic Arch: 25 mm  Descending Thoracic Aorta: 25 mm  Sinus of Valsalva Measurements:  Non-coronary: 30.8 mm  Right - coronary: 28.2 mm  Left - coronary: 31.3 mm  Coronary Artery Height above Annulus:  Left Main: 11.1 mm above annulus  Right Coronary: 15.3 mm above annulus  Virtual Basal Annulus Measurements:  Maximum/Minimum Diameter: 27.4 mm x 22.8 mm  Perimeter: 82 mm  Area: 505 mm2  Coronary Arteries: Sufficient height above annulus for deployment  Optimum Fluoroscopic Angle for Delivery: LAO 3 Caudal 6 degrees  IMPRESSION: 1. Tri-leaflet AV with calcium score 1645 and annular area of 505 mm2 suitable for a 26 mm Sapien 3 valve  2.  Coronary arteries sufficient height above annulus for deployment  3.  Optimum angiographic angle for deployment LAO 3 Caudal 6 degrees  4. Some nodular calcification in annulus at base of left coronary cusp  5.  Normal aortic root 3.3 cm  Lawrence Rose  Electronically Signed: By: Lawrence Rose M.D. On: 08/19/2020 18:00              ASSESSMENT AND PLAN:  1.  Paroxysmal atrial fibrillation I have seen Lawrence Rose is a 67 y.o. male in the office today who has been referred for a Watchman left atrial appendage closure device.  He has a history of paroxysmal atrial fibrillation.  The patients atrial  fibrillation-related risk scores are outlined below:   CHA2DS2-VASc Score = 4   This indicates a 4.8% annual risk of stroke. The patient's score is based upon: CHF History: 1 HTN History: 1 Diabetes History: 1 Stroke History: 0 Vascular Disease History: 0 Age Score: 1 Gender Score: 0      HAS-BLED score = 3 Hypertension No  Abnormal renal and liver function (Dialysis, transplant, Cr >2.26 mg/dL /Cirrhosis or Bilirubin >2x Normal or AST/ALT/AP >3x Normal) Yes  Stroke No  Bleeding Yes  Labile INR (Unstable/high INR) No  Elderly (>65) Yes  Drugs or alcohol (? 8 drinks/week, anti-plt or NSAID) No   The patient returns today for further discussion about left atrial appendage occlusion.  He was initially seen in [place] consultation in 2022.  At that time he was not interested in proceeding.  He now has run into issues with cost of oral anticoagulant drugs.  I reviewed the Watchman procedure animation with him today.  We discussed risks, indications, and alternatives.  I carefully reviewed his medical history as he has significant comorbid conditions.  Considering his morbid obesity with BMI greater than 40 and chronic kidney disease with creatinine of 2.5 amongst other comorbidities, I do not think he is a good candidate for left atrial appendage occlusion.  He will continue with medical therapy under the care of his primary care physician and cardiologist.  All of his questions were answered today.  He is in agreement that medical therapy is most appropriate at this time prefers to avoid the procedure if possible.  Labs/ tests ordered today include:  Orders Placed This Encounter  Procedures   EKG 12-Lead     Signed, Tonny Bollman, MD 04/17/2023  11:32 AM     [facility] [address] [place] [address] [phone] (office) [phone] (fax)

## 2023-04-17 NOTE — Patient Instructions (Signed)
Medication Instructions:  Your physician recommends that you continue on your current medications as directed. Please refer to the Current Medication list given to you today.   *If you need a refill on your cardiac medications before your next appointment, please call your pharmacy*  Follow-Up: At [facility], you and your health needs are our priority.  As part of our continuing mission to provide you with exceptional heart care, we have created designated Provider Care Teams.  These Care Teams include your primary Cardiologist (physician) and Advanced Practice Providers (APPs -  Physician Assistants and Nurse Practitioners) who all work together to provide you with the care you need, when you need it.  Your next appointment:   As needed 

## 2023-05-15 ENCOUNTER — Ambulatory Visit (HOSPITAL_BASED_OUTPATIENT_CLINIC_OR_DEPARTMENT_OTHER): Payer: Medicare HMO | Attending: Pulmonary Disease | Admitting: Pulmonary Disease

## 2023-05-15 ENCOUNTER — Other Ambulatory Visit: Payer: Self-pay | Admitting: Internal Medicine

## 2023-05-15 DIAGNOSIS — E119 Type 2 diabetes mellitus without complications: Secondary | ICD-10-CM | POA: Diagnosis not present

## 2023-05-15 DIAGNOSIS — I4892 Unspecified atrial flutter: Secondary | ICD-10-CM

## 2023-05-15 DIAGNOSIS — G4733 Obstructive sleep apnea (adult) (pediatric): Secondary | ICD-10-CM | POA: Insufficient documentation

## 2023-05-15 DIAGNOSIS — E669 Obesity, unspecified: Secondary | ICD-10-CM | POA: Diagnosis not present

## 2023-05-15 DIAGNOSIS — I10 Essential (primary) hypertension: Secondary | ICD-10-CM | POA: Diagnosis not present

## 2023-05-15 NOTE — Telephone Encounter (Signed)
Prescription refill request for Eliquis received. Indication:AFLUTTER Last office visit:7/24 Scr:2.22  6/24 Age: 67 Weight:151  kg  Prescription refilled

## 2023-05-17 ENCOUNTER — Telehealth: Payer: Self-pay | Admitting: Internal Medicine

## 2023-05-17 DIAGNOSIS — I4892 Unspecified atrial flutter: Secondary | ICD-10-CM

## 2023-05-17 NOTE — Telephone Encounter (Signed)
**Note De-Identified Burgandy Hackworth Obfuscation** Per the pts chart, he has applied for Eliquis assistance in the past. I called him back and he stated that he is interested in applying again as the cost of Eliquis is more than he can afford.  I offered to leave him a BMSPAF application in the front office for him to pick up but he stated that he does not drive so I offered to mail him an application and he requested that I do.  He is aware that once he receives the application to complete his part of it, obtain required documents per BMSPAF, and to then bring all back to the office to drop off and we will complete the providers page of his application and we will fax all to BMSPAF.  He stated that he currently has a couple weeks of Eliquis on hand and he is aware that we will provide him with Eliquis samples when he return his application to [place] to save him a trip.  The pt is in agreement with this plan and he thanked me for my call back.

## 2023-05-17 NOTE — Telephone Encounter (Signed)
Pt c/o medication issue:  1. Name of Medication:   apixaban (ELIQUIS) 5 MG TABS tablet    2. How are you currently taking this medication (dosage and times per day)? Take 1 tablet by mouth twice daily   3. Are you having a reaction (difficulty breathing--STAT)? No  4. What is your medication issue? Pt states he is having trouble picking up the medication because the cost has increased and he would like to speak with someone about this. Please advise.

## 2023-05-18 NOTE — Telephone Encounter (Signed)
**Note De-Identified Griffith Santilli Obfuscation** I placed the BMSPAF application in our out going mail bin addressed to the pt.

## 2023-05-22 ENCOUNTER — Telehealth: Payer: Self-pay | Admitting: *Deleted

## 2023-05-22 NOTE — Telephone Encounter (Signed)
   Pre-operative Risk Assessment    Patient Name: Lawrence Rose  DOB: 1956-01-11 MRN: [id number]      Request for Surgical Clearance    Procedure:  Dental Extraction - Amount of Teeth to be Pulled:  4  Date of Surgery:  Clearance TBD                                 Surgeon:   Surgeon's Group or Practice Name:  [facility]  Phone number:  [phone] Fax number:  [phone]   Type of Clearance Requested:   - Medical  - Pharmacy:  Hold Apixaban (Eliquis) NOT INDICATED HOW LONG   Type of Anesthesia:  Local    Additional requests/questions:    [name]   05/22/2023, 2:43 PM

## 2023-05-23 ENCOUNTER — Telehealth: Payer: Self-pay

## 2023-05-23 MED ORDER — AMOXICILLIN 500 MG PO CAPS: ORAL_CAPSULE | ORAL | 3 refills | Status: AC

## 2023-05-23 NOTE — Telephone Encounter (Signed)
Patient with diagnosis of aflutter on Eliquis for anticoagulation.    Procedure: 4 dental extractions Date of procedure: TBD  CHA2DS2-VASc Score = 4  This indicates a 4.8% annual risk of stroke. The patient's score is based upon: CHF History: 1 HTN History: 1 Diabetes History: 1 Stroke History: 0 Vascular Disease History: 0 Age Score: 1 Gender Score: 0   CrCl 15mL/min using adjusted body weight Platelet count 239K  Patient does require pre-op antibiotics for dental procedure due to history of TAVR. Reviewed med allergies, he has multiple allergies to antibiotics listed, no penicillin allergy noted. Please let pt know that I have sent in rx for amoxicillin to his pharmacy.  Per office protocol, patient can hold Eliquis for 1 day prior to procedure.    **This guidance is not considered finalized until pre-operative APP has relayed final recommendations.**

## 2023-05-23 NOTE — Telephone Encounter (Signed)
Please advise holding Eliquis prior to extraction of 4 teeth.   Thank you!  DW

## 2023-05-23 NOTE — Telephone Encounter (Signed)
Pt is scheduled for tele preop on 08/20 at 9:40am. Med rec and consent done

## 2023-05-23 NOTE — Telephone Encounter (Signed)
   Name: Lawrence Rose  DOB: 1956-08-16  MRN: [id number]  Primary Cardiologist: Christell Constant, MD   Preoperative team, please contact this patient and set up a phone call appointment for further preoperative risk assessment. Please obtain consent and complete medication review. Thank you for your help.  I confirm that guidance regarding antiplatelet and oral anticoagulation therapy has been completed and, if necessary, noted below.  Per Pharm D: Patient does require pre-op antibiotics for dental procedure due to history of TAVR. Reviewed med allergies, he has multiple allergies to antibiotics listed, no penicillin allergy noted. Please let pt know that I have sent in rx for amoxicillin to his pharmacy.   Per office protocol, patient can hold Eliquis for 1 day prior to procedure.     Carlos Levering, NP 05/23/2023, 3:33 PM [facility]

## 2023-05-23 NOTE — Addendum Note (Signed)
Addended by: ,  E on: 05/23/2023 03:29 PM   Modules accepted: Orders

## 2023-05-23 NOTE — Telephone Encounter (Signed)
Pt is scheduled for tele preop on 08/20 at 9:40am. Med rec and consent done    Patient Consent for Virtual Visit        [name] has provided verbal consent on 05/23/2023 for a virtual visit (video or telephone).   CONSENT FOR VIRTUAL VISIT FOR:  Lawrence Rose  By participating in this virtual visit I agree to the following:  I hereby voluntarily request, consent and authorize [facility] and its employed or contracted physicians, physician assistants, nurse practitioners or other licensed health care professionals (the Practitioner), to provide me with telemedicine health care services (the "Services") as deemed necessary by the treating Practitioner. I acknowledge and consent to receive the Services by the Practitioner via telemedicine. I understand that the telemedicine visit will involve communicating with the Practitioner through live audiovisual communication technology and the disclosure of certain medical information by electronic transmission. I acknowledge that I have been given the opportunity to request an in-person assessment or other available alternative prior to the telemedicine visit and am voluntarily participating in the telemedicine visit.  I understand that I have the right to withhold or withdraw my consent to the use of telemedicine in the course of my care at any time, without affecting my right to future care or treatment, and that the Practitioner or I may terminate the telemedicine visit at any time. I understand that I have the right to inspect all information obtained and/or recorded in the course of the telemedicine visit and may receive copies of available information for a reasonable fee.  I understand that some of the potential risks of receiving the Services via telemedicine include:  Delay or interruption in medical evaluation due to technological equipment failure or disruption; Information transmitted may not be sufficient (e.g. poor resolution of  images) to allow for appropriate medical decision making by the Practitioner; and/or  In rare instances, security protocols could fail, causing a breach of personal health information.  Furthermore, I acknowledge that it is my responsibility to provide information about my medical history, conditions and care that is complete and accurate to the best of my ability. I acknowledge that Practitioner's advice, recommendations, and/or decision may be based on factors not within their control, such as incomplete or inaccurate data provided by me or distortions of diagnostic images or specimens that may result from electronic transmissions. I understand that the practice of medicine is not an exact science and that Practitioner makes no warranties or guarantees regarding treatment outcomes. I acknowledge that a copy of this consent can be made available to me via my patient portal [facility] MyChart), or I can request a printed copy by calling the office of [facility].    I understand that my insurance will be billed for this visit.   I have read or had this consent read to me. I understand the contents of this consent, which adequately explains the benefits and risks of the Services being provided via telemedicine.  I have been provided ample opportunity to ask questions regarding this consent and the Services and have had my questions answered to my satisfaction. I give my informed consent for the services to be provided through the use of telemedicine in my medical care

## 2023-05-29 MED ORDER — APIXABAN 5 MG PO TABS
5.0000 mg | ORAL_TABLET | Freq: Two times a day (BID) | ORAL | 0 refills | Status: DC
Start: 2023-05-29 — End: 2023-12-19

## 2023-05-29 NOTE — Telephone Encounter (Signed)
Patient is calling stating he is needing assistance with filling out the form for eliquis. Please advise.

## 2023-05-29 NOTE — Telephone Encounter (Signed)
Paper Work Dropped Off: MED APPLICATION  Date:05/29/2023  Location of paper: IN [place]INK [place]

## 2023-05-29 NOTE — Telephone Encounter (Signed)
Pt's patient assistance application was scanned to Lynn, LPN email. FYI 

## 2023-05-29 NOTE — Telephone Encounter (Signed)
**Note De-Identified  Obfuscation** I called the pt and assisted him over the phone in completing his BMSPAF application for Eliquis assistance.  He states that he has his proof of income and plans (if he can arrange a ride as he does not drive) to go to his pharmacy today for a print out of his out of pocket RX expense report for 2024, and that he will bring all the the office to drop off.  He is aware that we are leaving him 2 weeks of Eliquis samples in the front office for him to pick up while dropping off his application.  He thanked me for my assistance.

## 2023-05-29 NOTE — Telephone Encounter (Signed)
Leaving pt 2 boxes of Eliquis 5 mg tablets at [facility] front desk for pt to pick up. Thanks Larita Fife, LPN

## 2023-05-30 NOTE — Telephone Encounter (Signed)
I will update the requesting office the pt has a tele pre op appt 06/06/23. Once the pt has been cleared we will fax notes over to Dr. Benancio Deeds, DDS.

## 2023-05-30 NOTE — Telephone Encounter (Signed)
**Note De-Identified  Obfuscation** I have completed the providers page of the pts BMSPAF application for Eliquis assistance and have e-mailed it all to Dr Debby Bud nurse so she can obtain his signature and date while in clinic tomorrow morning and to then fax all to BMSPAF at the fax number written on the cover letter included.

## 2023-05-30 NOTE — Telephone Encounter (Signed)
I s/w the DDS office to confirm simple or surgical extractions. Per the dental office the 4 teeth for extraction will be surgical.   Dr. Benancio Deeds will be the performing DDS.

## 2023-05-31 NOTE — Telephone Encounter (Signed)
MD signature obtained faxed to requested fax.  Confirmation: To:                Recipient at [phone] Subject:          Pt assistance application Result:           The transmission was successful. Explanation:      All Pages Ok Pages Sent:       14

## 2023-06-05 NOTE — Progress Notes (Unsigned)
Virtual Visit via Telephone Note   Because of Lawrence Rose's co-morbid illnesses, he is at least at moderate risk for complications without adequate follow up.  This format is felt to be most appropriate for this patient at this time.  The patient did not have access to video technology/had technical difficulties with video requiring transitioning to audio format only (telephone).  All issues noted in this document were discussed and addressed.  No physical exam could be performed with this format.  Please refer to the patient's chart for his consent to telehealth for [facility].  Evaluation Performed:  Preoperative cardiovascular risk assessment _____________   Date:  06/05/2023   Patient ID:  Lawrence Rose, DOB [birth year], MRN [id number] Patient Location:  Home Provider location:   Office  Primary Care Provider:  Randel Pigg, Dorma Russell, MD Primary Cardiologist:  Christell Constant, MD  Chief Complaint / Patient Profile   67 y.o. y/o male with a h/o aortic stenosis, HFpEF, atrial flutter who is pending four dental extractions and presents today for telephonic preoperative cardiovascular risk assessment.  History of Present Illness    Lawrence Rose is a 67 y.o. male who presents via audio/video conferencing for a telehealth visit today.  Pt was last seen in cardiology clinic on 04/17/2023 by Dr. Excell Seltzer.  At that time Wakeem Bialecki was doing well .  The patient is now pending procedure as outlined above. Since his last visit, he continues to be stable from a cardiac standpoint.  Today he denies chest pain, shortness of breath, lower extremity edema, fatigue, palpitations, melena, hematuria, hemoptysis, diaphoresis, weakness, presyncope, syncope, orthopnea, and PND.   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 Clifton Ned and Dr. Laneta Simmers.    Severe aortic stenosis    Sleep apnea    wears CPAP every night   Past Surgical History:  Procedure Laterality Date   BACK SURGERY     CARDIOVERSION  01/18/2021   CARDIOVERSION N/A 01/18/2021   Procedure: CARDIOVERSION;  Surgeon: Christell Constant, MD;  Location: [facility] ENDOSCOPY;  Service: Cardiovascular;  Laterality: N/A;   ESOPHAGOGASTRODUODENOSCOPY (EGD) WITH PROPOFOL N/A 10/01/2020   Procedure: ESOPHAGOGASTRODUODENOSCOPY (EGD) WITH PROPOFOL;  Surgeon: Napoleon Form, MD;  Location: [facility] ENDOSCOPY;  Service: Endoscopy;  Laterality: N/A;   RIGHT/LEFT HEART CATH AND CORONARY ANGIOGRAPHY N/A 08/18/2020   Procedure: RIGHT/LEFT HEART CATH AND CORONARY ANGIOGRAPHY;  Surgeon: Lyn Records, MD;  Location: [facility] INVASIVE CV LAB;  Service: Cardiovascular;  Laterality: N/A;   TEE WITHOUT CARDIOVERSION N/A 08/21/2020   Procedure: TRANSESOPHAGEAL ECHOCARDIOGRAM (TEE);  Surgeon: Lewayne Bunting, MD;  Location: [facility] ENDOSCOPY;  Service: Cardiovascular;  Laterality: N/A;   TEE WITHOUT CARDIOVERSION N/A 09/01/2020   Procedure: TRANSESOPHAGEAL ECHOCARDIOGRAM (TEE);  Surgeon: Kathleene Hazel, MD;  Location: [facility] INVASIVE CV LAB;  Service: Open Heart Surgery;  Laterality: N/A;  TRANSCATHETER AORTIC VALVE REPLACEMENT, TRANSFEMORAL N/A 09/01/2020   Procedure: TRANSCATHETER AORTIC VALVE REPLACEMENT, TRANSFEMORAL;  Surgeon: Kathleene Hazel, MD;  Location: [facility] INVASIVE CV LAB;  Service: Open Heart Surgery;  Laterality: N/A;    Allergies  Allergies  Allergen Reactions   Cocoa Hives   Sulfamethoxazole-Trimethoprim Anaphylaxis   Chocolate  Hives   Citrus Hives    Reaction to oranges and orange juice   Fish Allergy Hives and Swelling    Throat swelling   Shellfish Allergy Hives and Swelling    Throat swelling   Sulfa Antibiotics Hives and Swelling    Throat swelling   Tetracycline Swelling    Home Medications    Prior to Admission medications   Medication Sig Start Date End Date Taking? Authorizing Provider  albuterol (VENTOLIN HFA) 108 (90 Base) MCG/ACT inhaler Inhale 2 puffs into the lungs every 4 (four) hours as needed for wheezing or shortness of breath. 02/26/21   Tomma Lightning, MD  amiodarone (PACERONE) 200 MG tablet Take 1 tablet by mouth once daily 12/27/22   Riley Lam A, MD  amoxicillin (AMOXIL) 500 MG capsule Take 4 capsules (2,000mg ) 30-60 minutes prior to dental work or cleanings 05/23/23   Tonny Bollman, MD  apixaban (ELIQUIS) 5 MG TABS tablet Take 1 tablet (5 mg total) by mouth 2 (two) times daily. 05/29/23   Christell Constant, MD  B-D UF III MINI PEN NEEDLES 31G X 5 MM MISC Inject into the skin daily.    [provider]  Blood Glucose Monitoring Suppl (FIFTY50 GLUCOSE METER 2.0) w/Device KIT 1 each by Other route in the morning, at noon, and at bedtime. 05/16/19   [provider]  Cholecalciferol 25 MCG (1000 UT) tablet Take 1,000 Units by mouth at bedtime.     [provider]  fluticasone (FLONASE) 50 MCG/ACT nasal spray Place 1 spray into both nostrils as needed for congestion. 12/14/21   [provider]  gabapentin (NEURONTIN) 300 MG capsule Take 600 mg by mouth 2 (two) times daily.  08/12/20   [provider]  glucose blood test strip TEST BLOOD SUGAR EVERY DAY 02/03/23   [provider]  HYDROcodone-acetaminophen (NORCO) 10-325 MG tablet Take 1 tablet by mouth every 6 (six) hours as needed for moderate pain.  08/04/20   [provider]  Iron-Vitamins (GERITOL PO) Take 1 tablet by mouth daily.    [provider]   LANTUS 100 UNIT/ML injection Inject 28 Units into the skin at bedtime. 10/19/22   [provider]  latanoprost (XALATAN) 0.005 % ophthalmic solution Place 1 drop into both eyes at bedtime.  07/27/20   [provider]  metoprolol tartrate (LOPRESSOR) 50 MG tablet Take 1 tablet (50 mg total) by mouth 2 (two) times daily. 10/14/20   Janetta Hora, PA-C  Multiple Vitamin (MULTIVITAMIN WITH MINERALS) TABS tablet Take 1 tablet by mouth at bedtime.     [provider]  pantoprazole (PROTONIX) 40 MG tablet Take 40 mg by mouth 2 (two) times daily. 05/26/20   [provider]  polyethylene glycol (MIRALAX / GLYCOLAX) 17 g packet Take 17 g by mouth daily. 10/05/21   Kathlen Mody, MD  PRECISION QID TEST test strip Use 1 strip to check blood sugar 4 times a day. Please dispense based on availability, insurance coverage, and patient preference. 10/18/22   [provider]  PRESCRIPTION MEDICATION Inhale into the lungs at bedtime. CPAP    [provider]  rosuvastatin (CRESTOR) 20  MG tablet Take 20 mg by mouth daily.  05/25/20   [provider]  RYBELSUS 3 MG TABS Take 1 tablet by mouth every morning. 03/06/23   [provider]  senna-docusate (SENOKOT-S) 8.6-50 MG tablet Take 1 tablet by mouth daily. 10/05/21   Kathlen Mody, MD  Torsemide 40 MG TABS Take 1 tablet by mouth daily. 04/05/23   Christell Constant, MD  TRUEplus Lancets 33G MISC TEST BLOOD SUGAR EVERY DAY 02/03/23   [provider]    Physical Exam    Vital Signs:  Lou Sandborn does not have vital signs available for review today.  Given telephonic nature of communication, physical exam is limited. AAOx3. NAD. Normal affect.  Speech and respirations are unlabored.  Accessory Clinical Findings    None  Assessment & Plan    1.  Preoperative Cardiovascular Risk Assessment: Four dental extractions, [facility], fax #[phone]       Primary Cardiologist: Christell Constant, MD  Chart reviewed as part of pre-operative protocol coverage. Given past medical history and time since last visit, based on ACC/AHA guidelines, Wayland Nudelman would be at acceptable risk for the planned procedure without further cardiovascular testing.   Patient with diagnosis of aflutter on Eliquis for anticoagulation.     Procedure: 4 dental extractions Date of procedure: TBD   CHA2DS2-VASc Score = 4  This indicates a 4.8% annual risk of stroke. The patient's score is based upon: CHF History: 1 HTN History: 1 Diabetes History: 1 Stroke History: 0 Vascular Disease History: 0 Age Score: 1 Gender Score: 0   CrCl 35mL/min using adjusted body weight Platelet count 239K   Patient does require pre-op antibiotics for dental procedure due to history of TAVR. Reviewed med allergies, he has multiple allergies to antibiotics listed, no penicillin allergy noted.    Per office protocol, patient can hold Eliquis for 1 day prior to procedure.   Patient was advised that if he develops new symptoms prior to surgery to contact our office to arrange a follow-up appointment.  He verbalized understanding.  I will route this recommendation to the requesting party via Epic fax function and remove from pre-op pool.  Please call with questions.      Time:   Today, I have spent 8 minutes with the patient with telehealth technology discussing medical history, symptoms, and management plan.  Prior to his phone evaluation I spent greater than 10 minutes reviewing his past medical history and cardiac medications.   Ronney Asters, NP  06/05/2023, 4:53 PM

## 2023-06-06 ENCOUNTER — Ambulatory Visit: Payer: Medicare HMO | Attending: Cardiology

## 2023-06-06 ENCOUNTER — Telehealth: Payer: Self-pay | Admitting: Internal Medicine

## 2023-06-06 ENCOUNTER — Telehealth: Payer: Self-pay | Admitting: Pulmonary Disease

## 2023-06-06 DIAGNOSIS — G4733 Obstructive sleep apnea (adult) (pediatric): Secondary | ICD-10-CM

## 2023-06-06 DIAGNOSIS — Z01810 Encounter for preprocedural cardiovascular examination: Secondary | ICD-10-CM

## 2023-06-06 NOTE — Telephone Encounter (Signed)
Called pharmacy was advised that pharmacy received information from [facility] with the name Lawrence Rose.  Advised pt name is Lawrence Rose must be a typo.  She verified medication Eliquis 5 mg PO BID.  No further needs at this time.

## 2023-06-06 NOTE — Telephone Encounter (Signed)
Call patient  Sleep study result  Date of study: 05/15/2023  Impression: Mild obstructive sleep apnea with optimal pressure was achieved with CPAP therapy  Recommendation:  DME referral  Recommend CPAP therapy for moderate obstructive sleep apnea   Trial of CPAP therapy on 17 cm H2O, C-Flex of 3 with a Large size Resmed Full Face AirFit F20 mask and heated humidification.  Encourage weight loss measures  Follow-up in the office 4 to 6 weeks following initiation of treatment

## 2023-06-06 NOTE — Telephone Encounter (Signed)
**Note De-Identified Hillis Mcphatter Obfuscation** Letter received Lawrence Rose fax from [facility] stating that they have approved the pt for Eliquis assistance until 10/17/2023. [id number]  The letter states that they have notified the pt of this approval as well.

## 2023-06-06 NOTE — Procedures (Signed)
POLYSOMNOGRAPHY  Last, First: Lawrence Rose, Lawrence Rose MRN: [id number] Gender: Male Age (years): 67 Weight (lbs): 340 DOB: Jun 24, 1956 BMI: 47 Primary Care: No PCP Epworth Score: 4 Referring: Tomma Lightning MD Technician: Armen Pickup Interpreting: Tomma Lightning MD Study Type: CPAP Ordered Study Type: CPAP Study date: 05/15/2023 Location: [place] CLINICAL INFORMATION Lawrence Rose is a 67 year old Male and was referred to the sleep center for evaluation of N/A. Indications include Diabetes, Fatigue, Hypertension, Obesity, Snoring.   Most recent polysomnogram dated 03/23/2023 revealed an AHI of 11.3/h and RDI of 13.9/h. MEDICATIONS Patient self administered medications include: N/A. Medications administered during study include No sleep medicine administered.  SLEEP STUDY TECHNIQUE The patient underwent an attended overnight polysomnography titration to assess the effects of CPAP therapy. The following variables were monitored: EEG(C4-A1, C3-A2, O1-A2, O2-A1), EOG, submental and leg EMG, ECG, oxyhemoglobin saturation by pulse oximetry, thoracic and abdominal respiratory effort belts, nasal/oral airflow by pressure sensor, body position sensor and snoring sensor. CPAP pressure was titrated to eliminate apneas, hypopneas and oxygen desaturation. Hypopneas were scored per AASM definition IB (4% desaturation)  TECHNICIAN COMMENTS Comments added by Technician: one restroom visted Comments added by Scorer: N/A SLEEP ARCHITECTURE The study was initiated at 10:07:46 PM and terminated at 4:28:26 AM. Total recorded time was 380.7 minutes. EEG confirmed total sleep time was 298 minutes yielding a sleep efficiency of 78.3%. Sleep onset after lights out was 4.5 minutes with a REM latency of 4.0 minutes. The patient spent 4.4% of the night in stage N1 sleep, 62.4% in stage N2 sleep, 0.0% in stage N3 and 33.2% in REM. The Arousal Index was 8.7/hour. RESPIRATORY PARAMETERS The overall AHI was 6.4 per  hour, and the RDI was 6.8 events/hour with a central apnea index of 0per hour. The most appropriate setting of CPAP was 17 cm H2O. At this setting, the sleep efficiency was 53% and the patient was supine for 98%. The AHI was 0 events per hour, and the RDI was 0 events/hour (with 0 central events) and the arousal index was 5.7 per hour.The oxygen nadir was 83.0% during sleep.  LEG MOVEMENT DATA The total leg movements were 9 with a resulting leg movement index of 1.8/hr. Associated arousal with leg movement index was 0.0/hr. CARDIAC DATA The underlying cardiac rhythm was most consistent with sinus rhythm. Mean heart rate during sleep was 58.3 bpm. Additional rhythm abnormalities include None.  IMPRESSIONS - EKG showed no cardiac abnormalities. - No snoring was audible during this study. - Severe Oxygen Desaturation - Mild Obstructive Sleep apnea(OSA) Optimal pressure attained. - No significant periodic leg movements(PLMs) during sleep. However, no significant associated arousals. - Reduced sleep efficiency, short primary sleep latency, short REM sleep latency and long slow wave latency.  DIAGNOSIS - Obstructive Sleep Apnea (G47.33)  RECOMMENDATIONS - Trial of CPAP therapy on 17 cm H2O, C-Flex of 3 with a Large size Resmed Full Face AirFit F20 mask and heated humidification. - Avoid alcohol, sedatives and other CNS depressants that may worsen sleep apnea and disrupt normal sleep architecture. - Sleep hygiene should be reviewed to assess factors that may improve sleep quality. - Weight management and regular exercise should be initiated or continued. - Return to Sleep Center for re-evaluation after 4 weeks of therapy  [Electronically signed] 06/06/2023 06:33 AM  Lawrence Diamond MD NPI: [id number]

## 2023-06-06 NOTE — Telephone Encounter (Signed)
Office called stating they'd like a callback regarding pt's prescription reading "Lawrence Rose" and not "Reford Norgaard" so they'd like clarity on the names and new prescription with a matching name for what's on file. Please advise

## 2023-06-07 NOTE — Telephone Encounter (Signed)
ATC x1 unable to leave voicemail on patient's phone. Will post result's on my chart.

## 2023-09-01 ENCOUNTER — Telehealth: Payer: Self-pay | Admitting: Internal Medicine

## 2023-09-01 NOTE — Telephone Encounter (Signed)
Pt has received letter from [facility] Patient assistance will end on 10/17/23 for Eliquis

## 2023-12-08 ENCOUNTER — Ambulatory Visit: Payer: Medicare HMO | Admitting: Physician Assistant

## 2023-12-15 ENCOUNTER — Other Ambulatory Visit: Payer: Self-pay | Admitting: Internal Medicine

## 2023-12-19 ENCOUNTER — Telehealth: Payer: Self-pay | Admitting: Internal Medicine

## 2023-12-19 DIAGNOSIS — I4892 Unspecified atrial flutter: Secondary | ICD-10-CM

## 2023-12-19 NOTE — Telephone Encounter (Signed)
Patient calling the office for samples of medication: ° ° °1.  What medication and dosage are you requesting samples for? Eliquis ° °2.  Are you currently out of this medication?  A few ° ° ° °

## 2023-12-19 NOTE — Telephone Encounter (Signed)
 Sample request for Eliquis received. Indication: A Flutter Last office visit: 04/17/23  Dayle Points MD Scr: 2.41 on 08/03/23  Epic Age: 68 Weight: 151kg  Based on above findings Eliquis 5mg  twice daily is the appropriate dose.  OK to provide samples if available.

## 2023-12-21 NOTE — Telephone Encounter (Signed)
 Pt received samples on 05/29/2023 and pt's medication Eliquis has not been sent to pt's pharmacy. Could you please send pt's medication to pt's pharmacy, so we can see how we can best help the pt. Please address

## 2023-12-25 MED ORDER — APIXABAN 5 MG PO TABS: 5.0000 mg | ORAL_TABLET | Freq: Two times a day (BID) | ORAL | Status: DC

## 2023-12-25 MED ORDER — APIXABAN 5 MG PO TABS: 5.0000 mg | ORAL_TABLET | Freq: Two times a day (BID) | ORAL | 3 refills | Status: DC

## 2023-12-25 NOTE — Telephone Encounter (Signed)
 Patient is following up regarding request for samples for Eliquis. He states he spoke with the pharmacy and his prescription will be $255, which he can't afford.

## 2023-12-25 NOTE — Telephone Encounter (Signed)
 Rx for Eliquis sent to pharmacy per request.

## 2023-12-25 NOTE — Addendum Note (Signed)
 Addended by: Margaret Pyle D on: 12/25/2023 12:10 PM   Modules accepted: Orders

## 2023-12-25 NOTE — Telephone Encounter (Signed)
 Called pt to see if pt would like to fill out patient assistance and pt stated that he received an application in the mail. I asked to fill it out and bring to the office and I would leave him 2 weeks of samples of Eliquis 5 mg tablets at [facility] front desk for him to pick up. I advise the pt that if he has any other problems, questions or concerns, to give our office a call back. Pt verbalized understanding.

## 2023-12-27 ENCOUNTER — Telehealth: Payer: Self-pay | Admitting: Internal Medicine

## 2023-12-27 NOTE — Telephone Encounter (Signed)
 Paper Work Dropped Off: [place] patient assistance  Date: 12/27/23     Location of paper:  [facility] [phone]/ Dr Izora Ribas Box

## 2023-12-28 ENCOUNTER — Telehealth: Payer: Self-pay

## 2023-12-28 NOTE — Telephone Encounter (Signed)
 Pt assistance paperwork completed and scan to [employer]. Email.

## 2023-12-28 NOTE — Telephone Encounter (Signed)
 APP RECEIVED, REVIEWING

## 2023-12-28 NOTE — Telephone Encounter (Signed)
 Received, thank you

## 2023-12-29 ENCOUNTER — Telehealth: Payer: Self-pay | Admitting: Internal Medicine

## 2023-12-29 DIAGNOSIS — I4892 Unspecified atrial flutter: Secondary | ICD-10-CM

## 2023-12-29 NOTE — Telephone Encounter (Signed)
Ok to give 2 weeks of samples '5mg'$  BID

## 2023-12-29 NOTE — Telephone Encounter (Signed)
 Patient calling the office for samples of medication:   1.  What medication and dosage are you requesting samples for?   apixaban (ELIQUIS) 5 MG TABS tablet     2.  Are you currently out of this medication? Yes, waiting for patient assistance application

## 2024-01-01 MED ORDER — APIXABAN 5 MG PO TABS: 5.0000 mg | ORAL_TABLET | Freq: Two times a day (BID) | ORAL | Status: DC

## 2024-01-01 NOTE — Telephone Encounter (Signed)
 Called pt to inform him that we were leaving him 2 weeks of samples of Eliquis 5 mg tablets at the front desk [facility] office for him to pick up. I advised the pt that if he has any other problems, questions or concerns, to give our office a call back. Pt verbalized understanding.

## 2024-01-01 NOTE — Telephone Encounter (Signed)
 Missing OOP 3% RX spending report. Letter mailed to patient requesting missing info.

## 2024-01-02 ENCOUNTER — Telehealth: Payer: Self-pay

## 2024-01-02 NOTE — Telephone Encounter (Signed)
 Application received via fax for a Novo product. No drug or doctor was listed on forms, but [facility] doesn't prescribe any insulin/GLP1 prescriptions for patient. Indexed the application to chart media and informed patient to reach out to [facility] to make them aware the app was sent to [place] by accident and is located on his chart media.

## 2024-01-11 ENCOUNTER — Telehealth: Payer: Self-pay | Admitting: Internal Medicine

## 2024-01-11 NOTE — Telephone Encounter (Signed)
 Paper Work Dropped Off: Lawrence Rose  Date:01/11/24  Location of paper:  Dr Flossie Buffy Box

## 2024-01-12 NOTE — Telephone Encounter (Addendum)
 Received OOP.   PAP: Application for Eliquis has been submitted to [employer] ([employer]), via fax. If patient request updates in the meantime, please refer them to [employer] at [phone]

## 2024-01-14 NOTE — Progress Notes (Unsigned)
 Cardiology Office Note    Patient Name: Lawrence Rose Date of Encounter: 01/14/2024  Primary Care Provider:  Randel Pigg, Dorma Russell, MD Primary Cardiologist:  Christell Constant, MD Primary Electrophysiologist: None   Past Medical History    Past Medical History:  Diagnosis Date   Acid reflux    Allergic arthritis, hand    per patient, both hands   Anemia    Apnea    Asthma    Carpal tunnel syndrome    per patient, both hands   Chronic back pain    Chronic kidney disease    Chronic knee pain    Diabetes mellitus without complication (HCC)    Dysrhythmia    GI bleeding 09/2020   Hypertension    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 Clifton Tavari and Dr. Laneta Simmers.    Severe aortic stenosis    Sleep apnea    wears CPAP every night    History of Present Illness  Lawrence Rose is a 68 y.o. male with a PMH of severe AS s/p TAVR 08/2020, mild MS HTN, HLD, OSA (on CPAP), CKD stage III, morbid obesity, anemia, sick euthyroid syndrome who presents today for 33-month follow-up.  Lawrence Rose was seen initially in 08/2020 with complaint of shortness of breath.  He underwent 2D echo that showed EF of 60 to 65% with moderate concentric LVH and trivial MVR with severe aortic valve stenosis and mean gradient of 82 mmHg. He completed R/LHC that showed widely patent coronaries.  He was evaluated by the multidisciplinary valve team and was found to be suitable for TAVR.  He underwent TAVR with 29 mm Medtronic via the TF approach on 09/01/20.  He was seen in [facility] visit and found to be tachycardic and hypotensive with stat labs showing mild AKI and worsening anemia.  He had a 2D echo completed that showed EF of 55% and was seen in follow-up and reported doing well.  He developed GI bleed on 10/02/2020 that required hospitalization.  He was referred to Dr. Excell Seltzer for [facility] but was not interested in  proceeding.  He underwent scheduled DCCV on 01/18/21 and tolerated procedure well but developed fluid overload and was admitted for overnight diuresis and was started on hydralazine 25 mg 3 times daily.  He was admitted on 09/26/2021 with acute on chronic CHF and chest pain.  Patient's troponins were normal and no further ischemic evaluation was completed.  He was also treated for UTI and sepsis with associated AKI and lisinopril was held.  He had 2D echo completed that showed EF of 55 to 60% with stable prosthesis and trivial PVL.  He was last seen by Dr. Raynelle Jan on 10/24/2022 for follow-up.  During visit patient reported that he was doing well and insulin had been adjusted due to recent ED admission for elevated blood sugar of 698. He was seen by Dr. Excell Seltzer on 04/17/2023 to discuss watchman due to cost of anticoagulation.  He was unfortunately found to not be a good candidate at that time due to BMI.  Lawrence Rose presents today for annual follow-up. He reports ongoing shortness of breath, attributed to obesity and deconditioning. 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 He is not currently in a rehabilitation facility but is working on his diet and is on [employer]  0.5 mg, which he started last year. He also takes Lantus insulin and torsemide 40 mg daily. He has concerns about obtaining Eliquis, as he is running out of samples and is in the process of applying for patient assistance. He has not missed any doses yet. He is hesitant to switch to Coumadin due to the need for regular blood checks. No recent episodes of heart racing or fluttering, though he experiences pressure with strenuous activities, which resolves without needing to rest. He has noticed some swelling in his legs, which improves with rest. He is working on reducing salt intake. He has a history of elevated blood sugar, with recent adjustments to his  insulin regimen  Patient denies chest pain, palpitations, dyspnea, PND, orthopnea, nausea, vomiting, dizziness, syncope, edema, weight gain, or early satiety.   Discussed the use of AI scribe software for clinical note transcription with the patient, who gave verbal consent to proceed.  History of Present Illness   Review of Systems  Please see the history of present illness.    All other systems reviewed and are otherwise negative except as noted above.  Physical Exam    Wt Readings from Last 3 Encounters:  05/15/23 (!) 340 lb (154.2 kg)  04/17/23 (!) 333 lb (151 kg)  03/23/23 (!) 330 lb (149.7 kg)   GN:FAOZH were no vitals filed for this visit.,There is no height or weight on file to calculate BMI. GEN: Well nourished, well developed in no acute distress Neck: No JVD; No carotid bruits Pulmonary: Clear to auscultation without rales, wheezing or rhonchi  Cardiovascular: Normal rate. Regular rhythm. Normal S1. Normal S2.   Murmurs: There is no murmur.  ABDOMEN: Soft, non-tender, non-distended EXTREMITIES: Trace lower extremity edema  EKG/LABS/ Recent Cardiac Studies   ECG personally reviewed by me today -none completed today  Risk Assessment/Calculations:    CHA2DS2-VASc Score = 4   This indicates a 4.8% annual risk of stroke. The patient's score is based upon: CHF History: 1 HTN History: 1 Diabetes History: 1 Stroke History: 0 Vascular Disease History: 0 Age Score: 1 Gender Score: 0         Lab Results  Component Value Date   WBC 13.0 (H) 10/04/2021   HGB 8.7 (L) 10/04/2021   HCT 28.4 (L) 10/04/2021   MCV 87.9 10/04/2021   PLT 234 10/04/2021   Lab Results  Component Value Date   CREATININE 2.45 (H) 10/24/2022   BUN 46 (H) 10/24/2022   NA 138 10/24/2022   K 4.1 10/24/2022   CL 94 (L) 10/24/2022   CO2 25 10/24/2022   Lab Results  Component Value Date   CHOL 127 08/17/2020   HDL 39 (L) 08/17/2020   LDLCALC 74 08/17/2020   TRIG 69 08/17/2020    CHOLHDL 3.3 08/17/2020    Lab Results  Component Value Date   HGBA1C 5.2 09/26/2021   Assessment & Plan    1.  HFpEF: -Patient's last 2D echo was completed on 10/26/2022 showing EF of 60-65% with no RWMA and grade 1 DD with mildly dilated LA/RA and trivial MVR with stable aortic prosthesis. -Today patient is euvolemic with trace lower extremity edema present. -NYHA Class II -Continue metoprolol 50 mg twice daily - Continue torsemide 40 mg daily - Advise taking an additional 20 mg of torsemide if weight gain of 2 pounds in 24 hours or 5 pounds in a week is observed  2.  Severe aortic stenosis: -s/p TAVR 08/2020 with most recent 2D echo completed 10/2022  with no perivalvular leak noted - Current echocardiograms indicate a well-functioning prosthesis with trivial pulmonic valve regurgitation, which is not concerning. - Schedule echocardiogram to evaluate valve function  3.  History of atrial flutter: -Patient currently on rate control with amiodarone 200 mg -Order CBC and BMET -Continue Eliquis 5 mg twice daily  4.  Essential hypertension: -Patient's blood pressure today was stable at 138/80 -Continue metoprolol 50 mg twice daily  5.  CKD stage IIIa: -Continue follow-up with nephrology  6. Obesity: Obesity contributing to dyspnea and deconditioning. He is on Ozempic 0.5 mg and has been advised to increase the dose to aid weight loss. Discussed the potential benefits of increasing Ozempic dosage for weight loss, especially in preparation for knee surgery. He is aware of dietary modifications and has previously participated in a weight and wellness program. - Increase Ozempic dose as tolerated - Encourage dietary modifications and calorie reduction - Advise contacting prescriber for assistance with medication titration  Disposition: Follow-up with Christell Constant, MD or APP in 6 months    Signed, Napoleon Form, Leodis Rains, NP 01/14/2024, 4:19 PM [facility]

## 2024-01-16 ENCOUNTER — Other Ambulatory Visit: Payer: Self-pay | Admitting: *Deleted

## 2024-01-16 ENCOUNTER — Telehealth: Payer: Self-pay | Admitting: Internal Medicine

## 2024-01-16 ENCOUNTER — Encounter: Payer: Self-pay | Admitting: Nurse Practitioner

## 2024-01-16 ENCOUNTER — Ambulatory Visit: Payer: Medicare HMO | Attending: Nurse Practitioner | Admitting: Nurse Practitioner

## 2024-01-16 ENCOUNTER — Other Ambulatory Visit: Payer: Self-pay | Admitting: Nurse Practitioner

## 2024-01-16 VITALS — BP 138/80 | HR 70 | Ht 71.0 in | Wt 320.0 lb

## 2024-01-16 DIAGNOSIS — I5032 Chronic diastolic (congestive) heart failure: Secondary | ICD-10-CM

## 2024-01-16 DIAGNOSIS — I4892 Unspecified atrial flutter: Secondary | ICD-10-CM | POA: Diagnosis not present

## 2024-01-16 DIAGNOSIS — G4733 Obstructive sleep apnea (adult) (pediatric): Secondary | ICD-10-CM

## 2024-01-16 DIAGNOSIS — I350 Nonrheumatic aortic (valve) stenosis: Secondary | ICD-10-CM | POA: Diagnosis not present

## 2024-01-16 DIAGNOSIS — N1832 Chronic kidney disease, stage 3b: Secondary | ICD-10-CM

## 2024-01-16 MED ORDER — APIXABAN 5 MG PO TABS: 5.0000 mg | ORAL_TABLET | Freq: Two times a day (BID) | ORAL | 0 refills | Status: DC

## 2024-01-16 MED ORDER — APIXABAN 5 MG PO TABS: 5.0000 mg | ORAL_TABLET | Freq: Two times a day (BID) | ORAL | 1 refills | Status: DC

## 2024-01-16 MED ORDER — TORSEMIDE 40 MG PO TABS: 1.0000 | ORAL_TABLET | Freq: Every day | ORAL | 3 refills | Status: DC

## 2024-01-16 NOTE — Patient Instructions (Signed)
 Medication Instructions:  You can take an additional half tablet as needed for elevated weight gain of 2lbs in day or 5lbs in a week *If you need a refill on your cardiac medications before your next appointment, please call your pharmacy*  Lab Work: TODAY-CMET, LIPIDS & CBC If you have labs (blood work) drawn today and your tests are completely normal, you will receive your results only by: MyChart Message (if you have MyChart) OR A paper copy in the mail If you have any lab test that is abnormal or we need to change your treatment, we will call you to review the results.  Testing/Procedures: Your physician has requested that you have an echocardiogram. Echocardiography is a painless test that uses sound waves to create images of your heart. It provides your doctor with information about the size and shape of your heart and how well your heart's chambers and valves are working. This procedure takes approximately one hour. There are no restrictions for this procedure. Please do NOT wear cologne, perfume, aftershave, or lotions (deodorant is allowed). Please arrive 15 minutes prior to your appointment time.  Please note: We ask at that you not bring children with you during ultrasound (echo/ vascular) testing. Due to room size and safety concerns, children are not allowed in the ultrasound rooms during exams. Our front office staff cannot provide observation of children in our lobby area while testing is being conducted. An adult accompanying a patient to their appointment will only be allowed in the ultrasound room at the discretion of the ultrasound technician under special circumstances. We apologize for any inconvenience.   Follow-Up: At [facility], you and your health needs are our priority.  As part of our continuing mission to provide you with exceptional heart care, our providers are all part of one team.  This team includes your primary Cardiologist (physician) and Advanced  Practice Providers or APPs (Physician Assistants and Nurse Practitioners) who all work together to provide you with the care you need, when you need it.  Your next appointment:   6 month(s)  Provider:   Christell Constant, MD or Robin Searing, NP  We recommend signing up for the patient portal called "MyChart".  Sign up information is provided on this After Visit Summary.  MyChart is used to connect with patients for Virtual Visits (Telemedicine).  Patients are able to view lab/test results, encounter notes, upcoming appointments, etc.  Non-urgent messages can be sent to your provider as well.   To learn more about what you can do with MyChart, go to [email or web address].   Other Instructions       1st Floor: - Lobby - Registration  - Pharmacy  - Lab - Cafe  2nd Floor: - PV Lab - Diagnostic Testing (echo, CT, nuclear med)  3rd Floor: - Vacant  4th Floor: - TCTS (cardiothoracic surgery) - AFib Clinic - Structural Heart Clinic - Vascular Surgery  - Vascular Ultrasound  5th Floor: - HeartCare Cardiology (general and EP) - Clinical Pharmacy for coumadin, hypertension, lipid, weight-loss medications, and med management appointments    Valet parking services will be available as well.

## 2024-01-16 NOTE — Telephone Encounter (Signed)
 Eliquis 5mg  refill request received. Patient is 68 years old, weight-145.2kg, Crea- 2.41 on 08/03/23 via [facility], Diagnosis-Aflutter, and last seen by Robin Searing on 01/16/24. Dose is appropriate based on dosing criteria. Will send in refill to requested pharmacy.

## 2024-01-16 NOTE — Telephone Encounter (Signed)
 Eliquis 5mg  refill request received. Patient is 68 years old, weight-145.2kg, Crea-2.45 on 1/824-needs labs, Diagnosis-Aflutter, and last seen by Robin Searing on 01/16/24. Dose is appropriate based on dosing criteria.

## 2024-01-16 NOTE — Telephone Encounter (Signed)
 Someone sent over his OOP a few days ago and we faxed his application. Thank you Shamea!

## 2024-01-16 NOTE — Telephone Encounter (Signed)
 Pt c/o medication issue:  1. Name of Medication: apixaban (ELIQUIS) 5 MG TABS tablet   2. How are you currently taking this medication (dosage and times per day)? As written  3. Are you having a reaction (difficulty breathing--STAT)? No   4. What is your medication issue? Pt states we need to send script to [facility]

## 2024-01-17 LAB — CBC
Hematocrit: 41 % (ref 37.5–51.0)
Hemoglobin: 12.5 g/dL — ABNORMAL LOW (ref 13.0–17.7)
MCH: 25.9 pg — ABNORMAL LOW (ref 26.6–33.0)
MCHC: 30.5 g/dL — ABNORMAL LOW (ref 31.5–35.7)
MCV: 85 fL (ref 79–97)
Platelets: 205 10*3/uL (ref 150–450)
RBC: 4.82 x10E6/uL (ref 4.14–5.80)
RDW: 13 % (ref 11.6–15.4)
WBC: 6.6 10*3/uL (ref 3.4–10.8)

## 2024-01-17 LAB — COMPREHENSIVE METABOLIC PANEL WITH GFR
ALT: 17 IU/L (ref 0–44)
AST: 24 IU/L (ref 0–40)
Albumin: 4.2 g/dL (ref 3.9–4.9)
Alkaline Phosphatase: 94 IU/L (ref 44–121)
BUN/Creatinine Ratio: 10 (ref 10–24)
BUN: 21 mg/dL (ref 8–27)
Bilirubin Total: 0.5 mg/dL (ref 0.0–1.2)
CO2: 29 mmol/L (ref 20–29)
Calcium: 9.4 mg/dL (ref 8.6–10.2)
Chloride: 98 mmol/L (ref 96–106)
Creatinine, Ser: 2.03 mg/dL — ABNORMAL HIGH (ref 0.76–1.27)
Globulin, Total: 3.5 g/dL (ref 1.5–4.5)
Glucose: 107 mg/dL — ABNORMAL HIGH (ref 70–99)
Potassium: 4.6 mmol/L (ref 3.5–5.2)
Sodium: 142 mmol/L (ref 134–144)
Total Protein: 7.7 g/dL (ref 6.0–8.5)
eGFR: 35 mL/min/{1.73_m2} — ABNORMAL LOW (ref >59)

## 2024-01-17 LAB — LIPID PANEL
Chol/HDL Ratio: 3.2 ratio (ref 0.0–5.0)
Cholesterol, Total: 120 mg/dL (ref 100–199)
HDL: 37 mg/dL — ABNORMAL LOW (ref >39)
LDL Chol Calc (NIH): 59 mg/dL (ref 0–99)
Triglycerides: 134 mg/dL (ref 0–149)
VLDL Cholesterol Cal: 24 mg/dL (ref 5–40)

## 2024-01-18 ENCOUNTER — Other Ambulatory Visit (HOSPITAL_COMMUNITY): Payer: Self-pay

## 2024-01-18 NOTE — Telephone Encounter (Signed)
 PAP denial for Eliquis, FYI

## 2024-01-18 NOTE — Telephone Encounter (Signed)
 Based on test claims, Xarelto would be the same tier on plan as the Eliquis so same high cost. Pradaxa is non formulary on patient's plan. If patient picks up the Eliquis fill at pharmacy, they will meet their $250 deductible and they should have $47 copay's going forward. If patient can't afford the current copay, they likely would benefit from setting up a medicare payment plan with his insurance company.

## 2024-01-18 NOTE — Telephone Encounter (Signed)
   I scanned the denial in media and sending it here for you

## 2024-01-18 NOTE — Telephone Encounter (Signed)
 Called pt to f/u.  He reports was approved for pt assistance through [facility]; picked up Eliquis from the pharmacy yesterday.

## 2024-02-20 ENCOUNTER — Ambulatory Visit (HOSPITAL_COMMUNITY): Attending: Cardiology

## 2024-02-20 DIAGNOSIS — I350 Nonrheumatic aortic (valve) stenosis: Secondary | ICD-10-CM | POA: Insufficient documentation

## 2024-02-20 DIAGNOSIS — N1832 Chronic kidney disease, stage 3b: Secondary | ICD-10-CM | POA: Diagnosis present

## 2024-02-20 DIAGNOSIS — I5032 Chronic diastolic (congestive) heart failure: Secondary | ICD-10-CM | POA: Insufficient documentation

## 2024-02-20 DIAGNOSIS — I4892 Unspecified atrial flutter: Secondary | ICD-10-CM | POA: Diagnosis not present

## 2024-02-20 DIAGNOSIS — G4733 Obstructive sleep apnea (adult) (pediatric): Secondary | ICD-10-CM | POA: Insufficient documentation

## 2024-02-20 LAB — ECHOCARDIOGRAM COMPLETE
AR max vel: 1.5 cm2
AV Area VTI: 1.45 cm2
AV Area mean vel: 1.28 cm2
AV Mean grad: 24 mmHg
AV Peak grad: 39.2 mmHg
Ao pk vel: 3.13 m/s
Area-P 1/2: 3.89 cm2
S' Lateral: 3.6 cm

## 2024-02-20 MED ORDER — PERFLUTREN LIPID MICROSPHERE: 1.0000 mL | INTRAVENOUS | Status: AC | PRN

## 2024-02-27 ENCOUNTER — Ambulatory Visit: Payer: Self-pay

## 2024-03-10 ENCOUNTER — Other Ambulatory Visit: Payer: Self-pay | Admitting: Internal Medicine

## 2024-05-02 ENCOUNTER — Telehealth: Payer: Self-pay

## 2024-05-02 NOTE — Telephone Encounter (Signed)
   Pre-operative Risk Assessment    Patient Name: Lawrence Rose  DOB: [birth year] MRN: [id number]   Date of last office visit: 01/16/24 [name], NP Date of next office visit: 07/11/24 [facility], MD   Request for Surgical Clearance    Procedure:  COLONOSCOPY  Date of Surgery:  Clearance TBD                                Surgeon:  DR JAMA Surgeon's Group or Practice Name:  [facility] Phone number:  [phone] Fax number:  [phone]   Type of Clearance Requested:   - Medical  - Pharmacy:  Hold Apixaban  (Eliquis ) 2 DAYS PRIOR   Type of Anesthesia:  Not Indicated   Additional requests/questions:    Signed, Lucie DELENA Ku   05/02/2024, 10:09 AM

## 2024-05-07 NOTE — Telephone Encounter (Signed)
 Patient with diagnosis of aflutter on Eliquis  for anticoagulation.    Procedure: colonoscopy Date of procedure: TBD   CHA2DS2-VASc Score = 4   This indicates a 4.8% annual risk of stroke. The patient's score is based upon: CHF History: 1 HTN History: 1 Diabetes History: 1 Stroke History: 0 Vascular Disease History: 0 Age Score: 1 Gender Score: 0      CrCl 49 ml/min Platelet count 205  Patient has not had an Afib/aflutter ablation within the last 3 months or DCCV within the last 30 days  Per office protocol, patient can hold Eliquis  for 2 days prior to procedure.    **This guidance is not considered finalized until pre-operative APP has relayed final recommendations.**

## 2024-05-08 ENCOUNTER — Telehealth: Payer: Self-pay

## 2024-05-08 NOTE — Telephone Encounter (Signed)
 Patient has been scheduled for televisit med rec and consent done     Patient Consent for Virtual Visit         Lawrence Rose has provided verbal consent on 05/08/2024 for a virtual visit (video or telephone).   CONSENT FOR VIRTUAL VISIT FOR:  Lawrence Rose  By participating in this virtual visit I agree to the following:  I hereby voluntarily request, consent and authorize [facility] and its employed or contracted physicians, physician assistants, nurse practitioners or other licensed health care professionals (the Practitioner), to provide me with telemedicine health care services (the "Services) as deemed necessary by the treating Practitioner. I acknowledge and consent to receive the Services by the Practitioner via telemedicine. I understand that the telemedicine visit will involve communicating with the Practitioner through live audiovisual communication technology and the disclosure of certain medical information by electronic transmission. I acknowledge that I have been given the opportunity to request an in-person assessment or other available alternative prior to the telemedicine visit and am voluntarily participating in the telemedicine visit.  I understand that I have the right to withhold or withdraw my consent to the use of telemedicine in the course of my care at any time, without affecting my right to future care or treatment, and that the Practitioner or I may terminate the telemedicine visit at any time. I understand that I have the right to inspect all information obtained and/or recorded in the course of the telemedicine visit and may receive copies of available information for a reasonable fee.  I understand that some of the potential risks of receiving the Services via telemedicine include:  Delay or interruption in medical evaluation due to technological equipment failure or disruption; Information transmitted may not be sufficient (e.g. poor resolution of  images) to allow for appropriate medical decision making by the Practitioner; and/or  In rare instances, security protocols could fail, causing a breach of personal health information.  Furthermore, I acknowledge that it is my responsibility to provide information about my medical history, conditions and care that is complete and accurate to the best of my ability. I acknowledge that Practitioner's advice, recommendations, and/or decision may be based on factors not within their control, such as incomplete or inaccurate data provided by me or distortions of diagnostic images or specimens that may result from electronic transmissions. I understand that the practice of medicine is not an exact science and that Practitioner makes no warranties or guarantees regarding treatment outcomes. I acknowledge that a copy of this consent can be made available to me via my patient portal [facility] MyChart), or I can request a printed copy by calling the office of [facility].    I understand that my insurance will be billed for this visit.   I have read or had this consent read to me. I understand the contents of this consent, which adequately explains the benefits and risks of the Services being provided via telemedicine.  I have been provided ample opportunity to ask questions regarding this consent and the Services and have had my questions answered to my satisfaction. I give my informed consent for the services to be provided through the use of telemedicine in my medical care

## 2024-05-08 NOTE — Telephone Encounter (Signed)
   Name: Lawrence Rose  DOB: September 01, 1956  MRN: [id number]  Primary Cardiologist: Stanly DELENA Leavens, MD   Preoperative team, please contact this patient and set up a phone call appointment for further preoperative risk assessment. Please obtain consent and complete medication review. Thank you for your help.Last seen by Jackee Alberts, NP on 01/16/2024  I confirm that guidance regarding antiplatelet and oral anticoagulation therapy has been completed and, if necessary, noted below.  CHA2DS2-VASc Score = 4   This indicates a 4.8% annual risk of stroke. The patient's score is based upon: CHF History: 1 HTN History: 1 Diabetes History: 1 Stroke History: 0 Vascular Disease History: 0 Age Score: 1 Gender Score: 0       CrCl 49 ml/min Platelet count 205   Patient has not had an Afib/aflutter ablation within the last 3 months or DCCV within the last 30 days   Per office protocol, patient can hold Eliquis  for 2 days prior to procedure.    I also confirmed the patient resides in the state of [place] . As per [facility] Medical Board telemedicine laws, the patient must reside in the state in which the provider is licensed.   Lamarr Satterfield, NP 05/08/2024, 7:42 AM [facility]

## 2024-05-08 NOTE — Telephone Encounter (Signed)
Patient has been scheduled for televisit.

## 2024-05-10 NOTE — Telephone Encounter (Signed)
 Spoke with the pt and he mentioned that he is getting the ozempic from [facility].and confirmed. Muna

## 2024-05-14 ENCOUNTER — Ambulatory Visit: Attending: Internal Medicine | Admitting: Student

## 2024-05-14 DIAGNOSIS — Z01810 Encounter for preprocedural cardiovascular examination: Secondary | ICD-10-CM

## 2024-05-14 NOTE — Progress Notes (Signed)
 Virtual Visit via Telephone Note   Because of Lawrence Rose's co-morbid illnesses, he is at least at moderate risk for complications without adequate follow up.  This format is felt to be most appropriate for this patient at this time.  The patient did not have access to video technology/had technical difficulties with video requiring transitioning to audio format only (telephone).  All issues noted in this document were discussed and addressed.  No physical exam could be performed with this format.  Please refer to the patient's chart for his consent to telehealth for [facility].  Evaluation Performed:  Preoperative cardiovascular risk assessment _____________   Date:  05/14/2024   Patient ID:  Lawrence Rose, DOB [birth year], MRN [id number] Patient Location:  Home Provider location:   Office  Primary Care Provider:  Nikki Rams, Aliene, MD Primary Cardiologist:  Stanly DELENA Leavens, MD  Chief Complaint / Patient Profile   68 y.o. y/o male with a h/o chronic HFpEF/pulmonary hypertension, aortic stenosis s/p TAVR November 2021, paroxysmal a-flutter on anticoagulation, hypertension, OSA, T2DM, CKD stage III who is pending colonoscopy by Dr. Jama and presents today for telephonic preoperative cardiovascular risk assessment.  History of Present Illness    Lawrence Rose is a 68 y.o. male who presents via audio/video conferencing for a telehealth visit today.  Pt was last seen in cardiology clinic on 01/16/2024 by Lawrence Alberts, NP.  At that time Lawrence Rose was stable from a cardiac standpoint.  The patient is now pending procedure as outlined above. Since his last visit, he is doing well. Patient denies shortness of breath, dyspnea on exertion, lower extremity edema, orthopnea or PND. No chest pain, pressure, or tightness. No palpitations. His activity is limited. He is independent with ADLs and able to perform light household activities.    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 Verlin and Dr. Lucas.    Severe aortic stenosis    Sleep apnea    wears CPAP every night   Past Surgical History:  Procedure Laterality Date   BACK SURGERY     CARDIOVERSION  01/18/2021   CARDIOVERSION N/A 01/18/2021   Procedure: CARDIOVERSION;  Surgeon: Leavens Stanly DELENA, MD;  Location: [facility] ENDOSCOPY;  Service: Cardiovascular;  Laterality: N/A;   ESOPHAGOGASTRODUODENOSCOPY (EGD) WITH PROPOFOL  N/A 10/01/2020   Procedure: ESOPHAGOGASTRODUODENOSCOPY (EGD) WITH PROPOFOL ;  Surgeon: Shila Gustav GAILS, MD;  Location: [facility] ENDOSCOPY;  Service: Endoscopy;  Laterality: N/A;   RIGHT/LEFT HEART CATH AND CORONARY ANGIOGRAPHY N/A 08/18/2020   Procedure: RIGHT/LEFT HEART CATH AND CORONARY ANGIOGRAPHY;  Surgeon: Claudene Victory ORN, MD;  Location: [facility] INVASIVE CV LAB;  Service: Cardiovascular;  Laterality: N/A;   TEE WITHOUT CARDIOVERSION N/A 08/21/2020   Procedure: TRANSESOPHAGEAL ECHOCARDIOGRAM (TEE);  Surgeon: Pietro Redell RAMAN, MD;  Location: [facility] ENDOSCOPY;  Service: Cardiovascular;  Laterality: N/A;   TEE WITHOUT CARDIOVERSION N/A 09/01/2020  Procedure: TRANSESOPHAGEAL ECHOCARDIOGRAM (TEE);  Surgeon: Verlin Lonni BIRCH, MD;  Location: [facility] INVASIVE CV LAB;  Service: Open Heart Surgery;  Laterality: N/A;   TRANSCATHETER AORTIC VALVE REPLACEMENT, TRANSFEMORAL N/A 09/01/2020   Procedure: TRANSCATHETER AORTIC VALVE REPLACEMENT, TRANSFEMORAL;  Surgeon: Verlin Lonni BIRCH, MD;  Location: [facility] INVASIVE CV LAB;   Service: Open Heart Surgery;  Laterality: N/A;    Allergies  Allergies  Allergen Reactions   Cocoa Hives   Sulfamethoxazole-Trimethoprim Anaphylaxis   Chocolate Hives   Citrus Hives    Reaction to oranges and orange juice   Fish Allergy Hives and Swelling    Throat swelling   Shellfish Allergy Hives and Swelling    Throat swelling   Sulfa Antibiotics Hives and Swelling    Throat swelling   Tetracycline Swelling    Home Medications    Prior to Admission medications   Medication Sig Start Date End Date Taking? Authorizing Provider  albuterol  (VENTOLIN  HFA) 108 (90 Base) MCG/ACT inhaler Inhale 2 puffs into the lungs every 4 (four) hours as needed for wheezing or shortness of breath. 02/26/21   Neda Jennet LABOR, MD  amiodarone  (PACERONE ) 200 MG tablet Take 1 tablet by mouth once daily 03/12/24   Santo Kelly A, MD  amoxicillin  (AMOXIL ) 500 MG capsule Take 4 capsules (2,000mg ) 30-60 minutes prior to dental work or cleanings 05/23/23   Wonda Sharper, MD  apixaban  (ELIQUIS ) 5 MG TABS tablet Take 1 tablet (5 mg total) by mouth 2 (two) times daily. 01/16/24   Wyn Lawrence VEAR Mickey., NP  B-D UF III MINI PEN NEEDLES 31G X 5 MM MISC Inject into the skin daily.    [provider]  Blood Glucose Monitoring Suppl (FIFTY50 GLUCOSE METER 2.0) w/Device KIT 1 each by Other route in the morning, at noon, and at bedtime. 05/16/19   [provider]  Cholecalciferol  25 MCG (1000 UT) tablet Take 1,000 Units by mouth at bedtime.     [provider]  fluticasone (FLONASE) 50 MCG/ACT nasal spray Place 1 spray into both nostrils as needed for congestion. 12/14/21   [provider]  gabapentin  (NEURONTIN ) 300 MG capsule Take 600 mg by mouth 2 (two) times daily.  08/12/20   [provider]  glucose blood test strip TEST BLOOD SUGAR EVERY DAY 02/03/23   [provider]  HYDROcodone -acetaminophen  (NORCO) 10-325 MG tablet Take 1 tablet by mouth every 6 (six)  hours as needed for moderate pain.  08/04/20   [provider]  Iron-Vitamins (GERITOL PO) Take 1 tablet by mouth daily.    [provider]  LANTUS 100 UNIT/ML injection Inject 28 Units into the skin at bedtime. 10/19/22   [provider]  latanoprost  (XALATAN ) 0.005 % ophthalmic solution Place 1 drop into both eyes at bedtime.  07/27/20   [provider]  metoprolol  tartrate (LOPRESSOR ) 50 MG tablet Take 1 tablet (50 mg total) by mouth 2 (two) times daily. 10/14/20   Sebastian Lamarr SAUNDERS, PA-C  Multiple Vitamin (MULTIVITAMIN WITH MINERALS) TABS tablet Take 1 tablet by mouth at bedtime.     [provider]  pantoprazole  (PROTONIX ) 40 MG tablet Take 40 mg by mouth 2 (two) times daily. 05/26/20   [provider]  polyethylene glycol (MIRALAX  / GLYCOLAX ) 17 g packet Take 17 g by mouth daily. 10/05/21   Akula, Vijaya, MD  PRECISION QID TEST test strip Use 1 strip to check blood sugar 4 times a day. Please dispense based on availability, insurance coverage, and patient  preference. 10/18/22   [provider]  PRESCRIPTION MEDICATION Inhale into the lungs at bedtime. CPAP    [provider]  rosuvastatin  (CRESTOR ) 20 MG tablet Take 20 mg by mouth daily.  05/25/20   [provider]  RYBELSUS 3 MG TABS Take 1 tablet by mouth every morning. 03/06/23   [provider]  senna-docusate (SENOKOT-S) 8.6-50 MG tablet Take 1 tablet by mouth daily. 10/05/21   Akula, Vijaya, MD  Torsemide  40 MG TABS Take 1 tablet by mouth daily. Can take an additional half tab for weight gain 01/16/24   Wyn Lawrence VEAR Mickey., NP  TRUEplus Lancets 33G MISC TEST BLOOD SUGAR EVERY DAY 02/03/23   [provider]    Physical Exam    Vital Signs:  Wolfe Camarena does not have vital signs available for review today.  Given telephonic nature of communication, physical exam is limited. AAOx3. NAD. Normal affect.  Speech and respirations are  unlabored.   Assessment & Plan    Primary Cardiologist: Stanly DELENA Leavens, MD  Preoperative cardiovascular risk assessment.  Colonoscopy by Dr. Jama.  Chart reviewed as part of pre-operative protocol coverage. According to the RCRI, patient has a 11% risk of MACE. Patient reports activity equivalent to 4.3 METS (per DASI).   Given past medical history and time since last visit, based on ACC/AHA guidelines, Yacob Wilkerson would be at acceptable risk for the planned procedure without further cardiovascular testing.   Patient was advised that if he develops new symptoms prior to surgery to contact our office to arrange a follow-up appointment.  he verbalized understanding.  Per Pharm D, patient has not had an Afib/aflutter ablation within the last 3 months or DCCV within the last 30 days. Patient may hold Eliquis  for 2 days prior to procedure.      I will route this recommendation to the requesting party via Epic fax function.  Please call with questions.  Time:   Today, I have spent 6 minutes with the patient with telehealth technology discussing medical history, symptoms, and management plan.     Barnie Hila, NP  05/14/2024, 7:45 AM

## 2024-05-17 NOTE — Telephone Encounter (Signed)
 2nd clearance request received.   Will fax over preop clearance to surgeons office.

## 2024-07-11 ENCOUNTER — Ambulatory Visit: Attending: Internal Medicine | Admitting: Internal Medicine

## 2024-07-11 VITALS — BP 119/74 | HR 66 | Ht 70.0 in | Wt 338.0 lb

## 2024-07-11 DIAGNOSIS — I350 Nonrheumatic aortic (valve) stenosis: Secondary | ICD-10-CM | POA: Diagnosis not present

## 2024-07-11 DIAGNOSIS — Z952 Presence of prosthetic heart valve: Secondary | ICD-10-CM

## 2024-07-11 DIAGNOSIS — G4733 Obstructive sleep apnea (adult) (pediatric): Secondary | ICD-10-CM | POA: Diagnosis not present

## 2024-07-11 DIAGNOSIS — I4892 Unspecified atrial flutter: Secondary | ICD-10-CM | POA: Diagnosis not present

## 2024-07-11 DIAGNOSIS — I5030 Unspecified diastolic (congestive) heart failure: Secondary | ICD-10-CM

## 2024-07-11 DIAGNOSIS — Z79899 Other long term (current) drug therapy: Secondary | ICD-10-CM

## 2024-07-11 NOTE — Progress Notes (Signed)
 Cardiology Office Note:  .    Date:  07/11/2024  ID:  Lawrence Rose, DOB 05-Jan-1956, MRN [id number] PCP: Nikki Rams, Aliene, MD  [facility] Providers Cardiologist:  Stanly DELENA Leavens, MD     CC: Follow up AS   History of Present Illness: .    Lawrence Rose is a 68 y.o. male with a hx of HTN, HLD, Morbid Obesity, OSA on CPAP, CKD Stage III, Atrial flutter on eliquis  with recent GI bleed initially since inpatient with severe aortic stenosis s/p 09/01/20 TAVR.  2021: inpatient TAVR, Post TAVR was diagnosed with AFl and put on eliquis  and amiodarone .  This leg to GI bleed with 10/02/20 hospitalization.   2022: s/p DCCV and HF decompensation admission. 2023: AF burden improved. Had low BP at last visit that improved. 2024: PFTs improved.   Lawrence Rose is a 68 year old male with morbid obesity, paroxysmal atrial flutter, and HFpEF who presents for cardiovascular follow-up. He has a history of morbid obesity, managed with GLP-1 therapy for weight loss. He is on blood thinners, and his blood pressure is monitored to remain under control. He has paroxysmal atrial flutter and is on amiodarone  and anticoagulation therapy. He is chronically on amiodarone , and therapeutic monitoring is ongoing. Liver function tests are normal, but he requires a thyroid-stimulating hormone test. He is scheduled to get labs with his primary care doctor today, including a complete blood count. He has a history of heart failure with preserved ejection fraction and underwent a transcatheter aortic valve replacement procedure. Pulmonary function tests have improved as of 2024, and he is doing well on his current therapies. He has type 2 diabetes and chronic kidney disease stage 3, which are managed by nephrology. His kidney function has shown improvement overall. He has a history of hyperlipidemia. He also has a history of anemia, with hemoglobin levels that improved in April. He requires antibiotics prior  to dental work due to his TAVR.  He now takes hyrdalazine but does not know the dosing.  Relevant histories: .  Social [employer] Maurine always comes with his friend, Shamea's Cousin  ROS: As per HPI.   Studies Reviewed: .     Cardiac Studies & Procedures   ______________________________________________________________________________________________ CARDIAC CATHETERIZATION  CARDIAC CATHETERIZATION 08/18/2020  Conclusion  Widely patent coronary arteries  High cardiac output: 9.06 L/min by Fick and 9.23 L/min by thermal dilution.  Etiology uncertain.  Suppressed TSH with normal T4 raising question of T3 thyrotoxicosis versus other mechanism.  Severe pulmonary hypertension, mean pressure 48 mmHg, based upon hemodynamics WHO group 2  Pulmonary capillary wedge pressure mean 34 mmHg, pulmonary vascular resistance 1.55 Woods units.  Mean right atrial pressure 19 mmHg  Aortic valve gradient peak to peak 33 mmHg with mean gradient 22 mmHg.  Aortic valve area 1.94 cm by Fick cardiac output and 1.98 cm by thermodilution  LV function normal by echocardiography with moderate LVH  RECOMMENDATIONS:   Further work-up/management per treating team.  Needs further diuresis.  T3 level  Findings Coronary Findings Diagnostic  Dominance: Right  Left Anterior Descending The vessel exhibits minimal luminal irregularities.  Left Circumflex Mid Cx to Dist Cx lesion is 25% stenosed.  Right Coronary Artery The vessel exhibits minimal luminal irregularities. Prox RCA lesion is 30% stenosed.  Intervention  No interventions have been documented.     ECHOCARDIOGRAM  ECHOCARDIOGRAM COMPLETE 02/20/2024  Narrative ECHOCARDIOGRAM REPORT    [name] Date of Exam: 02/20/2024 Medical Rec #:  [id number]      Height:       71.0 in Accession #:    [id number]     Weight:       320.0 lb Date of Birth:  [birth year]      BSA:          2.575 m Patient Age:    68 years        BP:           160/83 mmHg Patient Gender: M              HR:           69 bpm. Exam Location:  [address]  Procedure: 2D Echo, Cardiac Doppler and Color Doppler (Both Spectral and Color Flow Doppler were utilized during procedure).  Indications:    I50.32 CHF  History:        Patient has prior history of Echocardiogram examinations, most recent 10/26/2022. CHF, CKD, Arrythmias:Atrial Flutter, Signs/Symptoms:Shortness of Breath; Risk Factors:Sleep Apnea. Aortic Valve: 29 mm stented (TAVR) valve is present in the aortic position. Procedure Date: 09/01/2020.  Sonographer:    Waldo Guadalajara RCS Referring Phys: [phone] JACKEE DEL, JR DICK  IMPRESSIONS   1. Left ventricular ejection fraction, by estimation, is 65 to 70%. The left ventricle has hyperdynamic function. The left ventricle has no regional wall motion abnormalities. Left ventricular diastolic parameters are consistent with Grade I diastolic dysfunction (impaired relaxation). 2. Right ventricular systolic function is normal. The right ventricular size is not well visualized. There is normal pulmonary artery systolic pressure. The estimated right ventricular systolic pressure is 23.8 mmHg. 3. Left atrial size was mildly dilated. 4. The mitral valve is normal in structure. No evidence of mitral valve regurgitation. No evidence of mitral stenosis. 5. The aortic valve has been repaired/replaced. Aortic valve regurgitation is not visualized. There is a 29 mm stented (TAVR) valve present in the aortic position. Procedure Date: 09/01/2020. Echo findings are consistent with normal structure and function of the aortic valve prosthesis. Aortic valve mean gradient measures 24.0 mmHg. Aortic valve Vmax measures 3.13 m/s. Aortic valve acceleration time measures 77 msec. 6. The inferior vena cava is normal in size with greater than 50% respiratory variability, suggesting right atrial pressure of 3 mmHg.  Comparison(s): Aortic prosthesis gradients  have increased. This may be partly explained by hyperdynamic left ventricular state (consider anemia, thyrotoxicosis, etc.), but the dimensionless valve index is also lower, suggesting true worsening of valve resistance. The ejection jet remains early peaking. The previously described perivalvular leak was not identified on this study.  FINDINGS Left Ventricle: Left ventricular ejection fraction, by estimation, is 65 to 70%. The left ventricle has hyperdynamic function. The left ventricle has no regional wall motion abnormalities. The left ventricular internal cavity size was normal in size. There is no left ventricular hypertrophy. Left ventricular diastolic parameters are consistent with Grade I diastolic dysfunction (impaired relaxation). Normal left ventricular filling pressure.  Right Ventricle: The right ventricular size is not well visualized. Right vetricular wall thickness was not well visualized. Right ventricular systolic function is normal. There is normal pulmonary artery systolic pressure. The tricuspid regurgitant velocity is 2.28 m/s, and with an assumed right atrial pressure of 3 mmHg, the estimated right ventricular systolic pressure is 23.8 mmHg.  Left Atrium: Left atrial size was mildly dilated.  Right Atrium: Right atrial size was normal in size.  Pericardium: There is no evidence of pericardial effusion.  Mitral Valve: The mitral valve is normal in structure.  Mild to moderate mitral annular calcification. No evidence of mitral valve regurgitation. No evidence of mitral valve stenosis.  Tricuspid Valve: The tricuspid valve is normal in structure. Tricuspid valve regurgitation is trivial.  Aortic Valve: The aortic valve has been repaired/replaced. Aortic valve regurgitation is not visualized. Aortic valve mean gradient measures 24.0 mmHg. Aortic valve peak gradient measures 39.2 mmHg. Aortic valve area, by VTI measures 1.45 cm. There is a 29 mm stented (TAVR) valve present  in the aortic position. Procedure Date: 09/01/2020. Echo findings are consistent with normal structure and function of the aortic valve prosthesis.  Pulmonic Valve: The pulmonic valve was grossly normal. Pulmonic valve regurgitation is not visualized. No evidence of pulmonic stenosis.  Aorta: The aortic root and ascending aorta are structurally normal, with no evidence of dilitation.  Venous: The inferior vena cava is normal in size with greater than 50% respiratory variability, suggesting right atrial pressure of 3 mmHg.  IAS/Shunts: No atrial level shunt detected by color flow Doppler.   LEFT VENTRICLE PLAX 2D LVIDd:         5.50 cm   Diastology LVIDs:         3.60 cm   LV e' medial:    7.94 cm/s LV PW:         0.90 cm   LV E/e' medial:  13.5 LV IVS:        0.70 cm   LV e' lateral:   8.16 cm/s LVOT diam:     2.50 cm   LV E/e' lateral: 13.1 LV SV:         111 LV SV Index:   43 LVOT Area:     4.91 cm   RIGHT VENTRICLE RV Basal diam:  3.40 cm RV S prime:     15.30 cm/s TAPSE (M-mode): 2.6 cm RVSP:           23.8 mmHg  LEFT ATRIUM             Index        RIGHT ATRIUM           Index LA diam:        3.90 cm 1.51 cm/m   RA Pressure: 3.00 mmHg LA Vol (A2C):   70.3 ml 27.30 ml/m  RA Area:     12.60 cm LA Vol (A4C):   53.4 ml 20.74 ml/m  RA Volume:   27.20 ml  10.56 ml/m LA Biplane Vol: 62.6 ml 24.31 ml/m AORTIC VALVE AV Area (Vmax):    1.50 cm AV Area (Vmean):   1.28 cm AV Area (VTI):     1.45 cm AV Vmax:           313.00 cm/s AV Vmean:          237.000 cm/s AV VTI:            0.765 m AV Peak Grad:      39.2 mmHg AV Mean Grad:      24.0 mmHg LVOT Vmax:         95.80 cm/s LVOT Vmean:        61.900 cm/s LVOT VTI:          0.226 m LVOT/AV VTI ratio: 0.30  AORTA Ao Root diam: 3.00 cm Ao Asc diam:  3.00 cm  MITRAL VALVE                TRICUSPID VALVE MV Area (PHT):  TR Peak grad:   20.8 mmHg MV Decel Time:              TR Vmax:        228.00 cm/s MV  E velocity: 107.00 cm/s  Estimated RAP:  3.00 mmHg MV A velocity: 133.00 cm/s  RVSP:           23.8 mmHg MV E/A ratio:  0.80 SHUNTS Systemic VTI:  0.23 m Systemic Diam: 2.50 cm  Jerel Balding MD Electronically signed by Jerel Balding MD Signature Date/Time: 02/20/2024/2:44:39 PM    Final   TEE  ECHO TEE 08/21/2020  Narrative TRANSESOPHOGEAL ECHO REPORT    [name] Date of Exam: 08/21/2020 Medical Rec #:  [id number]      Height:       71.0 in Accession #:    [id number]     Weight:       303.2 lb Date of Birth:  1956/05/22      BSA:          2.516 m Patient Age:    68 years       BP:           149/75 mmHg Patient Gender: M              HR:           87 bpm. Exam Location:  Inpatient  Procedure: Transesophageal Echo, Cardiac Doppler and Color Doppler  Indications:     I35.0 Nonrheumatic aortic (valve) stenosis  History:         Patient has prior history of Echocardiogram examinations, most recent 08/17/2020. Previous Myocardial Infarction and CAD, Aortic Valve Disease; Risk Factors:Sleep Apnea. Severe aortic stenosis.  Sonographer:     Ellouise Mose RDCS Referring Phys:  [id number] LAMARR SAUNDERS THOMPSON Diagnosing Phys: Redell Shallow MD  PROCEDURE: After discussion of the risks and benefits of a TEE, an informed consent was obtained from the patient. The transesophogeal probe was passed without difficulty through the esophogus of the patient. Imaged were obtained with the patient in a left lateral decubitus position. Sedation performed by different physician. The patient was monitored while under deep sedation. Anesthestetic sedation was provided intravenously by Anesthesiology: 300mg  of Propofol . The patient's vital signs; including heart rate, blood pressure, and oxygen  saturation; remained stable throughout the procedure. The patient developed no complications during the procedure.  IMPRESSIONS   1. Severe AS (mean gradient 42 mmHg and peak velocity of 4.2  m/s); mild AI. 2. Left ventricular ejection fraction, by estimation, is 70 to 75%. The left ventricle has hyperdynamic function. There is severe left ventricular hypertrophy. 3. Right ventricular systolic function is normal. The right ventricular size is normal. 4. Left atrial size was moderately dilated. No left atrial/left atrial appendage thrombus was detected. 5. The mitral valve is normal in structure. Trivial mitral valve regurgitation. 6. The aortic valve is tricuspid. Aortic valve regurgitation is mild. Severe aortic valve stenosis. 7. There is Moderate (Grade III) plaque involving the descending aorta.  FINDINGS Left Ventricle: Left ventricular ejection fraction, by estimation, is 70 to 75%. The left ventricle has hyperdynamic function. The left ventricular internal cavity size was normal in size. There is severe left ventricular hypertrophy.  Right Ventricle: The right ventricular size is normal. Right vetricular wall thickness was not assessed. Right ventricular systolic function is normal.  Left Atrium: Left atrial size was moderately dilated. No left atrial/left atrial appendage thrombus was detected.  Right Atrium: Right atrial size  was normal in size.  Pericardium: There is no evidence of pericardial effusion.  Mitral Valve: The mitral valve is normal in structure. Mild mitral annular calcification. Trivial mitral valve regurgitation. MV peak gradient, 5.8 mmHg. The mean mitral valve gradient is 2.0 mmHg.  Tricuspid Valve: The tricuspid valve is normal in structure. Tricuspid valve regurgitation is mild.  Aortic Valve: The aortic valve is tricuspid. Aortic valve regurgitation is mild. Severe aortic stenosis is present. Aortic valve mean gradient measures 41.5 mmHg. Aortic valve peak gradient measures 66.7 mmHg. Aortic valve area, by VTI measures 1.04 cm.  Pulmonic Valve: The pulmonic valve was normal in structure. Pulmonic valve regurgitation is not visualized.  Aorta: The  aortic root is normal in size and structure. There is moderate (Grade III) plaque involving the descending aorta.  IAS/Shunts: No atrial level shunt detected by color flow Doppler.  Additional Comments: Severe AS (mean gradient 42 mmHg and peak velocity of 4.2 m/s); mild AI.   LEFT VENTRICLE PLAX 2D LVOT diam:     2.20 cm LV SV:         81 LV SV Index:   32 LVOT Area:     3.80 cm   AORTIC VALVE AV Area (Vmax):    1.01 cm AV Area (Vmean):   0.92 cm AV Area (VTI):     1.04 cm AV Vmax:           408.50 cm/s AV Vmean:          307.500 cm/s AV VTI:            0.778 m AV Peak Grad:      66.7 mmHg AV Mean Grad:      41.5 mmHg LVOT Vmax:         108.00 cm/s LVOT Vmean:        74.800 cm/s LVOT VTI:          0.214 m LVOT/AV VTI ratio: 0.27  MITRAL VALVE MV Peak grad: 5.8 mmHg  SHUNTS MV Mean grad: 2.0 mmHg  Systemic VTI:  0.21 m MV Vmax:      1.20 m/s  Systemic Diam: 2.20 cm MV Vmean:     60.7 cm/s  Redell Shallow MD Electronically signed by Redell Shallow MD Signature Date/Time: 08/21/2020/9:21:38 AM    Final    CT SCANS  CT CORONARY MORPH W/CTA COR W/SCORE 08/19/2020  Addendum 08/20/2020  8:04 AM ADDENDUM REPORT: 08/20/2020 08:01  EXAM: OVER-READ INTERPRETATION  CT CHEST  The following report is an over-read performed by radiologist Dr. Toribio Cove [facility] on 08/20/2020. This over-read does not include interpretation of cardiac or coronary anatomy or pathology. The coronary calcium  score and cardiac CTA interpretation by the cardiologist is attached.  COMPARISON:  Chest CTA 08/15/2020.  FINDINGS: Extracardiac findings will be described separately under dictation for contemporaneously obtained CTA chest, abdomen and pelvis.  IMPRESSION: Please see separate dictation for contemporaneously obtained CTA chest, abdomen and pelvis 08/19/2020 for full description of relevant extracardiac findings.   Electronically Signed By: Toribio Aye M.D. On: 08/20/2020 08:01  Narrative MEDICATIONS: MEDICATIONS None  EXAM: Cardiac TAVR CT  TECHNIQUE: The patient was scanned on a Siemens Force 192 slice scanner. A 120 kV retrospective scan was triggered in the descending thoracic aorta at 111 HU's. Gantry rotation speed was 270 msecs and collimation was .9 mm. No beta blockade or nitro were given. The 3D data set was reconstructed in 5% intervals of the R-R cycle. Systolic and diastolic  phases were analyzed on a dedicated work station using MPR, MIP and VRT modes. The patient received 80 cc of contrast.  FINDINGS: Aortic Valve: Calcium  score 1645 Tri leaflet with restricted motion  Aorta: No aneurysm moderate calcific atherosclerosis normal arch vessels  Sino-tubular Junction: 25 mm  Ascending Thoracic Aorta: 33 mm  Aortic Arch: 25 mm  Descending Thoracic Aorta: 25 mm  Sinus of Valsalva Measurements:  Non-coronary: 30.8 mm  Right - coronary: 28.2 mm  Left - coronary: 31.3 mm  Coronary Artery Height above Annulus:  Left Main: 11.1 mm above annulus  Right Coronary: 15.3 mm above annulus  Virtual Basal Annulus Measurements:  Maximum/Minimum Diameter: 27.4 mm x 22.8 mm  Perimeter: 82 mm  Area: 505 mm2  Coronary Arteries: Sufficient height above annulus for deployment  Optimum Fluoroscopic Angle for Delivery: LAO 3 Caudal 6 degrees  IMPRESSION: 1. Tri-leaflet AV with calcium  score 1645 and annular area of 505 mm2 suitable for a 26 mm Sapien 3 valve  2.  Coronary arteries sufficient height above annulus for deployment  3.  Optimum angiographic angle for deployment LAO 3 Caudal 6 degrees  4. Some nodular calcification in annulus at base of left coronary cusp  5.  Normal aortic root 3.3 cm  Maude Emmer  Electronically Signed: By: Maude Emmer M.D. On: 08/19/2020 18:00     ______________________________________________________________________________________________        Physical Exam:    VS:  BP 119/74 (BP Location: Left Arm, Cuff Size: Large)   Pulse 66   Ht 5' 10 (1.778 m)   Wt (!) 338 lb (153.3 kg)   BMI 48.50 kg/m    Wt Readings from Last 3 Encounters:  07/11/24 (!) 338 lb (153.3 kg)  01/16/24 (!) 320 lb (145.2 kg)  05/15/23 (!) 340 lb (154.2 kg)    Gen: no distress, morbid obesity   Neck: No JVD Cardiac: No Rubs or Gallops, systolic Murmur, RRR +2 radial pulses Respiratory: Clear to auscultation bilaterally, normal effort, normal  respiratory rate GI: Soft, nontender, non-distended  MS: Trace bilateral edema;  moves all extremities Integument: Skin feels warm Neuro:  At time of evaluation, alert and oriented to person/place/time/situation  Psych: Normal affect, patient feels ok   ASSESSMENT AND PLAN: .     An EKG was ordered for AFL and shows SR  Paroxysmal atrial flutter Paroxysmal atrial flutter managed with amiodarone  and anticoagulation. Chronic amiodarone  therapy requires therapeutic monitoring. LFTs are well-managed, but TSH needs monitoring. - Ensure TSH is included in labs with PCP - Continue amiodarone  and anticoagulation as prescribed  Heart failure with preserved ejection fraction (HFpEF) status post TAVR HFpEF status post TAVR. He is asymptomatic and doing well on current therapies. - Schedule echocardiogram for May 2026; this should be done with strain; given CKD, AS, and HF I would order nuclear scintigraphy for ATTR-AC testing as per protocol  Pulmonary hypertension (group 2 and 3) related to morbid obesity Pulmonary hypertension related to morbid obesity. Pulmonary function tests have improved since 2024. Current management includes GLP-1 therapy for weight loss, managed by PCP. Blood pressure control is essential, and hydralazine  is taken as prescribed by PCP. - Continue GLP-1 therapy for weight loss - Monitor blood pressure and adjust hydralazine  as needed - Notify if blood pressure increases for  reevaluation  Chronic kidney disease (CKD) stage 3 CKD stage 3 managed by nephrology. Kidney function has improved overall. - Coordinate with nephrology for CKD management  Anemia Anemia with improved hemoglobin levels as of  April. Monitoring through PCP is ongoing. - Ensure CBC is included in labs with PCP  Hyperlipidemia Hyperlipidemia with cholesterol at goal. Managed by PCP. - Continue current management with PCP  Longitudinal care: The evaluation and management services provided today reflect the complexity inherent in caring for this patient, including the ongoing longitudinal relationship and management of multiple chronic conditions and/or the need for care coordination. The visit required a comprehensive assessment and management plan tailored to the patient's unique needs Time was spent addressing not only the acute concerns but also the broader context of the patient's health, including preventive care, chronic disease management, and care coordination as appropriate.  Complex longitudinal is necessary for conditions including:  support of TAVR and AFL care in patient with CKD and BMI of 49   Stanly Leavens, MD FASE [facility] Cardiologist [facility]  [address] [place], [address] [place], [place] [address] [phone]  9:51 AM

## 2024-07-11 NOTE — Patient Instructions (Signed)
 Medication Instructions:  Your physician recommends that you continue on your current medications as directed. Please refer to the Current Medication list given to you today.  *If you need a refill on your cardiac medications before your next appointment, please call your pharmacy*  Lab Work: CBC and TSH at [facility]   If you have labs (blood work) drawn today and your tests are completely normal, you will receive your results only by: MyChart Message (if you have MyChart) OR A paper copy in the mail If you have any lab test that is abnormal or we need to change your treatment, we will call you to review the results.  Testing/Procedures: MAY 2026- - - Your physician has requested that you have an echocardiogram. Echocardiography is a painless test that uses sound waves to create images of your heart. It provides your doctor with information about the size and shape of your heart and how well your heart's chambers and valves are working. This procedure takes approximately one hour. There are no restrictions for this procedure. Please do NOT wear cologne, perfume, aftershave, or lotions (deodorant is allowed). Please arrive 15 minutes prior to your appointment time.  Please note: We ask at that you not bring children with you during ultrasound (echo/ vascular) testing. Due to room size and safety concerns, children are not allowed in the ultrasound rooms during exams. Our front office staff cannot provide observation of children in our lobby area while testing is being conducted. An adult accompanying a patient to their appointment will only be allowed in the ultrasound room at the discretion of the ultrasound technician under special circumstances. We apologize for any inconvenience.   Follow-Up: At [facility], you and your health needs are our priority.  As part of our continuing mission to provide you with exceptional heart care, our providers are all part of one team.  This team  includes your primary Cardiologist (physician) and Advanced Practice Providers or APPs (Physician Assistants and Nurse Practitioners) who all work together to provide you with the care you need, when you need it.  Your next appointment:   1 year(s)  Provider:   Stanly DELENA Leavens, MD

## 2024-07-12 ENCOUNTER — Ambulatory Visit: Payer: Self-pay

## 2024-07-12 LAB — CBC
Hematocrit: 40.5 % (ref 37.5–51.0)
Hemoglobin: 12.6 g/dL — ABNORMAL LOW (ref 13.0–17.7)
MCH: 27.5 pg (ref 26.6–33.0)
MCHC: 31.1 g/dL — ABNORMAL LOW (ref 31.5–35.7)
MCV: 88 fL (ref 79–97)
Platelets: 200 x10E3/uL (ref 150–450)
RBC: 4.59 x10E6/uL (ref 4.14–5.80)
RDW: 13.5 % (ref 11.6–15.4)
WBC: 8.4 x10E3/uL (ref 3.4–10.8)

## 2024-07-12 LAB — TSH: TSH: 0.745 u[IU]/mL (ref 0.450–4.500)

## 2024-07-16 ENCOUNTER — Other Ambulatory Visit: Payer: Self-pay | Admitting: Nurse Practitioner

## 2024-07-16 DIAGNOSIS — I4892 Unspecified atrial flutter: Secondary | ICD-10-CM

## 2024-07-16 NOTE — Telephone Encounter (Signed)
 Prescription refill request for Eliquis  received. Indication: A Flutter Last office visit: 07/11/24  CHRISTELLA Leavens MD Scr: 2.53 on 07/11/24  Epic Age: 68 Weight: 153.3kg  Based on above findings Eliquis  5mg  twice daily is the appropriate dose.  Refill approved.

## 2024-11-19 ENCOUNTER — Other Ambulatory Visit: Payer: Self-pay | Admitting: Nurse Practitioner

## 2025-02-26 ENCOUNTER — Other Ambulatory Visit (HOSPITAL_COMMUNITY)
